# Patient Record
Sex: Female | Born: 1940 | Race: White | Hispanic: No | Smoking: Never smoker
Health system: Southern US, Community
[De-identification: ages and names within clinical notes are randomized; demographics above are authoritative.]

## PROBLEM LIST (undated history)

## (undated) DIAGNOSIS — I639 Cerebral infarction, unspecified: Secondary | ICD-10-CM

## (undated) DIAGNOSIS — IMO0001 Reserved for inherently not codable concepts without codable children: Secondary | ICD-10-CM

## (undated) DIAGNOSIS — I10 Essential (primary) hypertension: Secondary | ICD-10-CM

## (undated) DIAGNOSIS — K219 Gastro-esophageal reflux disease without esophagitis: Secondary | ICD-10-CM

## (undated) DIAGNOSIS — I2510 Atherosclerotic heart disease of native coronary artery without angina pectoris: Secondary | ICD-10-CM

## (undated) DIAGNOSIS — F0280 Dementia in other diseases classified elsewhere without behavioral disturbance: Secondary | ICD-10-CM

## (undated) DIAGNOSIS — G309 Alzheimer's disease, unspecified: Secondary | ICD-10-CM

## (undated) DIAGNOSIS — M1990 Unspecified osteoarthritis, unspecified site: Secondary | ICD-10-CM

## (undated) DIAGNOSIS — R262 Difficulty in walking, not elsewhere classified: Secondary | ICD-10-CM

## (undated) HISTORY — DX: Cerebral infarction, unspecified: I63.9

## (undated) HISTORY — PX: APPENDECTOMY: SHX54

## (undated) HISTORY — DX: Difficulty in walking, not elsewhere classified: R26.2

## (undated) HISTORY — PX: FINGER DEBRIDEMENT: SHX1634

---

## 2011-09-18 DIAGNOSIS — J3089 Other allergic rhinitis: Secondary | ICD-10-CM | POA: Diagnosis not present

## 2011-09-18 DIAGNOSIS — F329 Major depressive disorder, single episode, unspecified: Secondary | ICD-10-CM | POA: Diagnosis not present

## 2011-10-29 DIAGNOSIS — I10 Essential (primary) hypertension: Secondary | ICD-10-CM | POA: Diagnosis not present

## 2011-10-29 DIAGNOSIS — I700 Atherosclerosis of aorta: Secondary | ICD-10-CM | POA: Diagnosis not present

## 2011-11-04 DIAGNOSIS — IMO0001 Reserved for inherently not codable concepts without codable children: Secondary | ICD-10-CM | POA: Diagnosis not present

## 2011-11-04 DIAGNOSIS — E018 Other iodine-deficiency related thyroid disorders and allied conditions: Secondary | ICD-10-CM | POA: Diagnosis not present

## 2011-11-05 DIAGNOSIS — E119 Type 2 diabetes mellitus without complications: Secondary | ICD-10-CM | POA: Diagnosis not present

## 2011-11-05 DIAGNOSIS — N390 Urinary tract infection, site not specified: Secondary | ICD-10-CM | POA: Diagnosis not present

## 2011-11-05 DIAGNOSIS — I10 Essential (primary) hypertension: Secondary | ICD-10-CM | POA: Diagnosis not present

## 2011-11-05 DIAGNOSIS — E1169 Type 2 diabetes mellitus with other specified complication: Secondary | ICD-10-CM | POA: Diagnosis not present

## 2011-11-21 ENCOUNTER — Observation Stay (HOSPITAL_COMMUNITY)
Admission: EM | Admit: 2011-11-21 | Discharge: 2011-11-22 | DRG: 639 | Disposition: A | Discharge status: Home / Self Care | Payer: Medicare Other | Attending: Internal Medicine | Admitting: Internal Medicine

## 2011-11-21 ENCOUNTER — Encounter (HOSPITAL_COMMUNITY): Payer: Self-pay | Admitting: *Deleted

## 2011-11-21 ENCOUNTER — Other Ambulatory Visit: Payer: Self-pay

## 2011-11-21 DIAGNOSIS — Z79899 Other long term (current) drug therapy: Secondary | ICD-10-CM | POA: Diagnosis not present

## 2011-11-21 DIAGNOSIS — E118 Type 2 diabetes mellitus with unspecified complications: Secondary | ICD-10-CM | POA: Diagnosis not present

## 2011-11-21 DIAGNOSIS — E039 Hypothyroidism, unspecified: Secondary | ICD-10-CM | POA: Diagnosis present

## 2011-11-21 DIAGNOSIS — R000 Tachycardia, unspecified: Secondary | ICD-10-CM | POA: Diagnosis not present

## 2011-11-21 DIAGNOSIS — I10 Essential (primary) hypertension: Secondary | ICD-10-CM | POA: Diagnosis present

## 2011-11-21 DIAGNOSIS — G309 Alzheimer's disease, unspecified: Secondary | ICD-10-CM | POA: Diagnosis present

## 2011-11-21 DIAGNOSIS — T383X5A Adverse effect of insulin and oral hypoglycemic [antidiabetic] drugs, initial encounter: Secondary | ICD-10-CM | POA: Diagnosis present

## 2011-11-21 DIAGNOSIS — F0280 Dementia in other diseases classified elsewhere without behavioral disturbance: Secondary | ICD-10-CM | POA: Diagnosis not present

## 2011-11-21 DIAGNOSIS — E1169 Type 2 diabetes mellitus with other specified complication: Principal | ICD-10-CM | POA: Diagnosis present

## 2011-11-21 DIAGNOSIS — I498 Other specified cardiac arrhythmias: Secondary | ICD-10-CM | POA: Diagnosis not present

## 2011-11-21 DIAGNOSIS — E1165 Type 2 diabetes mellitus with hyperglycemia: Secondary | ICD-10-CM | POA: Diagnosis not present

## 2011-11-21 DIAGNOSIS — F0390 Unspecified dementia without behavioral disturbance: Secondary | ICD-10-CM | POA: Diagnosis present

## 2011-11-21 DIAGNOSIS — E860 Dehydration: Secondary | ICD-10-CM | POA: Diagnosis not present

## 2011-11-21 DIAGNOSIS — E162 Hypoglycemia, unspecified: Secondary | ICD-10-CM

## 2011-11-21 DIAGNOSIS — E119 Type 2 diabetes mellitus without complications: Secondary | ICD-10-CM | POA: Diagnosis present

## 2011-11-21 DIAGNOSIS — I2510 Atherosclerotic heart disease of native coronary artery without angina pectoris: Secondary | ICD-10-CM | POA: Diagnosis present

## 2011-11-21 HISTORY — DX: Essential (primary) hypertension: I10

## 2011-11-21 HISTORY — DX: Atherosclerotic heart disease of native coronary artery without angina pectoris: I25.10

## 2011-11-21 HISTORY — DX: Dementia in other diseases classified elsewhere, unspecified severity, without behavioral disturbance, psychotic disturbance, mood disturbance, and anxiety: F02.80

## 2011-11-21 HISTORY — DX: Gastro-esophageal reflux disease without esophagitis: K21.9

## 2011-11-21 HISTORY — DX: Reserved for inherently not codable concepts without codable children: IMO0001

## 2011-11-21 HISTORY — DX: Unspecified osteoarthritis, unspecified site: M19.90

## 2011-11-21 HISTORY — DX: Alzheimer's disease, unspecified: G30.9

## 2011-11-21 LAB — CREATININE, SERUM
GFR calc Af Amer: 73 mL/min — ABNORMAL LOW (ref >90)
GFR calc non Af Amer: 63 mL/min — ABNORMAL LOW (ref >90)

## 2011-11-21 LAB — URINALYSIS, ROUTINE W REFLEX MICROSCOPIC
Glucose, UA: NEGATIVE mg/dL
Hgb urine dipstick: NEGATIVE
Protein, ur: NEGATIVE mg/dL
Specific Gravity, Urine: >1.03 — ABNORMAL HIGH (ref 1.005–1.030)

## 2011-11-21 LAB — BASIC METABOLIC PANEL
CO2: 26 mEq/L (ref 19–32)
Chloride: 96 mEq/L (ref 96–112)
GFR calc non Af Amer: 54 mL/min — ABNORMAL LOW (ref >90)
Glucose, Bld: 114 mg/dL — ABNORMAL HIGH (ref 70–99)
Potassium: 4 mEq/L (ref 3.5–5.1)
Sodium: 135 mEq/L (ref 135–145)

## 2011-11-21 LAB — CBC
HCT: 41.6 % (ref 36.0–46.0)
Hemoglobin: 14 g/dL (ref 12.0–15.0)
Hemoglobin: 12.5 g/dL (ref 12.0–15.0)
MCH: 28.7 pg (ref 26.0–34.0)
MCH: 27.9 pg (ref 26.0–34.0)
MCV: 85.4 fL (ref 78.0–100.0)
MCV: 85 fL (ref 78.0–100.0)
RBC: 4.87 MIL/uL (ref 3.87–5.11)
RBC: 4.48 MIL/uL (ref 3.87–5.11)
WBC: 9.6 10*3/uL (ref 4.0–10.5)

## 2011-11-21 LAB — GLUCOSE, CAPILLARY
Glucose-Capillary: 95 mg/dL (ref 70–99)
Glucose-Capillary: 53 mg/dL — ABNORMAL LOW (ref 70–99)
Glucose-Capillary: 194 mg/dL — ABNORMAL HIGH (ref 70–99)

## 2011-11-21 LAB — URINE MICROSCOPIC-ADD ON

## 2011-11-21 LAB — D-DIMER, QUANTITATIVE: D-Dimer, Quant: 0.5 ug/mL-FEU — ABNORMAL HIGH (ref 0.00–0.48)

## 2011-11-21 LAB — DIFFERENTIAL
Eosinophils Absolute: 0.1 10*3/uL (ref 0.0–0.7)
Lymphs Abs: 2 10*3/uL (ref 0.7–4.0)
Monocytes Relative: 5 % (ref 3–12)
Neutrophils Relative %: 78 % — ABNORMAL HIGH (ref 43–77)

## 2011-11-21 LAB — LACTIC ACID, PLASMA: Lactic Acid, Venous: 1.2 mmol/L (ref 0.5–2.2)

## 2011-11-21 MED ORDER — ONDANSETRON HCL 4 MG PO TABS: 4.0000 mg | ORAL_TABLET | Freq: Four times a day (QID) | ORAL | Status: DC | PRN

## 2011-11-21 MED ORDER — LEVOTHYROXINE SODIUM 50 MCG PO TABS
50.0000 ug | ORAL_TABLET | Freq: Every day | ORAL | Status: DC
  Administered 2011-11-22: 50 ug via ORAL
  Filled 2011-11-21: qty 1

## 2011-11-21 MED ORDER — SODIUM CHLORIDE 0.9 % IJ SOLN: 3.0000 mL | Freq: Two times a day (BID) | INTRAMUSCULAR | Status: DC

## 2011-11-21 MED ORDER — CLOPIDOGREL BISULFATE 75 MG PO TABS
75.0000 mg | ORAL_TABLET | Freq: Every day | ORAL | Status: DC
  Administered 2011-11-22: 75 mg via ORAL
  Filled 2011-11-21: qty 1

## 2011-11-21 MED ORDER — DEXTROSE 50 % IV SOLN
25.0000 g | Freq: Once | INTRAVENOUS | Status: AC
  Administered 2011-11-21: 25 g via INTRAVENOUS

## 2011-11-21 MED ORDER — DEXTROSE 50 % IV SOLN
INTRAVENOUS | Status: AC
  Filled 2011-11-21: qty 50

## 2011-11-21 MED ORDER — PROPRANOLOL HCL 20 MG PO TABS
40.0000 mg | ORAL_TABLET | Freq: Two times a day (BID) | ORAL | Status: DC
  Administered 2011-11-21: 40 mg via ORAL
  Filled 2011-11-21: qty 2

## 2011-11-21 MED ORDER — LISINOPRIL 10 MG PO TABS: 40.0000 mg | ORAL_TABLET | Freq: Every day | ORAL | Status: DC

## 2011-11-21 MED ORDER — SODIUM CHLORIDE 0.9 % IV BOLUS (SEPSIS)
500.0000 mL | Freq: Once | INTRAVENOUS | Status: AC
  Administered 2011-11-21: 500 mL via INTRAVENOUS

## 2011-11-21 MED ORDER — ACETAMINOPHEN 650 MG RE SUPP: 650.0000 mg | Freq: Four times a day (QID) | RECTAL | Status: DC | PRN

## 2011-11-21 MED ORDER — PANTOPRAZOLE SODIUM 40 MG PO TBEC: 80.0000 mg | DELAYED_RELEASE_TABLET | Freq: Every day | ORAL | Status: DC

## 2011-11-21 MED ORDER — METOPROLOL TARTRATE 1 MG/ML IV SOLN
5.0000 mg | Freq: Four times a day (QID) | INTRAVENOUS | Status: DC | PRN
  Administered 2011-11-21: 5 mg via INTRAVENOUS
  Filled 2011-11-21: qty 5

## 2011-11-21 MED ORDER — ONDANSETRON HCL 4 MG/2ML IJ SOLN: 4.0000 mg | Freq: Four times a day (QID) | INTRAMUSCULAR | Status: DC | PRN

## 2011-11-21 MED ORDER — ENOXAPARIN SODIUM 40 MG/0.4ML ~~LOC~~ SOLN
40.0000 mg | SUBCUTANEOUS | Status: DC
  Administered 2011-11-21: 40 mg via SUBCUTANEOUS
  Filled 2011-11-21: qty 0.4

## 2011-11-21 MED ORDER — ACETAMINOPHEN 325 MG PO TABS: 650.0000 mg | ORAL_TABLET | Freq: Four times a day (QID) | ORAL | Status: DC | PRN

## 2011-11-21 MED ORDER — DEXTROSE-NACL 5-0.9 % IV SOLN
INTRAVENOUS | Status: DC
  Administered 2011-11-21: 19:00:00 via INTRAVENOUS

## 2011-11-21 NOTE — ED Notes (Signed)
Crackers and water given to pt.  Ambulated pt to bathroom without difficulty.

## 2011-11-21 NOTE — H&P (Signed)
PCP:   No primary provider on file.   Chief Complaint:  Hypoglycemia  HPI: This is a very pleasant 71 year old female with past medical history diabetes, hypertension, coronary artery disease, dementia, hypothyroidism. Patient has recently moved down here from [place]. She does not have a primary care physician at this time. She was noted to be lethargic at home. Her blood sugar was checked and found to be low in the 50s. He subsequently trended down to the 30s. She is brought to the ER for evaluation and received intravenous dextrose. Her blood sugar initially improved but then subsequently began to drop again. She denies any fever, dysuria, vomiting, diarrhea. Her family reports that she was recently admitted in [place] approximately 2 weeks ago for a urinary tract infection. She was also noted to have high sugars at that time. They are not sure whether any new diabetic medicines were started at that time. The patient otherwise does not have any other complaints. She will be admitted for observation for hypoglycemia.  Allergies:  No Known Allergies    Past Medical History  Diagnosis Date  . Diabetes mellitus   . Hypertension   . Reflux   . Coronary artery disease   . Alzheimer disease   . Arthritis     History reviewed. No pertinent past surgical history.  Prior to Admission medications   Medication Sig Start Date End Date Taking? Authorizing Provider  clopidogrel (PLAVIX) 75 MG tablet Take 75 mg by mouth daily.   Yes Historical Provider, MD  esomeprazole (NEXIUM) 40 MG capsule Take 40 mg by mouth daily.   Yes Historical Provider, MD  glimepiride (AMARYL) 4 MG tablet Take 4 mg by mouth 3 (three) times daily.   Yes Historical Provider, MD  levothyroxine (SYNTHROID, LEVOTHROID) 50 MCG tablet Take 50 mcg by mouth daily.   Yes Historical Provider, MD  lisinopril (PRINIVIL,ZESTRIL) 40 MG tablet Take 40 mg by mouth daily.   Yes Historical Provider, MD  Multiple Vitamins-Minerals  (MULTIVITAMIN WITH MINERALS) tablet Take 1 tablet by mouth daily.   Yes Historical Provider, MD  nateglinide (STARLIX) 120 MG tablet Take 120 mg by mouth 3 (three) times daily before meals.   Yes Historical Provider, MD  potassium chloride SA (K-DUR,KLOR-CON) 20 MEQ tablet Take 20 mEq by mouth daily.   Yes Historical Provider, MD  propranolol (INDERAL) 40 MG tablet Take 40 mg by mouth 2 (two) times daily.   Yes Historical Provider, MD  sitaGLIPtan-metformin (JANUMET) 50-1000 MG per tablet Take 1 tablet by mouth 2 (two) times daily.   Yes Historical Provider, MD    Social History:  reports that she has never smoked. She does not have any smokeless tobacco history on file. She reports that she does not drink alcohol or use illicit drugs.  History reviewed. No pertinent family history.  Review of Systems:  Constitutional: Denies fever, chills, diaphoresis, appetite change and fatigue.  HEENT: Denies photophobia, eye pain, redness, hearing loss, ear pain, congestion, sore throat, rhinorrhea, sneezing, mouth sores, trouble swallowing, neck pain, neck stiffness and tinnitus.   Respiratory: Denies SOB, DOE, cough, chest tightness,  and wheezing.   Cardiovascular: Denies chest pain, palpitations and leg swelling.  Gastrointestinal: Denies nausea, vomiting, abdominal pain, diarrhea, constipation, blood in stool and abdominal distention.  Genitourinary: Denies dysuria, urgency, frequency, hematuria, flank pain and difficulty urinating.  Musculoskeletal: Denies myalgias, back pain, joint swelling, arthralgias and gait problem.  Skin: Denies pallor, rash and wound.  Neurological: Denies dizziness, seizures, syncope, weakness, light-headedness, numbness  and headaches.  Hematological: Denies adenopathy. Easy bruising, personal or family bleeding history  Psychiatric/Behavioral: Denies suicidal ideation, mood changes, confusion, nervousness, sleep disturbance and agitation   Physical Exam: Blood pressure  109/78, pulse 133, temperature 98.3 F (36.8 C), temperature source Oral, resp. rate 16, SpO2 100.00%. General: Patient is sitting up in a chair, does not appear to be in any acute distress, HEENT: Normocephalic, atraumatic, mucous membranes are moist Chest: Clear to auscultation bilaterally Cardiac: S1, S2, tachycardic Abdomen: Soft, nontender, nondistended, bowel sounds are active Extremities: No cyanosis, clubbing, or edema Neurologic: Grossly intact, nonfocal   Labs on Admission:  Results for orders placed during the hospital encounter of 11/21/11 (from the past 48 hour(s))  GLUCOSE, CAPILLARY     Status: Normal   Collection Time   11/21/11  1:06 PM      Component Value Range Comment   Glucose-Capillary 85  70 - 99 (mg/dL)    Comment 1 Notify RN     BASIC METABOLIC PANEL     Status: Abnormal   Collection Time   11/21/11  1:49 PM      Component Value Range Comment   Sodium 135  135 - 145 (mEq/L)    Potassium 4.0  3.5 - 5.1 (mEq/L)    Chloride 96  96 - 112 (mEq/L)    CO2 26  19 - 32 (mEq/L)    Glucose, Bld 114 (*) 70 - 99 (mg/dL)    BUN 28 (*) 6 - 23 (mg/dL)    Creatinine, Ser 4.69  0.50 - 1.10 (mg/dL)    Calcium 9.7  8.4 - 10.5 (mg/dL)    GFR calc non Af Amer 54 (*) >90 (mL/min)    GFR calc Af Amer 62 (*) >90 (mL/min)   CBC     Status: Abnormal   Collection Time   11/21/11  1:49 PM      Component Value Range Comment   WBC 13.1 (*) 4.0 - 10.5 (K/uL)    RBC 4.87  3.87 - 5.11 (MIL/uL)    Hemoglobin 14.0  12.0 - 15.0 (g/dL)    HCT 62.9  52.8 - 41.3 (%)    MCV 85.4  78.0 - 100.0 (fL)    MCH 28.7  26.0 - 34.0 (pg)    MCHC 33.7  30.0 - 36.0 (g/dL)    RDW 24.4  01.0 - 27.2 (%)    Platelets 230  150 - 400 (K/uL)   DIFFERENTIAL     Status: Abnormal   Collection Time   11/21/11  1:49 PM      Component Value Range Comment   Neutrophils Relative 78 (*) 43 - 77 (%)    Neutro Abs 10.3 (*) 1.7 - 7.7 (K/uL)    Lymphocytes Relative 15  12 - 46 (%)    Lymphs Abs 2.0  0.7 - 4.0 (K/uL)     Monocytes Relative 5  3 - 12 (%)    Monocytes Absolute 0.7  0.1 - 1.0 (K/uL)    Eosinophils Relative 1  0 - 5 (%)    Eosinophils Absolute 0.1  0.0 - 0.7 (K/uL)    Basophils Relative 0  0 - 1 (%)    Basophils Absolute 0.0  0.0 - 0.1 (K/uL)   URINALYSIS, ROUTINE W REFLEX MICROSCOPIC     Status: Abnormal   Collection Time   11/21/11  2:25 PM      Component Value Range Comment   Color, Urine YELLOW  YELLOW     APPearance  CLEAR  CLEAR     Specific Gravity, Urine >1.030 (*) 1.005 - 1.030     pH 5.5  5.0 - 8.0     Glucose, UA NEGATIVE  NEGATIVE (mg/dL)    Hgb urine dipstick NEGATIVE  NEGATIVE     Bilirubin Urine NEGATIVE  NEGATIVE     Ketones, ur NEGATIVE  NEGATIVE (mg/dL)    Protein, ur NEGATIVE  NEGATIVE (mg/dL)    Urobilinogen, UA 0.2  0.0 - 1.0 (mg/dL)    Nitrite NEGATIVE  NEGATIVE     Leukocytes, UA SMALL (*) NEGATIVE    URINE MICROSCOPIC-ADD ON     Status: Abnormal   Collection Time   11/21/11  2:25 PM      Component Value Range Comment   Squamous Epithelial / LPF FEW (*) RARE     WBC, UA 7-10  <3 (WBC/hpf)    Bacteria, UA FEW (*) RARE    GLUCOSE, CAPILLARY     Status: Normal   Collection Time   11/21/11  2:51 PM      Component Value Range Comment   Glucose-Capillary 95  70 - 99 (mg/dL)   GLUCOSE, CAPILLARY     Status: Abnormal   Collection Time   11/21/11  4:15 PM      Component Value Range Comment   Glucose-Capillary 53 (*) 70 - 99 (mg/dL)   GLUCOSE, CAPILLARY     Status: Abnormal   Collection Time   11/21/11  5:21 PM      Component Value Range Comment   Glucose-Capillary 173 (*) 70 - 99 (mg/dL)     Radiological Exams on Admission: No results found.  Assessment/Plan Principal Problem:  *Hypoglycemia Active Problems:  Diabetes mellitus  Dementia  Hypertension  Hypothyroidism  Tachycardia  Plan:  #1 hypoglycemia. Likely related to the patient's oral hypoglycemic agents. We will hold these for now and check serial blood sugars. Patient will be placed on a D5  infusion since her blood sugars dropped after intravenous dextrose push. She does not appear toxic. There is no obvious source of infection. We will check TSH, and hemoglobin A1c.  #2 tachycardia. Unclear etiology. Patient does not appear to be clinically dehydrated. This may be since she has not taken her beta blocker today. We will also check a d-dimer, cycle cardiac markers, monitor on telemetry.  #3. Hypertension. Continue current medications.  #4 disposition. If the patient remains stable overnight and her blood sugars improve, then she may possibly be discharged home tomorrow.  Time Spent on Admission:  Andras Grunewald [facility] Pager: [phone] 11/21/2011, 5:44 PM

## 2011-11-21 NOTE — ED Notes (Signed)
Report attempted to [facility] on 300

## 2011-11-21 NOTE — ED Notes (Signed)
Dr. Kerry Hough here to assess pt

## 2011-11-21 NOTE — Progress Notes (Signed)
Notified Dr. Onalee Hua for patient's slightly increased lab value ( D-Dimer).  Patient is in no pain and has no shortness of breath at this time.  Family at bedside.  Received no new orders, will continue to monitor patient.

## 2011-11-21 NOTE — ED Notes (Signed)
CBG 53 

## 2011-11-21 NOTE — ED Notes (Addendum)
BS 50 this am,.  Given peanut butter  on bread and orange juice and bs went up to 64

## 2011-11-21 NOTE — ED Provider Notes (Signed)
History   This chart was scribed for Kristin Gaskins, MD by Brooks Sailors. The patient was seen in room APA01/APA01. Patient's care was started at 1302.   CSN: [id number]  Arrival date & time 11/21/11  1302   First MD Initiated Contact with Patient 11/21/11 1318      Chief Complaint  Patient presents with  . Hypoglycemia     HPI Kristin Grant is a 71 y.o. female who presents to the Emergency Department complaining of BIB family after blood glucose level dropped down to 34 this morning three hours ago with associated decreased responsiveness. Patient was given orange juice this morning to help which raised blood sugar and responsiveness according to family members. Patient notes experiencing cold like symptoms for the past several days and vomiting one day ago. Denies HA, chest pain, abdominal pain, diarrhea, weakness, and dizziness. States she last took diabetic medications yesterday. Patient was taken off insulin six months ago, which she had been on for 20+ years. H/o diabetes, HTN, CAD, and arthritis.  Her course is improving Nothing worsens her symptoms Taking fluids improves her symptoms    Past Medical History  Diagnosis Date  . Diabetes mellitus   . Hypertension   . Reflux   . Coronary artery disease   . Alzheimer disease   . Arthritis     History reviewed. No pertinent past surgical history.  History reviewed. No pertinent family history.  History  Substance Use Topics  . Smoking status: Never Smoker   . Smokeless tobacco: Not on file  . Alcohol Use: No    OB History    Grav Para Term Preterm Abortions TAB SAB Ect Mult Living                  Review of Systems A complete 10 system review of systems was obtained and all systems are negative except as noted in the HPI and PMH.   Allergies  Review of patient's allergies indicates no known allergies.  Home Medications  No current outpatient prescriptions on file.  BP 148/52  Pulse 130  Temp(Src) 97.7  F (36.5 C) (Oral)  Resp 17  SpO2 100% BP 109/78  Pulse 133  Temp(Src) 98.3 F (36.8 C) (Oral)  Resp 16  SpO2 100%   Physical Exam CONSTITUTIONAL: Well developed/well nourished HEAD AND FACE: Normocephalic/atraumatic EYES: EOMI/PERRL ENMT: Mucous membranes moist NECK: supple no meningeal signs SPINE:entire spine nontender CV: S1/S2 noted, no murmurs/rubs/gallops noted  Tachycardic  LUNGS: Lungs are clear to auscultation bilaterally, no apparent distress ABDOMEN: soft, nontender, no rebound or guarding GU:no cva tenderness NEURO: Pt is awake/alert, moves all extremitiesx4. Pt is ambulatory EXTREMITIES: pulses normal, full ROM SKIN: warm, color normal PSYCH: no abnormalities of mood noted    ED Course  Procedures (including critical care time) DIAGNOSTIC STUDIES: Oxygen Saturation is 100% on room air, normal by my interpretation.    COORDINATION OF CARE: 1:40 PM - Ordering blood work and EKG.  Starlix Halflife of 1.5 hours  Amaryl Halflife of 9 hours Janumet Halflife of 12 hours  She is not on insulin All of these meds were taken last night, no meds taken today  Pt well appearing at her baseline, no complaints but is tachycardic She does not have local PCP Labs ordered and IV fluids  4:30 PM IV fluids given but still tachy Still hypoglycemic and has not had meds since last night She is otherwise well appearing, nontoxic  No source of infection EKG shows sinus  tach D/w dr Kerry Hough, [facility] will admit to tele   Labs Reviewed  GLUCOSE, CAPILLARY      MDM  Nursing notes reviewed and considered in documentation All labs/vitals reviewed and considered    Date: 11/21/2011  Rate: 138  Rhythm: sinus tachycardia  QRS Axis: normal  Intervals: normal  ST/T Wave abnormalities: nonspecific ST changes  Conduction Disutrbances:none  Narrative Interpretation:   Old EKG Reviewed: none available      I personally performed the services described in this  documentation, which was scribed in my presence. The recorded information has been reviewed and considered.      Kristin Gaskins, MD 11/21/11 [phone]

## 2011-11-22 DIAGNOSIS — E1169 Type 2 diabetes mellitus with other specified complication: Secondary | ICD-10-CM | POA: Diagnosis not present

## 2011-11-22 DIAGNOSIS — E1165 Type 2 diabetes mellitus with hyperglycemia: Secondary | ICD-10-CM | POA: Diagnosis not present

## 2011-11-22 DIAGNOSIS — F0390 Unspecified dementia without behavioral disturbance: Secondary | ICD-10-CM | POA: Diagnosis not present

## 2011-11-22 DIAGNOSIS — R000 Tachycardia, unspecified: Secondary | ICD-10-CM | POA: Diagnosis not present

## 2011-11-22 LAB — BASIC METABOLIC PANEL
Chloride: 105 mEq/L (ref 96–112)
GFR calc Af Amer: >90 mL/min (ref >90)
GFR calc non Af Amer: 84 mL/min — ABNORMAL LOW (ref >90)
Potassium: 3.7 mEq/L (ref 3.5–5.1)
Sodium: 137 mEq/L (ref 135–145)

## 2011-11-22 LAB — GLUCOSE, CAPILLARY
Glucose-Capillary: 166 mg/dL — ABNORMAL HIGH (ref 70–99)
Glucose-Capillary: 163 mg/dL — ABNORMAL HIGH (ref 70–99)
Glucose-Capillary: 179 mg/dL — ABNORMAL HIGH (ref 70–99)
Glucose-Capillary: 193 mg/dL — ABNORMAL HIGH (ref 70–99)

## 2011-11-22 LAB — CARDIAC PANEL(CRET KIN+CKTOT+MB+TROPI)
CK, MB: 1.4 ng/mL (ref 0.3–4.0)
Troponin I: <0.3 ng/mL (ref <0.30)

## 2011-11-22 LAB — CBC
Hemoglobin: 11 g/dL — ABNORMAL LOW (ref 12.0–15.0)
RBC: 3.82 MIL/uL — ABNORMAL LOW (ref 3.87–5.11)
WBC: 7.7 10*3/uL (ref 4.0–10.5)

## 2011-11-22 LAB — HEMOGLOBIN A1C
Hgb A1c MFr Bld: 8.4 % — ABNORMAL HIGH (ref <5.7)
Mean Plasma Glucose: 194 mg/dL — ABNORMAL HIGH (ref <117)

## 2011-11-22 LAB — TSH: TSH: 2.468 u[IU]/mL (ref 0.350–4.500)

## 2011-11-22 MED ORDER — GLIMEPIRIDE 4 MG PO TABS: 4.0000 mg | ORAL_TABLET | Freq: Every day | ORAL | Status: DC

## 2011-11-22 MED ORDER — SITAGLIPTIN PHOS-METFORMIN HCL 50-1000 MG PO TABS: 1.0000 | ORAL_TABLET | Freq: Every day | ORAL | Status: DC

## 2011-11-22 NOTE — Discharge Summary (Signed)
Physician Discharge Summary  Patient ID: Kristin Grant MRN: [id number] DOB/AGE: 73/05/1941 73 y.o.  Admit date: 11/21/2011 Discharge date: 11/22/2011    Discharge Diagnoses:  1. Hypoglycemia secondary to diabetic medications. 2. Diabetes mellitus. 3. Hypertension. 4. Hypothyroidism. 5. Dementia.   Medication List  As of 11/22/2011  8:18 AM   TAKE these medications         clopidogrel 75 MG tablet   Commonly known as: PLAVIX   Take 75 mg by mouth daily.      esomeprazole 40 MG capsule   Commonly known as: NEXIUM   Take 40 mg by mouth daily.      glimepiride 4 MG tablet   Commonly known as: AMARYL   Take 1 tablet (4 mg total) by mouth daily with breakfast.      levothyroxine 50 MCG tablet   Commonly known as: SYNTHROID, LEVOTHROID   Take 50 mcg by mouth daily.      lisinopril 40 MG tablet   Commonly known as: PRINIVIL,ZESTRIL   Take 40 mg by mouth daily.      multivitamin with minerals tablet   Take 1 tablet by mouth daily.      nateglinide 120 MG tablet   Commonly known as: STARLIX   Take 120 mg by mouth 3 (three) times daily before meals.      potassium chloride SA 20 MEQ tablet   Commonly known as: K-DUR,KLOR-CON   Take 20 mEq by mouth daily.      propranolol 40 MG tablet   Commonly known as: INDERAL   Take 40 mg by mouth 2 (two) times daily.      sitaGLIPtan-metformin 50-1000 MG per tablet   Commonly known as: JANUMET   Take 1 tablet by mouth daily with supper.            Discharged Condition: Stable and improved.    Consults: None.  Significant Diagnostic Studies: No results found.  Lab Results: Basic Metabolic Panel:  Basename 11/22/11 0231 11/21/11 1837 11/21/11 1349  NA 137 -- 135  K 3.7 -- 4.0  CL 105 -- 96  CO2 26 -- 26  GLUCOSE 189* -- 114*  BUN 15 -- 28*  CREATININE 0.72 0.89 --  CALCIUM 8.2* -- 9.7  MG -- -- --  PHOS -- -- --   Liver Function Tests:    CBC:  Basename 11/22/11 0231 11/21/11 1837 11/21/11 1349  WBC  7.7 9.6 --  NEUTROABS -- -- 10.3*  HGB 11.0* 12.5 --  HCT 32.2* 38.1 --  MCV 84.3 85.0 --  PLT 151 185 --       Hospital Course: This 71 year old lady, who is mild dementia, presented with hypoglycemia. She is, according to her home medication list, taking Amaryl 4 mg 3 times a day and Janumet twice a day. On arrival, her blood glucose was less than 50. After being given intravenous dextrose, the blood glucoses improved but then in decreased again. Therefore, she was put on a dextrose saline drip. Her blood glucoses now been very stable and have been over 100 consistently. She feels much improved.  Discharge Exam: Blood pressure 116/67, pulse 91, temperature 97.6 F (36.4 C), temperature source Oral, resp. rate 20, height 5\' 4"  (1.626 m), weight 54.432 kg (120 lb), SpO2 96.00%. She looks systemically well. Heart sounds are present and normal. Lung fields are clear. She is alert and appears to be orientated in time place and person.  Disposition: Home. I've decreased her Amaryl to 4 mg  daily and her Janumet to one time a day. I've asked her to monitor her glucose at least twice a day. More importantly, she needs to establish a primary care physician soon as she has moved from [place] and her daughter assures me that she will soon.  Discharge Orders    Future Orders Please Complete By Expires   Diet - low sodium heart healthy      Increase activity slowly           SignedWilson Singer Pager [phone]  11/22/2011, 8:18 AM

## 2011-11-22 NOTE — Progress Notes (Signed)
Patient received discharge instructions along with follow up appointments. Patient verbalized understanding of all instructions. Patient was escorted by staff via wheelchair to vehicle. Patient discharged to home in stable condition. 

## 2011-11-22 NOTE — Progress Notes (Signed)
UR Chart Review Completed  

## 2011-12-09 ENCOUNTER — Encounter: Payer: Self-pay | Admitting: Family Medicine

## 2011-12-09 ENCOUNTER — Ambulatory Visit (INDEPENDENT_AMBULATORY_CARE_PROVIDER_SITE_OTHER): Payer: Medicare Other | Admitting: Family Medicine

## 2011-12-09 DIAGNOSIS — E039 Hypothyroidism, unspecified: Secondary | ICD-10-CM | POA: Diagnosis not present

## 2011-12-09 DIAGNOSIS — F0390 Unspecified dementia without behavioral disturbance: Secondary | ICD-10-CM

## 2011-12-09 DIAGNOSIS — R269 Unspecified abnormalities of gait and mobility: Secondary | ICD-10-CM | POA: Diagnosis not present

## 2011-12-09 DIAGNOSIS — E119 Type 2 diabetes mellitus without complications: Secondary | ICD-10-CM | POA: Diagnosis not present

## 2011-12-09 DIAGNOSIS — I10 Essential (primary) hypertension: Secondary | ICD-10-CM

## 2011-12-09 NOTE — Patient Instructions (Signed)
I will send an order for a Walker to [facility] Continue your current medications Keep checking blood sugar twice a day  Send in the Handicap tag  F/U 6  weeks, bring her blood sugars

## 2011-12-10 ENCOUNTER — Telehealth: Payer: Self-pay

## 2011-12-10 ENCOUNTER — Encounter: Payer: Self-pay | Admitting: Family Medicine

## 2011-12-10 DIAGNOSIS — R269 Unspecified abnormalities of gait and mobility: Secondary | ICD-10-CM | POA: Insufficient documentation

## 2011-12-10 NOTE — Telephone Encounter (Signed)
Sent to [facility] instead

## 2011-12-10 NOTE — Assessment & Plan Note (Addendum)
A1C not at goal however pt with recent symptomatic hypoglycemia, elderly female, if I can get her around 7.5-8 without hypoglycemia will use that as a goal. I did not discuss injectables with her or the family today In the past she appeared to have been on injectables but will review records

## 2011-12-10 NOTE — Assessment & Plan Note (Signed)
Continue current meds, at goal 

## 2011-12-10 NOTE — Assessment & Plan Note (Signed)
TSH wnl continue current dose

## 2011-12-10 NOTE — Assessment & Plan Note (Signed)
Noted, son is POA [employer] production pl[employer], Lives with daughter Dezire Turk

## 2011-12-10 NOTE — Assessment & Plan Note (Signed)
Will set patient up with a walker she does not want to use any assistive device and this is difficult because of her dementia however I believe she would be much safer with this.

## 2011-12-10 NOTE — Progress Notes (Signed)
  Subjective:    Patient ID: Kristin Grant, female    DOB: 08-05-1941, 71 y.o.   MRN: [id number]  HPI Patient here to establish care. Previous PCP and Jola Babinski. She recently moved here 3 weeks ago with her son.  Medications and history reviewed  Diabetes mellitus she was recently hospitalized at [facility] secondary to symptomatic hypoglycemia. Her January at and Amaryl were decreased. Her A1c was 8.8%. Her family members have her blood sugar.  Stroke- history of stroke in the past. She does not know the specifics of her medication however is maintained on Plavix. Also has history of coronary artery disease but no history of myocardial infarction or stent placement. She has difficulty walking and is now using a cane however does not like to.   Dementia- her husband died in 09/14/11 since then she has declined significantly per her son. They need a Handicap sticker  Review of Systems - difficult to determine   GEN- denies fatigue, fever, weight loss,weakness, recent illness CVS- denies chest pain, palpitations RESP- denies SOB, cough, wheeze ABD- denies N/V, change in stools, abd pain GU- denies dysuria, hematuria, dribbling, incontinence MSK- denies joint pain, muscle aches, injury Neuro- denies headache, dizziness, syncope, seizure activity       Objective:   Physical Exam  GEN- NAD, alert and oriented x2, elderly female  HEENT- PERRL, lazy left eye, non injected sclera, pink conjunctiva, MMM, oropharynx clear, dentures Neck- Supple, no bruit CVS- RRR, no murmur RESP-CTAB ABD-NABS,soft, NT,ND  EXT- No edema Pulses- Radial, DP- 2+ Gait- wide based, tends to drift off toward right, does not keep cane close to body, wide turns, unsteady Psych- not overly depressed or anxious       Assessment & Plan:

## 2012-01-21 ENCOUNTER — Encounter: Payer: Self-pay | Admitting: Family Medicine

## 2012-01-21 ENCOUNTER — Ambulatory Visit (INDEPENDENT_AMBULATORY_CARE_PROVIDER_SITE_OTHER): Payer: Medicare Other | Admitting: Family Medicine

## 2012-01-21 DIAGNOSIS — F0390 Unspecified dementia without behavioral disturbance: Secondary | ICD-10-CM | POA: Diagnosis not present

## 2012-01-21 DIAGNOSIS — E119 Type 2 diabetes mellitus without complications: Secondary | ICD-10-CM | POA: Diagnosis not present

## 2012-01-21 DIAGNOSIS — I10 Essential (primary) hypertension: Secondary | ICD-10-CM | POA: Diagnosis not present

## 2012-01-21 DIAGNOSIS — R269 Unspecified abnormalities of gait and mobility: Secondary | ICD-10-CM | POA: Diagnosis not present

## 2012-01-21 MED ORDER — ESOMEPRAZOLE MAGNESIUM 40 MG PO CPDR: 40.0000 mg | DELAYED_RELEASE_CAPSULE | Freq: Every day | ORAL | Status: DC

## 2012-01-21 MED ORDER — HYDROCHLOROTHIAZIDE 25 MG PO TABS: 25.0000 mg | ORAL_TABLET | Freq: Every day | ORAL | Status: DC

## 2012-01-21 NOTE — Assessment & Plan Note (Signed)
Her A1c goals between 7 and 8%. I believe her dinners or 2 late in the evening and then she is checking her blood sugars. She is a very brittle diabetic and has been hospitalized for hypoglycemia before. At this time I would not change her medication she appears to be doing well her appetite is improving her weight is up some. She will monitor what she is eating

## 2012-01-21 NOTE — Progress Notes (Signed)
  Subjective:    Patient ID: Kristin Grant, female    DOB: July 14, 1941, 71 y.o.   MRN: [id number]  HPI  Patient here for routine followup visit. She has no concerns today she is doing well. Medications and history reviewed Diabetes mellitus fasting blood sugars have ranged from 99-245. Evening blood sugars in which patient needs a very late dinner around 8:00 are from 200 to 400s highest blood sugar 530  Review of Systems GEN- denies fatigue, fever, weight loss,weakness, recent illness HEENT- denies eye drainage, change in vision, nasal discharge, CVS- denies chest pain, palpitations RESP- denies SOB, cough, wheeze ABD- denies N/V, change in stools, abd pain GU- denies dysuria, hematuria, dribbling, incontinence MSK- denies joint pain, muscle aches, injury Neuro- denies headache, dizziness, syncope, seizure activity      Objective:   Physical Exam   GEN- NAD, alert and oriented x2, elderly female , walks with cane and walker  HEENT- PERRL, lazy left eye, non injected sclera, pink conjunctiva, MMM, oropharynx clear, dentures CVS- RRR, no murmur RESP-CTAB ABD-NABS,soft, NT,ND  EXT- No edema Pulses- Radial, DP- 2+      Assessment & Plan:

## 2012-01-21 NOTE — Assessment & Plan Note (Signed)
Using walker and cane

## 2012-01-21 NOTE — Assessment & Plan Note (Signed)
unchanged

## 2012-01-21 NOTE — Assessment & Plan Note (Signed)
Blood pressure at goal continue current medication 

## 2012-01-21 NOTE — Patient Instructions (Signed)
Continue the same medications Blood pressure looks good Watch the sweets Try to have dinner earlier in the evening F/U 3 months

## 2012-02-02 ENCOUNTER — Encounter (HOSPITAL_COMMUNITY): Payer: Self-pay

## 2012-02-02 ENCOUNTER — Emergency Department (HOSPITAL_COMMUNITY)
Admission: EM | Admit: 2012-02-02 | Discharge: 2012-02-02 | Disposition: A | Discharge status: Home / Self Care | Payer: Medicare Other | Attending: Emergency Medicine | Admitting: Emergency Medicine

## 2012-02-02 DIAGNOSIS — Z8673 Personal history of transient ischemic attack (TIA), and cerebral infarction without residual deficits: Secondary | ICD-10-CM | POA: Insufficient documentation

## 2012-02-02 DIAGNOSIS — Z8739 Personal history of other diseases of the musculoskeletal system and connective tissue: Secondary | ICD-10-CM | POA: Insufficient documentation

## 2012-02-02 DIAGNOSIS — R112 Nausea with vomiting, unspecified: Secondary | ICD-10-CM | POA: Diagnosis not present

## 2012-02-02 DIAGNOSIS — E119 Type 2 diabetes mellitus without complications: Secondary | ICD-10-CM | POA: Diagnosis not present

## 2012-02-02 DIAGNOSIS — I2510 Atherosclerotic heart disease of native coronary artery without angina pectoris: Secondary | ICD-10-CM | POA: Diagnosis not present

## 2012-02-02 DIAGNOSIS — F0280 Dementia in other diseases classified elsewhere without behavioral disturbance: Secondary | ICD-10-CM | POA: Diagnosis not present

## 2012-02-02 DIAGNOSIS — Z79899 Other long term (current) drug therapy: Secondary | ICD-10-CM | POA: Diagnosis not present

## 2012-02-02 DIAGNOSIS — G309 Alzheimer's disease, unspecified: Secondary | ICD-10-CM | POA: Insufficient documentation

## 2012-02-02 DIAGNOSIS — I10 Essential (primary) hypertension: Secondary | ICD-10-CM | POA: Diagnosis not present

## 2012-02-02 LAB — BASIC METABOLIC PANEL
CO2: 27 mEq/L (ref 19–32)
Calcium: 9.3 mg/dL (ref 8.4–10.5)
Creatinine, Ser: 0.61 mg/dL (ref 0.50–1.10)

## 2012-02-02 LAB — CBC
MCH: 28.7 pg (ref 26.0–34.0)
MCV: 84.8 fL (ref 78.0–100.0)
Platelets: 155 10*3/uL (ref 150–400)
RDW: 13.6 % (ref 11.5–15.5)
WBC: 14.7 10*3/uL — ABNORMAL HIGH (ref 4.0–10.5)

## 2012-02-02 LAB — DIFFERENTIAL
Basophils Absolute: 0 10*3/uL (ref 0.0–0.1)
Lymphs Abs: 1.2 10*3/uL (ref 0.7–4.0)
Monocytes Absolute: 0.6 10*3/uL (ref 0.1–1.0)
Monocytes Relative: 4 % (ref 3–12)
WBC Morphology: INCREASED

## 2012-02-02 LAB — URINALYSIS, ROUTINE W REFLEX MICROSCOPIC
Bilirubin Urine: NEGATIVE
Ketones, ur: 15 mg/dL — AB
Nitrite: NEGATIVE
Urobilinogen, UA: 0.2 mg/dL (ref 0.0–1.0)

## 2012-02-02 MED ORDER — SODIUM CHLORIDE 0.9 % IV BOLUS (SEPSIS)
500.0000 mL | Freq: Once | INTRAVENOUS | Status: AC
  Administered 2012-02-02: 500 mL via INTRAVENOUS

## 2012-02-02 MED ORDER — ONDANSETRON HCL 4 MG PO TABS: 4.0000 mg | ORAL_TABLET | Freq: Four times a day (QID) | ORAL | Status: AC

## 2012-02-02 MED ORDER — ONDANSETRON HCL 4 MG/2ML IJ SOLN
4.0000 mg | Freq: Once | INTRAMUSCULAR | Status: AC
  Administered 2012-02-02: 4 mg via INTRAVENOUS
  Filled 2012-02-02: qty 2

## 2012-02-02 NOTE — ED Notes (Signed)
Pt states she had some vomiting yesterday. Vomited twice this morning. Denies other symptoms.

## 2012-02-02 NOTE — ED Notes (Signed)
Pt assisted to bathroom to obtain urine. Pt unable to void. Pt assisted back to bed. Pt has staggering gate

## 2012-02-02 NOTE — Discharge Instructions (Signed)
B.R.A.T. Diet Your doctor has recommended the B.R.A.T. diet for you or your child until the condition improves. This is often used to help control diarrhea and vomiting symptoms. If you or your child can tolerate clear liquids, you may have:  Bananas.   Rice.   Applesauce.   Toast (and other simple starches such as crackers, potatoes, noodles).  Be sure to avoid dairy products, meats, and fatty foods until symptoms are better. Fruit juices such as apple, grape, and prune juice can make diarrhea worse. Avoid these. Continue this diet for 2 days or as instructed by your caregiver. Document Released: 08/12/2005 Document Revised: 08/01/2011 Document Reviewed: 01/29/2007 ExitCare Patient Information 2012 [employer].Nausea and Vomiting Nausea means you feel sick to your stomach. Throwing up (vomiting) is a reflex where stomach contents come out of your mouth. HOME CARE   Take medicine as told by your doctor.   Do not force yourself to eat. However, you do need to drink fluids.   If you feel like eating, eat a normal diet as told by your doctor.   Eat rice, wheat, potatoes, bread, lean meats, yogurt, fruits, and vegetables.   Avoid high-fat foods.   Drink enough fluids to keep your pee (urine) clear or pale yellow.   Ask your doctor how to replace body fluid losses (rehydrate). Signs of body fluid loss (dehydration) include:   Feeling very thirsty.   Dry lips and mouth.   Feeling dizzy.   Dark pee.   Peeing less than normal.   Feeling confused.   Fast breathing or heart rate.  GET HELP RIGHT AWAY IF:   You have blood in your throw up.   You have black or bloody poop (stool).   You have a bad headache or stiff neck.   You feel confused.   You have bad belly (abdominal) pain.   You have chest pain or trouble breathing.   You do not pee at least once every 8 hours.   You have cold, clammy skin.   You keep throwing up after 24 to 48 hours.   You have a fever.    MAKE SURE YOU:   Understand these instructions.   Will watch your condition.   Will get help right away if you are not doing well or get worse.  Document Released: 01/29/2008 Document Revised: 08/01/2011 Document Reviewed: 01/11/2011 ExitCare Patient Information 2012 [employer]. 

## 2012-02-02 NOTE — ED Provider Notes (Signed)
History     CSN: [id number]  Arrival date & time 02/02/12  5621   First MD Initiated Contact with Patient 02/02/12 [phone]      Chief Complaint  Patient presents with  . Emesis    (Consider location/radiation/quality/duration/timing/severity/associated sxs/prior treatment) HPI Comments: Patient c/o vomiting yesterday and twice again on the morning prior to ED arrival.  Daughter states she was concerned because she was unable to keep down her medications yesterday.  Patient denies abd pain, fever, chills, sweats,back pain, chest pain or shortness of breath.    Patient is a 71 y.o. female presenting with vomiting. The history is provided by the patient (daughter).  Emesis  This is a new problem. The current episode started yesterday. The problem occurs 2 to 4 times per day. The problem has been gradually improving. The emesis has an appearance of stomach contents. There has been no fever. Pertinent negatives include no abdominal pain, no arthralgias, no chills, no cough, no diarrhea, no fever, no headaches, no myalgias and no URI.    Past Medical History  Diagnosis Date  . Diabetes mellitus   . Hypertension   . Reflux   . Coronary artery disease   . Alzheimer disease   . Arthritis   . Stroke   . Difficulty walking     History reviewed. No pertinent past surgical history.  Family History  Problem Relation Age of Onset  . Hypertension Mother   . Heart disease Mother   . Hypertension Father   . Heart disease Father   . Hypertension Sister   . Heart disease Sister   . Cancer Sister     BREAST   . Hypertension Brother   . Heart disease Brother     History  Substance Use Topics  . Smoking status: Never Smoker   . Smokeless tobacco: Not on file  . Alcohol Use: No    OB History    Grav Para Term Preterm Abortions TAB SAB Ect Mult Living                  Review of Systems  Constitutional: Positive for appetite change. Negative for fever, chills and activity change.    HENT: Negative for neck pain and neck stiffness.   Respiratory: Negative for cough, chest tightness and shortness of breath.   Cardiovascular: Negative for chest pain and palpitations.  Gastrointestinal: Positive for nausea and vomiting. Negative for abdominal pain, diarrhea, constipation, blood in stool and abdominal distention.  Genitourinary: Negative for dysuria, flank pain and difficulty urinating.  Musculoskeletal: Negative for myalgias, back pain and arthralgias.  Skin: Negative.   Neurological: Negative for dizziness, weakness, numbness and headaches.  All other systems reviewed and are negative.    Allergies  Review of patient's allergies indicates no known allergies.  Home Medications   Current Outpatient Rx  Name Route Sig Dispense Refill  . ACETAMINOPHEN 500 MG PO TABS Oral Take 500 mg by mouth every 6 (six) hours as needed. For pain/fever    . BISMUTH SUBSALICYLATE 262 MG/15ML PO SUSP Oral Take 15 mLs by mouth every 6 (six) hours as needed. For upset stomach    . CLOPIDOGREL BISULFATE 75 MG PO TABS Oral Take 75 mg by mouth daily.    Marland Kitchen ESOMEPRAZOLE MAGNESIUM 40 MG PO CPDR Oral Take 1 capsule (40 mg total) by mouth daily. 90 capsule 1  . GLIMEPIRIDE 4 MG PO TABS Oral Take 1 tablet (4 mg total) by mouth daily with breakfast.    .  LEVOTHYROXINE SODIUM 50 MCG PO TABS Oral Take 50 mcg by mouth daily.    Marland Kitchen LISINOPRIL 40 MG PO TABS Oral Take 40 mg by mouth daily.    . ADULT MULTIVITAMIN W/MINERALS CH Oral Take 1 tablet by mouth daily.    Marland Kitchen NATEGLINIDE 120 MG PO TABS Oral Take 120 mg by mouth 3 (three) times daily before meals.    Marland Kitchen POTASSIUM CHLORIDE CRYS ER 20 MEQ PO TBCR Oral Take 20 mEq by mouth daily.    Marland Kitchen PROPRANOLOL HCL 40 MG PO TABS Oral Take 40 mg by mouth 2 (two) times daily.    Marland Kitchen SITAGLIPTIN-METFORMIN HCL 50-1000 MG PO TABS Oral Take 1 tablet by mouth daily with supper.      BP 140/70  Pulse 95  Temp(Src) 98.2 F (36.8 C) (Oral)  Resp 20  Wt 140 lb (63.504 kg)   SpO2 97%  Physical Exam  Nursing note and vitals reviewed. Constitutional: She is oriented to person, place, and time. She appears well-developed and well-nourished. No distress.  HENT:  Head: Normocephalic and atraumatic.  Eyes: EOM are normal. Pupils are equal, round, and reactive to light.  Neck: Normal range of motion. Neck supple. No JVD present. No thyromegaly present.  Cardiovascular: Normal rate, regular rhythm, normal heart sounds and intact distal pulses.   No murmur heard. Pulmonary/Chest: Effort normal and breath sounds normal. No respiratory distress.  Abdominal: Soft. Bowel sounds are normal. She exhibits no distension and no mass. There is no tenderness. There is no rebound and no guarding.  Musculoskeletal: Normal range of motion. She exhibits no edema.  Neurological: She is alert and oriented to person, place, and time. She exhibits normal muscle tone. Coordination normal.  Skin: Skin is warm and dry.    ED Course  Procedures (including critical care time)  Labs Reviewed  BASIC METABOLIC PANEL - Abnormal; Notable for the following:    Glucose, Bld 287 (*)    GFR calc non Af Amer 88 (*)    All other components within normal limits  CBC - Abnormal; Notable for the following:    WBC 14.7 (*)    HCT 35.7 (*)    All other components within normal limits  DIFFERENTIAL - Abnormal; Notable for the following:    Neutrophils Relative 88 (*)    Lymphocytes Relative 8 (*)    Neutro Abs 12.9 (*)    All other components within normal limits  URINALYSIS, ROUTINE W REFLEX MICROSCOPIC - Abnormal; Notable for the following:    Specific Gravity, Urine >1.030 (*)    Glucose, UA >1000 (*)    Ketones, ur 15 (*)    All other components within normal limits  URINE MICROSCOPIC-ADD ON - Abnormal; Notable for the following:    Squamous Epithelial / LPF FEW (*)    All other components within normal limits    Urine culture pending    MDM    Previous medical charts, nursing  notes and vitals signs from this visit were reviewed by me   All laboratory results and/or imaging results performed on this visit, if applicable, were reviewed by me and discussed with the patient and/or parent as well as recommendation for follow-up    MEDICATIONS GIVEN IN ED:  zofran IV  Patient is resting comfortably. She feels better and is requesting to go home. Has received IV medication and fluids. She is tolerated by mouth challenge. Abdomen remains soft and non-tender, no peritoneal signs.  Patient is nontoxic appearing. Ambulates with  steady gait.  Symptoms are likely related to gastroenteritis.  Patient did not take her a.m. medications due to the vomiting so I have recommended that she take her medications when she gets home. She agrees to close followup with her primary care physician if needed. Patient has also been seen by the EDP and her care plan and laboratory findings have been discussed.    PRESCRIPTIONS GIVEN AT DISCHARGE: zofran    Pt stable in ED with no significant deterioration in condition. Pt feels improved after observation and/or treatment in ED. Patient / Family / Caregiver understand and agree with initial ED impression and plan with expectations set for ED visit.  Patient agrees to return to ED for any worsening symptoms          Rashanna Christiana L. Laneka Mcgrory, [place] 02/07/12 1342

## 2012-02-03 LAB — URINE CULTURE
Colony Count: NO GROWTH
Culture  Setup Time: 201306091927

## 2012-02-04 ENCOUNTER — Other Ambulatory Visit: Payer: Self-pay | Admitting: Family Medicine

## 2012-02-04 MED ORDER — SITAGLIPTIN PHOS-METFORMIN HCL 50-1000 MG PO TABS: 1.0000 | ORAL_TABLET | Freq: Every day | ORAL | Status: DC

## 2012-02-04 MED ORDER — CLOPIDOGREL BISULFATE 75 MG PO TABS: 75.0000 mg | ORAL_TABLET | Freq: Every day | ORAL | Status: DC

## 2012-02-04 MED ORDER — LISINOPRIL 40 MG PO TABS: 40.0000 mg | ORAL_TABLET | Freq: Every day | ORAL | Status: DC

## 2012-02-08 NOTE — ED Provider Notes (Signed)
Medical screening examination/treatment/procedure(s) were performed by non-physician practitioner and as supervising physician I was immediately available for consultation/collaboration.  Liany Mumpower, MD 02/08/12 1318 

## 2012-02-24 ENCOUNTER — Other Ambulatory Visit: Payer: Self-pay

## 2012-02-24 ENCOUNTER — Telehealth: Payer: Self-pay | Admitting: Family Medicine

## 2012-02-24 MED ORDER — PROPRANOLOL HCL 40 MG PO TABS: 40.0000 mg | ORAL_TABLET | Freq: Two times a day (BID) | ORAL | Status: DC

## 2012-02-24 NOTE — Telephone Encounter (Signed)
Med refilled.

## 2012-03-21 ENCOUNTER — Other Ambulatory Visit: Payer: Self-pay | Admitting: Family Medicine

## 2012-04-08 ENCOUNTER — Other Ambulatory Visit: Payer: Self-pay | Admitting: Family Medicine

## 2012-04-14 ENCOUNTER — Telehealth: Payer: Self-pay | Admitting: Family Medicine

## 2012-04-14 ENCOUNTER — Other Ambulatory Visit: Payer: Self-pay

## 2012-04-14 NOTE — Telephone Encounter (Signed)
Called med into pharmacy as e-rx on 8/15

## 2012-04-23 ENCOUNTER — Ambulatory Visit (INDEPENDENT_AMBULATORY_CARE_PROVIDER_SITE_OTHER): Payer: Medicare Other | Admitting: Family Medicine

## 2012-04-23 ENCOUNTER — Encounter: Payer: Self-pay | Admitting: Family Medicine

## 2012-04-23 VITALS — BP 124/66 | HR 100 | Resp 18 | Ht 63.5 in | Wt 136.0 lb

## 2012-04-23 DIAGNOSIS — I10 Essential (primary) hypertension: Secondary | ICD-10-CM | POA: Diagnosis not present

## 2012-04-23 DIAGNOSIS — E039 Hypothyroidism, unspecified: Secondary | ICD-10-CM

## 2012-04-23 DIAGNOSIS — E119 Type 2 diabetes mellitus without complications: Secondary | ICD-10-CM | POA: Diagnosis not present

## 2012-04-23 DIAGNOSIS — F0390 Unspecified dementia without behavioral disturbance: Secondary | ICD-10-CM

## 2012-04-23 NOTE — Patient Instructions (Addendum)
No change to medications  Get the labs done fasting in the next 2-3 weeks  F/U 1st week of November

## 2012-04-24 ENCOUNTER — Encounter: Payer: Self-pay | Admitting: Family Medicine

## 2012-04-24 NOTE — Assessment & Plan Note (Signed)
Continue current medications. 

## 2012-04-24 NOTE — Assessment & Plan Note (Signed)
Brittle diabetic. No change the medications today we will obtain her A1c

## 2012-04-24 NOTE — Assessment & Plan Note (Signed)
Well-controlled no change the medication 

## 2012-04-24 NOTE — Progress Notes (Signed)
  Subjective:    Patient ID: Kristin Grant, female    DOB: April 04, 1941, 71 y.o.   MRN: [id number]  HPI Patient here to follow chronic medical problems. She's here with her granddaughter today. She has no concerns. No difficulties with medications. Medications and history reviewed DM- no hypoglycemia, denies CBG > 200, did not bring meter today   Review of Systems  GEN- denies fatigue, fever, weight loss,weakness, recent illness HEENT- denies eye drainage, change in vision, nasal discharge, CVS- denies chest pain, palpitations RESP- denies SOB, cough, wheeze ABD- denies N/V, change in stools, abd pain GU- denies dysuria, hematuria, dribbling, incontinence MSK- denies joint pain, muscle aches, injury Neuro- denies headache, dizziness, syncope, seizure activity      Objective:   Physical Exam  GEN- NAD, alert and oriented x2, elderly female , walks with cane  HEENT- PERRL, lazy left eye, non injected sclera, pink conjunctiva, MMM, oropharynx clear, dentures CVS- RRR, no murmur RESP-CTAB ABD-NABS,soft, NT,ND  EXT- No edema Pulses- Radial, DP- 2+ DM- normal monofilament, mild callus, thick toenails        Assessment & Plan:

## 2012-04-24 NOTE — Assessment & Plan Note (Signed)
Unchanged she's not having problems with agitation or outbursts.

## 2012-04-30 ENCOUNTER — Telehealth: Payer: Self-pay | Admitting: Family Medicine

## 2012-05-01 MED ORDER — NATEGLINIDE 120 MG PO TABS: 120.0000 mg | ORAL_TABLET | Freq: Three times a day (TID) | ORAL | Status: DC

## 2012-05-01 NOTE — Telephone Encounter (Signed)
Med sent.

## 2012-05-20 ENCOUNTER — Other Ambulatory Visit: Payer: Self-pay | Admitting: Family Medicine

## 2012-05-27 ENCOUNTER — Other Ambulatory Visit: Payer: Self-pay

## 2012-05-27 MED ORDER — POTASSIUM CHLORIDE CRYS ER 20 MEQ PO TBCR: 20.0000 meq | EXTENDED_RELEASE_TABLET | Freq: Every day | ORAL | Status: DC

## 2012-06-16 ENCOUNTER — Other Ambulatory Visit: Payer: Self-pay | Admitting: Family Medicine

## 2012-06-30 ENCOUNTER — Ambulatory Visit (INDEPENDENT_AMBULATORY_CARE_PROVIDER_SITE_OTHER): Payer: Medicare Other | Admitting: Family Medicine

## 2012-06-30 ENCOUNTER — Encounter: Payer: Self-pay | Admitting: Family Medicine

## 2012-06-30 VITALS — BP 142/76 | HR 88 | Resp 18 | Ht 63.5 in | Wt 138.0 lb

## 2012-06-30 DIAGNOSIS — I10 Essential (primary) hypertension: Secondary | ICD-10-CM | POA: Diagnosis not present

## 2012-06-30 DIAGNOSIS — R269 Unspecified abnormalities of gait and mobility: Secondary | ICD-10-CM | POA: Diagnosis not present

## 2012-06-30 DIAGNOSIS — Z23 Encounter for immunization: Secondary | ICD-10-CM

## 2012-06-30 DIAGNOSIS — F0390 Unspecified dementia without behavioral disturbance: Secondary | ICD-10-CM

## 2012-06-30 DIAGNOSIS — E119 Type 2 diabetes mellitus without complications: Secondary | ICD-10-CM | POA: Diagnosis not present

## 2012-06-30 DIAGNOSIS — L609 Nail disorder, unspecified: Secondary | ICD-10-CM

## 2012-06-30 NOTE — Assessment & Plan Note (Signed)
She's been doing well with her cane. Walker at times of fatigue

## 2012-06-30 NOTE — Progress Notes (Signed)
  Subjective:    Patient ID: Kristin Grant, female    DOB: [birth year], 71 y.o.   MRN: [id number]  HPI  Patient to follow chronic medical problems. She has no specific concerns. Is due for flu shot today. Her blood sugars have been 180 to 200s fasting however she has not felt ill at this level. She's taking her medications without any problems. She's been eating well and has gained another 2 pounds.  Review of Systems  GEN- denies fatigue, fever, weight loss,weakness, recent illness HEENT- denies eye drainage, change in vision, nasal discharge, CVS- denies chest pain, palpitations RESP- denies SOB, cough, wheeze ABD- denies N/V, change in stools, abd pain GU- denies dysuria, hematuria, dribbling, incontinence MSK- denies joint pain, muscle aches, injury Neuro- denies headache, dizziness, syncope, seizure activity      Objective:   Physical Exam GEN- NAD, alert and oriented x2, elderly female , walks with cane  HEENT- PERRL, lazy left eye, non injected sclera, pink conjunctiva, MMM, oropharynx clear,  CVS- RRR, no murmur RESP-CTAB ABD-NABS,soft, NT,ND  EXT- No edema Pulses- Radial, DP- 2+        Assessment & Plan:

## 2012-06-30 NOTE — Assessment & Plan Note (Signed)
Blood pressure elevated some today. No change in medication

## 2012-06-30 NOTE — Assessment & Plan Note (Signed)
Doing well no behavioral problems. She is eating well

## 2012-06-30 NOTE — Patient Instructions (Signed)
Labs to be drawn on Friday Flu shot given  Continue current medications F/U 3 months

## 2012-06-30 NOTE — Assessment & Plan Note (Addendum)
Patient is overdue for labs. He had some confusion were to get her labs drawn. I will send her for labs for this Friday fasting. Her goal is to have an A1c of around 8 Referral to podiatry.

## 2012-07-17 ENCOUNTER — Other Ambulatory Visit: Payer: Self-pay | Admitting: Family Medicine

## 2012-07-27 ENCOUNTER — Other Ambulatory Visit: Payer: Self-pay | Admitting: Family Medicine

## 2012-08-11 ENCOUNTER — Other Ambulatory Visit: Payer: Self-pay

## 2012-08-11 MED ORDER — CLOPIDOGREL BISULFATE 75 MG PO TABS: 75.0000 mg | ORAL_TABLET | Freq: Every day | ORAL | Status: DC

## 2012-08-26 ENCOUNTER — Other Ambulatory Visit: Payer: Self-pay | Admitting: Family Medicine

## 2012-08-28 ENCOUNTER — Other Ambulatory Visit: Payer: Self-pay

## 2012-08-28 MED ORDER — LISINOPRIL 40 MG PO TABS: 40.0000 mg | ORAL_TABLET | Freq: Every day | ORAL | Status: DC

## 2012-09-18 ENCOUNTER — Other Ambulatory Visit: Payer: Self-pay | Admitting: Family Medicine

## 2012-10-13 ENCOUNTER — Ambulatory Visit: Payer: Medicare Other | Admitting: Family Medicine

## 2012-10-16 ENCOUNTER — Ambulatory Visit: Payer: Medicare Other | Admitting: Family Medicine

## 2012-10-19 ENCOUNTER — Other Ambulatory Visit: Payer: Self-pay | Admitting: Family Medicine

## 2012-10-28 ENCOUNTER — Other Ambulatory Visit: Payer: Self-pay

## 2012-10-28 MED ORDER — SITAGLIPTIN PHOS-METFORMIN HCL 50-1000 MG PO TABS: 1.0000 | ORAL_TABLET | Freq: Every day | ORAL | Status: DC

## 2012-11-09 ENCOUNTER — Ambulatory Visit: Payer: Medicare Other | Admitting: Family Medicine

## 2012-11-10 ENCOUNTER — Ambulatory Visit: Payer: Medicare Other | Admitting: Family Medicine

## 2012-11-12 ENCOUNTER — Telehealth: Payer: Self-pay | Admitting: Family Medicine

## 2012-11-12 NOTE — Telephone Encounter (Signed)
Noted  

## 2012-11-15 ENCOUNTER — Other Ambulatory Visit: Payer: Self-pay | Admitting: Family Medicine

## 2012-11-15 NOTE — Telephone Encounter (Signed)
noted 

## 2012-11-18 ENCOUNTER — Telehealth: Payer: Self-pay | Admitting: Family Medicine
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
## 2012-11-27 ENCOUNTER — Other Ambulatory Visit: Payer: Self-pay | Admitting: Family Medicine

## 2012-12-01 ENCOUNTER — Other Ambulatory Visit: Payer: Self-pay | Admitting: Family Medicine

## 2013-01-11 ENCOUNTER — Other Ambulatory Visit: Payer: Self-pay | Admitting: Family Medicine

## 2013-01-11 NOTE — Telephone Encounter (Signed)
Can this be approved without ov?

## 2013-01-11 NOTE — Telephone Encounter (Signed)
Yes okay to send

## 2013-01-18 ENCOUNTER — Other Ambulatory Visit: Payer: Self-pay | Admitting: Family Medicine

## 2013-01-19 ENCOUNTER — Other Ambulatory Visit: Payer: Self-pay | Admitting: Family Medicine

## 2013-01-20 NOTE — Telephone Encounter (Signed)
Patient missed last couple of f/u appt.  Can this be refilled?

## 2013-01-20 NOTE — Telephone Encounter (Signed)
Patient has missed last couple appt.  Do you want to refill?

## 2013-01-21 NOTE — Telephone Encounter (Signed)
30 day only, needs appt

## 2013-01-21 NOTE — Telephone Encounter (Signed)
Give 30 days only, pt message to pharmacy, must schedule appt

## 2013-01-25 ENCOUNTER — Other Ambulatory Visit: Payer: Self-pay | Admitting: Family Medicine

## 2013-01-25 NOTE — Telephone Encounter (Signed)
Forwarding to new office  

## 2013-01-25 NOTE — Telephone Encounter (Signed)
Med rf °

## 2013-02-09 ENCOUNTER — Other Ambulatory Visit: Payer: Self-pay | Admitting: Family Medicine

## 2013-02-09 NOTE — Telephone Encounter (Signed)
MED REFILLED PT AWARE NEEDS TO BE SEEN BEFORE FURTHER REFILLS

## 2013-02-17 ENCOUNTER — Other Ambulatory Visit: Payer: Self-pay | Admitting: Family Medicine

## 2013-02-19 ENCOUNTER — Other Ambulatory Visit: Payer: Self-pay | Admitting: Family Medicine

## 2013-02-22 ENCOUNTER — Other Ambulatory Visit: Payer: Self-pay | Admitting: Family Medicine

## 2013-03-10 ENCOUNTER — Other Ambulatory Visit: Payer: Self-pay | Admitting: Family Medicine

## 2013-03-10 NOTE — Telephone Encounter (Signed)
Need ov been 8mos.left message with son richard to return my call to schedule appt/ss

## 2013-03-12 ENCOUNTER — Encounter: Payer: Self-pay | Admitting: Family Medicine

## 2013-03-12 NOTE — Telephone Encounter (Signed)
Letter sent to make appt.

## 2013-03-16 NOTE — Telephone Encounter (Signed)
Refilled meds for 30 days, until pt can make appt

## 2013-03-26 ENCOUNTER — Other Ambulatory Visit: Payer: Self-pay | Admitting: Family Medicine

## 2013-03-26 ENCOUNTER — Ambulatory Visit (INDEPENDENT_AMBULATORY_CARE_PROVIDER_SITE_OTHER): Payer: Medicare Other | Admitting: Family Medicine

## 2013-03-26 VITALS — BP 122/68 | HR 84 | Temp 97.9°F | Resp 20 | Wt 130.0 lb

## 2013-03-26 DIAGNOSIS — Q846 Other congenital malformations of nails: Secondary | ICD-10-CM

## 2013-03-26 DIAGNOSIS — E119 Type 2 diabetes mellitus without complications: Secondary | ICD-10-CM

## 2013-03-26 DIAGNOSIS — R300 Dysuria: Secondary | ICD-10-CM

## 2013-03-26 DIAGNOSIS — I10 Essential (primary) hypertension: Secondary | ICD-10-CM

## 2013-03-26 DIAGNOSIS — E039 Hypothyroidism, unspecified: Secondary | ICD-10-CM | POA: Diagnosis not present

## 2013-03-26 DIAGNOSIS — F0390 Unspecified dementia without behavioral disturbance: Secondary | ICD-10-CM

## 2013-03-26 LAB — URINALYSIS, ROUTINE W REFLEX MICROSCOPIC
Glucose, UA: 250 mg/dL — AB
Hgb urine dipstick: NEGATIVE
Ketones, ur: NEGATIVE mg/dL
Nitrite: NEGATIVE
Protein, ur: NEGATIVE mg/dL
Specific Gravity, Urine: 1.02 (ref 1.005–1.030)
Urobilinogen, UA: 0.2 mg/dL (ref 0.0–1.0)
pH: 5.5 (ref 5.0–8.0)

## 2013-03-26 LAB — URINALYSIS, MICROSCOPIC ONLY: Casts: NONE SEEN

## 2013-03-26 MED ORDER — LEVOTHYROXINE SODIUM 50 MCG PO TABS: ORAL_TABLET | ORAL | Status: DC

## 2013-03-26 MED ORDER — GLIMEPIRIDE 4 MG PO TABS: ORAL_TABLET | ORAL | Status: DC

## 2013-03-26 MED ORDER — PROPRANOLOL HCL 40 MG PO TABS: ORAL_TABLET | ORAL | Status: DC

## 2013-03-26 MED ORDER — SITAGLIPTIN PHOS-METFORMIN HCL 50-1000 MG PO TABS: 1.0000 | ORAL_TABLET | Freq: Every day | ORAL | Status: DC

## 2013-03-26 NOTE — Patient Instructions (Addendum)
I have stopped the starlix She should continue the Janumet and the amaryl  We will call with lab results Get the labs done fasting in the morning Check her blood sugar fasting  F/U 4 weeks for memory
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
## 2013-03-27 LAB — URINE CULTURE
Colony Count: NO GROWTH
Organism ID, Bacteria: NO GROWTH

## 2013-03-28 DIAGNOSIS — Q846 Other congenital malformations of nails: Secondary | ICD-10-CM | POA: Insufficient documentation

## 2013-03-28 NOTE — Assessment & Plan Note (Signed)
Check TFT make sure not a cause of change in memory

## 2013-03-28 NOTE — Assessment & Plan Note (Signed)
Well controllled no change to meds

## 2013-03-28 NOTE — Assessment & Plan Note (Signed)
Son noted that they think she is more confused than normal, will have her obtain labs and return for MMSE UA/Culture negative

## 2013-03-28 NOTE — Assessment & Plan Note (Signed)
Well controlled, they have had some issues getting her meds Will d/c the starlix goal is A1C < 8 On ACEI

## 2013-03-28 NOTE — Assessment & Plan Note (Signed)
Very thickened distorted nail now eroding into the palmer surface Will send to dermatology to see if this can be removed

## 2013-04-19 ENCOUNTER — Other Ambulatory Visit: Payer: Self-pay | Admitting: Family Medicine

## 2013-05-12 ENCOUNTER — Other Ambulatory Visit: Payer: Self-pay | Admitting: Family Medicine

## 2013-05-31 ENCOUNTER — Other Ambulatory Visit: Payer: Self-pay | Admitting: Family Medicine

## 2013-07-05 ENCOUNTER — Ambulatory Visit (INDEPENDENT_AMBULATORY_CARE_PROVIDER_SITE_OTHER): Payer: Medicare Other | Admitting: Family Medicine

## 2013-07-05 ENCOUNTER — Encounter: Payer: Self-pay | Admitting: Family Medicine

## 2013-07-05 VITALS — BP 110/60 | HR 78 | Temp 98.1°F | Resp 18 | Wt 123.5 lb

## 2013-07-05 DIAGNOSIS — I10 Essential (primary) hypertension: Secondary | ICD-10-CM

## 2013-07-05 DIAGNOSIS — Z23 Encounter for immunization: Secondary | ICD-10-CM | POA: Diagnosis not present

## 2013-07-05 DIAGNOSIS — L8991 Pressure ulcer of unspecified site, stage 1: Secondary | ICD-10-CM

## 2013-07-05 DIAGNOSIS — Q846 Other congenital malformations of nails: Secondary | ICD-10-CM

## 2013-07-05 DIAGNOSIS — L8990 Pressure ulcer of unspecified site, unspecified stage: Secondary | ICD-10-CM | POA: Diagnosis not present

## 2013-07-05 DIAGNOSIS — E039 Hypothyroidism, unspecified: Secondary | ICD-10-CM

## 2013-07-05 DIAGNOSIS — F0390 Unspecified dementia without behavioral disturbance: Secondary | ICD-10-CM

## 2013-07-05 DIAGNOSIS — E119 Type 2 diabetes mellitus without complications: Secondary | ICD-10-CM

## 2013-07-06 ENCOUNTER — Encounter: Payer: Self-pay | Admitting: Family Medicine

## 2013-07-06 DIAGNOSIS — L8991 Pressure ulcer of unspecified site, stage 1: Secondary | ICD-10-CM | POA: Insufficient documentation

## 2013-07-06 LAB — COMPREHENSIVE METABOLIC PANEL
AST: 11 U/L (ref 0–37)
BUN: 11 mg/dL (ref 6–23)
Calcium: 10.3 mg/dL (ref 8.4–10.5)
Chloride: 106 mEq/L (ref 96–112)
Creat: 0.94 mg/dL (ref 0.50–1.10)

## 2013-07-06 LAB — CBC WITH DIFFERENTIAL/PLATELET
Basophils Relative: 0 % (ref 0–1)
HCT: 41.9 % (ref 36.0–46.0)
Hemoglobin: 13.8 g/dL (ref 12.0–15.0)
Lymphocytes Relative: 24 % (ref 12–46)
MCHC: 32.9 g/dL (ref 30.0–36.0)
Monocytes Absolute: 0.7 10*3/uL (ref 0.1–1.0)
Monocytes Relative: 6 % (ref 3–12)
Neutro Abs: 7.4 10*3/uL (ref 1.7–7.7)

## 2013-07-06 LAB — LIPID PANEL
Cholesterol: 118 mg/dL (ref 0–200)
HDL: 50 mg/dL (ref >39)
Total CHOL/HDL Ratio: 2.4 Ratio
Triglycerides: 228 mg/dL — ABNORMAL HIGH (ref <150)
VLDL: 46 mg/dL — ABNORMAL HIGH (ref 0–40)

## 2013-07-06 NOTE — Patient Instructions (Signed)
No instructions given as Printer was down Given handwritten instructions F/U 3 months

## 2013-07-06 NOTE — Assessment & Plan Note (Signed)
She has some erythema right at the sacrum which is concerning for stage I pressure ulcer however she does wear diapers and often does not change a regular basis that this could be some underlying intertrigo as well. We discussed changing diapers on a regular basis and she can also apply some Desitin

## 2013-07-06 NOTE — Assessment & Plan Note (Signed)
She requires a 24-hour caregiver. She is now under the care of her husband's wife. They get requested a full to form. I will contact her case manager Filomena Jungling freeze regarding this

## 2013-07-06 NOTE — Assessment & Plan Note (Addendum)
Review her medications again. Her last A1c was 7.3%. She will continue on the Janumet and Amaryl New script for Glucose meter

## 2013-07-06 NOTE — Assessment & Plan Note (Signed)
Thyroid studies to be checked continue Synthroid

## 2013-07-06 NOTE — Assessment & Plan Note (Signed)
Blood pressure is well-controlled I will check nonfasting labs and she is in the office today and often has not followed up for blood work

## 2013-07-06 NOTE — Assessment & Plan Note (Signed)
She missed appointment to have this removed will refer back to dermatology

## 2013-07-06 NOTE — Progress Notes (Signed)
  Subjective:    Patient ID: Kristin Grant, female    DOB: 12/03/1940, 72 y.o.   MRN: [id number]  HPI  Patient here follow chronic medical problems. She is now under the care of her son and his wife. Her son's wife is requesting an FL2 form and she will be caring for her regular basis. Is here to have her medications check and to get followup instructions.  Diabetes mellitus-she's not been checking her blood sugars she needs a new meter to do this. She still has bottles of the Starlix which was discontinued however they do not think she's been taking this. She's not had any symptoms of hypoglycemia   She did not follow through with dermatology therefore still has the abnormal nail on her finger which needs to be removed  She still has not had her fasting labs done for her thyroid or cholesterol panel.  Medications reviewed   Review of Systems  GEN- denies fatigue, fever, weight loss,weakness, recent illness HEENT- denies eye drainage, change in vision, nasal discharge, CVS- denies chest pain, palpitations RESP- denies SOB, cough, wheeze ABD- denies N/V, change in stools, abd pain GU- denies dysuria, hematuria, dribbling, incontinence MSK- denies joint pain, muscle aches, injury Neuro- denies headache, dizziness, syncope, seizure activity      Objective:   Physical Exam GEN- NAD, alert and oriented x3, obese,  HEENT- PERRL EOMI, non icteric, MMM, poor dentition,- broken teeth in gumline, denture upper ,wearing glasses CVS- RRR, no murmur RESP-CTAB EXT- trace edema , thick toenails , Right hand 3rd digit- hypertrophic nail at tip with callus, mild TTP,deformity of finger, unable to bend at MIP Pulses- Radial, DP- 2+ Psych-normal affect and Mood        Assessment & Plan:

## 2013-07-12 ENCOUNTER — Other Ambulatory Visit: Payer: Self-pay | Admitting: Family Medicine

## 2013-07-12 DIAGNOSIS — E875 Hyperkalemia: Secondary | ICD-10-CM

## 2013-07-13 ENCOUNTER — Telehealth: Payer: Self-pay | Admitting: Family Medicine

## 2013-07-13 ENCOUNTER — Other Ambulatory Visit: Payer: Self-pay | Admitting: *Deleted

## 2013-07-13 NOTE — Telephone Encounter (Signed)
Kristin Grant is wanting to find out how she can go about getting Kristin Grant   Fax #[phone]

## 2013-07-13 NOTE — Telephone Encounter (Signed)
Called Carrie back and I faxed her FL2 form and mailed it as well to [facility] in [place]

## 2013-07-19 ENCOUNTER — Telehealth: Payer: Self-pay | Admitting: Family Medicine

## 2013-07-19 ENCOUNTER — Ambulatory Visit (INDEPENDENT_AMBULATORY_CARE_PROVIDER_SITE_OTHER): Payer: Medicare Other | Admitting: *Deleted

## 2013-07-19 DIAGNOSIS — Z111 Encounter for screening for respiratory tuberculosis: Secondary | ICD-10-CM

## 2013-07-19 MED ORDER — BLOOD GLUCOSE MONITOR SYSTEM W/DEVICE KIT: 1.0000 [IU] | PACK | Freq: Every day | Status: DC

## 2013-07-19 NOTE — Telephone Encounter (Signed)
Kristin Grant is calling again and states that [name] is needing her glucouse strips and monitor sent to [facility] call back number is  [phone]

## 2013-07-19 NOTE — Telephone Encounter (Signed)
Meds refilled.

## 2013-07-19 NOTE — Telephone Encounter (Signed)
Fax number [phone]  Lupita Leash works with [facility] for her home health care and she is needing some type of documentation faxed stating where Robbi TB was placed so that the home health care will read it. And she will fax [place] a copy of the results   Call back number is [phone]

## 2013-07-28 ENCOUNTER — Other Ambulatory Visit: Payer: Self-pay | Admitting: Family Medicine

## 2013-08-08 ENCOUNTER — Other Ambulatory Visit: Payer: Self-pay | Admitting: Family Medicine

## 2013-08-09 ENCOUNTER — Other Ambulatory Visit: Payer: Self-pay | Admitting: Family Medicine

## 2013-08-10 ENCOUNTER — Other Ambulatory Visit: Payer: Self-pay | Admitting: Family Medicine

## 2013-08-10 NOTE — Telephone Encounter (Signed)
Medication refilled per protocol. 

## 2013-08-13 ENCOUNTER — Emergency Department (HOSPITAL_COMMUNITY): Payer: Medicare Other

## 2013-08-13 ENCOUNTER — Emergency Department (HOSPITAL_COMMUNITY)
Admission: EM | Admit: 2013-08-13 | Discharge: 2013-08-13 | Disposition: A | Discharge status: Home / Self Care | Payer: Medicare Other | Attending: Emergency Medicine | Admitting: Emergency Medicine

## 2013-08-13 ENCOUNTER — Encounter (HOSPITAL_COMMUNITY): Payer: Self-pay | Admitting: Emergency Medicine

## 2013-08-13 DIAGNOSIS — Y939 Activity, unspecified: Secondary | ICD-10-CM | POA: Insufficient documentation

## 2013-08-13 DIAGNOSIS — F0280 Dementia in other diseases classified elsewhere without behavioral disturbance: Secondary | ICD-10-CM | POA: Insufficient documentation

## 2013-08-13 DIAGNOSIS — T148XXA Other injury of unspecified body region, initial encounter: Secondary | ICD-10-CM | POA: Diagnosis not present

## 2013-08-13 DIAGNOSIS — Z8673 Personal history of transient ischemic attack (TIA), and cerebral infarction without residual deficits: Secondary | ICD-10-CM | POA: Insufficient documentation

## 2013-08-13 DIAGNOSIS — Z23 Encounter for immunization: Secondary | ICD-10-CM | POA: Insufficient documentation

## 2013-08-13 DIAGNOSIS — I2510 Atherosclerotic heart disease of native coronary artery without angina pectoris: Secondary | ICD-10-CM | POA: Diagnosis not present

## 2013-08-13 DIAGNOSIS — Z7982 Long term (current) use of aspirin: Secondary | ICD-10-CM | POA: Insufficient documentation

## 2013-08-13 DIAGNOSIS — I10 Essential (primary) hypertension: Secondary | ICD-10-CM | POA: Diagnosis not present

## 2013-08-13 DIAGNOSIS — IMO0002 Reserved for concepts with insufficient information to code with codable children: Secondary | ICD-10-CM | POA: Insufficient documentation

## 2013-08-13 DIAGNOSIS — S0993XA Unspecified injury of face, initial encounter: Secondary | ICD-10-CM | POA: Diagnosis not present

## 2013-08-13 DIAGNOSIS — S0003XA Contusion of scalp, initial encounter: Secondary | ICD-10-CM | POA: Diagnosis not present

## 2013-08-13 DIAGNOSIS — Z7902 Long term (current) use of antithrombotics/antiplatelets: Secondary | ICD-10-CM | POA: Diagnosis not present

## 2013-08-13 DIAGNOSIS — Z79899 Other long term (current) drug therapy: Secondary | ICD-10-CM | POA: Diagnosis not present

## 2013-08-13 DIAGNOSIS — E119 Type 2 diabetes mellitus without complications: Secondary | ICD-10-CM | POA: Diagnosis not present

## 2013-08-13 DIAGNOSIS — M129 Arthropathy, unspecified: Secondary | ICD-10-CM | POA: Diagnosis not present

## 2013-08-13 DIAGNOSIS — W19XXXA Unspecified fall, initial encounter: Secondary | ICD-10-CM

## 2013-08-13 DIAGNOSIS — Y9210 Unspecified residential institution as the place of occurrence of the external cause: Secondary | ICD-10-CM | POA: Insufficient documentation

## 2013-08-13 DIAGNOSIS — G309 Alzheimer's disease, unspecified: Secondary | ICD-10-CM | POA: Insufficient documentation

## 2013-08-13 DIAGNOSIS — S0031XA Abrasion of nose, initial encounter: Secondary | ICD-10-CM

## 2013-08-13 DIAGNOSIS — S0083XA Contusion of other part of head, initial encounter: Secondary | ICD-10-CM

## 2013-08-13 DIAGNOSIS — S0990XA Unspecified injury of head, initial encounter: Secondary | ICD-10-CM | POA: Diagnosis not present

## 2013-08-13 DIAGNOSIS — Z8719 Personal history of other diseases of the digestive system: Secondary | ICD-10-CM | POA: Diagnosis not present

## 2013-08-13 MED ORDER — TETANUS-DIPHTH-ACELL PERTUSSIS 5-2.5-18.5 LF-MCG/0.5 IM SUSP
0.5000 mL | Freq: Once | INTRAMUSCULAR | Status: AC
  Administered 2013-08-13: 0.5 mL via INTRAMUSCULAR
  Filled 2013-08-13: qty 0.5

## 2013-08-13 NOTE — ED Notes (Signed)
Patient transported to CT 

## 2013-08-13 NOTE — ED Notes (Signed)
Pt moved to [address] for better visibility due to fall risk.  Gave pt some water and sat her up 30 degrees.  Attempted to reach her son Kristin Grant for her without success.  Will continue to monitor

## 2013-08-13 NOTE — ED Notes (Signed)
Patient transported to X-ray 

## 2013-08-13 NOTE — ED Provider Notes (Signed)
CSN: [id number]     Arrival date & time 08/13/13  0706 History  This chart was scribed for Joya Gaskins, MD by Bennett Scrape, ED Scribe. This patient was seen in room APA07/APA07 and the patient's care was started at 7:26 AM.   Chief Complaint  Patient presents with  . Fall   Level 5 Caveat-Dementia  The history is provided by the patient. No language interpreter was used.    HPI Comments: Kristin Grant is a 72 y.o. female brought in by ambulance from [facility] on a LSB and in a c-collar, who presents to the Emergency Department complaining of an unwitnessed fall this morning. Pt states that she fell in her hallway but is unsure what caused her fall. She is unsure if she hit her head but denies LOC. She denies having any new pain.    Past Medical History  Diagnosis Date  . Diabetes mellitus   . Hypertension   . Reflux   . Coronary artery disease   . Alzheimer disease   . Arthritis   . Stroke   . Difficulty walking    History reviewed. No pertinent past surgical history. Family History  Problem Relation Age of Onset  . Hypertension Mother   . Heart disease Mother   . Hypertension Father   . Heart disease Father   . Hypertension Sister   . Heart disease Sister   . Cancer Sister     BREAST   . Hypertension Brother   . Heart disease Brother    History  Substance Use Topics  . Smoking status: Never Smoker   . Smokeless tobacco: Not on file  . Alcohol Use: No   No OB history provided.  Review of Systems  Unable to perform ROS: Dementia    Allergies  Review of patient's allergies indicates no known allergies.  Home Medications   Current Outpatient Rx  Name  Route  Sig  Dispense  Refill  . aspirin 81 MG tablet   Oral   Take 81 mg by mouth daily.         . ASPIRIN LOW DOSE 81 MG EC tablet      TAKE 1 TABLET BY MOUTH ONCE DAILY.   30 tablet   11   . bismuth subsalicylate (PEPTO BISMOL) 262 MG/15ML suspension   Oral   Take 15 mLs by mouth every 6  (six) hours as needed. For upset stomach         . Blood Glucose Monitoring Suppl (BLOOD GLUCOSE MONITOR SYSTEM) W/DEVICE KIT   Does not apply   1 Units by Does not apply route daily.   1 each   0     DX: 250.00   . clopidogrel (PLAVIX) 75 MG tablet      TAKE 1 TABLET BY MOUTH ONCE DAILY.   30 tablet   5   . glimepiride (AMARYL) 4 MG tablet      TAKE 1 TABLET BY MOUTH ONCE DAILY BEFORE BREAKFAST.   30 tablet   3   . JANUMET 50-1000 MG per tablet      TAKE 1 TABLET BY MOUTH ONCE DAILY WITH SUPPER.   30 tablet   3   . KLOR-CON M20 20 MEQ tablet      take 1 tablet by mouth once daily   90 tablet   1   . levothyroxine (SYNTHROID, LEVOTHROID) 50 MCG tablet      TAKE 1 TABLET BY MOUTH ONCE DAILY.   30 tablet  3   . lisinopril (PRINIVIL,ZESTRIL) 40 MG tablet      TAKE 1 TABLET BY MOUTH ONCE DAILY.   30 tablet   3   . Multiple Vitamin (DAILY VITE) TABS      TAKE 1 TABLET BY MOUTH ONCE DAILY.   30 tablet   3   . NEXIUM 40 MG capsule      TAKE 1 CAPSULE BY MOUTH ONCE DAILY.   30 capsule   3   . propranolol (INDERAL) 40 MG tablet      TAKE 1 TABLET BY MOUTH TWICE DAILY.   60 tablet   3   . Q-PAP 500 MG tablet      TAKE 1 TABLET BY MOUTH EVERY 6 HOURS AS NEEDED FOR PAIN OR FEVER.   30 tablet   1    Triage Vitals: BP 147/87  Pulse 93  Temp(Src) 98 F (36.7 C) (Oral)  Resp 18  Ht 5\' 3"  (1.6 m)  Wt 120 lb (54.432 kg)  BMI 21.26 kg/m2  SpO2 100%  Physical Exam  Nursing note and vitals reviewed.  CONSTITUTIONAL: Well developed/well nourished HEAD: Normocephalic, abrasion to nose, hematoma to forehead, no septal hematoma, no trismus  EYES: EOMI/PERRL ENMT: Mucous membranes moist SPINE:lumbar tenderness, no cervical or thoracic tenderness, Patient maintained in spinal precautions/logroll utilized No bruising/crepitance/stepoffs noted to spine CV: S1/S2 noted, no murmurs/rubs/gallops noted LUNGS: Lungs are clear to auscultation bilaterally, no  apparent distress ABDOMEN: soft, nontender, no rebound or guarding NEURO: Pt is awake/alert, moves all extremitiesx4, pleasantly demented EXTREMITIES: pulses normal, full ROM, All other extremities/joints palpated/ranged and nontender No pelvic tenderness, full ROM of both hips SKIN: warm, color normal PSYCH: no abnormalities of mood noted  ED Course  Procedures (including critical care time)  DIAGNOSTIC STUDIES: Oxygen Saturation is 100% on room air, normal by my interpretation.    COORDINATION OF CARE: 7:29 AM-Pt removed from LSB. Will uphold c-collar precautions. Pt is requesting that we call Clide Cliff, her son.   9:13 AM CT imaging/lumbar films negative Ct cspine ordered as pt with h/o dementia and unable to fully clear cspine Also CT head ordered due to evidence of head trauma and is on plavix and poor historian Pt at baseline, suspect mechanical fall Pt ambulated and no distress noted   Labs Review Labs Reviewed - No data to display Imaging Review No results found.  EKG Interpretation   None       MDM  No diagnosis found. Nursing notes including past medical history and social history reviewed and considered in documentation xrays reviewed and considered   I personally performed the services described in this documentation, which was scribed in my presence. The recorded information has been reviewed and is accurate.     Joya Gaskins, MD 08/13/13 [phone]

## 2013-08-13 NOTE — ED Notes (Signed)
Pt comes from [facility] after an unwitnessed fall this morning. Pt is not sure what caused her to fall. Pt unsure if she hit her head. Pt reports back pain but states "I always have back pain". LSB and c-collar applied by EMS.

## 2013-08-16 ENCOUNTER — Ambulatory Visit (INDEPENDENT_AMBULATORY_CARE_PROVIDER_SITE_OTHER): Payer: Medicare Other | Admitting: Family Medicine

## 2013-08-16 ENCOUNTER — Encounter: Payer: Self-pay | Admitting: Family Medicine

## 2013-08-16 VITALS — BP 110/68 | HR 88 | Temp 98.4°F | Resp 18 | Ht 62.0 in | Wt 123.5 lb

## 2013-08-16 DIAGNOSIS — F0390 Unspecified dementia without behavioral disturbance: Secondary | ICD-10-CM

## 2013-08-16 DIAGNOSIS — L8990 Pressure ulcer of unspecified site, unspecified stage: Secondary | ICD-10-CM | POA: Diagnosis not present

## 2013-08-16 DIAGNOSIS — Z23 Encounter for immunization: Secondary | ICD-10-CM

## 2013-08-16 DIAGNOSIS — E875 Hyperkalemia: Secondary | ICD-10-CM | POA: Diagnosis not present

## 2013-08-16 DIAGNOSIS — L8991 Pressure ulcer of unspecified site, stage 1: Secondary | ICD-10-CM

## 2013-08-16 DIAGNOSIS — W19XXXD Unspecified fall, subsequent encounter: Secondary | ICD-10-CM

## 2013-08-16 DIAGNOSIS — W19XXXA Unspecified fall, initial encounter: Secondary | ICD-10-CM

## 2013-08-16 DIAGNOSIS — M549 Dorsalgia, unspecified: Secondary | ICD-10-CM

## 2013-08-16 LAB — BASIC METABOLIC PANEL
BUN: 10 mg/dL (ref 6–23)
CO2: 30 mEq/L (ref 19–32)
Calcium: 8.9 mg/dL (ref 8.4–10.5)
Chloride: 102 mEq/L (ref 96–112)
Glucose, Bld: 166 mg/dL — ABNORMAL HIGH (ref 70–99)
Potassium: 4.5 mEq/L (ref 3.5–5.3)
Sodium: 139 mEq/L (ref 135–145)

## 2013-08-16 NOTE — Patient Instructions (Signed)
We will call with lab results Prevnar 13 given Continue tylenol as neede for pain F/U 4 months-

## 2013-08-17 DIAGNOSIS — W19XXXA Unspecified fall, initial encounter: Secondary | ICD-10-CM | POA: Insufficient documentation

## 2013-08-17 DIAGNOSIS — M549 Dorsalgia, unspecified: Secondary | ICD-10-CM | POA: Insufficient documentation

## 2013-08-17 DIAGNOSIS — E875 Hyperkalemia: Secondary | ICD-10-CM | POA: Insufficient documentation

## 2013-08-17 NOTE — Assessment & Plan Note (Signed)
Normal examination today. This is likely musculoskeletal. She likely has some degeneration in her lumbar spine but she's not complaining today. I'll have been using Tylenol as needed. If her back pain worsens then we will get imaging

## 2013-08-17 NOTE — Assessment & Plan Note (Signed)
This is much improved. Continue wound precautions

## 2013-08-17 NOTE — Assessment & Plan Note (Addendum)
Followup in December 19 assisted-living facility. She's advised to use her walker at all times. Fall precautions have been set up for the assisted-living facility,   Bruising will resolve over next few weeks, okay to continue plavix, no acute bleed

## 2013-08-17 NOTE — Assessment & Plan Note (Signed)
She has been taking the potassium even I discontinued this a few weeks ago. I will check her potassium level today

## 2013-08-17 NOTE — Progress Notes (Signed)
   Subjective:    Patient ID: Kristin Grant, female    DOB: 1940-11-26, 72 y.o.   MRN: [id number]  HPI Patient here for interim visit. She was in the ER on December 19 after she fell at [facility]. She's been facility for almost 2 weeks. She was coming out of the bathroom when she tripped on her walker and fell to her face. She is on Plavix therefore CT of head was done which did not reveal any intracranial bleed. She did sustain hematoma to the 4 head as well as bruising and ecchymosis around the left eye and the bridge of her nose. She denies any pain today. The staff states that she had been complaining of a catch in her lower back but this was prior to the fall. They have been giving her Tylenol as needed which helps the pain.  Diabetes mellitus her blood sugars have ranged 80-196 fasting. She's not had any hypoglycemia  The lesion on her finger on her right hand fell off and was then shaved down by dermatology per report.   Review of Systems  GEN- denies fatigue, fever, weight loss,weakness, recent illness HEENT- denies eye drainage, change in vision, nasal discharge, CVS- denies chest pain, palpitations RESP- denies SOB, cough, wheeze ABD- denies N/V, change in stools, abd pain GU- denies dysuria, hematuria, dribbling, incontinence MSK- denies joint pain, muscle aches, injury Neuro- denies headache, dizziness, syncope, seizure activity      Objective:   Physical Exam GEN- NAD, alert and oriented x3, obese,  HEENT- PERRL EOMI, non icteric, MMM, poor dentition, oropharynx clear, TM Clear bilat no effusion CVS- RRR, no murmur RESP-CTAB EXT- no Edema Pulses- Radial, DP- 2+ MSK- Spine NT, neg SLR, fair ROM Back and HIPS Skin- ecchymosis around left eye, abrasion bridge of nose  Minimal erythema of sacrum no ulceration seen Neuro- CNII-XII in tact, no focal deficits         Assessment & Plan:

## 2013-08-17 NOTE — Assessment & Plan Note (Signed)
I believe she is better off in the assisted-living facility where she can have her medications and meals given to her on a regular basis.

## 2013-08-25 ENCOUNTER — Other Ambulatory Visit: Payer: Self-pay | Admitting: Family Medicine

## 2013-09-13 ENCOUNTER — Other Ambulatory Visit: Payer: Self-pay | Admitting: Family Medicine

## 2013-10-04 ENCOUNTER — Other Ambulatory Visit: Payer: Self-pay | Admitting: Family Medicine

## 2013-10-11 ENCOUNTER — Encounter (HOSPITAL_COMMUNITY): Payer: Self-pay | Admitting: Emergency Medicine

## 2013-10-11 ENCOUNTER — Emergency Department (HOSPITAL_COMMUNITY)
Admission: EM | Admit: 2013-10-11 | Discharge: 2013-10-11 | Disposition: A | Discharge status: Home / Self Care | Payer: Medicare Other | Attending: Emergency Medicine | Admitting: Emergency Medicine

## 2013-10-11 DIAGNOSIS — Z791 Long term (current) use of non-steroidal anti-inflammatories (NSAID): Secondary | ICD-10-CM | POA: Diagnosis not present

## 2013-10-11 DIAGNOSIS — E119 Type 2 diabetes mellitus without complications: Secondary | ICD-10-CM | POA: Diagnosis not present

## 2013-10-11 DIAGNOSIS — M545 Low back pain, unspecified: Secondary | ICD-10-CM | POA: Insufficient documentation

## 2013-10-11 DIAGNOSIS — F0280 Dementia in other diseases classified elsewhere without behavioral disturbance: Secondary | ICD-10-CM | POA: Insufficient documentation

## 2013-10-11 DIAGNOSIS — Z7902 Long term (current) use of antithrombotics/antiplatelets: Secondary | ICD-10-CM | POA: Insufficient documentation

## 2013-10-11 DIAGNOSIS — Z7982 Long term (current) use of aspirin: Secondary | ICD-10-CM | POA: Diagnosis not present

## 2013-10-11 DIAGNOSIS — Z8673 Personal history of transient ischemic attack (TIA), and cerebral infarction without residual deficits: Secondary | ICD-10-CM | POA: Diagnosis not present

## 2013-10-11 DIAGNOSIS — I2510 Atherosclerotic heart disease of native coronary artery without angina pectoris: Secondary | ICD-10-CM | POA: Insufficient documentation

## 2013-10-11 DIAGNOSIS — G309 Alzheimer's disease, unspecified: Secondary | ICD-10-CM | POA: Insufficient documentation

## 2013-10-11 DIAGNOSIS — Z8719 Personal history of other diseases of the digestive system: Secondary | ICD-10-CM | POA: Diagnosis not present

## 2013-10-11 DIAGNOSIS — I10 Essential (primary) hypertension: Secondary | ICD-10-CM | POA: Diagnosis not present

## 2013-10-11 DIAGNOSIS — M129 Arthropathy, unspecified: Secondary | ICD-10-CM | POA: Insufficient documentation

## 2013-10-11 DIAGNOSIS — Z79899 Other long term (current) drug therapy: Secondary | ICD-10-CM | POA: Insufficient documentation

## 2013-10-11 DIAGNOSIS — I6789 Other cerebrovascular disease: Secondary | ICD-10-CM | POA: Diagnosis not present

## 2013-10-11 DIAGNOSIS — N390 Urinary tract infection, site not specified: Secondary | ICD-10-CM | POA: Diagnosis not present

## 2013-10-11 DIAGNOSIS — F4489 Other dissociative and conversion disorders: Secondary | ICD-10-CM | POA: Diagnosis not present

## 2013-10-11 DIAGNOSIS — F0390 Unspecified dementia without behavioral disturbance: Secondary | ICD-10-CM | POA: Diagnosis not present

## 2013-10-11 DIAGNOSIS — M549 Dorsalgia, unspecified: Secondary | ICD-10-CM

## 2013-10-11 LAB — URINALYSIS, ROUTINE W REFLEX MICROSCOPIC
Bilirubin Urine: NEGATIVE
Glucose, UA: NEGATIVE mg/dL
Hgb urine dipstick: NEGATIVE
Ketones, ur: NEGATIVE mg/dL
Nitrite: NEGATIVE
Protein, ur: NEGATIVE mg/dL
Specific Gravity, Urine: 1.025 (ref 1.005–1.030)
UROBILINOGEN UA: 0.2 mg/dL (ref 0.0–1.0)
pH: 5.5 (ref 5.0–8.0)

## 2013-10-11 LAB — URINE MICROSCOPIC-ADD ON

## 2013-10-11 MED ORDER — CEPHALEXIN 500 MG PO CAPS: 500.0000 mg | ORAL_CAPSULE | Freq: Four times a day (QID) | ORAL | Status: DC

## 2013-10-11 MED ORDER — NAPROXEN 500 MG PO TABS: 500.0000 mg | ORAL_TABLET | Freq: Two times a day (BID) | ORAL | Status: DC

## 2013-10-11 MED ORDER — TRAMADOL HCL 50 MG PO TABS: 50.0000 mg | ORAL_TABLET | Freq: Four times a day (QID) | ORAL | Status: DC | PRN

## 2013-10-11 MED ORDER — CEPHALEXIN 500 MG PO CAPS
500.0000 mg | ORAL_CAPSULE | Freq: Once | ORAL | Status: AC
  Administered 2013-10-11: 500 mg via ORAL
  Filled 2013-10-11: qty 1

## 2013-10-11 MED ORDER — TRAMADOL HCL 50 MG PO TABS
50.0000 mg | ORAL_TABLET | Freq: Once | ORAL | Status: AC
  Administered 2013-10-11: 50 mg via ORAL
  Filled 2013-10-11: qty 1

## 2013-10-11 NOTE — Discharge Instructions (Signed)
Your testing shows that you have a urinary infection, Keflex for 7 days  Back Pain:   Your back pain should be treated with medicines such as ibuprofen or aleve and this back pain should get better over the next 2 weeks.  However if you develop severe or worsening pain, low back pain with fever, numbness, weakness or inability to walk or urinate, you should return to the ER immediately.  Please follow up with your doctor this week for a recheck if still having symptoms. Low back pain is discomfort in the lower back that may be due to injuries to muscles and ligaments around the spine.  Occasionally, it may be caused by a a problem to a part of the spine called a disc.  The pain may last several days or a week;  However, most patients get completely well in 4 weeks.  Self - care:  The application of heat can help soothe the pain.  Maintaining your daily activities, including walking, is encourged, as it will help you get better faster than just staying in bed.  Medications are also useful to help with pain control.  A commonly prescribed medications includes acetaminophen.  This medication is generally safe, though you should not take more than 8 of the extra strength (500mg ) pills a day.  Non steroidal anti inflammatory medications including Ibuprofen and naproxen;  These medications help both pain and swelling and are very useful in treating back pain.  They should be taken with food, as they can cause stomach upset, and more seriously, stomach bleeding.    Muscle relaxants:  These medications can help with muscle tightness that is a cause of lower back pain.  Most of these medications can cause drowsiness, and it is not safe to drive or use dangerous machinery while taking them.  You will need to follow up with  Your primary healthcare provider in 1-2 weeks for reassessment.  Be aware that if you develop new symptoms, such as a fever, leg weakness, difficulty with or loss of control of your urine  or bowels, abdominal pain, or more severe pain, you will need to seek medical attention and  / or return to the Emergency department.  If you do not have a doctor see the list below.

## 2013-10-11 NOTE — ED Provider Notes (Signed)
CSN: [id number]     Arrival date & time 10/11/13  0600 History   First MD Initiated Contact with Patient 10/11/13 [phone]     Chief Complaint  Patient presents with  . Back Pain     (Consider location/radiation/quality/duration/timing/severity/associated sxs/prior Treatment) HPI Comments: The patient is a 73 year old female with dementia who presents with a complaint of lower back pain. She states this has been going on for quite some time, possibly longer than one month, seems to be worse at night when she gets stiff laying in bed. She denies any fevers chills nausea vomiting or difficulty with urination and denies any diarrhea swelling or rashes. She has no numbness or weakness in her legs and has been able to continue to ambulate at night at her nursing facility. The back pain is located in the lower back, right and left lower back. Denies radiation into the leg  Patient is a 73 y.o. female presenting with back pain. The history is provided by the patient, the EMS personnel and the nursing home.  Back Pain Associated symptoms: no fever, no numbness and no weakness     Past Medical History  Diagnosis Date  . Diabetes mellitus   . Hypertension   . Reflux   . Coronary artery disease   . Alzheimer disease   . Arthritis   . Stroke   . Difficulty walking    History reviewed. No pertinent past surgical history. Family History  Problem Relation Age of Onset  . Hypertension Mother   . Heart disease Mother   . Hypertension Father   . Heart disease Father   . Hypertension Sister   . Heart disease Sister   . Cancer Sister     BREAST   . Hypertension Brother   . Heart disease Brother    History  Substance Use Topics  . Smoking status: Never Smoker   . Smokeless tobacco: Not on file  . Alcohol Use: No   OB History   Grav Para Term Preterm Abortions TAB SAB Ect Mult Living                 Review of Systems  Constitutional: Negative for fever and chills.  Cardiovascular:  Negative for leg swelling.  Gastrointestinal: Negative for nausea and vomiting.       No incontinence of bowel  Genitourinary: Negative for difficulty urinating.       No incontinence or retention  Musculoskeletal: Positive for back pain. Negative for neck pain.  Skin: Negative for rash.  Neurological: Negative for weakness and numbness.      Allergies  Review of patient's allergies indicates no known allergies.  Home Medications   Current Outpatient Rx  Name  Route  Sig  Dispense  Refill  . aspirin 81 MG tablet   Oral   Take 81 mg by mouth daily.         Marland Kitchen bismuth subsalicylate (PEPTO BISMOL) 262 MG/15ML suspension   Oral   Take 15 mLs by mouth every 6 (six) hours as needed. For upset stomach         . Blood Glucose Monitoring Suppl (BLOOD GLUCOSE MONITOR SYSTEM) W/DEVICE KIT   Does not apply   1 Units by Does not apply route daily.   1 each   0     DX: 250.00   . cephALEXin (KEFLEX) 500 MG capsule   Oral   Take 1 capsule (500 mg total) by mouth 4 (four) times daily.   28 capsule  0   . clopidogrel (PLAVIX) 75 MG tablet      TAKE 1 TABLET BY MOUTH ONCE DAILY.   30 tablet   5   . glimepiride (AMARYL) 4 MG tablet      TAKE 1 TABLET BY MOUTH ONCE DAILY BEFORE BREAKFAST.   30 tablet   3   . JANUMET 50-1000 MG per tablet      TAKE 1 TABLET BY MOUTH ONCE DAILY WITH SUPPER.   30 tablet   3   . KLOR-CON M20 20 MEQ tablet      take 1 tablet by mouth once daily   90 tablet   1   . levothyroxine (SYNTHROID, LEVOTHROID) 50 MCG tablet      TAKE 1 TABLET BY MOUTH ONCE DAILY.   30 tablet   3   . lisinopril (PRINIVIL,ZESTRIL) 40 MG tablet      TAKE 1 TABLET BY MOUTH ONCE DAILY.   30 tablet   3   . Multiple Vitamin (DAILY VITE) TABS      TAKE 1 TABLET BY MOUTH ONCE DAILY.   30 tablet   3   . naproxen (NAPROSYN) 500 MG tablet   Oral   Take 1 tablet (500 mg total) by mouth 2 (two) times daily with a meal.   30 tablet   0   . NEXIUM 40 MG  capsule      TAKE 1 CAPSULE BY MOUTH ONCE DAILY.   30 capsule   3   . propranolol (INDERAL) 40 MG tablet      TAKE 1 TABLET BY MOUTH TWICE DAILY.   60 tablet   3   . Q-PAP 500 MG tablet      TAKE 1 TABLET BY MOUTH EVERY 6 HOURS AS NEEDED FOR PAIN OR FEVER.   30 tablet   0   . traMADol (ULTRAM) 50 MG tablet   Oral   Take 1 tablet (50 mg total) by mouth every 6 (six) hours as needed.   15 tablet   0    BP 138/67  Pulse 102  Temp(Src) 98.1 F (36.7 C) (Oral)  Resp 20  Wt 122 lb (55.339 kg)  SpO2 98% Physical Exam  Nursing note and vitals reviewed. Constitutional: She appears well-developed and well-nourished. No distress.  HENT:  Head: Normocephalic and atraumatic.  Eyes: Conjunctivae and EOM are normal. Pupils are equal, round, and reactive to light. Right eye exhibits no discharge. Left eye exhibits no discharge. No scleral icterus.  Neck: Normal range of motion. Neck supple. No JVD present. No thyromegaly present.  Cardiovascular: Normal rate, regular rhythm, normal heart sounds and intact distal pulses.  Exam reveals no gallop and no friction rub.   No murmur heard. Pulmonary/Chest: Effort normal and breath sounds normal. No respiratory distress. She has no wheezes. She has no rales.  Abdominal: Soft. Bowel sounds are normal. She exhibits no distension and no mass. There is no tenderness.  Musculoskeletal: Normal range of motion. She exhibits tenderness ( Tender to palpation over the bilateral lower back, no spinal tenderness). She exhibits no edema.  Lymphadenopathy:    She has no cervical adenopathy.  Neurological: She is alert. Coordination normal.  Normal strength sensation and reflexes of the bilateral lower extremity is  Skin: Skin is warm and dry. No rash noted. No erythema.  Psychiatric: She has a normal mood and affect. Her behavior is normal.    ED Course  Procedures (including critical care time) Labs Review Labs Reviewed  URINALYSIS, ROUTINE W  REFLEX MICROSCOPIC - Abnormal; Notable for the following:    Leukocytes, UA TRACE (*)    All other components within normal limits  URINE MICROSCOPIC-ADD ON - Abnormal; Notable for the following:    Bacteria, UA MANY (*)    All other components within normal limits  URINE CULTURE   Imaging Review No results found.   MDM   Final diagnoses:  Back pain  UTI (lower urinary tract infection)    The patient has normal neurologic exam, reproducible bilateral lower back pain and with her history of pain that seems to be at night when she lays down this seems to be more musculoskeletal. She has no urinary symptoms, no neurologic symptoms, has maintained her ability to ambulate without difficulty. Will obtain a urine sample, unlikely to be a pathologic source of back pain, can followup for further imaging as needed if symptoms persist  UA confirms UTI  Pain meds given,   Johnna Acosta, MD 10/11/13 [phone]

## 2013-10-11 NOTE — ED Notes (Addendum)
Pt to department via EMS from [facility].  Reporting lower back pain for about 1 week.  Per EMS, VS stable, blood sugar 122.  Pt reports pain is worse at night when she lays down.

## 2013-10-11 NOTE — ED Notes (Signed)
Resting with eyes closed .  No distress

## 2013-10-11 NOTE — ED Notes (Signed)
Patient left department at this time with [facility] staff. No distress upon departure.

## 2013-10-11 NOTE — ED Notes (Signed)
[facility] called. Synetta FailAnita, LPN given report on patient. Discharge instructions reviewed. Synetta Failnita to sent transportation for patient.

## 2013-10-13 DIAGNOSIS — B351 Tinea unguium: Secondary | ICD-10-CM | POA: Diagnosis not present

## 2013-10-13 DIAGNOSIS — M79609 Pain in unspecified limb: Secondary | ICD-10-CM | POA: Diagnosis not present

## 2013-10-13 LAB — URINE CULTURE: Colony Count: >=100000

## 2013-11-09 ENCOUNTER — Telehealth: Payer: Self-pay | Admitting: *Deleted

## 2013-11-09 NOTE — Telephone Encounter (Signed)
Received fax requesting order for incontinence supplies.   Order faxed to facility.

## 2013-11-16 ENCOUNTER — Other Ambulatory Visit: Payer: Self-pay | Admitting: *Deleted

## 2013-11-16 MED ORDER — TRAMADOL HCL 50 MG PO TABS: 50.0000 mg | ORAL_TABLET | Freq: Four times a day (QID) | ORAL | Status: DC | PRN

## 2013-11-16 NOTE — Telephone Encounter (Signed)
Per MD order, medication faxed to facility.

## 2013-12-01 DIAGNOSIS — H524 Presbyopia: Secondary | ICD-10-CM | POA: Diagnosis not present

## 2013-12-01 DIAGNOSIS — E119 Type 2 diabetes mellitus without complications: Secondary | ICD-10-CM | POA: Diagnosis not present

## 2013-12-01 DIAGNOSIS — H25019 Cortical age-related cataract, unspecified eye: Secondary | ICD-10-CM | POA: Diagnosis not present

## 2013-12-01 DIAGNOSIS — H40009 Preglaucoma, unspecified, unspecified eye: Secondary | ICD-10-CM | POA: Diagnosis not present

## 2013-12-15 ENCOUNTER — Encounter: Payer: Self-pay | Admitting: Family Medicine

## 2013-12-15 ENCOUNTER — Ambulatory Visit (INDEPENDENT_AMBULATORY_CARE_PROVIDER_SITE_OTHER): Payer: Medicare Other | Admitting: Family Medicine

## 2013-12-15 VITALS — BP 124/72 | HR 72 | Temp 98.4°F | Resp 14 | Ht 62.0 in | Wt 118.0 lb

## 2013-12-15 DIAGNOSIS — E119 Type 2 diabetes mellitus without complications: Secondary | ICD-10-CM | POA: Diagnosis not present

## 2013-12-15 DIAGNOSIS — F0390 Unspecified dementia without behavioral disturbance: Secondary | ICD-10-CM

## 2013-12-15 DIAGNOSIS — L8990 Pressure ulcer of unspecified site, unspecified stage: Secondary | ICD-10-CM

## 2013-12-15 DIAGNOSIS — I10 Essential (primary) hypertension: Secondary | ICD-10-CM

## 2013-12-15 DIAGNOSIS — E46 Unspecified protein-calorie malnutrition: Secondary | ICD-10-CM | POA: Insufficient documentation

## 2013-12-15 DIAGNOSIS — L8991 Pressure ulcer of unspecified site, stage 1: Secondary | ICD-10-CM

## 2013-12-15 DIAGNOSIS — R634 Abnormal weight loss: Secondary | ICD-10-CM

## 2013-12-15 LAB — CBC WITH DIFFERENTIAL/PLATELET
BASOS PCT: 0 % (ref 0–1)
Basophils Absolute: 0 10*3/uL (ref 0.0–0.1)
EOS ABS: 0.2 10*3/uL (ref 0.0–0.7)
EOS PCT: 3 % (ref 0–5)
HCT: 40 % (ref 36.0–46.0)
Hemoglobin: 13.2 g/dL (ref 12.0–15.0)
LYMPHS ABS: 1.7 10*3/uL (ref 0.7–4.0)
Lymphocytes Relative: 21 % (ref 12–46)
MCH: 26.9 pg (ref 26.0–34.0)
MCHC: 33 g/dL (ref 30.0–36.0)
MCV: 81.6 fL (ref 78.0–100.0)
Monocytes Absolute: 0.7 10*3/uL (ref 0.1–1.0)
Monocytes Relative: 8 % (ref 3–12)
NEUTROS PCT: 68 % (ref 43–77)
Neutro Abs: 5.6 10*3/uL (ref 1.7–7.7)
PLATELETS: 210 10*3/uL (ref 150–400)
RBC: 4.9 MIL/uL (ref 3.87–5.11)
RDW: 14.8 % (ref 11.5–15.5)
WBC: 8.2 10*3/uL (ref 4.0–10.5)

## 2013-12-15 LAB — COMPREHENSIVE METABOLIC PANEL
ALK PHOS: 63 U/L (ref 39–117)
ALT: 9 U/L (ref 0–35)
AST: 11 U/L (ref 0–37)
Albumin: 3.9 g/dL (ref 3.5–5.2)
BILIRUBIN TOTAL: 0.4 mg/dL (ref 0.2–1.2)
BUN: 11 mg/dL (ref 6–23)
CO2: 29 meq/L (ref 19–32)
CREATININE: 0.56 mg/dL (ref 0.50–1.10)
Calcium: 9.3 mg/dL (ref 8.4–10.5)
Chloride: 101 mEq/L (ref 96–112)
Glucose, Bld: 91 mg/dL (ref 70–99)
Potassium: 4.5 mEq/L (ref 3.5–5.3)
Sodium: 141 mEq/L (ref 135–145)
Total Protein: 6 g/dL (ref 6.0–8.3)

## 2013-12-15 LAB — HEMOGLOBIN A1C
Hgb A1c MFr Bld: 5.8 % — ABNORMAL HIGH (ref <5.7)
Mean Plasma Glucose: 120 mg/dL — ABNORMAL HIGH (ref <117)

## 2013-12-15 NOTE — Assessment & Plan Note (Signed)
Blood pressure looks good.  

## 2013-12-15 NOTE — Assessment & Plan Note (Signed)
A1c. I will DC the him a rolling she's having too many hypoglycemic episodes her A1c goal be around 8%

## 2013-12-15 NOTE — Assessment & Plan Note (Signed)
Weight loss noted. I'll have him give her [employer] Instant Breakfast

## 2013-12-15 NOTE — Assessment & Plan Note (Signed)
Stage I ulcer unchanged unfortunately she states a lot of toilet paper in paper towels into her depends which is also rubbing on the skin. The staff will followup with this at the facility

## 2013-12-15 NOTE — Progress Notes (Signed)
Patient ID: Kristin Grant, female   DOB: [birth year], 73 y.o.   MRN: [id number]   Subjective:    Patient ID: Kristin DineElsene Nancarrow, female    DOB: [birth year], 73 y.o.   MRN: [id number]  Patient presents for 4 month F/U  patient here with an aide from the facility. She's here follow chronic medical problems. He has no specific concerns noted. Her blood sugars show hypoglycemia sugars have ranged from 50-113 fasting she is on change in [place] her last A1c was 7.3% back in August of 2014. She's not had any recent falls. She is eating fairly well however her weight has dropped 4 pounds since February 2015. Medications reviewed Immunizations up-to-date     Review Of Systems: Unable to obtain complete ROS  GEN- denies fatigue, fever, weight loss,weakness, recent illness HEENT- denies eye drainage, change in vision, nasal discharge, CVS- denies chest pain, palpitations RESP- denies SOB, cough, wheeze ABD- denies N/V, change in stools, abd pain Neuro- denies headache, dizziness, syncope, seizure activity       Objective:    BP 124/72  Pulse 72  Temp(Src) 98.4 F (36.9 C) (Oral)  Resp 14  Ht 5\' 2"  (1.575 m)  Wt 118 lb (53.524 kg)  BMI 21.58 kg/m2 GEN- NAD, alert and oriented x2, obese,, weight loss noted  HEENT- PERRL EOMI, non icteric, MMM, poor dentition, oropharynx clear, TM Clear bilat no effusion CVS- RRR, no murmur RESP-CTAB ABD-NABS,soft,NT,ND EXT- no Edema Pulses- Radial, DP- 2+ Neuro- walks with walker Skin- stage I ulcer  Psych- pleasant, normal affect and mood        Assessment & Plan:      Problem List Items Addressed This Visit   Hypertension   Diabetes mellitus - Primary   Relevant Orders      CBC with Differential      Comprehensive metabolic panel      Hemoglobin A1c   Dementia      Note: This dictation was prepared with Dragon dictation along with smaller phrase technology. Any transcriptional errors that result from this process are unintentional.

## 2013-12-15 NOTE — Patient Instructions (Signed)
Stop the amaryl Add carnation shake once a day  We will call with labs  F/U 4 months

## 2013-12-15 NOTE — Assessment & Plan Note (Signed)
Unchanged she's been very pleasant no behavioral issues appear

## 2013-12-22 ENCOUNTER — Other Ambulatory Visit: Payer: Self-pay | Admitting: Family Medicine

## 2013-12-22 NOTE — Telephone Encounter (Signed)
Okay to refill, give 3 for SNF

## 2013-12-22 NOTE — Telephone Encounter (Signed)
Ok to refill??  Last office visit 12/15/2013.  Last refill 11/16/2013.

## 2013-12-22 NOTE — Telephone Encounter (Signed)
Medication called to pharmacy. 

## 2014-01-10 DIAGNOSIS — B351 Tinea unguium: Secondary | ICD-10-CM | POA: Diagnosis not present

## 2014-01-10 DIAGNOSIS — M79609 Pain in unspecified limb: Secondary | ICD-10-CM | POA: Diagnosis not present

## 2014-03-16 DIAGNOSIS — B351 Tinea unguium: Secondary | ICD-10-CM | POA: Diagnosis not present

## 2014-03-16 DIAGNOSIS — M79609 Pain in unspecified limb: Secondary | ICD-10-CM | POA: Diagnosis not present

## 2014-06-07 DIAGNOSIS — Z23 Encounter for immunization: Secondary | ICD-10-CM | POA: Diagnosis not present

## 2014-06-13 DIAGNOSIS — B351 Tinea unguium: Secondary | ICD-10-CM | POA: Diagnosis not present

## 2014-06-13 DIAGNOSIS — M79676 Pain in unspecified toe(s): Secondary | ICD-10-CM | POA: Diagnosis not present

## 2014-06-13 DIAGNOSIS — M79673 Pain in unspecified foot: Secondary | ICD-10-CM | POA: Diagnosis not present

## 2014-06-22 ENCOUNTER — Encounter: Payer: Self-pay | Admitting: Family Medicine

## 2014-06-22 ENCOUNTER — Ambulatory Visit (INDEPENDENT_AMBULATORY_CARE_PROVIDER_SITE_OTHER): Payer: Medicare Other | Admitting: Family Medicine

## 2014-06-22 VITALS — BP 128/70 | HR 72 | Temp 98.8°F | Resp 14 | Ht 61.0 in | Wt 126.0 lb

## 2014-06-22 DIAGNOSIS — I10 Essential (primary) hypertension: Secondary | ICD-10-CM | POA: Diagnosis not present

## 2014-06-22 DIAGNOSIS — E119 Type 2 diabetes mellitus without complications: Secondary | ICD-10-CM | POA: Insufficient documentation

## 2014-06-22 DIAGNOSIS — F0390 Unspecified dementia without behavioral disturbance: Secondary | ICD-10-CM | POA: Diagnosis not present

## 2014-06-22 DIAGNOSIS — E038 Other specified hypothyroidism: Secondary | ICD-10-CM | POA: Diagnosis not present

## 2014-06-22 LAB — TSH: TSH: 1.929 u[IU]/mL (ref 0.350–4.500)

## 2014-06-22 LAB — COMPREHENSIVE METABOLIC PANEL
ALT: 8 U/L (ref 0–35)
AST: 11 U/L (ref 0–37)
Albumin: 3.9 g/dL (ref 3.5–5.2)
Alkaline Phosphatase: 72 U/L (ref 39–117)
BUN: 13 mg/dL (ref 6–23)
CALCIUM: 9.3 mg/dL (ref 8.4–10.5)
CHLORIDE: 98 meq/L (ref 96–112)
CO2: 31 meq/L (ref 19–32)
Creat: 0.73 mg/dL (ref 0.50–1.10)
GLUCOSE: 226 mg/dL — AB (ref 70–99)
POTASSIUM: 4.5 meq/L (ref 3.5–5.3)
SODIUM: 138 meq/L (ref 135–145)
TOTAL PROTEIN: 6.1 g/dL (ref 6.0–8.3)
Total Bilirubin: 0.5 mg/dL (ref 0.2–1.2)

## 2014-06-22 LAB — HEMOGLOBIN A1C
HEMOGLOBIN A1C: 8 % — AB (ref <5.7)
Mean Plasma Glucose: 183 mg/dL — ABNORMAL HIGH (ref <117)

## 2014-06-22 LAB — LIPID PANEL
CHOL/HDL RATIO: 2.7 ratio
Cholesterol: 107 mg/dL (ref 0–200)
HDL: 40 mg/dL (ref >39)
LDL Cholesterol: 37 mg/dL (ref 0–99)
Triglycerides: 152 mg/dL — ABNORMAL HIGH (ref <150)
VLDL: 30 mg/dL (ref 0–40)

## 2014-06-22 LAB — T4, FREE: Free T4: 1.55 ng/dL (ref 0.80–1.80)

## 2014-06-22 MED ORDER — GUAIFENESIN-DM 100-10 MG/5ML PO SYRP: 5.0000 mL | ORAL_SOLUTION | ORAL | Status: DC | PRN

## 2014-06-22 NOTE — Assessment & Plan Note (Signed)
Blood pressure is well controlled and changed her medication 

## 2014-06-22 NOTE — Assessment & Plan Note (Signed)
Doing well no behavioral problems. No change in medication

## 2014-06-22 NOTE — Patient Instructions (Signed)
Continue current medications Cough medication  F/U 3 months

## 2014-06-22 NOTE — Assessment & Plan Note (Signed)
Check thyroid function tests 

## 2014-06-22 NOTE — Assessment & Plan Note (Addendum)
We'll check her A1c she is currently on Janumet goal is around 7% Percent with eye doctor exam. She started being seen by podiatry.  I do not see any evidence of any respiratory illness today however I did go ahead and put cough medicine when necessary on her medication sheet for the assisted living

## 2014-06-22 NOTE — Progress Notes (Signed)
Patient ID: Kristin Grant Service, female   DOB: 07-Jan-1941, 73 y.o.   MRN: [id number]   Subjective:    Patient ID: Kristin DineElsene Gebhart, female    DOB: 07-Jan-1941, 73 y.o.   MRN: [id number]  Patient presents for F/U  patient here to follow-up chronic medical problems. She is doing well in her assisted living facility. 2 history of diabetes mellitus due for fasting labs if she did have a small amount of breakfast this morning. They states that her blood sugars have been good day did not send her fasting readings today. There've been no recent medication changes. She has not had any recent falls. They do not have any concerns regarding her behavior with regards to her dementia. Flu shot has been given already She does note that she had some runny nose and a little cough that started yesterday.    Review Of Systems:  GEN- denies fatigue, fever, weight loss,weakness, recent illness HEENT- denies eye drainage, change in vision, +nasal discharge, CVS- denies chest pain, palpitations RESP- denies SOB,+ cough, wheeze ABD- denies N/V, change in stools, abd pain GU- denies dysuria, hematuria, dribbling, incontinence MSK- denies joint pain, muscle aches, injury Neuro- denies headache, dizziness, syncope, seizure activity       Objective:    BP 128/70  Pulse 72  Temp(Src) 98.8 F (37.1 C) (Oral)  Resp 14  Ht 5\' 1"  (1.549 m)  Wt 126 lb (57.153 kg)  BMI 23.82 kg/m2 GEN- NAD, alert and oriented x2, obese,, weight loss noted  HEENT- PERRL EOMI, non icteric, MMM, poor dentition, oropharynx clear, TM Clear bilat no effusion CVS- RRR, no murmur RESP-CTAB ABD-NABS,soft,NT,ND EXT- no Edema Pulses- Radial, DP- 2+ Neuro- walks with walker        Assessment & Plan:      Problem List Items Addressed This Visit   Hypothyroidism   Relevant Orders      TSH      T4, free   Hypertension   Relevant Orders      Lipid panel   Diabetes mellitus, type II - Primary   Relevant Orders      CBC with Differential       Comprehensive metabolic panel      Hemoglobin A1c      Microalbumin / creatinine urine ratio   Dementia      Note: This dictation was prepared with Dragon dictation along with smaller phrase technology. Any transcriptional errors that result from this process are unintentional.

## 2014-06-23 LAB — CBC WITH DIFFERENTIAL/PLATELET
Basophils Absolute: 0 10*3/uL (ref 0.0–0.1)
Basophils Relative: 0 % (ref 0–1)
Eosinophils Absolute: 0.3 10*3/uL (ref 0.0–0.7)
Eosinophils Relative: 3 % (ref 0–5)
HEMATOCRIT: 40.8 % (ref 36.0–46.0)
HEMOGLOBIN: 13.8 g/dL (ref 12.0–15.0)
LYMPHS ABS: 1.5 10*3/uL (ref 0.7–4.0)
Lymphocytes Relative: 18 % (ref 12–46)
MCH: 28.1 pg (ref 26.0–34.0)
MCHC: 33.8 g/dL (ref 30.0–36.0)
MCV: 83.1 fL (ref 78.0–100.0)
MONO ABS: 0.7 10*3/uL (ref 0.1–1.0)
MONOS PCT: 8 % (ref 3–12)
NEUTROS ABS: 6 10*3/uL (ref 1.7–7.7)
Neutrophils Relative %: 71 % (ref 43–77)
Platelets: 138 10*3/uL — ABNORMAL LOW (ref 150–400)
RBC: 4.91 MIL/uL (ref 3.87–5.11)
RDW: 14.1 % (ref 11.5–15.5)
WBC: 8.4 10*3/uL (ref 4.0–10.5)

## 2014-06-24 ENCOUNTER — Encounter: Payer: Self-pay | Admitting: Family Medicine

## 2014-06-24 ENCOUNTER — Telehealth: Payer: Self-pay | Admitting: Family Medicine

## 2014-06-24 NOTE — Telephone Encounter (Signed)
ALF called with patient lab results and order was faxed to them so they can change residents medications per provider

## 2014-06-24 NOTE — Telephone Encounter (Signed)
Message copied by Donne AnonPLUMMER, KIM M on Fri Jun 24, 2014  3:20 PM ------      Message from: Milinda AntisURHAM, KAWANTA F      Created: Thu Jun 23, 2014 10:14 PM       Send a note to pt ALF, her diabetes is worst, I want to add another 50mg  [place]uvia tablet to her current Janumet (50-1000mg ),  She will be on a total of januvia 100mg   And Metformin 1000mg  once a day ------

## 2014-06-29 DIAGNOSIS — H40019 Open angle with borderline findings, low risk, unspecified eye: Secondary | ICD-10-CM | POA: Diagnosis not present

## 2014-06-29 DIAGNOSIS — H53032 Strabismic amblyopia, left eye: Secondary | ICD-10-CM | POA: Diagnosis not present

## 2014-06-29 DIAGNOSIS — H5010 Unspecified exotropia: Secondary | ICD-10-CM | POA: Diagnosis not present

## 2014-07-08 ENCOUNTER — Other Ambulatory Visit: Payer: Self-pay | Admitting: Family Medicine

## 2014-07-08 NOTE — Telephone Encounter (Signed)
Medication refilled per protocol. 

## 2014-08-29 ENCOUNTER — Other Ambulatory Visit: Payer: Self-pay | Admitting: Family Medicine

## 2014-08-30 NOTE — Telephone Encounter (Signed)
Refill appropriate and filled per protocol. 

## 2014-09-05 ENCOUNTER — Telehealth: Payer: Self-pay | Admitting: Family Medicine

## 2014-09-05 NOTE — Telephone Encounter (Signed)
[phone]  [name] calling from [facility] calling to ask if we filled out the order for her nutrition values? Please call her back

## 2014-09-05 NOTE — Telephone Encounter (Signed)
Requested form to be re-sent.

## 2014-09-05 NOTE — Telephone Encounter (Signed)
MD please advise

## 2014-09-05 NOTE — Telephone Encounter (Signed)
They can resend form, i dont know what form is

## 2014-09-07 ENCOUNTER — Telehealth: Payer: Self-pay | Admitting: Family Medicine

## 2014-09-07 NOTE — Telephone Encounter (Signed)
[facility].   Reports that she sent over prescription medication list that requires MD signature.   Advised that Clinical research associatewriter will look for form and advise her of progress.

## 2014-09-07 NOTE — Telephone Encounter (Signed)
Forms noted in MD in box awaiting signature.

## 2014-09-07 NOTE — Telephone Encounter (Signed)
Kristin LeashDonna from [facility] calling to see if we received forms faxed over yesterday and what the progress is on this  [phone]

## 2014-09-27 DIAGNOSIS — M79674 Pain in right toe(s): Secondary | ICD-10-CM | POA: Diagnosis not present

## 2014-09-27 DIAGNOSIS — B351 Tinea unguium: Secondary | ICD-10-CM | POA: Diagnosis not present

## 2014-09-28 ENCOUNTER — Ambulatory Visit: Payer: Medicare Other | Admitting: Family Medicine

## 2014-09-29 ENCOUNTER — Telehealth: Payer: Self-pay | Admitting: *Deleted

## 2014-09-29 NOTE — Telephone Encounter (Signed)
Received request from pharmacy for PA on Janumet  PA submitted.   Dx: DM (E11.9)

## 2014-09-29 NOTE — Telephone Encounter (Signed)
Received PA determination.   PA approved through 08/26/2015.  Ref # [id number].

## 2014-09-29 NOTE — Telephone Encounter (Signed)
Received request from pharmacy for PA on Januvia.   PA submitted.   Dx: DM( E11.9)

## 2014-09-29 NOTE — Telephone Encounter (Signed)
Received PA determination.   PA approved through 08/26/2015.  Ref # [id number].

## 2014-10-05 ENCOUNTER — Ambulatory Visit (INDEPENDENT_AMBULATORY_CARE_PROVIDER_SITE_OTHER): Payer: Medicare Other | Admitting: Family Medicine

## 2014-10-05 ENCOUNTER — Encounter: Payer: Self-pay | Admitting: Family Medicine

## 2014-10-05 VITALS — BP 128/64 | HR 76 | Temp 98.3°F | Resp 18 | Ht 61.0 in | Wt 125.0 lb

## 2014-10-05 DIAGNOSIS — E119 Type 2 diabetes mellitus without complications: Secondary | ICD-10-CM | POA: Diagnosis not present

## 2014-10-05 DIAGNOSIS — I10 Essential (primary) hypertension: Secondary | ICD-10-CM | POA: Diagnosis not present

## 2014-10-05 DIAGNOSIS — F0390 Unspecified dementia without behavioral disturbance: Secondary | ICD-10-CM

## 2014-10-05 LAB — COMPREHENSIVE METABOLIC PANEL
ALT: 9 U/L (ref 0–35)
AST: 10 U/L (ref 0–37)
Albumin: 4.1 g/dL (ref 3.5–5.2)
Alkaline Phosphatase: 66 U/L (ref 39–117)
BUN: 13 mg/dL (ref 6–23)
CHLORIDE: 99 meq/L (ref 96–112)
CO2: 32 mEq/L (ref 19–32)
CREATININE: 0.7 mg/dL (ref 0.50–1.10)
Calcium: 9.7 mg/dL (ref 8.4–10.5)
Glucose, Bld: 160 mg/dL — ABNORMAL HIGH (ref 70–99)
Potassium: 4.7 mEq/L (ref 3.5–5.3)
Sodium: 139 mEq/L (ref 135–145)
Total Bilirubin: 0.4 mg/dL (ref 0.2–1.2)
Total Protein: 6.3 g/dL (ref 6.0–8.3)

## 2014-10-05 LAB — CBC WITH DIFFERENTIAL/PLATELET
BASOS ABS: 0 10*3/uL (ref 0.0–0.1)
Basophils Relative: 0 % (ref 0–1)
Eosinophils Absolute: 0.3 10*3/uL (ref 0.0–0.7)
Eosinophils Relative: 4 % (ref 0–5)
HCT: 42.8 % (ref 36.0–46.0)
Hemoglobin: 14.2 g/dL (ref 12.0–15.0)
LYMPHS PCT: 26 % (ref 12–46)
Lymphs Abs: 2.1 10*3/uL (ref 0.7–4.0)
MCH: 28.1 pg (ref 26.0–34.0)
MCHC: 33.2 g/dL (ref 30.0–36.0)
MCV: 84.6 fL (ref 78.0–100.0)
MPV: 12.4 fL (ref 8.6–12.4)
Monocytes Absolute: 0.6 10*3/uL (ref 0.1–1.0)
Monocytes Relative: 8 % (ref 3–12)
NEUTROS ABS: 5 10*3/uL (ref 1.7–7.7)
Neutrophils Relative %: 62 % (ref 43–77)
PLATELETS: 159 10*3/uL (ref 150–400)
RBC: 5.06 MIL/uL (ref 3.87–5.11)
RDW: 14.7 % (ref 11.5–15.5)
WBC: 8 10*3/uL (ref 4.0–10.5)

## 2014-10-05 LAB — HEMOGLOBIN A1C
HEMOGLOBIN A1C: 9.3 % — AB (ref <5.7)
MEAN PLASMA GLUCOSE: 220 mg/dL — AB (ref <117)

## 2014-10-05 MED ORDER — METFORMIN HCL 1000 MG PO TABS: 1000.0000 mg | ORAL_TABLET | Freq: Two times a day (BID) | ORAL | Status: DC

## 2014-10-05 MED ORDER — SITAGLIPTIN PHOSPHATE 100 MG PO TABS: 100.0000 mg | ORAL_TABLET | Freq: Every day | ORAL | Status: DC

## 2014-10-05 NOTE — Patient Instructions (Signed)
Changes made to MTF sent to pharmacy and Januvia We will call with labs or any further changes F/U 3 months

## 2014-10-05 NOTE — Assessment & Plan Note (Signed)
No behavioral problems, no significant decline recently

## 2014-10-05 NOTE — Assessment & Plan Note (Signed)
Well controlled continue meds 

## 2014-10-05 NOTE — Assessment & Plan Note (Signed)
Continue current meds goal A1C < 8% based on age and risk factors Labs today

## 2014-10-05 NOTE — Progress Notes (Signed)
Patient ID: Kristin Grant, female   DOB: [birth year], 74 y.o.   MRN: [id number]   Subjective:    Patient ID: Kristin Grant, female    DOB: [birth year], 74 y.o.   MRN: [id number]  Patient presents for 3 month F/U Pt here to f/u chronic problems no concerns DM- last A1C 8% on januvia 100mg  and Metformin 1000mg  bid, no hypoglycema, no CBG readings from ALF today. Eating well, maintianing weight, no falls Walks with walker . Seen by podiatrist at the facility    Review Of Systems:  GEN- denies fatigue, fever, weight loss,weakness, recent illness HEENT- denies eye drainage, change in vision, nasal discharge, CVS- denies chest pain, palpitations RESP- denies SOB, cough, wheeze ABD- denies N/V, change in stools, abd pain GU- denies dysuria, hematuria, dribbling, incontinence MSK- denies joint pain, muscle aches, injury Neuro- denies headache, dizziness, syncope, seizure activity       Objective:    BP 128/64 mmHg  Pulse 76  Temp(Src) 98.3 F (36.8 C) (Oral)  Resp 18  Ht 5\' 1"  (1.549 m)  Wt 125 lb (56.7 kg)  BMI 23.63 kg/m2 GEN- NAD, alert and oriented x3 HEENT- PERRL, EOMI, non injected sclera, pink conjunctiva, MMM, oropharynx clear, poor dentition CVS- RRR, no murmur RESP-CTAB ABD-NABS,soft,NT,ND EXT- No edema Pulses- Radial, DP- 2+        Assessment & Plan:      Problem List Items Addressed This Visit      Unprioritized   Hypertension - Primary   Relevant Orders   CBC with Differential/Platelet   Comprehensive metabolic panel   Diabetes mellitus, type II   Relevant Medications   sitaGLIPtin (JANUVIA) tablet   metFORMIN (GLUCOPHAGE) tablet   Other Relevant Orders   Hemoglobin A1c   Microalbumin / creatinine urine ratio   Dementia      Note: This dictation was prepared with Dragon dictation along with smaller phrase technology. Any transcriptional errors that result from this process are unintentional.

## 2014-10-20 ENCOUNTER — Other Ambulatory Visit: Payer: Self-pay | Admitting: *Deleted

## 2014-10-20 MED ORDER — INSULIN GLARGINE 100 UNIT/ML ~~LOC~~ SOLN
5.0000 [IU] | Freq: Every day | SUBCUTANEOUS | Status: DC
Start: 2014-10-20 — End: 2015-06-16

## 2014-11-05 ENCOUNTER — Other Ambulatory Visit: Payer: Self-pay | Admitting: Family Medicine

## 2014-11-07 NOTE — Telephone Encounter (Signed)
Medication refilled per protocol. 

## 2014-12-19 DIAGNOSIS — M79674 Pain in right toe(s): Secondary | ICD-10-CM | POA: Diagnosis not present

## 2014-12-19 DIAGNOSIS — M79675 Pain in left toe(s): Secondary | ICD-10-CM | POA: Diagnosis not present

## 2014-12-19 DIAGNOSIS — B351 Tinea unguium: Secondary | ICD-10-CM | POA: Diagnosis not present

## 2015-01-04 ENCOUNTER — Ambulatory Visit (INDEPENDENT_AMBULATORY_CARE_PROVIDER_SITE_OTHER): Payer: Medicare Other | Admitting: Family Medicine

## 2015-01-04 ENCOUNTER — Encounter: Payer: Self-pay | Admitting: Family Medicine

## 2015-01-04 VITALS — BP 128/78 | HR 68 | Temp 98.1°F | Resp 14 | Ht 61.0 in | Wt 115.0 lb

## 2015-01-04 DIAGNOSIS — E119 Type 2 diabetes mellitus without complications: Secondary | ICD-10-CM

## 2015-01-04 DIAGNOSIS — F0390 Unspecified dementia without behavioral disturbance: Secondary | ICD-10-CM

## 2015-01-04 DIAGNOSIS — E038 Other specified hypothyroidism: Secondary | ICD-10-CM | POA: Diagnosis not present

## 2015-01-04 DIAGNOSIS — I10 Essential (primary) hypertension: Secondary | ICD-10-CM

## 2015-01-04 DIAGNOSIS — R634 Abnormal weight loss: Secondary | ICD-10-CM

## 2015-01-04 LAB — COMPREHENSIVE METABOLIC PANEL
ALT: 9 U/L (ref 0–35)
AST: 9 U/L (ref 0–37)
Albumin: 3.8 g/dL (ref 3.5–5.2)
Alkaline Phosphatase: 58 U/L (ref 39–117)
BUN: 12 mg/dL (ref 6–23)
CALCIUM: 9.6 mg/dL (ref 8.4–10.5)
CHLORIDE: 102 meq/L (ref 96–112)
CO2: 31 mEq/L (ref 19–32)
Creat: 0.68 mg/dL (ref 0.50–1.10)
GLUCOSE: 116 mg/dL — AB (ref 70–99)
Potassium: 4.6 mEq/L (ref 3.5–5.3)
SODIUM: 142 meq/L (ref 135–145)
TOTAL PROTEIN: 6.1 g/dL (ref 6.0–8.3)
Total Bilirubin: 0.3 mg/dL (ref 0.2–1.2)

## 2015-01-04 LAB — HEMOGLOBIN A1C
HEMOGLOBIN A1C: 6.4 % — AB (ref <5.7)
MEAN PLASMA GLUCOSE: 137 mg/dL — AB (ref <117)

## 2015-01-04 LAB — CBC WITH DIFFERENTIAL/PLATELET
BASOS ABS: 0 10*3/uL (ref 0.0–0.1)
Basophils Relative: 0 % (ref 0–1)
EOS ABS: 0.4 10*3/uL (ref 0.0–0.7)
EOS PCT: 5 % (ref 0–5)
HEMATOCRIT: 41.3 % (ref 36.0–46.0)
Hemoglobin: 13.6 g/dL (ref 12.0–15.0)
LYMPHS PCT: 23 % (ref 12–46)
Lymphs Abs: 1.8 10*3/uL (ref 0.7–4.0)
MCH: 28 pg (ref 26.0–34.0)
MCHC: 32.9 g/dL (ref 30.0–36.0)
MCV: 85.2 fL (ref 78.0–100.0)
MONO ABS: 0.7 10*3/uL (ref 0.1–1.0)
MPV: 11.8 fL (ref 8.6–12.4)
Monocytes Relative: 9 % (ref 3–12)
Neutro Abs: 5 10*3/uL (ref 1.7–7.7)
Neutrophils Relative %: 63 % (ref 43–77)
Platelets: 158 10*3/uL (ref 150–400)
RBC: 4.85 MIL/uL (ref 3.87–5.11)
RDW: 14 % (ref 11.5–15.5)
WBC: 7.9 10*3/uL (ref 4.0–10.5)

## 2015-01-04 LAB — LIPID PANEL
CHOLESTEROL: 102 mg/dL (ref 0–200)
HDL: 37 mg/dL — ABNORMAL LOW (ref >=46)
LDL Cholesterol: 28 mg/dL (ref 0–99)
Total CHOL/HDL Ratio: 2.8 Ratio
Triglycerides: 183 mg/dL — ABNORMAL HIGH (ref <150)
VLDL: 37 mg/dL (ref 0–40)

## 2015-01-04 LAB — T3, FREE: T3, Free: 2.5 pg/mL (ref 2.3–4.2)

## 2015-01-04 LAB — TSH: TSH: 3.54 u[IU]/mL (ref 0.350–4.500)

## 2015-01-04 LAB — T4, FREE: FREE T4: 1.42 ng/dL (ref 0.80–1.80)

## 2015-01-04 NOTE — Assessment & Plan Note (Signed)
Blood pressure is well controlled 

## 2015-01-04 NOTE — Progress Notes (Signed)
Patient ID: Kristin Grant, female   DOB: [birth year], 74 y.o.   MRN: [id number]   Subjective:    Patient ID: Kristin Grant, female    DOB: [birth year], 74 y.o.   MRN: [id number]  Patient presents for 3 month F/U  patient to follow-up chronic medical problems. She is currently at assisted living facility she is doing well. On review of her weight still and it is a 10 pound weight loss I asked the nurses with her today she states that she does not eat very much she does drink Ensure once a day area. After the last visit her A1c returned quite elevated and I started her on Lantus 5 units at bedtime her blood sugars fasting have been 90-139, pre dinner 80-130, before dinner 140-238  No hypoglycemia No behavioral problems    Review Of Systems:  GEN- denies fatigue, fever, weight loss,weakness, recent illness HEENT- denies eye drainage, change in vision, nasal discharge, CVS- denies chest pain, palpitations RESP- denies SOB, cough, wheeze ABD- denies N/V, change in stools, abd pain GU- denies dysuria, hematuria, dribbling, incontinence MSK- denies joint pain, muscle aches, injury Neuro- denies headache, dizziness, syncope, seizure activity       Objective:    BP 128/78 mmHg  Pulse 68  Temp(Src) 98.1 F (36.7 C) (Oral)  Resp 14  Ht 5\' 1"  (1.549 m)  Wt 115 lb (52.164 kg)  BMI 21.74 kg/m2 GEN- NAD, alert and oriented x3 HEENT- PERRL, EOMI, non injected sclera, pink conjunctiva, MMM, oropharynx clear Neck- Supple, no thyromegaly CVS- RRR, no murmur RESP-CTAB ABD-NABS,soft,NT,ND EXT- No edema Pulses- Radial, DP- 2+        Assessment & Plan:      Problem List Items Addressed This Visit    Hypothyroidism   Relevant Orders   TSH   T3, free   T4, free   Hypertension - Primary   Relevant Orders   CBC with Differential/Platelet   Comprehensive metabolic panel   Diabetes mellitus, type II   Relevant Orders   Hemoglobin A1c   Lipid panel   Dementia      Note: This dictation  was prepared with Dragon dictation along with smaller phrase technology. Any transcriptional errors that result from this process are unintentional.

## 2015-01-04 NOTE — Patient Instructions (Signed)
Increase ensure to to three times a day  Continue current medications We will call with lab results Eye doctor appointment  F/U 2 months

## 2015-01-04 NOTE — Assessment & Plan Note (Signed)
Recheck TFT make sure this is not contributing to her weight loss

## 2015-01-04 NOTE — Assessment & Plan Note (Signed)
Weight down 10lbs, will increase ensure to TID dosing, recheck weight in 8 weeks, encourage eating

## 2015-01-04 NOTE — Assessment & Plan Note (Addendum)
Her predinner sugars are little elevated however the rest are very well controlled. I will repeat an A1c and adjust her insulin as needed the last A1c was 9.3% Patient needs an eye doctor appointment this will be scheduled by the facility Unable to get urine micro-samples and she has incontinence

## 2015-01-05 ENCOUNTER — Encounter: Payer: Self-pay | Admitting: *Deleted

## 2015-03-13 ENCOUNTER — Encounter: Payer: Self-pay | Admitting: Family Medicine

## 2015-03-13 ENCOUNTER — Ambulatory Visit (INDEPENDENT_AMBULATORY_CARE_PROVIDER_SITE_OTHER): Payer: Medicare Other | Admitting: Family Medicine

## 2015-03-13 VITALS — BP 124/68 | HR 78 | Temp 98.2°F | Resp 16 | Ht 61.0 in | Wt 125.0 lb

## 2015-03-13 DIAGNOSIS — M79675 Pain in left toe(s): Secondary | ICD-10-CM | POA: Diagnosis not present

## 2015-03-13 DIAGNOSIS — F0390 Unspecified dementia without behavioral disturbance: Secondary | ICD-10-CM | POA: Diagnosis not present

## 2015-03-13 DIAGNOSIS — E119 Type 2 diabetes mellitus without complications: Secondary | ICD-10-CM

## 2015-03-13 DIAGNOSIS — B351 Tinea unguium: Secondary | ICD-10-CM | POA: Diagnosis not present

## 2015-03-13 DIAGNOSIS — M79672 Pain in left foot: Secondary | ICD-10-CM | POA: Diagnosis not present

## 2015-03-13 DIAGNOSIS — E46 Unspecified protein-calorie malnutrition: Secondary | ICD-10-CM

## 2015-03-13 NOTE — Assessment & Plan Note (Signed)
Weight improved 10lbs, I will change her shake supplement to twice a day, she does not like to drink three times a day. Continue to monitor weight, if needed add remeron 7.5mg 

## 2015-03-13 NOTE — Progress Notes (Signed)
Patient ID: Kristin Grant, female   DOB: Mar 08, 1941, 74 y.o.   MRN: [id number]   Subjective:    Patient ID: Kristin BirchElsine Gang, female    DOB: Mar 08, 1941, 74 y.o.   MRN: [id number]  Patient presents for 2 month F/U patient here for interim follow-up weight. Her last visit she has lost 10 pounds. I started her on the ensure equivalent at her assisted living facility 3 times a day. Her weight is now up to 10 pounds. Her diabetes and blood pressure well controlled. No particular concerns today. She has not had any falls denies any pain today.    Review Of Systems:  GEN- denies fatigue, fever, weight loss,weakness, recent illness HEENT- denies eye drainage, change in vision, nasal discharge, CVS- denies chest pain, palpitations RESP- denies SOB, cough, wheeze ABD- denies N/V, change in stools, abd pain Neuro- denies headache, dizziness, syncope, seizure activity       Objective:    BP 124/68 mmHg  Pulse 78  Temp(Src) 98.2 F (36.8 C) (Oral)  Resp 16  Ht 5\' 1"  (1.549 m)  Wt 125 lb (56.7 kg)  BMI 23.63 kg/m2 GEN- NAD, alert and oriented x3 HEENT- PERRL, EOMI, non injected sclera, pink conjunctiva, MMM, oropharynx clear CVS- RRR, no murmur RESP-CTAB ABD-NABS,soft,NT,ND  EXT- No edema Pulses- Radial - 2+        Assessment & Plan:      Problem List Items Addressed This Visit    None      Note: This dictation was prepared with Dragon dictation along with smaller phrase technology. Any transcriptional errors that result from this process are unintentional.

## 2015-03-13 NOTE — Patient Instructions (Signed)
Decrease carnation shakes to twice a day Eye doctor appointment  F/U 3 month s

## 2015-03-13 NOTE — Assessment & Plan Note (Signed)
Well controlled, no change to meds, A1C at goal, recheck at next visit

## 2015-03-24 ENCOUNTER — Other Ambulatory Visit: Payer: Self-pay | Admitting: Family Medicine

## 2015-03-24 NOTE — Telephone Encounter (Signed)
Medication refilled per protocol. 

## 2015-05-10 DIAGNOSIS — H52223 Regular astigmatism, bilateral: Secondary | ICD-10-CM | POA: Diagnosis not present

## 2015-05-10 DIAGNOSIS — H40003 Preglaucoma, unspecified, bilateral: Secondary | ICD-10-CM | POA: Diagnosis not present

## 2015-05-10 DIAGNOSIS — H5203 Hypermetropia, bilateral: Secondary | ICD-10-CM | POA: Diagnosis not present

## 2015-05-10 DIAGNOSIS — H524 Presbyopia: Secondary | ICD-10-CM | POA: Diagnosis not present

## 2015-05-10 LAB — HM DIABETES EYE EXAM

## 2015-05-12 ENCOUNTER — Encounter: Payer: Self-pay | Admitting: *Deleted

## 2015-05-23 ENCOUNTER — Encounter: Payer: Self-pay | Admitting: Family Medicine

## 2015-06-05 DIAGNOSIS — B351 Tinea unguium: Secondary | ICD-10-CM | POA: Diagnosis not present

## 2015-06-05 DIAGNOSIS — M79675 Pain in left toe(s): Secondary | ICD-10-CM | POA: Diagnosis not present

## 2015-06-05 DIAGNOSIS — M79672 Pain in left foot: Secondary | ICD-10-CM | POA: Diagnosis not present

## 2015-06-14 ENCOUNTER — Encounter: Payer: Self-pay | Admitting: Family Medicine

## 2015-06-14 ENCOUNTER — Ambulatory Visit (INDEPENDENT_AMBULATORY_CARE_PROVIDER_SITE_OTHER): Payer: Medicare Other | Admitting: Family Medicine

## 2015-06-14 VITALS — BP 120/70 | HR 72 | Temp 98.2°F | Resp 14 | Ht 61.0 in | Wt 116.0 lb

## 2015-06-14 DIAGNOSIS — F0390 Unspecified dementia without behavioral disturbance: Secondary | ICD-10-CM | POA: Diagnosis not present

## 2015-06-14 DIAGNOSIS — E038 Other specified hypothyroidism: Secondary | ICD-10-CM

## 2015-06-14 DIAGNOSIS — E119 Type 2 diabetes mellitus without complications: Secondary | ICD-10-CM

## 2015-06-14 DIAGNOSIS — E46 Unspecified protein-calorie malnutrition: Secondary | ICD-10-CM

## 2015-06-14 DIAGNOSIS — Z794 Long term (current) use of insulin: Secondary | ICD-10-CM | POA: Diagnosis not present

## 2015-06-14 DIAGNOSIS — Z23 Encounter for immunization: Secondary | ICD-10-CM | POA: Diagnosis not present

## 2015-06-14 LAB — CBC WITH DIFFERENTIAL/PLATELET
Basophils Absolute: 0 10*3/uL (ref 0.0–0.1)
Basophils Relative: 0 % (ref 0–1)
EOS PCT: 5 % (ref 0–5)
Eosinophils Absolute: 0.4 10*3/uL (ref 0.0–0.7)
HCT: 41.7 % (ref 36.0–46.0)
Hemoglobin: 13.5 g/dL (ref 12.0–15.0)
LYMPHS PCT: 27 % (ref 12–46)
Lymphs Abs: 2.1 10*3/uL (ref 0.7–4.0)
MCH: 28 pg (ref 26.0–34.0)
MCHC: 32.4 g/dL (ref 30.0–36.0)
MCV: 86.5 fL (ref 78.0–100.0)
MONO ABS: 0.7 10*3/uL (ref 0.1–1.0)
MPV: 12.5 fL — ABNORMAL HIGH (ref 8.6–12.4)
Monocytes Relative: 9 % (ref 3–12)
Neutro Abs: 4.7 10*3/uL (ref 1.7–7.7)
Neutrophils Relative %: 59 % (ref 43–77)
PLATELETS: 179 10*3/uL (ref 150–400)
RBC: 4.82 MIL/uL (ref 3.87–5.11)
RDW: 14.1 % (ref 11.5–15.5)
WBC: 7.9 10*3/uL (ref 4.0–10.5)

## 2015-06-14 LAB — HEMOGLOBIN A1C
Hgb A1c MFr Bld: 5.7 % — ABNORMAL HIGH (ref <5.7)
Mean Plasma Glucose: 117 mg/dL — ABNORMAL HIGH (ref <117)

## 2015-06-14 LAB — COMPREHENSIVE METABOLIC PANEL
ALBUMIN: 3.9 g/dL (ref 3.6–5.1)
ALT: 8 U/L (ref 6–29)
AST: 11 U/L (ref 10–35)
Alkaline Phosphatase: 51 U/L (ref 33–130)
BUN: 16 mg/dL (ref 7–25)
CO2: 32 mmol/L — AB (ref 20–31)
CREATININE: 0.76 mg/dL (ref 0.60–0.93)
Calcium: 9.4 mg/dL (ref 8.6–10.4)
Chloride: 101 mmol/L (ref 98–110)
Glucose, Bld: 91 mg/dL (ref 70–99)
POTASSIUM: 4.2 mmol/L (ref 3.5–5.3)
SODIUM: 140 mmol/L (ref 135–146)
Total Bilirubin: 0.3 mg/dL (ref 0.2–1.2)
Total Protein: 6.2 g/dL (ref 6.1–8.1)

## 2015-06-14 LAB — T3, FREE: T3, Free: 2.3 pg/mL (ref 2.3–4.2)

## 2015-06-14 LAB — TSH: TSH: 3.288 u[IU]/mL (ref 0.350–4.500)

## 2015-06-14 MED ORDER — MIRTAZAPINE 7.5 MG PO TABS: 7.5000 mg | ORAL_TABLET | Freq: Every day | ORAL | Status: DC

## 2015-06-14 NOTE — Assessment & Plan Note (Addendum)
Diabetes has been well-controlled along with her weight loss may need to adjust her medications she's on Lantus 5 units at bedtime along with metformin and Januvia. Recheck A1c today Also had a facility fax me the CBGs that I can review these have not received any messages about any hypoglycemia.  No urine microalbumin as she is incontinent

## 2015-06-14 NOTE — Patient Instructions (Signed)
Start Remeron 7.5mg  once a day  Continue current medications Fax me the Blood sugar log  Flu shot given We will call with labs F/U 3 months

## 2015-06-14 NOTE — Assessment & Plan Note (Addendum)
We will contact the facility to see if she is having problems with swallowing or what the issue is. She is on the insurer twice a day. Let us start Remeron 7.5 mg at bedtime to assist with her appetite.

## 2015-06-14 NOTE — Assessment & Plan Note (Signed)
Recheck thyroid function with the change in her weight. She is not having any other hyperthyroid symptoms.

## 2015-06-14 NOTE — Progress Notes (Signed)
Patient ID: Kristin Grant, female   DOB: 09-30-1940, 74 y.o.   MRN: [id number]   Subjective:    Patient ID: Kristin Grant, female    DOB: 09-30-1940, 74 y.o.   MRN: [id number]  Patient presents for 3 month F/U  patient here to follow-up chronic medical problems. I did receive a fax from the facility this morning asking to crush her meds in applesauce however she denies any problems swallowing with her eating. Her weight is down another 9 pounds after she gained 10 pounds with the use of an sure. She states she does not like the food that they serve . Her aide that is with her today states that she does not eat very well. She denies any chest pain no abdominal pain. I reviewed her medications and also her FL2 and other assisted living forms were completed during the visit    Review Of Systems:  GEN- denies fatigue, fever, +weight loss,weakness, recent illness HEENT- denies eye drainage, change in vision, nasal discharge, CVS- denies chest pain, palpitations RESP- denies SOB, cough, wheeze ABD- denies N/V, change in stools, abd pain GU- denies dysuria, hematuria, dribbling, incontinence MSK- denies joint pain, muscle aches, injury Neuro- denies headache, dizziness, syncope, seizure activity       Objective:    BP 120/70 mmHg  Pulse 72  Temp(Src) 98.2 F (36.8 C) (Oral)  Resp 14  Ht 5\' 1"  (1.549 m)  Wt 116 lb (52.617 kg)  BMI 21.93 kg/m2 GEN- NAD, alert and oriented x3 HEENT- PERRL, EOMI, non injected sclera, pink conjunctiva, MMM, oropharynx clear Neck- Supple, no thyromegaly CVS- RRR, no murmur RESP-CTAB ABD-NABS,soft,NT,ND EXT- No edema Pulses- Radial, DP- 2+        Assessment & Plan:      Problem List Items Addressed This Visit    Protein-calorie malnutrition (HCC)    We will contact the facility to see if she is having problems with swallowing or what the issue is. She is on the insurer twice a day. Let us start Remeron 7.5 mg at bedtime to assist with her appetite.      Relevant Orders   CBC with Differential/Platelet   Comprehensive metabolic panel   Hypothyroidism    Recheck thyroid function with the change in her weight. She is not having any other hyperthyroid symptoms.      Relevant Orders   TSH   T3, free   Diabetes mellitus, type II (HCC)    Diabetes has been well-controlled along with her weight loss may need to adjust her medications she's on Lantus 5 units at bedtime along with metformin and Januvia. Recheck A1c today Also had a facility fax me the CBGs that I can review these have not received any messages about any hypoglycemia.      Relevant Orders   Hemoglobin A1c   Dementia   Relevant Medications   mirtazapine (REMERON) 7.5 MG tablet    Other Visit Diagnoses    Need for prophylactic vaccination and inoculation against influenza    -  Primary    Relevant Orders    Flu Vaccine QUAD 36+ mos PF IM (Fluarix & Fluzone Quad PF) (Completed)       Note: This dictation was prepared with Dragon dictation along with smaller phrase technology. Any transcriptional errors that result from this process are unintentional.

## 2015-06-16 ENCOUNTER — Telehealth: Payer: Self-pay | Admitting: Family Medicine

## 2015-06-16 ENCOUNTER — Other Ambulatory Visit: Payer: Self-pay | Admitting: *Deleted

## 2015-06-16 NOTE — Telephone Encounter (Signed)
-----   Message from Durwin Norahristina H Six, LPN sent at 78/29/562110/20/2016  1:01 PM EDT ----- [facility].   [name] reports that patient does not eat very well. She will only eat approximately 50% of each meal. States that she does drink 100% of [employer] Instant Breakfast with each meal since she can't afford Ensure. Also reports that patient does have snacks provided by family in room.   Spoke with [name] in regards to insulin administration. Advised to have MT's inject insulin in either abdomen or outer thigh.

## 2015-08-15 DIAGNOSIS — H50112 Monocular exotropia, left eye: Secondary | ICD-10-CM | POA: Diagnosis not present

## 2015-08-15 DIAGNOSIS — H40013 Open angle with borderline findings, low risk, bilateral: Secondary | ICD-10-CM | POA: Diagnosis not present

## 2015-08-15 DIAGNOSIS — H53032 Strabismic amblyopia, left eye: Secondary | ICD-10-CM | POA: Diagnosis not present

## 2015-08-15 DIAGNOSIS — H40003 Preglaucoma, unspecified, bilateral: Secondary | ICD-10-CM | POA: Diagnosis not present

## 2015-09-12 DIAGNOSIS — M79674 Pain in right toe(s): Secondary | ICD-10-CM | POA: Diagnosis not present

## 2015-09-12 DIAGNOSIS — B351 Tinea unguium: Secondary | ICD-10-CM | POA: Diagnosis not present

## 2015-09-13 ENCOUNTER — Ambulatory Visit: Payer: Medicare Other | Admitting: Family Medicine

## 2015-09-13 ENCOUNTER — Telehealth: Payer: Self-pay | Admitting: *Deleted

## 2015-09-13 NOTE — Telephone Encounter (Signed)
Received request from pharmacy for PA on Nexium.  PA submitted.   Dx: K21.9- GERD. 

## 2015-09-13 NOTE — Telephone Encounter (Signed)
Received request from pharmacy for PA on Januvia.   PA submitted.   Dx: E11.9- DM, type 2

## 2015-09-14 NOTE — Telephone Encounter (Signed)
Received PA determination.   PA approved 09/13/2015- 08/25/2016.  Ref # [id number].  Pharmacy made aware.

## 2015-09-15 MED ORDER — DEXLANSOPRAZOLE 60 MG PO CPDR: 60.0000 mg | DELAYED_RELEASE_CAPSULE | Freq: Every day | ORAL | Status: DC

## 2015-09-15 NOTE — Telephone Encounter (Signed)
Order faxed to facility.   Prescription sent to pharmacy.

## 2015-09-15 NOTE — Telephone Encounter (Signed)
Received PA determination.   PA denied.   Patient must try and fail Dexilant.   MD please advise.  

## 2015-09-15 NOTE — Telephone Encounter (Signed)
Ok dexilant 60 poqday

## 2015-09-20 ENCOUNTER — Ambulatory Visit (INDEPENDENT_AMBULATORY_CARE_PROVIDER_SITE_OTHER): Payer: Medicare Other | Admitting: Family Medicine

## 2015-09-20 ENCOUNTER — Encounter: Payer: Self-pay | Admitting: Family Medicine

## 2015-09-20 VITALS — BP 136/70 | HR 74 | Temp 98.2°F | Resp 16 | Ht 62.0 in | Wt 120.0 lb

## 2015-09-20 DIAGNOSIS — Z794 Long term (current) use of insulin: Secondary | ICD-10-CM | POA: Diagnosis not present

## 2015-09-20 DIAGNOSIS — F0390 Unspecified dementia without behavioral disturbance: Secondary | ICD-10-CM

## 2015-09-20 DIAGNOSIS — I10 Essential (primary) hypertension: Secondary | ICD-10-CM | POA: Diagnosis not present

## 2015-09-20 DIAGNOSIS — E038 Other specified hypothyroidism: Secondary | ICD-10-CM | POA: Diagnosis not present

## 2015-09-20 DIAGNOSIS — E119 Type 2 diabetes mellitus without complications: Secondary | ICD-10-CM

## 2015-09-20 LAB — COMPREHENSIVE METABOLIC PANEL
ALBUMIN: 3.9 g/dL (ref 3.6–5.1)
ALT: 9 U/L (ref 6–29)
AST: 10 U/L (ref 10–35)
Alkaline Phosphatase: 55 U/L (ref 33–130)
BUN: 16 mg/dL (ref 7–25)
CALCIUM: 9.2 mg/dL (ref 8.6–10.4)
CHLORIDE: 98 mmol/L (ref 98–110)
CO2: 31 mmol/L (ref 20–31)
CREATININE: 0.62 mg/dL (ref 0.60–0.93)
GLUCOSE: 129 mg/dL — AB (ref 70–99)
Potassium: 4 mmol/L (ref 3.5–5.3)
SODIUM: 138 mmol/L (ref 135–146)
Total Bilirubin: 0.2 mg/dL (ref 0.2–1.2)
Total Protein: 6.3 g/dL (ref 6.1–8.1)

## 2015-09-20 LAB — HEMOGLOBIN A1C
Hgb A1c MFr Bld: 6.1 % — ABNORMAL HIGH (ref <5.7)
MEAN PLASMA GLUCOSE: 128 mg/dL — AB (ref <117)

## 2015-09-20 NOTE — Assessment & Plan Note (Signed)
Blood pressure well controlled at change of medication 

## 2015-09-20 NOTE — Assessment & Plan Note (Signed)
No longer require insulin therapy. I mean to recheck her A1c today. She will continue the 2 oral medications for now.

## 2015-09-20 NOTE — Assessment & Plan Note (Signed)
She is doing well. There's been no significant decline in her mental status. She is on the Remeron to help with her appetite.

## 2015-09-20 NOTE — Progress Notes (Signed)
Patient ID: Kristin Grant, female   DOB: Jan 02, 1941, 75 y.o.   MRN: [id number]   Subjective:    Patient ID: Kristin Grant, female    DOB: 12-Jul-1941, 75 y.o.   MRN: [id number]  Patient presents for Medication Management and Medication Refill  Patient here to follow-up on medical problems. She is no particular concerns. I discontinued her Lantus after her A1c returned at less than 6%, she is still on Januvia and metformin. Her appetite is still about the same yet he she has gained 4 pounds. She is also drinking ensure.  On review of her medications Nexium was no longer cover. She was changed to Dexilant  He is not had the recent illness no falls at the facility.  Review Of Systems:  GEN- denies fatigue, fever, weight loss,weakness, recent illness HEENT- denies eye drainage, change in vision, nasal discharge, CVS- denies chest pain, palpitations RESP- denies SOB, cough, wheeze ABD- denies N/V, change in stools, abd pain GU- denies dysuria, hematuria, dribbling, incontinence MSK- denies joint pain, muscle aches, injury Neuro- denies headache, dizziness, syncope, seizure activity       Objective:    BP 136/70 mmHg  Pulse 74  Temp(Src) 98.2 F (36.8 C) (Oral)  Resp 16  Ht  (1.575 m)  Wt 120 lb (54.432 kg)  BMI 21.94 kg/m2 GEN- NAD, alert and oriented x3 HEENT- PERRL, EOMI, non injected sclera, pink conjunctiva, MMM, oropharynx clear Neck- Supple, no thyromegaly CVS- RRR, no murmur RESP-CTAB ABD-NABS,soft,NT,ND EXT- No edema Pulses- Radial, DP- 2+        Assessment & Plan:      Problem List Items Addressed This Visit    Hypothyroidism   Hypertension   Diabetes mellitus, type II (HCC) - Primary   Relevant Orders   Comprehensive metabolic panel   Hemoglobin A1c      Note: This dictation was prepared with Dragon dictation along with smaller phrase technology. Any transcriptional errors that result from this process are unintentional.

## 2015-09-20 NOTE — Patient Instructions (Signed)
New medication- dexilant to replace Nexium Continue all other meds We will call with labs F/U 4 months

## 2015-09-20 NOTE — Assessment & Plan Note (Signed)
Thyroid function tests at baseline no change in thyroid dose

## 2015-09-21 ENCOUNTER — Telehealth: Payer: Self-pay | Admitting: *Deleted

## 2015-09-21 ENCOUNTER — Encounter: Payer: Self-pay | Admitting: *Deleted

## 2015-09-21 NOTE — Telephone Encounter (Signed)
Received fax from insurance regarding the prescription for Nexium.Insurance will not cover this medication.   Noted that patient is no longer on Nexium. Patient is currently prescribed Dexilant.   Call placed to pharmacy to inquire.

## 2015-09-21 NOTE — Telephone Encounter (Signed)
Patient was started on Dexilant after PA denied.   Insurance will cover.

## 2015-09-25 ENCOUNTER — Telehealth: Payer: Self-pay | Admitting: Family Medicine

## 2015-09-25 NOTE — Telephone Encounter (Signed)
Lupita Leash with [facility] is calling to let Dr. Jeanice Lim know that Kristin Grant's blood sugar before lunch on Friday was 431. *no phone number was left on voicemail.

## 2015-09-25 NOTE — Telephone Encounter (Signed)
Agree no new orders needed

## 2015-09-25 NOTE — Telephone Encounter (Signed)
Call placed to [facility]. Spoke with [place], [place].   Reports that patient FSBS are as follows:               8am 12pm 4pm 09/22/2015:  [phone] 09/23/2015: 113 179 116 09/24/2015: 99 112 130 09/25/2015: 107  Advised to continue to monitor.   MD to be made aware.

## 2015-11-13 ENCOUNTER — Telehealth: Payer: Self-pay | Admitting: *Deleted

## 2015-11-13 ENCOUNTER — Emergency Department (HOSPITAL_COMMUNITY): Payer: Medicare Other

## 2015-11-13 ENCOUNTER — Emergency Department (HOSPITAL_COMMUNITY)
Admission: EM | Admit: 2015-11-13 | Discharge: 2015-11-13 | Disposition: A | Discharge status: Home / Self Care | Payer: Medicare Other | Attending: Emergency Medicine | Admitting: Emergency Medicine

## 2015-11-13 ENCOUNTER — Encounter (HOSPITAL_COMMUNITY): Payer: Self-pay | Admitting: Emergency Medicine

## 2015-11-13 DIAGNOSIS — Z7984 Long term (current) use of oral hypoglycemic drugs: Secondary | ICD-10-CM | POA: Insufficient documentation

## 2015-11-13 DIAGNOSIS — F0390 Unspecified dementia without behavioral disturbance: Secondary | ICD-10-CM | POA: Diagnosis not present

## 2015-11-13 DIAGNOSIS — R000 Tachycardia, unspecified: Secondary | ICD-10-CM | POA: Diagnosis not present

## 2015-11-13 DIAGNOSIS — Z8673 Personal history of transient ischemic attack (TIA), and cerebral infarction without residual deficits: Secondary | ICD-10-CM | POA: Insufficient documentation

## 2015-11-13 DIAGNOSIS — Z79899 Other long term (current) drug therapy: Secondary | ICD-10-CM | POA: Insufficient documentation

## 2015-11-13 DIAGNOSIS — E119 Type 2 diabetes mellitus without complications: Secondary | ICD-10-CM | POA: Diagnosis not present

## 2015-11-13 DIAGNOSIS — R4182 Altered mental status, unspecified: Secondary | ICD-10-CM | POA: Diagnosis present

## 2015-11-13 DIAGNOSIS — R410 Disorientation, unspecified: Secondary | ICD-10-CM | POA: Diagnosis not present

## 2015-11-13 DIAGNOSIS — I10 Essential (primary) hypertension: Secondary | ICD-10-CM | POA: Insufficient documentation

## 2015-11-13 DIAGNOSIS — I2510 Atherosclerotic heart disease of native coronary artery without angina pectoris: Secondary | ICD-10-CM | POA: Diagnosis not present

## 2015-11-13 LAB — CBC
HEMATOCRIT: 41.5 % (ref 36.0–46.0)
Hemoglobin: 13.6 g/dL (ref 12.0–15.0)
MCH: 28.2 pg (ref 26.0–34.0)
MCHC: 32.8 g/dL (ref 30.0–36.0)
MCV: 86.1 fL (ref 78.0–100.0)
PLATELETS: 190 10*3/uL (ref 150–400)
RBC: 4.82 MIL/uL (ref 3.87–5.11)
RDW: 13.5 % (ref 11.5–15.5)
WBC: 7.2 10*3/uL (ref 4.0–10.5)

## 2015-11-13 LAB — COMPREHENSIVE METABOLIC PANEL
ALK PHOS: 50 U/L (ref 38–126)
ALT: 11 U/L — ABNORMAL LOW (ref 14–54)
AST: 16 U/L (ref 15–41)
Albumin: 4.2 g/dL (ref 3.5–5.0)
Anion gap: 9 (ref 5–15)
BILIRUBIN TOTAL: 0.4 mg/dL (ref 0.3–1.2)
BUN: 17 mg/dL (ref 6–20)
CALCIUM: 9.2 mg/dL (ref 8.9–10.3)
CO2: 27 mmol/L (ref 22–32)
Chloride: 105 mmol/L (ref 101–111)
Creatinine, Ser: 0.82 mg/dL (ref 0.44–1.00)
GFR calc Af Amer: >60 mL/min (ref >60)
Glucose, Bld: 155 mg/dL — ABNORMAL HIGH (ref 65–99)
POTASSIUM: 4.3 mmol/L (ref 3.5–5.1)
Sodium: 141 mmol/L (ref 135–145)
TOTAL PROTEIN: 6.9 g/dL (ref 6.5–8.1)

## 2015-11-13 LAB — URINALYSIS, ROUTINE W REFLEX MICROSCOPIC
Bilirubin Urine: NEGATIVE
Glucose, UA: NEGATIVE mg/dL
Hgb urine dipstick: NEGATIVE
KETONES UR: NEGATIVE mg/dL
LEUKOCYTES UA: NEGATIVE
NITRITE: NEGATIVE
PH: 5 (ref 5.0–8.0)
PROTEIN: NEGATIVE mg/dL
Specific Gravity, Urine: 1.015 (ref 1.005–1.030)

## 2015-11-13 LAB — PROTIME-INR
INR: 1.05 (ref 0.00–1.49)
PROTHROMBIN TIME: 13.9 s (ref 11.6–15.2)

## 2015-11-13 LAB — TROPONIN I: Troponin I: <0.03 ng/mL (ref <0.031)

## 2015-11-13 LAB — CBG MONITORING, ED: GLUCOSE-CAPILLARY: 135 mg/dL — AB (ref 65–99)

## 2015-11-13 NOTE — ED Notes (Signed)
Pt from [facility]. Facility reports increased confusion and agitation over the past few days. Pt sent over by PCP for urinalysis and lab work. Pt is confused at baseline.

## 2015-11-13 NOTE — ED Notes (Signed)
Pt is alert, reports much better, and desires something to drink- She is conversant pleasantly confused and reports hungry- she is given ice chips with a bit of water and crackers- curtains are open for visualization and pt encouraged not to try to get up without assistance

## 2015-11-13 NOTE — Telephone Encounter (Signed)
Received call from [place] at [facility] needing VO for pt to do UA for possible UTI. Please advise  [phone]

## 2015-11-13 NOTE — ED Notes (Signed)
report to Lafonda MossesDiana, [facility] who understands discharge instructions and is sending someone to pick her up.

## 2015-11-13 NOTE — Telephone Encounter (Signed)
Please send her to ER to be evaluated, she has too many other medical problems than to rely on UA alone, she needs labs checked too.

## 2015-11-13 NOTE — Telephone Encounter (Signed)
Call placed to facility and [place], [place] made aware.

## 2015-11-13 NOTE — Telephone Encounter (Signed)
[facility]   Reports that patient has had x2 days of increased confusion and agitation.   Requested order for UA C&S to be sent to facility. At that time, patient can be taken to lab in [place] to have urine sample collected.   MD please advise.

## 2015-11-13 NOTE — ED Notes (Signed)
Pt has had water and crackers - she has had her troponin drawn and is awaiting ambulation

## 2015-11-13 NOTE — ED Provider Notes (Addendum)
CSN: [id number]     Arrival date & time 11/13/15  1506 History   First MD Initiated Contact with Patient 11/13/15 1646     Chief Complaint  Patient presents with  . Altered Mental Status     (Consider location/radiation/quality/duration/timing/severity/associated sxs/prior Treatment) HPI Comments: Patient from nursing home. She is not accompanied by anyone. The report is that she's had increasing confusion and agitation over the past several days. Her PCP referred her in for lab work and urinalysis. Patient denies any complaint. She states she feels fine. She's been eating and drinking well. No headache, chest pain, neck pain, abdominal pain, fever or chills. No focal weakness, numbness or tingling. She is ambulatory with her walker which is baseline.  D/w [employer] staff.  Patient has been more "agitated" over the past 2 days.  Called PCP to request UA and referred to the ED.  The history is provided by the patient. The history is limited by the condition of the patient.    Past Medical History  Diagnosis Date  . Diabetes mellitus   . Hypertension   . Reflux   . Coronary artery disease   . Alzheimer disease   . Arthritis   . Stroke ([place])   . Difficulty walking    History reviewed. No pertinent past surgical history. Family History  Problem Relation Age of Onset  . Hypertension Mother   . Heart disease Mother   . Hypertension Father   . Heart disease Father   . Hypertension Sister   . Heart disease Sister   . Cancer Sister     BREAST   . Hypertension Brother   . Heart disease Brother    Social History  Substance Use Topics  . Smoking status: Never Smoker   . Smokeless tobacco: Never Used  . Alcohol Use: No   OB History    No data available     Review of Systems  Unable to perform ROS: Dementia  Constitutional: Negative for fever, activity change and appetite change.  HENT: Negative for congestion.   Respiratory: Negative for cough, chest tightness and  shortness of breath.   Cardiovascular: Negative for chest pain.  Gastrointestinal: Negative for nausea, vomiting and abdominal pain.  Genitourinary: Negative for dysuria, hematuria, vaginal bleeding and vaginal discharge.  Musculoskeletal: Negative for myalgias and arthralgias.  Skin: Negative for rash.  Neurological: Negative for dizziness, weakness and headaches.    A complete 10 system review of systems was obtained and all systems are negative except as noted in the HPI and PMH.    Allergies  Review of patient's allergies indicates no known allergies.  Home Medications   Prior to Admission medications   Medication Sig Start Date End Date Taking? Authorizing Provider  aspirin 81 MG tablet Take 81 mg by mouth daily.    Historical Provider, MD  Blood Glucose Monitoring Suppl (BLOOD GLUCOSE MONITOR SYSTEM) W/DEVICE KIT 1 Units by Does not apply route daily. 07/19/13   [name], MD  clopidogrel (PLAVIX) 75 MG tablet TAKE 1 TABLET BY MOUTH ONCE DAILY. 08/09/13   [name], MD  dexlansoprazole (DEXILANT) 60 MG capsule Take 1 capsule (60 mg total) by mouth daily. 09/15/15   Susy Frizzle, MD  JANUVIA 100 MG tablet TAKE 1 TABLET BY MOUTH ONCE DAILY. 11/07/14   [name], MD  levothyroxine (SYNTHROID, LEVOTHROID) 50 MCG tablet TAKE 1 TABLET BY MOUTH ONCE DAILY. 08/09/13   [name], MD  lisinopril (PRINIVIL,ZESTRIL) 40 MG  tablet TAKE 1 TABLET BY MOUTH ONCE DAILY. 08/09/13   [name], MD  metFORMIN (GLUCOPHAGE) 1000 MG tablet Take 1 tablet (1,000 mg total) by mouth 2 (two) times daily with a meal. 10/05/14   [name], MD  mirtazapine (REMERON) 7.5 MG tablet Take 1 tablet (7.5 mg total) by mouth at bedtime. For appetite 06/14/15   [name], MD  Multiple Vitamin (DAILY VITE) TABS TAKE 1 TABLET BY MOUTH ONCE DAILY. 08/09/13   [name], MD  potassium chloride (MICRO-K) 10 MEQ CR capsule Take 10 mEq by mouth 2 (two) times daily.     Historical Provider, MD  propranolol (INDERAL) 40 MG tablet TAKE 1 TABLET BY MOUTH TWICE DAILY. 08/09/13   [name], MD  Q-PAP 500 MG tablet TAKE 1 TABLET BY MOUTH EVERY 6 HOURS AS NEEDED FOR PAIN OR FEVER. 10/04/13   [name], MD  QC PINK BISMUTH 262 MG/15ML suspension TAKE 1 TABLESPOONFUL (51m) BY MOUTH EVERY 6 HOURS AS NEEDED FOR STOMACH UPSET. 03/24/15   KAlycia Rossetti MD  traMADol (ULTRAM) 50 MG tablet TAKE 1 TABLET BY MOUTH EVERY 6 HOURS AS NEEDED FOR PAIN. 12/22/13   KAlycia Rossetti MD  TUSSIN DM 100-10 MG/5ML liquid TAKE 1 TEASPOONFUL (574m BY MOUTH EVER 4 HOURS AS NEEDED FOR COUGH. 08/30/14   KaAlycia RossettiMD   BP 146/98 mmHg  Pulse 98  Temp(Src) 98 F (36.7 C) (Oral)  Resp 18  Ht '5\' 5"'  (1.651 m)  Wt 120 lb (54.432 kg)  BMI 19.97 kg/m2  SpO2 97% Physical Exam  Constitutional: She is oriented to person, place, and time. She appears well-developed and well-nourished. No distress.  Oriented to person and place, not time  HENT:  Head: Normocephalic and atraumatic.  Mouth/Throat: Oropharynx is clear and moist. No oropharyngeal exudate.  Eyes: Conjunctivae and EOM are normal. Pupils are equal, round, and reactive to light.  L eye lateral deviation, chronic per report  Neck: Normal range of motion. Neck supple.  No meningismus.  Cardiovascular: Normal rate, normal heart sounds and intact distal pulses.   No murmur heard. Mild tachycardia  Pulmonary/Chest: Effort normal and breath sounds normal. No respiratory distress.  Abdominal: Soft. There is no tenderness. There is no rebound and no guarding.  Musculoskeletal: Normal range of motion. She exhibits no edema or tenderness.  Neurological: She is alert and oriented to person, place, and time. No cranial nerve deficit. She exhibits normal muscle tone. Coordination normal.  No ataxia on finger to nose bilaterally. No pronator drift. 5/5 strength throughout. CN 2-12 intact.Equal grip strength. Sensation intact.    Skin: Skin is warm.  Psychiatric: She has a normal mood and affect. Her behavior is normal.  Nursing note and vitals reviewed.   ED Course  Procedures (including critical care time) Labs Review Labs Reviewed  COMPREHENSIVE METABOLIC PANEL - Abnormal; Notable for the following:    Glucose, Bld 155 (*)    ALT 11 (*)    All other components within normal limits  CBG MONITORING, ED - Abnormal; Notable for the following:    Glucose-Capillary 135 (*)    All other components within normal limits  CBC  PROTIME-INR  URINALYSIS, ROUTINE W REFLEX MICROSCOPIC [facility] TR[facility] I  TROPONIN I    Imaging Review Dg Chest 2 View  11/13/2015  CLINICAL DATA:  Confusion with agitation for 2-3 days worse today, vomiting EXAM: CHEST  2 VIEW COMPARISON:  None. FINDINGS:  The heart size and vascular pattern are normal. No consolidation or effusion. Mild diffuse interstitial prominence. At the thoracolumbar junction likely involving L1 there is severe compression deformity. Mild compression deformity at the level above this. Age of these is uncertain. IMPRESSION: No acute cardiopulmonary process. Spinal compression deformities of unknown chronicity. Electronically Signed   By: Skipper Cliche M.D.   On: 11/13/2015 18:22   Ct Head Wo Contrast  11/13/2015  CLINICAL DATA:  74 year old female history of confusion and agitation worsening over the past several days. Alzheimer's disease. EXAM: CT HEAD WITHOUT CONTRAST TECHNIQUE: Contiguous axial images were obtained from the base of the skull through the vertex without intravenous contrast. COMPARISON:  Head CT 08/13/2013. FINDINGS: Patchy and confluent areas of decreased attenuation are noted throughout the deep and periventricular white matter of the cerebral hemispheres bilaterally, compatible with chronic microvascular ischemic disease. Well-defined focus of low attenuation in the central pons (image 12 of series 2), new compared to the prior study, but most  likely to represent an old lacunar infarct. Moderate cerebral atrophy No acute intracranial abnormalities. Specifically, no evidence of acute intracranial hemorrhage, no definite findings of acute/subacute cerebral ischemia, no mass, mass effect, hydrocephalus or abnormal intra or extra-axial fluid collections. Visualized paranasal sinuses and mastoids are well pneumatized. No acute displaced skull fractures are identified. Well-defined 2.7 x 1.3 cm fatty attenuation in addition in the right parietal scalp, similar to the prior study, presumably a lipoma. IMPRESSION: 1. No acute intracranial abnormalities. 2. Moderate cerebral atrophy with extensive chronic microvascular ischemic changes in cerebral white matter, and a new (but chronic appearing) lacunar infarct in the central aspect of the pons. Electronically Signed   By: Vinnie Langton M.D.   On: 11/13/2015 18:14   I have personally reviewed and evaluated these images and lab results as part of my medical decision-making.   EKG Interpretation   Date/Time:  Monday November 13 2015 17:17:44 EDT Ventricular Rate:  97 PR Interval:  145 QRS Duration: 139 QT Interval:  391 QTC Calculation: 497 R Axis:   6 Text Interpretation:  Sinus rhythm Left bundle branch block Baseline  wander in lead(s) V3 LBBB appears new Confirmed by Wyvonnia Dusky  MD, [place]  [id number]) on 11/13/2015 5:28:21 PM      MDM   Final diagnoses:  Dementia, without behavioral disturbance   patient from nursing home with increased confusion concern for UTI. She denies complaints. nonfocal neuro exam. Walking with walker. Afebrile.  Labs appear to be at baseline. Urinalysis negative. She is tolerating by mouth and ambulatory. Chest x-rays negative. LBBB on EKG is new.  Patient denies chest pain. Troponin negative x2. CT head is negative there is a small infarct is new since the last study in 2014 but appears to be chronic.  Workup reassuring. Patient calm and cooperative.  Tolerating PO and ambulatory. Appears stable to return to facility.   Ezequiel Essex, MD 11/14/15 0973  Ezequiel Essex, MD 11/14/15 [phone]

## 2015-11-13 NOTE — ED Notes (Signed)
Pt ambulated without shortness of breath or report of pain- She moves quickly with walker and ambulates heel to toe while carrying on conversation

## 2015-11-13 NOTE — Discharge Instructions (Signed)
Dementia Chest x-ray and urinalysis are negative. CT scan shows an old appearing small stroke. Follow up with Dr. Jeanice Lim. Return precautions discussed. Dementia is a general term for problems with brain function. A person with dementia has memory loss and a hard time with at least one other brain function such as thinking, speaking, or problem solving. Dementia can affect social functioning, how you do your job, your mood, or your personality. The changes may be hidden for a long time. The earliest forms of this disease are usually not detected by family or friends. Dementia can be:  Irreversible.  Potentially reversible.  Partially reversible.  Progressive. This means it can get worse over time. CAUSES  Irreversible dementia causes may include:  Degeneration of brain cells (Alzheimer disease or Lewy body dementia).  Multiple small strokes (vascular dementia).  Infection (chronic meningitis or Creutzfeldt-Jakob disease).  Frontotemporal dementia. This affects younger people, age 35 to 68, compared to those who have Alzheimer disease.  Dementia associated with other disorders like Parkinson disease, Huntington disease, or HIV-associated dementia. Potentially or partially reversible dementia causes may include:  Medicines.  Metabolic causes such as excessive alcohol intake, vitamin B12 deficiency, or thyroid disease.  Masses or pressure in the brain such as a tumor, blood clot, or hydrocephalus. SIGNS AND SYMPTOMS  Symptoms are often hard to detect. Family members or coworkers may not notice them early in the disease process. Different people with dementia may have different symptoms. Symptoms can include:  A hard time with memory, especially recent memory. Long-term memory may not be impaired.  Asking the same question multiple times or forgetting something someone just said.  A hard time speaking your thoughts or finding certain words.  A hard time solving problems or  performing familiar tasks (such as how to use a telephone).  Sudden changes in mood.  Changes in personality, especially increasing moodiness or mistrust.  Depression.  A hard time understanding complex ideas that were never a problem in the past. DIAGNOSIS  There are no specific tests for dementia.   Your health care provider may recommend a thorough evaluation. This is because some forms of dementia can be reversible. The evaluation will likely include a physical exam and getting a detailed history from you and a family member. The history often gives the best clues and suggestions for a diagnosis.  Memory testing may be done. A detailed brain function evaluation called neuropsychologic testing may be helpful.  Lab tests and brain imaging (such as a CT scan or MRI scan) are sometimes important.  Sometimes observation and re-evaluation over time is very helpful. TREATMENT  Treatment depends on the cause.   If the problem is a vitamin deficiency, it may be helped or cured with supplements.  For dementias such as Alzheimer disease, medicines are available to stabilize or slow the course of the disease. There are no cures for this type of dementia.  Your health care provider can help direct you to groups, organizations, and other health care providers to help with decisions in the care of you or your loved one. HOME CARE INSTRUCTIONS The care of individuals with dementia is varied and dependent upon the progression of the dementia. The following suggestions are intended for the person living with, or caring for, the person with dementia.  Create a safe environment.  Remove the locks on bathroom doors to prevent the person from accidentally locking himself or herself in.  Use childproof latches on kitchen cabinets and any place where cleaning supplies,  chemicals, or alcohol are kept.  Use childproof covers in unused electrical outlets.  Install childproof devices to keep doors and  windows secured.  Remove stove knobs or install safety knobs and an automatic shut-off on the stove.  Lower the temperature on water heaters.  Label medicines and keep them locked up.  Secure knives, lighters, matches, power tools, and guns, and keep these items out of reach.  Keep the house free from clutter. Remove rugs or anything that might contribute to a fall.  Remove objects that might break and hurt the person.  Make sure lighting is good, both inside and outside.  Install grab rails as needed.  Use a monitoring device to alert you to falls or other needs for help.  Reduce confusion.  Keep familiar objects and people around.  Use night lights or dim lights at night.  Label items or areas.  Use reminders, notes, or directions for daily activities or tasks.  Keep a simple, consistent routine for waking, meals, bathing, dressing, and bedtime.  Create a calm, quiet environment.  Place large clocks and calendars prominently.  Display emergency numbers and home address near all telephones.  Use cues to establish different times of the day. An example is to open curtains to let the natural light in during the day.   Use effective communication.  Choose simple words and short sentences.  Use a gentle, calm tone of voice.  Be careful not to interrupt.  If the person is struggling to find a word or communicate a thought, try to provide the word or thought.  Ask one question at a time. Allow the person ample time to answer questions. Repeat the question again if the person does not respond.  Reduce nighttime restlessness.  Provide a comfortable bed.  Have a consistent nighttime routine.  Ensure a regular walking or physical activity schedule. Involve the person in daily activities as much as possible.  Limit napping during the day.  Limit caffeine.  Attend social events that stimulate rather than overwhelm the senses.  Encourage good nutrition and  hydration.  Reduce distractions during meal times and snacks.  Avoid foods that are too hot or too cold.  Monitor chewing and swallowing ability.  Continue with routine vision, hearing, dental, and medical screenings.  Give medicines only as directed by the health care provider.  Monitor driving abilities. Do not allow the person to drive when safe driving is no longer possible.  Register with an identification program which could provide location assistance in the event of a missing person situation. SEEK MEDICAL CARE IF:   New behavioral problems start such as moodiness, aggressiveness, or seeing things that are not there (hallucinations).  Any new problem with brain function happens. This includes problems with balance, speech, or falling a lot.  Problems with swallowing develop.  Any symptoms of other illness happen. Small changes or worsening in any aspect of brain function can be a sign that the illness is getting worse. It can also be a sign of another medical illness such as infection. Seeing a health care provider right away is important. SEEK IMMEDIATE MEDICAL CARE IF:   A fever develops.  New or worsened confusion develops.  New or worsened sleepiness develops.  Staying awake becomes hard to do.   This information is not intended to replace advice given to you by your health care provider. Make sure you discuss any questions you have with your health care provider.   Document Released: 02/05/2001 Document Revised:  09/02/2014 Document Reviewed: 01/07/2011 Elsevier Interactive Patient Education [employer]

## 2015-11-22 ENCOUNTER — Encounter: Payer: Self-pay | Admitting: Family Medicine

## 2015-11-22 ENCOUNTER — Ambulatory Visit (INDEPENDENT_AMBULATORY_CARE_PROVIDER_SITE_OTHER): Payer: Medicare Other | Admitting: Family Medicine

## 2015-11-22 VITALS — BP 140/76 | HR 72 | Temp 97.9°F | Resp 14 | Ht 63.0 in | Wt 115.0 lb

## 2015-11-22 DIAGNOSIS — Z794 Long term (current) use of insulin: Secondary | ICD-10-CM | POA: Diagnosis not present

## 2015-11-22 DIAGNOSIS — E46 Unspecified protein-calorie malnutrition: Secondary | ICD-10-CM

## 2015-11-22 DIAGNOSIS — R2990 Unspecified symptoms and signs involving the nervous system: Secondary | ICD-10-CM | POA: Insufficient documentation

## 2015-11-22 DIAGNOSIS — E119 Type 2 diabetes mellitus without complications: Secondary | ICD-10-CM | POA: Diagnosis not present

## 2015-11-22 DIAGNOSIS — I639 Cerebral infarction, unspecified: Secondary | ICD-10-CM

## 2015-11-22 NOTE — Assessment & Plan Note (Signed)
Weight continues to fluctuate. She is still on her protein shake she sold the Remeron and we'll recheck her weight in 8 weeks will see we can incorporate more protein with adding meat and will have them finely chop

## 2015-11-22 NOTE — Assessment & Plan Note (Signed)
Lacunar infarct noted however this is not recent. She is on Plavix and aspirin. There are no neurological changes I would not change her medication.

## 2015-11-22 NOTE — Assessment & Plan Note (Signed)
Her diabetes is controlled this is not determined to any changes. Is truly unclear if she had altered mental status. Her workup at the emergency room was negative. I will have him decrease checking her CBGs to just once a day since she is only on oral medications. I did note some weight loss she is down 5 pounds since her last visit 2 months ago. The nurse aide with her today states that she has not like to eat her meat. We will have them chopped meat finally she is already on protein shakes and she is on Remeron

## 2015-11-22 NOTE — Patient Instructions (Signed)
Continue current medications Decrease glucose checks to once a day in the morning Please chop meats  F/U as previous

## 2015-11-22 NOTE — Progress Notes (Signed)
Patient ID: Kristin Grant, female   DOB: 06-Feb-1941, 75 y.o.   MRN: [id number]      Subjective:    Patient ID: Kristin DineElsene Wiatrek, female    DOB: 06-Feb-1941, 75 y.o.   MRN: [id number]  Patient presents for ER F/U Here for ER follow-up. I received a phone call stating that she has not altered mental status and was agitated she was sent to the emergency room for evaluation she had a CT of head done which showed an old but nor infarct she also had a urinalysis that was normal chest x-ray cardiac workup labs withdrawal negative. When she was evaluated in the ER she was at her baseline no medication changes were performed and she was sent back home. Today she states that she is not sure why she was sent to the emergency room should not feeling well I do not have a nursing notes discussing about the agitation either. Her blood sugars almost insulin have been less than 130 fasting.  There are no new concerns noted today.  Review Of Systems:  GEN- denies fatigue, fever, weight loss,weakness, recent illness HEENT- denies eye drainage, change in vision, nasal discharge, CVS- denies chest pain, palpitations RESP- denies SOB, cough, wheeze ABD- denies N/V, change in stools, abd pain GU- denies dysuria, hematuria, dribbling, incontinence MSK- denies joint pain, muscle aches, injury Neuro- denies headache, dizziness, syncope, seizure activity       Objective:    BP 140/76 mmHg  Pulse 72  Temp(Src) 97.9 F (36.6 C) (Oral)  Resp 14  Ht 5\' 3"  (1.6 m)  Wt 115 lb (52.164 kg)  BMI 20.38 kg/m2 GEN- NAD, alert and oriented x3, weight down 5lbs  HEENT- PERRL, EOMI, non injected sclera, pink conjunctiva, MMM, oropharynx clear CVS- RRR, no murmur RESP-CTAB ABD-NABS,soft,NT,ND Psych- normal affect and mood, Neuro- ambulates with walker, no new deficits, CNII-XII intact EXT- No edema Pulses- Radial  2+        Assessment & Plan:      Problem List Items Addressed This Visit    None      Note: This  dictation was prepared with Dragon dictation along with smaller phrase technology. Any transcriptional errors that result from this process are unintentional.

## 2015-12-11 ENCOUNTER — Emergency Department (HOSPITAL_COMMUNITY)
Admission: EM | Admit: 2015-12-11 | Discharge: 2015-12-11 | Disposition: A | Discharge status: Home / Self Care | Payer: Medicare Other | Attending: Emergency Medicine | Admitting: Emergency Medicine

## 2015-12-11 ENCOUNTER — Emergency Department (HOSPITAL_COMMUNITY): Payer: Medicare Other

## 2015-12-11 ENCOUNTER — Encounter (HOSPITAL_COMMUNITY): Payer: Self-pay | Admitting: *Deleted

## 2015-12-11 DIAGNOSIS — I639 Cerebral infarction, unspecified: Secondary | ICD-10-CM | POA: Insufficient documentation

## 2015-12-11 DIAGNOSIS — Y9389 Activity, other specified: Secondary | ICD-10-CM | POA: Insufficient documentation

## 2015-12-11 DIAGNOSIS — W0110XA Fall on same level from slipping, tripping and stumbling with subsequent striking against unspecified object, initial encounter: Secondary | ICD-10-CM | POA: Diagnosis not present

## 2015-12-11 DIAGNOSIS — Z7984 Long term (current) use of oral hypoglycemic drugs: Secondary | ICD-10-CM | POA: Insufficient documentation

## 2015-12-11 DIAGNOSIS — I2510 Atherosclerotic heart disease of native coronary artery without angina pectoris: Secondary | ICD-10-CM | POA: Diagnosis not present

## 2015-12-11 DIAGNOSIS — S0093XA Contusion of unspecified part of head, initial encounter: Secondary | ICD-10-CM | POA: Insufficient documentation

## 2015-12-11 DIAGNOSIS — E119 Type 2 diabetes mellitus without complications: Secondary | ICD-10-CM | POA: Insufficient documentation

## 2015-12-11 DIAGNOSIS — Y92121 Bathroom in nursing home as the place of occurrence of the external cause: Secondary | ICD-10-CM | POA: Diagnosis not present

## 2015-12-11 DIAGNOSIS — Z79899 Other long term (current) drug therapy: Secondary | ICD-10-CM | POA: Insufficient documentation

## 2015-12-11 DIAGNOSIS — S098XXA Other specified injuries of head, initial encounter: Secondary | ICD-10-CM | POA: Diagnosis not present

## 2015-12-11 DIAGNOSIS — W19XXXA Unspecified fall, initial encounter: Secondary | ICD-10-CM

## 2015-12-11 DIAGNOSIS — Y999 Unspecified external cause status: Secondary | ICD-10-CM | POA: Diagnosis not present

## 2015-12-11 DIAGNOSIS — S0990XA Unspecified injury of head, initial encounter: Secondary | ICD-10-CM | POA: Diagnosis not present

## 2015-12-11 DIAGNOSIS — G309 Alzheimer's disease, unspecified: Secondary | ICD-10-CM | POA: Insufficient documentation

## 2015-12-11 DIAGNOSIS — Z7982 Long term (current) use of aspirin: Secondary | ICD-10-CM | POA: Diagnosis not present

## 2015-12-11 DIAGNOSIS — S0083XA Contusion of other part of head, initial encounter: Secondary | ICD-10-CM | POA: Diagnosis not present

## 2015-12-11 DIAGNOSIS — R000 Tachycardia, unspecified: Secondary | ICD-10-CM | POA: Diagnosis not present

## 2015-12-11 DIAGNOSIS — Y92129 Unspecified place in nursing home as the place of occurrence of the external cause: Secondary | ICD-10-CM

## 2015-12-11 NOTE — ED Notes (Signed)
Pt brought in by [facility] for c/o fall; pt was found in bathroom by staff; pt has large bump to back of right side of head; pt states she did not get dizzy when she fell, she just got tangled up in her feet

## 2015-12-11 NOTE — ED Provider Notes (Addendum)
CSN: [id number]     Arrival date & time 12/11/15  0106 History   First MD Initiated Contact with Patient 12/11/15 0140   Chief Complaint  Patient presents with  . Fall     (Consider location/radiation/quality/duration/timing/severity/associated sxs/prior Treatment) HPI patient states she feels fine. She states tonight she went into the bathroom to change clothes and she states the floor slippery and she slipped and fell hitting the back of her head. She denies any loss of consciousness. She denies any pain at all. However patient is on Plavix.  PCP Dr Buelah Manis  Past Medical History  Diagnosis Date  . Diabetes mellitus   . Hypertension   . Reflux   . Coronary artery disease   . Alzheimer disease   . Arthritis   . Stroke ([place])   . Difficulty walking    History reviewed. No pertinent past surgical history. Family History  Problem Relation Age of Onset  . Hypertension Mother   . Heart disease Mother   . Hypertension Father   . Heart disease Father   . Hypertension Sister   . Heart disease Sister   . Cancer Sister     BREAST   . Hypertension Brother   . Heart disease Brother    Social History  Substance Use Topics  . Smoking status: Never Smoker   . Smokeless tobacco: Never Used  . Alcohol Use: No   Lives in NH  OB History    No data available     Review of Systems  All other systems reviewed and are negative.     Allergies  Review of patient's allergies indicates no known allergies.  Home Medications   Prior to Admission medications   Medication Sig Start Date End Date Taking? Authorizing Provider  aspirin 81 MG tablet Take 81 mg by mouth daily.    Historical Provider, MD  Blood Glucose Monitoring Suppl (BLOOD GLUCOSE MONITOR SYSTEM) W/DEVICE KIT 1 Units by Does not apply route daily. 07/19/13   [name], MD  clopidogrel (PLAVIX) 75 MG tablet TAKE 1 TABLET BY MOUTH ONCE DAILY. 08/09/13   [name], MD  dexlansoprazole (DEXILANT) 60 MG  capsule Take 1 capsule (60 mg total) by mouth daily. 09/15/15   Susy Frizzle, MD  JANUVIA 100 MG tablet TAKE 1 TABLET BY MOUTH ONCE DAILY. 11/07/14   [name], MD  levothyroxine (SYNTHROID, LEVOTHROID) 50 MCG tablet TAKE 1 TABLET BY MOUTH ONCE DAILY. 08/09/13   [name], MD  lisinopril (PRINIVIL,ZESTRIL) 40 MG tablet TAKE 1 TABLET BY MOUTH ONCE DAILY. 08/09/13   [name], MD  metFORMIN (GLUCOPHAGE) 1000 MG tablet Take 1 tablet (1,000 mg total) by mouth 2 (two) times daily with a meal. 10/05/14   [name], MD  mirtazapine (REMERON) 7.5 MG tablet Take 1 tablet (7.5 mg total) by mouth at bedtime. For appetite 06/14/15   [name], MD  Multiple Vitamin (DAILY VITE) TABS TAKE 1 TABLET BY MOUTH ONCE DAILY. 08/09/13   [name], MD  potassium chloride (MICRO-K) 10 MEQ CR capsule Take 10 mEq by mouth 2 (two) times daily.    Historical Provider, MD  propranolol (INDERAL) 40 MG tablet TAKE 1 TABLET BY MOUTH TWICE DAILY. 08/09/13   [name], MD  Q-PAP 500 MG tablet TAKE 1 TABLET BY MOUTH EVERY 6 HOURS AS NEEDED FOR PAIN OR FEVER. 10/04/13   [name], MD  QC PINK BISMUTH 262 MG/15ML suspension TAKE 1  TABLESPOONFUL (34m) BY MOUTH EVERY 6 HOURS AS NEEDED FOR STOMACH UPSET. 03/24/15   KAlycia Rossetti MD  traMADol (ULTRAM) 50 MG tablet TAKE 1 TABLET BY MOUTH EVERY 6 HOURS AS NEEDED FOR PAIN. 12/22/13   KAlycia Rossetti MD  TUSSIN DM 100-10 MG/5ML liquid TAKE 1 TEASPOONFUL (539m BY MOUTH EVER 4 HOURS AS NEEDED FOR COUGH. 08/30/14   KaAlycia RossettiMD   BP 116/73 mmHg  Pulse 109  Temp(Src) 99 F (37.2 C) (Oral)  Resp 20  Ht _0  (1.6 m)  Wt 120 lb (54.432 kg)  BMI 21.26 kg/m2  SpO2 98%  Vital signs normal except for tachycardia  Physical Exam  Constitutional: She is oriented to person, place, and time.  Non-toxic appearance. She does not appear ill. No distress.  Frail elderly female  HENT:  Head: Normocephalic and atraumatic.  Right Ear:  External ear normal.  Left Ear: External ear normal.  Nose: Nose normal. No mucosal edema or rhinorrhea.  Mouth/Throat: Oropharynx is clear and moist and mucous membranes are normal. No dental abscesses or uvula swelling.  Large soft cyst on right posterior scalp that she states has been there "since I was born"  Eyes: Conjunctivae and EOM are normal. Pupils are equal, round, and reactive to light.  Neck: Normal range of motion and full passive range of motion without pain. Neck supple.  Cardiovascular: Normal rate, regular rhythm and normal heart sounds.  Exam reveals no gallop and no friction rub.   No murmur heard. Pulmonary/Chest: Effort normal and breath sounds normal. No respiratory distress. She has no wheezes. She has no rhonchi. She has no rales. She exhibits no tenderness and no crepitus.  Abdominal: Soft. Normal appearance and bowel sounds are normal. She exhibits no distension. There is no tenderness. There is no rebound and no guarding.  Musculoskeletal: Normal range of motion. She exhibits no edema or tenderness.  Moves all extremities well.   Neurological: She is alert and oriented to person, place, and time. She has normal strength. No cranial nerve deficit.  Skin: Skin is warm, dry and intact. No rash noted. No erythema. No pallor.  Psychiatric: She has a normal mood and affect. Her speech is normal and behavior is normal. Her mood appears not anxious.  Nursing note and vitals reviewed.   ED Course  Procedures (including critical care time)  Since patient fell and hit the back of her head and she is on Plavix CT scan of the head was done. She however is neurologically intact.  Patient is alert and cooperative, further laboratory testing was not done.   Imaging Review Ct Head Wo Contrast  12/11/2015  CLINICAL DATA:  Initial evaluation for acute trauma, fall. On Plavix. EXAM: CT HEAD WITHOUT CONTRAST TECHNIQUE: Contiguous axial images were obtained from the base of the  skull through the vertex without intravenous contrast. COMPARISON:  Prior study from 11/13/2015. FINDINGS: Approximately 2.6 cm cyst present within the right parietal scalp, stable. Scalp soft tissues otherwise unremarkable without acute abnormality. No acute abnormality about the globes and orbits. No extra-axial fluid collection. No acute intracranial hemorrhage. No acute large vessel territory infarct. No mass lesion, midline shift, or mass effect. Ventricular prominence related to global parenchymal volume loss/hydrocephalus. Age-related cerebral atrophy with chronic small vessel ischemic disease noted, stable. Prominent intracranial atherosclerosis noted. Small remote lacunar infarct within central pons is noted. Mucosal thickening within the inferior right maxillary sinus. Paranasal sinuses are otherwise clear. No mastoid effusion. Calvarium intact. IMPRESSION: 1.  No acute intracranial process. 2. Stable atrophy with chronic small vessel ischemic disease with prominent intracranial atherosclerosis. Electronically Signed   By: Jeannine Boga M.D.   On: 12/11/2015 02:55   I have personally reviewed and evaluated these images and lab results as part of my medical decision-making.    MDM   Final diagnoses:  Fall at nursing home, initial encounter  Contusion of head, initial encounter    Plan discharge  Rolland Porter, MD, Barbette Or, MD 12/11/15 [place], MD 12/11/15 [phone]

## 2015-12-11 NOTE — ED Notes (Signed)
Pt assisted to the restroom by this nurse.

## 2015-12-11 NOTE — Discharge Instructions (Signed)
Recheck for any problems on the head injury sheet or if you get areas that are painful.  Cryotherapy Cryotherapy is when you put ice on your injury. Ice helps lessen pain and puffiness (swelling) after an injury. Ice works the best when you start using it in the first 24 to 48 hours after an injury. HOME CARE  Put a dry or damp towel between the ice pack and your skin.  You may press gently on the ice pack.  Leave the ice on for no more than 10 to 20 minutes at a time.  Check your skin after 5 minutes to make sure your skin is okay.  Rest at least 20 minutes between ice pack uses.  Stop using ice when your skin loses feeling (numbness).  Do not use ice on someone who cannot tell you when it hurts. This includes small children and people with memory problems (dementia). GET HELP RIGHT AWAY IF:  You have white spots on your skin.  Your skin turns blue or pale.  Your skin feels waxy or hard.  Your puffiness gets worse. MAKE SURE YOU:   Understand these instructions.  Will watch your condition.  Will get help right away if you are not doing well or get worse.   This information is not intended to replace advice given to you by your health care provider. Make sure you discuss any questions you have with your health care provider.   Document Released: 01/29/2008 Document Revised: 11/04/2011 Document Reviewed: 04/04/2011 Elsevier Interactive Patient Education 2016 Elsevier Inc.  Head Injury, Adult You have a head injury. Headaches and throwing up (vomiting) are common after a head injury. It should be easy to wake up from sleeping. Sometimes you must stay in the hospital. Most problems happen within the first 24 hours. Side effects may occur up to 7-10 days after the injury.  WHAT ARE THE TYPES OF HEAD INJURIES? Head injuries can be as minor as a bump. Some head injuries can be more severe. More severe head injuries include:  A jarring injury to the brain (concussion).  A  bruise of the brain (contusion). This mean there is bleeding in the brain that can cause swelling.  A cracked skull (skull fracture).  Bleeding in the brain that collects, clots, and forms a bump (hematoma). WHEN SHOULD I GET HELP RIGHT AWAY?   You are confused or sleepy.  You cannot be woken up.  You feel sick to your stomach (nauseous) or keep throwing up (vomiting).  Your dizziness or unsteadiness is getting worse.  You have very bad, lasting headaches that are not helped by medicine. Take medicines only as told by your doctor.  You cannot use your arms or legs like normal.  You cannot walk.  You notice changes in the black spots in the center of the colored part of your eye (pupil).  You have clear or bloody fluid coming from your nose or ears.  You have trouble seeing. During the next 24 hours after the injury, you must stay with someone who can watch you. This person should get help right away (call 911 in the [place] if you start to shake and are not able to control it (have seizures), you pass out, or you are unable to wake up. HOW CAN I PREVENT A HEAD INJURY IN THE FUTURE?  Wear seat belts.  Wear a helmet while bike riding and playing sports like football.  Stay away from dangerous activities around the house. WHEN CAN I  RETURN TO NORMAL ACTIVITIES AND ATHLETICS? See your doctor before doing these activities. You should not do normal activities or play contact sports until 1 week after the following symptoms have stopped:  Headache that does not go away.  Dizziness.  Poor attention.  Confusion.  Memory problems.  Sickness to your stomach or throwing up.  Tiredness.  Fussiness.  Bothered by bright lights or loud noises.  Anxiousness or depression.  Restless sleep. MAKE SURE YOU:   Understand these instructions.  Will watch your condition.  Will get help right away if you are not doing well or get worse.   This information is not intended to  replace advice given to you by your health care provider. Make sure you discuss any questions you have with your health care provider.   Document Released: 07/25/2008 Document Revised: 09/02/2014 Document Reviewed: 04/19/2013 Elsevier Interactive Patient Education [employer]2016 Elsevier Inc.

## 2015-12-21 DIAGNOSIS — Z7984 Long term (current) use of oral hypoglycemic drugs: Secondary | ICD-10-CM | POA: Diagnosis not present

## 2015-12-21 DIAGNOSIS — E119 Type 2 diabetes mellitus without complications: Secondary | ICD-10-CM | POA: Diagnosis not present

## 2015-12-27 DIAGNOSIS — B351 Tinea unguium: Secondary | ICD-10-CM | POA: Diagnosis not present

## 2015-12-27 DIAGNOSIS — M79675 Pain in left toe(s): Secondary | ICD-10-CM | POA: Diagnosis not present

## 2015-12-27 DIAGNOSIS — M79674 Pain in right toe(s): Secondary | ICD-10-CM | POA: Diagnosis not present

## 2015-12-29 ENCOUNTER — Emergency Department (HOSPITAL_COMMUNITY)
Admission: EM | Admit: 2015-12-29 | Discharge: 2015-12-29 | Disposition: A | Discharge status: Home / Self Care | Payer: Medicare Other | Attending: Emergency Medicine | Admitting: Emergency Medicine

## 2015-12-29 ENCOUNTER — Encounter (HOSPITAL_COMMUNITY): Payer: Self-pay | Admitting: Emergency Medicine

## 2015-12-29 DIAGNOSIS — I10 Essential (primary) hypertension: Secondary | ICD-10-CM | POA: Insufficient documentation

## 2015-12-29 DIAGNOSIS — Z7984 Long term (current) use of oral hypoglycemic drugs: Secondary | ICD-10-CM | POA: Insufficient documentation

## 2015-12-29 DIAGNOSIS — E119 Type 2 diabetes mellitus without complications: Secondary | ICD-10-CM | POA: Insufficient documentation

## 2015-12-29 DIAGNOSIS — G309 Alzheimer's disease, unspecified: Secondary | ICD-10-CM | POA: Diagnosis not present

## 2015-12-29 DIAGNOSIS — I2510 Atherosclerotic heart disease of native coronary artery without angina pectoris: Secondary | ICD-10-CM | POA: Insufficient documentation

## 2015-12-29 DIAGNOSIS — R197 Diarrhea, unspecified: Secondary | ICD-10-CM

## 2015-12-29 LAB — COMPREHENSIVE METABOLIC PANEL
ALK PHOS: 75 U/L (ref 38–126)
ALT: 16 U/L (ref 14–54)
AST: 13 U/L — ABNORMAL LOW (ref 15–41)
Albumin: 3.8 g/dL (ref 3.5–5.0)
Anion gap: 8 (ref 5–15)
BILIRUBIN TOTAL: 0.2 mg/dL — AB (ref 0.3–1.2)
BUN: 25 mg/dL — ABNORMAL HIGH (ref 6–20)
CALCIUM: 8.8 mg/dL — AB (ref 8.9–10.3)
CO2: 24 mmol/L (ref 22–32)
CREATININE: 1.03 mg/dL — AB (ref 0.44–1.00)
Chloride: 108 mmol/L (ref 101–111)
GFR, EST AFRICAN AMERICAN: 60 mL/min — AB (ref >60)
GFR, EST NON AFRICAN AMERICAN: 51 mL/min — AB (ref >60)
Glucose, Bld: 133 mg/dL — ABNORMAL HIGH (ref 65–99)
Potassium: 4.1 mmol/L (ref 3.5–5.1)
Sodium: 140 mmol/L (ref 135–145)
Total Protein: 6.7 g/dL (ref 6.5–8.1)

## 2015-12-29 LAB — CBC WITH DIFFERENTIAL/PLATELET
BASOS ABS: 0 10*3/uL (ref 0.0–0.1)
BASOS PCT: 0 %
EOS ABS: 0.3 10*3/uL (ref 0.0–0.7)
Eosinophils Relative: 4 %
HEMATOCRIT: 39.3 % (ref 36.0–46.0)
HEMOGLOBIN: 13 g/dL (ref 12.0–15.0)
Lymphocytes Relative: 24 %
Lymphs Abs: 1.7 10*3/uL (ref 0.7–4.0)
MCH: 28.4 pg (ref 26.0–34.0)
MCHC: 33.1 g/dL (ref 30.0–36.0)
MCV: 85.8 fL (ref 78.0–100.0)
MONOS PCT: 8 %
Monocytes Absolute: 0.6 10*3/uL (ref 0.1–1.0)
NEUTROS PCT: 64 %
Neutro Abs: 4.4 10*3/uL (ref 1.7–7.7)
Platelets: 190 10*3/uL (ref 150–400)
RBC: 4.58 MIL/uL (ref 3.87–5.11)
RDW: 14.3 % (ref 11.5–15.5)
WBC: 7 10*3/uL (ref 4.0–10.5)

## 2015-12-29 LAB — LIPASE, BLOOD: Lipase: 14 U/L (ref 11–51)

## 2015-12-29 NOTE — ED Notes (Signed)
[facility] and they will send transport to pick up pt at this time.

## 2015-12-29 NOTE — ED Notes (Signed)
Pt denies abd pain but reports diarrhea for several days. Pt reports loss of appetite and is able to eat but reports "it runs right through me." nad noted. Pt denies n/v/d.

## 2015-12-29 NOTE — ED Provider Notes (Signed)
CSN: [id number]     Arrival date & time 12/29/15  1306 History   First MD Initiated Contact with Patient 12/29/15 1333     Chief Complaint  Patient presents with  . Diarrhea    Patient is a 75 y.o. female presenting with diarrhea. The history is provided by the patient.  Diarrhea Quality:  Watery Severity:  Moderate Onset quality:  Gradual Duration:  3 weeks Timing:  Intermittent Progression:  Improving Relieved by:  Nothing Worsened by:  Nothing tried Associated symptoms: abdominal pain   Associated symptoms: no fever and no vomiting   Risk factors: no recent antibiotic use   Risk factors comment:  Lives in nursing facility pt reports diarrhea for past 3 weeks but reports it is improving No bloody or dark stools No fever She has had some abdominal pain but none at this time No vomiting is reported No other complaints   Past Medical History  Diagnosis Date  . Diabetes mellitus   . Hypertension   . Reflux   . Coronary artery disease   . Alzheimer disease   . Arthritis   . Stroke ([place])   . Difficulty walking    History reviewed. No pertinent past surgical history. Family History  Problem Relation Age of Onset  . Hypertension Mother   . Heart disease Mother   . Hypertension Father   . Heart disease Father   . Hypertension Sister   . Heart disease Sister   . Cancer Sister     BREAST   . Hypertension Brother   . Heart disease Brother    Social History  Substance Use Topics  . Smoking status: Never Smoker   . Smokeless tobacco: Never Used  . Alcohol Use: No   OB History    No data available     Review of Systems  Constitutional: Positive for fatigue. Negative for fever.  Cardiovascular: Negative for chest pain.  Gastrointestinal: Positive for abdominal pain and diarrhea. Negative for vomiting and blood in stool.  All other systems reviewed and are negative.     Allergies  Review of patient's allergies indicates no known allergies.  Home Medications    Prior to Admission medications   Medication Sig Start Date End Date Taking? Authorizing Provider  aspirin 81 MG tablet Take 81 mg by mouth daily.    Historical Provider, MD  Blood Glucose Monitoring Suppl (BLOOD GLUCOSE MONITOR SYSTEM) W/DEVICE KIT 1 Units by Does not apply route daily. 07/19/13   [name], MD  clopidogrel (PLAVIX) 75 MG tablet TAKE 1 TABLET BY MOUTH ONCE DAILY. 08/09/13   [name], MD  dexlansoprazole (DEXILANT) 60 MG capsule Take 1 capsule (60 mg total) by mouth daily. 09/15/15   Susy Frizzle, MD  JANUVIA 100 MG tablet TAKE 1 TABLET BY MOUTH ONCE DAILY. 11/07/14   [name], MD  levothyroxine (SYNTHROID, LEVOTHROID) 50 MCG tablet TAKE 1 TABLET BY MOUTH ONCE DAILY. 08/09/13   [name], MD  lisinopril (PRINIVIL,ZESTRIL) 40 MG tablet TAKE 1 TABLET BY MOUTH ONCE DAILY. 08/09/13   [name], MD  metFORMIN (GLUCOPHAGE) 1000 MG tablet Take 1 tablet (1,000 mg total) by mouth 2 (two) times daily with a meal. 10/05/14   [name], MD  mirtazapine (REMERON) 7.5 MG tablet Take 1 tablet (7.5 mg total) by mouth at bedtime. For appetite 06/14/15   [name], MD  Multiple Vitamin (DAILY VITE) TABS TAKE 1 TABLET BY MOUTH ONCE DAILY. 08/09/13  [name], MD  potassium chloride (MICRO-K) 10 MEQ CR capsule Take 10 mEq by mouth 2 (two) times daily.    Historical Provider, MD  propranolol (INDERAL) 40 MG tablet TAKE 1 TABLET BY MOUTH TWICE DAILY. 08/09/13   [name], MD  Q-PAP 500 MG tablet TAKE 1 TABLET BY MOUTH EVERY 6 HOURS AS NEEDED FOR PAIN OR FEVER. 10/04/13   [name], MD  QC PINK BISMUTH 262 MG/15ML suspension TAKE 1 TABLESPOONFUL (56m) BY MOUTH EVERY 6 HOURS AS NEEDED FOR STOMACH UPSET. 03/24/15   KAlycia Rossetti MD  traMADol (ULTRAM) 50 MG tablet TAKE 1 TABLET BY MOUTH EVERY 6 HOURS AS NEEDED FOR PAIN. 12/22/13   KAlycia Rossetti MD  TUSSIN DM 100-10 MG/5ML liquid TAKE 1 TEASPOONFUL (546m BY MOUTH EVER 4 HOURS AS  NEEDED FOR COUGH. 08/30/14   KaAlycia RossettiMD   BP 110/52 mmHg  Pulse 82  Temp(Src) 98 F (36.7 C) (Oral)  Resp 18  Ht '5\' 5"'  (1.651 m)  Wt 54.432 kg  BMI 19.97 kg/m2  SpO2 97% Physical Exam CONSTITUTIONAL: elderly, frail HEAD: Normocephalic/atraumatic EYES: EOMI/PERRL, no icterus ENMT: Mucous membranes moist NECK: supple no meningeal signs CV: S1/S2 noted, no murmurs/rubs/gallops noted LUNGS: Lungs are clear to auscultation bilaterally, no apparent distress ABDOMEN: soft, nontender NEURO: Pt is awake/alert/appropriate, moves all extremitiesx4.  No facial droop.   EXTREMITIES: pulses normal/equal, full ROM SKIN: warm, color normal PSYCH: no abnormalities of mood noted, alert and oriented to situation  ED Course  Procedures  2:29 PM Pt stable/well appearing No distress  will attempt to capture stool sample 2:47 PM [facility] "few days and we can't get a hold of it" No fever/vomiting She has had decreased appetite She has h/o dementia/confusion (pt told me diarrhea for weeks) Will check labs then d/c back to facility 3:29 PM Labs reassuring Pt without complaints Stable for d/c back to facility  Labs Review Labs Reviewed  COMPREHENSIVE METABOLIC PANEL - Abnormal; Notable for the following:    Glucose, Bld 133 (*)    BUN 25 (*)    Creatinine, Ser 1.03 (*)    Calcium 8.8 (*)    AST 13 (*)    Total Bilirubin 0.2 (*)    GFR calc non Af Amer 51 (*)    GFR calc Af Amer 60 (*)    All other components within normal limits  GASTROINTESTINAL PANEL BY PCR, STOOL (REPLACES STOOL CULTURE)  C DIFFICILE QUICK SCREEN W PCR REFLEX  CBC WITH DIFFERENTIAL/PLATELET  LIPASE, BLOOD    I have personally reviewed and evaluated these lab results as part of my medical decision-making.    MDM   Final diagnoses:  Diarrhea, unspecified type    Nursing notes including past medical history and social history reviewed and  considered in documentation Labs/vital reviewed myself and considered during evaluation     DoRipley FraiseMD 12/29/15 1529

## 2016-01-09 ENCOUNTER — Telehealth: Payer: Self-pay | Admitting: *Deleted

## 2016-01-09 DIAGNOSIS — R197 Diarrhea, unspecified: Secondary | ICD-10-CM

## 2016-01-09 MED ORDER — METRONIDAZOLE 500 MG PO TABS: 500.0000 mg | ORAL_TABLET | Freq: Two times a day (BID) | ORAL | Status: DC

## 2016-01-09 NOTE — Telephone Encounter (Signed)
They need to bring in stool sample  Stop the metformin  Start Flagyl 500mg  BID for 7 days - to cover enteritis, possible C diff  Schedule OV for this week

## 2016-01-09 NOTE — Telephone Encounter (Signed)
[facility] [phone]~telephone/ [phone]~fax.   Reports that patient has diarrhea x1 week. Reports that patient has been seen in ER d/t diarrhea. Reports that all labs came back with no explanation for diarrhea.   Reports that they have stopped Carnation thinking it may be a contributing factor, but stools have not changed.   Also reports that patient is taking MTF before bed and is having worse diarrhea at night. Patient does not have orders for imodium, but facility has been giving it to her with no relief.   Patient has F/U on 01/16/2016. MD please advise.

## 2016-01-09 NOTE — Telephone Encounter (Signed)
Call placed to facility and [name] made aware.   Prescription sent to pharmacy.   Orders faxed to facility.   [facility] made aware of urgent appointment.

## 2016-01-10 ENCOUNTER — Encounter: Payer: Self-pay | Admitting: *Deleted

## 2016-01-12 ENCOUNTER — Ambulatory Visit (INDEPENDENT_AMBULATORY_CARE_PROVIDER_SITE_OTHER): Payer: Medicare Other | Admitting: Family Medicine

## 2016-01-12 ENCOUNTER — Encounter: Payer: Self-pay | Admitting: Family Medicine

## 2016-01-12 VITALS — BP 128/70 | HR 70 | Temp 97.8°F | Resp 12 | Ht 65.0 in | Wt 104.0 lb

## 2016-01-12 DIAGNOSIS — R197 Diarrhea, unspecified: Secondary | ICD-10-CM | POA: Diagnosis not present

## 2016-01-12 DIAGNOSIS — E038 Other specified hypothyroidism: Secondary | ICD-10-CM

## 2016-01-12 DIAGNOSIS — I639 Cerebral infarction, unspecified: Secondary | ICD-10-CM

## 2016-01-12 DIAGNOSIS — R634 Abnormal weight loss: Secondary | ICD-10-CM

## 2016-01-12 DIAGNOSIS — E46 Unspecified protein-calorie malnutrition: Secondary | ICD-10-CM

## 2016-01-12 LAB — CBC WITH DIFFERENTIAL/PLATELET
BASOS ABS: 0 {cells}/uL (ref 0–200)
Basophils Relative: 0 %
EOS PCT: 3 %
Eosinophils Absolute: 282 cells/uL (ref 15–500)
HCT: 38.9 % (ref 35.0–45.0)
HEMOGLOBIN: 12.7 g/dL (ref 12.0–15.0)
Lymphocytes Relative: 19 %
Lymphs Abs: 1786 cells/uL (ref 850–3900)
MCH: 27.5 pg (ref 27.0–33.0)
MCHC: 32.6 g/dL (ref 32.0–36.0)
MCV: 84.4 fL (ref 80.0–100.0)
MONOS PCT: 9 %
MPV: 11.4 fL (ref 7.5–12.5)
Monocytes Absolute: 846 cells/uL (ref 200–950)
NEUTROS PCT: 69 %
Neutro Abs: 6486 cells/uL (ref 1500–7800)
PLATELETS: 209 10*3/uL (ref 140–400)
RBC: 4.61 MIL/uL (ref 3.80–5.10)
RDW: 14.9 % (ref 11.0–15.0)
WBC: 9.4 10*3/uL (ref 3.8–10.8)

## 2016-01-12 LAB — COMPREHENSIVE METABOLIC PANEL
ALBUMIN: 3.6 g/dL (ref 3.6–5.1)
ALT: 21 U/L (ref 6–29)
AST: 18 U/L (ref 10–35)
Alkaline Phosphatase: 61 U/L (ref 33–130)
BUN: 12 mg/dL (ref 7–25)
CHLORIDE: 107 mmol/L (ref 98–110)
CO2: 27 mmol/L (ref 20–31)
CREATININE: 0.68 mg/dL (ref 0.60–0.93)
Calcium: 8.7 mg/dL (ref 8.6–10.4)
Glucose, Bld: 135 mg/dL — ABNORMAL HIGH (ref 70–99)
POTASSIUM: 5.5 mmol/L — AB (ref 3.5–5.3)
SODIUM: 141 mmol/L (ref 135–146)
Total Bilirubin: 0.3 mg/dL (ref 0.2–1.2)
Total Protein: 5.6 g/dL — ABNORMAL LOW (ref 6.1–8.1)

## 2016-01-12 LAB — T3, FREE: T3, Free: 2.4 pg/mL (ref 2.3–4.2)

## 2016-01-12 LAB — TSH: TSH: 1.77 mIU/L

## 2016-01-12 LAB — T4, FREE: FREE T4: 1.4 ng/dL (ref 0.8–1.8)

## 2016-01-12 NOTE — Progress Notes (Signed)
Patient ID: Kristin Grant, female   DOB: 12-01-1940, 75 y.o.   MRN: [id number]    Subjective:    Patient ID: Kristin Grant, female    DOB: 12-01-1940, 75 y.o.   MRN: [id number]  Patient presents for ER F/U State she had here for follow-up on diarrhea. The story is very confusing but she went to emergency room on May 5 with diarrhea she was sent back home and told to use Imodium. Her labs are fairly unremarkable. She states that she is having diarrhea but states she only once or twice a day the staff member with her today does not know specifics. We did receive a call from one of the nurses at the facility earlier this week he states that she had been having multiple loose stools they tried stopping her Carnation instant breakfast this did not help the also gave her Imodium this did not help. I recommended that they get stool studies done earlier this week C. difficile and stool culture after taking the sample there were supposed to start Flagyl empirically to cover for C. difficile as she is in a facility. I also recommended that they hold her metformin. Based on her medication filed today they started Flagyl on the 17th however they still not taking a sample to the lab there is supposed to do this today. There is also no specific how much diarrhea she is having if there is blood in the stool. She still has very spotty appetite her weight is down 11 pounds since her last visit in March she denies any pain anywhere she's not had any fever no vomiting otherwise she has been at her baseline. Her blood sugar this morning was 101 off of the metformin    Review Of Systems:  GEN- denies fatigue, fever, +weight loss,weakness, recent illness HEENT- denies eye drainage, change in vision, nasal discharge, CVS- denies chest pain, palpitations RESP- denies SOB, cough, wheeze ABD- denies N/V, +change in stools, abd pain GU- denies dysuria, hematuria, dribbling, incontinence MSK- denies joint pain, muscle aches,  injury Neuro- denies headache, dizziness, syncope, seizure activity       Objective:    BP 128/70 mmHg  Pulse 70  Temp(Src) 97.8 F (36.6 C) (Oral)  Resp 12  Ht 5\' 5"  (1.651 m)  Wt 104 lb (47.174 kg)  BMI 17.31 kg/m2  SpO2 96% GEN- NAD, alert and oriented x3,ambulates with walker  HEENT- PERRL, EOMI, non injected sclera, pink conjunctiva, MMM, oropharynx clear Neck- Supple, no thyromegaly CVS- RRR, no murmur RESP-CTAB ABD-NABS,soft,NT,ND EXT- No edema Pulses- Radial, DP- 2+        Assessment & Plan:      Problem List Items Addressed This Visit    Protein-calorie malnutrition (HCC) - Primary   Relevant Orders   CBC with Differential/Platelet   Comprehensive metabolic panel   Loss of weight    Known malnutrition with difficulty keeping her weight up which is why she was on protein supplements with meals. Check thyroid labs make sure not contributing Hold MTF with the diarrhea, CBG are still good  Increase remeron to 15mg        Hypothyroidism   Relevant Orders   TSH   T4, free   T3, free    Other Visit Diagnoses    Diarrhea, unspecified type        Complete the flagyl, will try to get more information from the facility, on her bowel movements, i want to make sure not infectious before starting Lomotil  Relevant Orders    CBC with Differential/Platelet    Comprehensive metabolic panel       Note: This dictation was prepared with Dragon dictation along with smaller phrase technology. Any transcriptional errors that result from this process are unintentional.

## 2016-01-12 NOTE — Assessment & Plan Note (Addendum)
Known malnutrition with difficulty keeping her weight up which is why she was on protein supplements with meals. Check thyroid labs make sure not contributing Hold MTF with the diarrhea, CBG are still good  Increase remeron to 15mg  F/u 4 weeks

## 2016-01-12 NOTE — Patient Instructions (Addendum)
Continue flagyl Increase remeron to 15mg  at bedtime  F/U 1 month

## 2016-01-15 ENCOUNTER — Encounter: Payer: Self-pay | Admitting: *Deleted

## 2016-01-16 LAB — STOOL CULTURE

## 2016-01-17 ENCOUNTER — Encounter: Payer: Self-pay | Admitting: *Deleted

## 2016-01-17 LAB — CLOSTRIDIUM DIFFICILE CULTURE-FECAL

## 2016-01-19 ENCOUNTER — Ambulatory Visit: Payer: Medicare Other | Admitting: Family Medicine

## 2016-02-12 ENCOUNTER — Ambulatory Visit (INDEPENDENT_AMBULATORY_CARE_PROVIDER_SITE_OTHER): Payer: Medicare Other | Admitting: Family Medicine

## 2016-02-12 ENCOUNTER — Encounter: Payer: Self-pay | Admitting: Family Medicine

## 2016-02-12 VITALS — BP 128/72 | HR 68 | Temp 98.3°F | Resp 14 | Ht 65.0 in | Wt 107.0 lb

## 2016-02-12 DIAGNOSIS — I639 Cerebral infarction, unspecified: Secondary | ICD-10-CM | POA: Diagnosis not present

## 2016-02-12 DIAGNOSIS — E875 Hyperkalemia: Secondary | ICD-10-CM | POA: Diagnosis not present

## 2016-02-12 DIAGNOSIS — E46 Unspecified protein-calorie malnutrition: Secondary | ICD-10-CM | POA: Diagnosis not present

## 2016-02-12 DIAGNOSIS — Z794 Long term (current) use of insulin: Secondary | ICD-10-CM | POA: Diagnosis not present

## 2016-02-12 DIAGNOSIS — E119 Type 2 diabetes mellitus without complications: Secondary | ICD-10-CM | POA: Diagnosis not present

## 2016-02-12 LAB — BASIC METABOLIC PANEL
BUN: 9 mg/dL (ref 7–25)
CALCIUM: 8.5 mg/dL — AB (ref 8.6–10.4)
CO2: 34 mmol/L — ABNORMAL HIGH (ref 20–31)
Chloride: 102 mmol/L (ref 98–110)
Creat: 0.67 mg/dL (ref 0.60–0.93)
GLUCOSE: 99 mg/dL (ref 70–99)
POTASSIUM: 4.5 mmol/L (ref 3.5–5.3)
SODIUM: 144 mmol/L (ref 135–146)

## 2016-02-12 LAB — HEMOGLOBIN A1C
HEMOGLOBIN A1C: 5.9 % — AB (ref <5.7)
MEAN PLASMA GLUCOSE: 123 mg/dL

## 2016-02-12 NOTE — Patient Instructions (Signed)
Continue current medications  We will call with labs  Weight up 3lbs  A1C and potassium to be done  F/U 3 months

## 2016-02-12 NOTE — Assessment & Plan Note (Signed)
Weight improved, continue daily supplement, continue remeron at current dose

## 2016-02-12 NOTE — Progress Notes (Signed)
Patient ID: Kristin Grant, female   DOB: [birth year], 75 y.o.   MRN: [id number]   Subjective:    Patient ID: Kristin DineElsene Vanyo, female    DOB: [birth year], 75 y.o.   MRN: [id number]  Patient presents for 1 month F/U Patient here for one-month follow-up. Her last visit her weight was noted to be decreased she also had some diarrhea episodes. Her diarrhea did resolve she did not have C. difficile colitis. We did treat her empirically with some Flagyl. She states her appetite is better. Her weight is up 3 pounds. She is not drinking that protein supplement she is still convinced that they're causing her to have diarrhea. I also stopped her metformin. Her Remeron was increased to 15 mg at bedtime No new concerns K was slightly elevated on labs Due for A1C now on Januvia, no CBG readings with her today    Review Of Systems:  GEN- denies fatigue, fever, weight loss,weakness, recent illness HEENT- denies eye drainage, change in vision, nasal discharge, CVS- denies chest pain, palpitations RESP- denies SOB, cough, wheeze ABD- denies N/V, change in stools, abd pain GU- denies dysuria, hematuria, dribbling, incontinence MSK- denies joint pain, muscle aches, injury Neuro- denies headache, dizziness, syncope, seizure activity       Objective:    BP 128/72 mmHg  Pulse 68  Temp(Src) 98.3 F (36.8 C) (Oral)  Resp 14  Ht 5\' 5"  (1.651 m)  Wt 107 lb (48.535 kg)  BMI 17.81 kg/m2 GEN- NAD, alert and oriented x3 HEENT- PERRL, EOMI, non injected sclera, pink conjunctiva, MMM, oropharynx clear Neck- Supple, no thyromegaly CVS- RRR, no murmur RESP-CTAB ABD-NABS,soft,NT,ND EXT- No edema Pulses- Radial, DP- 2+        Assessment & Plan:      Problem List Items Addressed This Visit    Protein-calorie malnutrition (HCC)    Weight improved, continue daily supplement, continue remeron at current dose      Diabetes mellitus, type II (HCC)    Continue Januvia, recheck A1C      Relevant Orders   Hemoglobin A1c    Other Visit Diagnoses    Hyperkalemia    -  Primary    Relevant Orders    Basic metabolic panel       Note: This dictation was prepared with Dragon dictation along with smaller phrase technology. Any transcriptional errors that result from this process are unintentional.

## 2016-02-12 NOTE — Assessment & Plan Note (Signed)
Continue Januvia, recheck A1C

## 2016-02-13 ENCOUNTER — Encounter: Payer: Self-pay | Admitting: *Deleted

## 2016-03-20 DIAGNOSIS — B351 Tinea unguium: Secondary | ICD-10-CM | POA: Diagnosis not present

## 2016-03-20 DIAGNOSIS — M79675 Pain in left toe(s): Secondary | ICD-10-CM | POA: Diagnosis not present

## 2016-03-20 DIAGNOSIS — M79674 Pain in right toe(s): Secondary | ICD-10-CM | POA: Diagnosis not present

## 2016-05-17 ENCOUNTER — Ambulatory Visit (INDEPENDENT_AMBULATORY_CARE_PROVIDER_SITE_OTHER): Payer: Medicare Other | Admitting: Family Medicine

## 2016-05-17 ENCOUNTER — Encounter: Payer: Self-pay | Admitting: Family Medicine

## 2016-05-17 VITALS — BP 118/64 | HR 62 | Temp 97.9°F | Resp 14 | Ht 65.0 in | Wt 119.0 lb

## 2016-05-17 DIAGNOSIS — E119 Type 2 diabetes mellitus without complications: Secondary | ICD-10-CM

## 2016-05-17 DIAGNOSIS — F0390 Unspecified dementia without behavioral disturbance: Secondary | ICD-10-CM | POA: Diagnosis not present

## 2016-05-17 DIAGNOSIS — I10 Essential (primary) hypertension: Secondary | ICD-10-CM

## 2016-05-17 DIAGNOSIS — I639 Cerebral infarction, unspecified: Secondary | ICD-10-CM

## 2016-05-17 DIAGNOSIS — Z794 Long term (current) use of insulin: Secondary | ICD-10-CM

## 2016-05-17 DIAGNOSIS — E46 Unspecified protein-calorie malnutrition: Secondary | ICD-10-CM

## 2016-05-17 DIAGNOSIS — Z23 Encounter for immunization: Secondary | ICD-10-CM | POA: Diagnosis not present

## 2016-05-17 LAB — CBC WITH DIFFERENTIAL/PLATELET
BASOS ABS: 0 {cells}/uL (ref 0–200)
Basophils Relative: 0 %
EOS ABS: 288 {cells}/uL (ref 15–500)
Eosinophils Relative: 4 %
HEMATOCRIT: 41.9 % (ref 35.0–45.0)
Hemoglobin: 13.7 g/dL (ref 12.0–15.0)
Lymphocytes Relative: 17 %
Lymphs Abs: 1224 cells/uL (ref 850–3900)
MCH: 26.8 pg — AB (ref 27.0–33.0)
MCHC: 32.7 g/dL (ref 32.0–36.0)
MCV: 81.8 fL (ref 80.0–100.0)
MONO ABS: 792 {cells}/uL (ref 200–950)
Monocytes Relative: 11 %
NEUTROS ABS: 4896 {cells}/uL (ref 1500–7800)
Neutrophils Relative %: 68 %
Platelets: 136 10*3/uL — ABNORMAL LOW (ref 140–400)
RBC: 5.12 MIL/uL — AB (ref 3.80–5.10)
RDW: 14.5 % (ref 11.0–15.0)
WBC: 7.2 10*3/uL (ref 3.8–10.8)

## 2016-05-17 NOTE — Assessment & Plan Note (Signed)
Doing well no behavioral problems no insomnia

## 2016-05-17 NOTE — Patient Instructions (Signed)
F/U 4 months  Flu shot given   

## 2016-05-17 NOTE — Progress Notes (Signed)
   Subjective:    Patient ID: Kristin Grant, female    DOB: 04/29/1941, 75 y.o.   MRN: [id number]  Patient presents for 3 month F/U (is not fasting)  Follow-up chronic medical problems. Diabetes mellitus her last A1c was 5.9% she is taking Januvia currently I stop the metformin. Renal function  looks okay for her age   Fasting blood sugars have been good per report from CNA no readings with her   She  is due for lipid check   We've also been monitoring her weight in January she was 120 pounds her last  2 visit in May June  she's not been eating regularly and eating very small amounts she's also been avoiding meats. We did put her on protein supplement and also on Remeron she taking 3 pounds back when she came for four-week follow-up. Today weight up another 12 pounds, eating meat and using protein supplement on remeron  No other concerns FLu shot   Review Of Systems:  GEN- denies fatigue, fever, weight loss,weakness, recent illness HEENT- denies eye drainage, change in vision, nasal discharge, CVS- denies chest pain, palpitations RESP- denies SOB, cough, wheeze ABD- denies N/V, change in stools, abd pain GU- denies dysuria, hematuria, dribbling, incontinence MSK- denies joint pain, muscle aches, injury Neuro- denies headache, dizziness, syncope, seizure activity       Objective:    BP 118/64 (BP Location: Left Arm, Patient Position: Sitting, Cuff Size: Normal)   Pulse 62   Temp 97.9 F (36.6 C) (Oral)   Resp 14   Ht 5\' 5"  (1.651 m)   Wt 119 lb (54 kg)   BMI 19.80 kg/m  GEN- NAD, alert and oriented x3 HEENT- PERRL, EOMI, non injected sclera, pink conjunctiva, MMM, oropharynx clear Neck- Supple, no thyromegaly CVS- RRR, no murmur RESP-CTAB ABD-NABS,soft,NT,ND EXT- No edema Pulses- Radial, DP- 2+        Assessment & Plan:      Problem List Items Addressed This Visit    Protein-calorie malnutrition (HCC)    He is significantly improved from previous we will  continue with her protein supplements encouraged her eating habits and continue with her Remeron. I'll recheck her labs today including her A1c to make sure her blood sugars still looks good on just the Januvia. Blood pressure is also controlled. He is following her at the assisted living facility Flu Shot given      Hypertension - Primary   Relevant Orders   CBC with Differential/Platelet   Comprehensive metabolic panel   Lipid panel   Diabetes mellitus, type II (HCC)   Relevant Orders   Hemoglobin A1c   Lipid panel   HM DIABETES FOOT EXAM   Dementia    Doing well no behavioral problems no insomnia       Other Visit Diagnoses   None.     Note: This dictation was prepared with Dragon dictation along with smaller phrase technology. Any transcriptional errors that result from this process are unintentional.

## 2016-05-17 NOTE — Addendum Note (Signed)
Addended by: Phillips OdorSIX, CHRISTINA H on: 05/17/2016 10:42 AM   Modules accepted: Orders

## 2016-05-17 NOTE — Assessment & Plan Note (Addendum)
He is significantly improved from previous we will continue with her protein supplements encouraged her eating habits and continue with her Remeron. I'll recheck her labs today including her A1c to make sure her blood sugars still looks good on just the Januvia. Blood pressure is also controlled. He is following her at the assisted living facility Flu Shot given

## 2016-05-18 LAB — LIPID PANEL
CHOLESTEROL: 132 mg/dL (ref 125–200)
HDL: 45 mg/dL — ABNORMAL LOW (ref >=46)
LDL Cholesterol: 62 mg/dL (ref <130)
TRIGLYCERIDES: 126 mg/dL (ref <150)
Total CHOL/HDL Ratio: 2.9 Ratio (ref <=5.0)
VLDL: 25 mg/dL (ref <30)

## 2016-05-18 LAB — HEMOGLOBIN A1C
HEMOGLOBIN A1C: 7 % — AB (ref <5.7)
MEAN PLASMA GLUCOSE: 154 mg/dL

## 2016-05-18 LAB — COMPREHENSIVE METABOLIC PANEL
ALK PHOS: 51 U/L (ref 33–130)
ALT: 10 U/L (ref 6–29)
AST: 12 U/L (ref 10–35)
Albumin: 3.7 g/dL (ref 3.6–5.1)
BUN: 15 mg/dL (ref 7–25)
CALCIUM: 9 mg/dL (ref 8.6–10.4)
CHLORIDE: 104 mmol/L (ref 98–110)
CO2: 30 mmol/L (ref 20–31)
Creat: 0.79 mg/dL (ref 0.60–0.93)
Glucose, Bld: 141 mg/dL — ABNORMAL HIGH (ref 70–99)
POTASSIUM: 4.1 mmol/L (ref 3.5–5.3)
Sodium: 144 mmol/L (ref 135–146)
TOTAL PROTEIN: 6 g/dL — AB (ref 6.1–8.1)
Total Bilirubin: 0.3 mg/dL (ref 0.2–1.2)

## 2016-05-23 ENCOUNTER — Encounter: Payer: Self-pay | Admitting: *Deleted

## 2016-06-12 DIAGNOSIS — H25813 Combined forms of age-related cataract, bilateral: Secondary | ICD-10-CM | POA: Diagnosis not present

## 2016-06-12 DIAGNOSIS — E119 Type 2 diabetes mellitus without complications: Secondary | ICD-10-CM | POA: Diagnosis not present

## 2016-06-12 DIAGNOSIS — Z7984 Long term (current) use of oral hypoglycemic drugs: Secondary | ICD-10-CM | POA: Diagnosis not present

## 2016-07-04 DIAGNOSIS — M79675 Pain in left toe(s): Secondary | ICD-10-CM | POA: Diagnosis not present

## 2016-07-04 DIAGNOSIS — B351 Tinea unguium: Secondary | ICD-10-CM | POA: Diagnosis not present

## 2016-07-04 DIAGNOSIS — M79674 Pain in right toe(s): Secondary | ICD-10-CM | POA: Diagnosis not present

## 2016-08-06 ENCOUNTER — Telehealth: Payer: Self-pay | Admitting: *Deleted

## 2016-08-06 NOTE — Telephone Encounter (Signed)
Change to protonix 40mg once a day  

## 2016-08-06 NOTE — Telephone Encounter (Signed)
[facility]   Reports that Dexilant will no longer be covered on 2018 formulary.   Alternatives include: Omeprazole Pantoprazole  MD please advise.

## 2016-08-07 MED ORDER — PANTOPRAZOLE SODIUM 40 MG PO TBEC: 40.0000 mg | DELAYED_RELEASE_TABLET | Freq: Every day | ORAL | 3 refills | Status: DC

## 2016-08-07 NOTE — Telephone Encounter (Signed)
Prescription sent to pharmacy.   Order faxed to ALF.

## 2016-09-03 ENCOUNTER — Telehealth: Payer: Self-pay | Admitting: *Deleted

## 2016-09-03 NOTE — Telephone Encounter (Signed)
Received request from pharmacy for PA on Januvia.   Formulary alternatives include: Norville Haggardnglyza Tradjenta  MD please advise.

## 2016-09-03 NOTE — Telephone Encounter (Signed)
Change to tradjenta 5mg 

## 2016-09-04 MED ORDER — LINAGLIPTIN 5 MG PO TABS: 5.0000 mg | ORAL_TABLET | Freq: Every day | ORAL | 11 refills | Status: DC

## 2016-09-04 NOTE — Telephone Encounter (Signed)
Prescription sent to pharmacy.   Order faxed to facility.  

## 2016-09-10 ENCOUNTER — Other Ambulatory Visit: Payer: Self-pay | Admitting: *Deleted

## 2016-09-10 ENCOUNTER — Telehealth: Payer: Self-pay | Admitting: Family Medicine

## 2016-09-10 MED ORDER — GLIMEPIRIDE 1 MG PO TABS: 1.0000 mg | ORAL_TABLET | Freq: Every day | ORAL | 3 refills | Status: DC

## 2016-09-10 NOTE — Telephone Encounter (Signed)
Received notation from ALF over the past month her blood sugars have been 280s up to 400 she is currently on Januvia which is supposed to be changed to tradjenta 5 mg due to cost.  In the past she had been on metformin as well as Amaryl and insulin due to decreasing appetite and she was taken off of this to these medications and her last A1c was at 7%. Her blood sugars are now spiking significantly on just the Januvia.  With her age she wants to avoid having to do injections which is understandable. I'm going to put her back on glimepiride 1 mg once a day in addition to her Januvia currently. She has an appointment next week she seems to be eating and drinking well. We'll see how her blood sugars do with this addition will hopefully avoid having to use insulin therapy again

## 2016-09-16 ENCOUNTER — Emergency Department (HOSPITAL_COMMUNITY)
Admission: EM | Admit: 2016-09-16 | Discharge: 2016-09-17 | Disposition: A | Discharge status: Home / Self Care | Payer: Medicare Other | Attending: Emergency Medicine | Admitting: Emergency Medicine

## 2016-09-16 ENCOUNTER — Ambulatory Visit: Payer: Medicare Other | Admitting: Family Medicine

## 2016-09-16 ENCOUNTER — Encounter (HOSPITAL_COMMUNITY): Payer: Self-pay | Admitting: *Deleted

## 2016-09-16 DIAGNOSIS — Y999 Unspecified external cause status: Secondary | ICD-10-CM | POA: Insufficient documentation

## 2016-09-16 DIAGNOSIS — E119 Type 2 diabetes mellitus without complications: Secondary | ICD-10-CM | POA: Insufficient documentation

## 2016-09-16 DIAGNOSIS — S0990XA Unspecified injury of head, initial encounter: Secondary | ICD-10-CM | POA: Diagnosis not present

## 2016-09-16 DIAGNOSIS — Y939 Activity, unspecified: Secondary | ICD-10-CM | POA: Diagnosis not present

## 2016-09-16 DIAGNOSIS — W19XXXA Unspecified fall, initial encounter: Secondary | ICD-10-CM

## 2016-09-16 DIAGNOSIS — Z79899 Other long term (current) drug therapy: Secondary | ICD-10-CM | POA: Insufficient documentation

## 2016-09-16 DIAGNOSIS — W06XXXA Fall from bed, initial encounter: Secondary | ICD-10-CM | POA: Insufficient documentation

## 2016-09-16 DIAGNOSIS — Z7982 Long term (current) use of aspirin: Secondary | ICD-10-CM | POA: Diagnosis not present

## 2016-09-16 DIAGNOSIS — S6992XA Unspecified injury of left wrist, hand and finger(s), initial encounter: Secondary | ICD-10-CM | POA: Diagnosis present

## 2016-09-16 DIAGNOSIS — Z7984 Long term (current) use of oral hypoglycemic drugs: Secondary | ICD-10-CM | POA: Diagnosis not present

## 2016-09-16 DIAGNOSIS — M79602 Pain in left arm: Secondary | ICD-10-CM | POA: Diagnosis not present

## 2016-09-16 DIAGNOSIS — I2510 Atherosclerotic heart disease of native coronary artery without angina pectoris: Secondary | ICD-10-CM | POA: Diagnosis not present

## 2016-09-16 DIAGNOSIS — S064X0A Epidural hemorrhage without loss of consciousness, initial encounter: Secondary | ICD-10-CM | POA: Diagnosis not present

## 2016-09-16 DIAGNOSIS — Y929 Unspecified place or not applicable: Secondary | ICD-10-CM | POA: Diagnosis not present

## 2016-09-16 DIAGNOSIS — E039 Hypothyroidism, unspecified: Secondary | ICD-10-CM | POA: Diagnosis not present

## 2016-09-16 DIAGNOSIS — S299XXA Unspecified injury of thorax, initial encounter: Secondary | ICD-10-CM | POA: Diagnosis not present

## 2016-09-16 DIAGNOSIS — G309 Alzheimer's disease, unspecified: Secondary | ICD-10-CM | POA: Diagnosis not present

## 2016-09-16 DIAGNOSIS — S0003XA Contusion of scalp, initial encounter: Secondary | ICD-10-CM | POA: Diagnosis not present

## 2016-09-16 DIAGNOSIS — S098XXA Other specified injuries of head, initial encounter: Secondary | ICD-10-CM | POA: Diagnosis not present

## 2016-09-16 DIAGNOSIS — S42202A Unspecified fracture of upper end of left humerus, initial encounter for closed fracture: Secondary | ICD-10-CM | POA: Diagnosis not present

## 2016-09-16 DIAGNOSIS — S42212A Unspecified displaced fracture of surgical neck of left humerus, initial encounter for closed fracture: Secondary | ICD-10-CM

## 2016-09-16 DIAGNOSIS — I10 Essential (primary) hypertension: Secondary | ICD-10-CM | POA: Insufficient documentation

## 2016-09-16 DIAGNOSIS — S199XXA Unspecified injury of neck, initial encounter: Secondary | ICD-10-CM | POA: Diagnosis not present

## 2016-09-16 DIAGNOSIS — S42292A Other displaced fracture of upper end of left humerus, initial encounter for closed fracture: Secondary | ICD-10-CM | POA: Diagnosis not present

## 2016-09-16 NOTE — Progress Notes (Deleted)
   Subjective:    Patient ID: Kristin DineElsene Grant, female    DOB: 1940/09/22, 76 y.o.   MRN: [id number]  Patient presents for No chief complaint on file. Here to follow-up medications. I've been receiving notation from her nursing home that her blood sugars have been extremely high mostly ranging 300-400. She was started on glimepiride 1 mg in addition to her Januvia which is being changed to tradjenta due to insurance coverage. She been on metformin in the past but this caused loose bowels and I was concerned about her renal function at her age. She is also been on insulin in the past as well which we're trying to avoid due to discomfort. Her last A1c was 7% 4 months ago. Previous weight 119lbs        Review Of Systems:  GEN- denies fatigue, fever, weight loss,weakness, recent illness HEENT- denies eye drainage, change in vision, nasal discharge, CVS- denies chest pain, palpitations RESP- denies SOB, cough, wheeze ABD- denies N/V, change in stools, abd pain GU- denies dysuria, hematuria, dribbling, incontinence MSK- denies joint pain, muscle aches, injury Neuro- denies headache, dizziness, syncope, seizure activity       Objective:    There were no vitals taken for this visit. GEN- NAD, alert and oriented x3 HEENT- PERRL, EOMI, non injected sclera, pink conjunctiva, MMM, oropharynx clear Neck- Supple, no thyromegaly CVS- RRR, no murmur RESP-CTAB ABD-NABS,soft,NT,ND EXT- No edema Pulses- Radial, DP- 2+        Assessment & Plan:      Problem List Items Addressed This Visit    None      Note: This dictation was prepared with Dragon dictation along with smaller phrase technology. Any transcriptional errors that result from this process are unintentional.

## 2016-09-16 NOTE — ED Triage Notes (Signed)
Pt brought in by [facility] for c/o fall; staff reports to ems that pt was found in floor; pt has hematoma to left side of forehead and c/o left shoulder pain

## 2016-09-17 ENCOUNTER — Emergency Department (HOSPITAL_COMMUNITY): Payer: Medicare Other

## 2016-09-17 ENCOUNTER — Encounter: Payer: Self-pay | Admitting: *Deleted

## 2016-09-17 DIAGNOSIS — S199XXA Unspecified injury of neck, initial encounter: Secondary | ICD-10-CM | POA: Diagnosis not present

## 2016-09-17 DIAGNOSIS — S299XXA Unspecified injury of thorax, initial encounter: Secondary | ICD-10-CM | POA: Diagnosis not present

## 2016-09-17 DIAGNOSIS — S42292A Other displaced fracture of upper end of left humerus, initial encounter for closed fracture: Secondary | ICD-10-CM | POA: Diagnosis not present

## 2016-09-17 DIAGNOSIS — S0003XA Contusion of scalp, initial encounter: Secondary | ICD-10-CM | POA: Diagnosis not present

## 2016-09-17 DIAGNOSIS — S42202A Unspecified fracture of upper end of left humerus, initial encounter for closed fracture: Secondary | ICD-10-CM | POA: Diagnosis not present

## 2016-09-17 MED ORDER — ACETAMINOPHEN 325 MG PO TABS
650.0000 mg | ORAL_TABLET | Freq: Once | ORAL | Status: AC
  Administered 2016-09-17: 650 mg via ORAL
  Filled 2016-09-17: qty 2

## 2016-09-17 NOTE — ED Notes (Signed)
Pt back from CT/xray.

## 2016-09-17 NOTE — ED Provider Notes (Signed)
[facility] DEPT Provider Note   CSN: [id number] Arrival date & time: 09/16/16  2351     History   Chief Complaint Chief Complaint  Patient presents with  . Fall    HPI Kristin Grant is a 76 y.o. female.  Patient presents from nursing facility with fall from bed. States she was found by staff on the floor and reported to have rolled out of bed. Patient has dementia but is oriented to person and place. She has a large hematoma to her forehead and is having some left shoulder pain. Denies any dizziness or lightheadedness. Denies any chest pain or shortness of breath. Denies any neck or back pain. Denies any focal weakness, numbness or tingling. No blood thinner use.   The history is provided by the patient and the EMS personnel.  Fall  Pertinent negatives include no chest pain, no abdominal pain, no headaches and no shortness of breath.    Past Medical History:  Diagnosis Date  . Alzheimer disease   . Arthritis   . Coronary artery disease   . Diabetes mellitus   . Difficulty walking   . Hypertension   . Reflux   . Stroke [facility])     Patient Active Problem List   Diagnosis Date Noted  . Loss of weight 01/12/2016  . CVA (cerebral vascular accident) ([place]) 11/22/2015  . Diabetes mellitus, type II ([place]) 06/22/2014  . Protein-calorie malnutrition ([place]) 12/15/2013  . Fall 08/17/2013  . Back pain 08/17/2013  . Pressure ulcer, stage 1 07/06/2013  . Abnormal development of nail 03/28/2013  . Gait abnormality 12/10/2011  . Dementia 11/21/2011  . Hypertension 11/21/2011  . Hypothyroidism 11/21/2011    History reviewed. No pertinent surgical history.  OB History    No data available       Home Medications    Prior to Admission medications   Medication Sig Start Date End Date Taking? Authorizing Provider  aspirin 81 MG tablet Take 81 mg by mouth daily.    Historical Provider, MD  Blood Glucose Monitoring Suppl (BLOOD GLUCOSE MONITOR SYSTEM) W/DEVICE KIT 1 Units by  Does not apply route daily. 07/19/13   [name], MD  clopidogrel (PLAVIX) 75 MG tablet TAKE 1 TABLET BY MOUTH ONCE DAILY. 08/09/13   [name], MD  glimepiride (AMARYL) 1 MG tablet Take 1 tablet (1 mg total) by mouth daily with breakfast. 09/10/16   [name], MD  levothyroxine (SYNTHROID, LEVOTHROID) 50 MCG tablet TAKE 1 TABLET BY MOUTH ONCE DAILY. 08/09/13   [name], MD  linagliptin (TRADJENTA) 5 MG TABS tablet Take 1 tablet (5 mg total) by mouth daily. 09/04/16   [name], MD  lisinopril (PRINIVIL,ZESTRIL) 40 MG tablet TAKE 1 TABLET BY MOUTH ONCE DAILY. 08/09/13   [name], MD  mirtazapine (REMERON) 7.5 MG tablet Take 1 tablet (7.5 mg total) by mouth at bedtime. For appetite 06/14/15   [name], MD  Multiple Vitamin (DAILY VITE) TABS TAKE 1 TABLET BY MOUTH ONCE DAILY. 08/09/13   [name], MD  pantoprazole (PROTONIX) 40 MG tablet Take 1 tablet (40 mg total) by mouth daily. 08/07/16   [name], MD  propranolol (INDERAL) 40 MG tablet TAKE 1 TABLET BY MOUTH TWICE DAILY. 08/09/13   [name], MD  Q-PAP 500 MG tablet TAKE 1 TABLET BY MOUTH EVERY 6 HOURS AS NEEDED FOR PAIN OR FEVER. 10/04/13   [name], MD  QC PINK BISMUTH 262 MG/15ML  suspension TAKE 1 TABLESPOONFUL (9m) BY MOUTH EVERY 6 HOURS AS NEEDED FOR STOMACH UPSET. 03/24/15   KAlycia Rossetti MD  traMADol (ULTRAM) 50 MG tablet TAKE 1 TABLET BY MOUTH EVERY 6 HOURS AS NEEDED FOR PAIN. 12/22/13   KAlycia Rossetti MD  TUSSIN DM 100-10 MG/5ML liquid TAKE 1 TEASPOONFUL (524m BY MOUTH EVER 4 HOURS AS NEEDED FOR COUGH. 08/30/14   KaAlycia RossettiMD    Family History Family History  Problem Relation Age of Onset  . Hypertension Mother   . Heart disease Mother   . Hypertension Father   . Heart disease Father   . Hypertension Sister   . Heart disease Sister   . Cancer Sister     BREAST   . Hypertension Brother   . Heart disease Brother     Social  History Social History  Substance Use Topics  . Smoking status: Never Smoker  . Smokeless tobacco: Never Used  . Alcohol use No     Allergies   Patient has no known allergies.   Review of Systems Review of Systems  Constitutional: Negative for activity change, appetite change and fever.  HENT: Negative for congestion.   Respiratory: Negative for cough, chest tightness and shortness of breath.   Cardiovascular: Negative for chest pain.  Gastrointestinal: Negative for abdominal pain, nausea and vomiting.  Genitourinary: Negative for dysuria and hematuria.  Musculoskeletal: Positive for arthralgias, joint swelling and myalgias. Negative for back pain and neck pain.  Neurological: Negative for dizziness, weakness, light-headedness and headaches.   A complete 10 system review of systems was obtained and all systems are negative except as noted in the HPI and PMH.    Physical Exam Updated Vital Signs BP 148/81 (BP Location: Left Arm)   Pulse 83   Temp 98.7 F (37.1 C) (Oral)   Resp 18   Ht '5\' 5"'  (1.651 m)   Wt 119 lb (54 kg)   SpO2 94%   BMI 19.80 kg/m   Physical Exam  Constitutional: She is oriented to person, place, and time. She appears well-developed and well-nourished. No distress.  HENT:  Head: Normocephalic.  Mouth/Throat: Oropharynx is clear and moist. No oropharyngeal exudate.  Large hematoma to left forehead Disconjugate gaze Extraocular movements are intact  Eyes: Conjunctivae and EOM are normal. Pupils are equal, round, and reactive to light.  L eye lateral deviation, chronic per report   Neck: Normal range of motion. Neck supple.  No C spine pain  Cardiovascular: Normal rate, regular rhythm, normal heart sounds and intact distal pulses.   No murmur heard. Pulmonary/Chest: Effort normal and breath sounds normal. No respiratory distress.  Abdominal: Soft. There is no tenderness. There is no rebound and no guarding.  Musculoskeletal: Normal range of motion.  She exhibits edema and tenderness.  No T or L spine pain  Abrasion to left shoulder as well as swelling to left shoulder. Intact radial pulses and grip strength  FROM hips without pain  Neurological: She is alert and oriented to person, place, and time. No cranial nerve deficit. She exhibits normal muscle tone. Coordination normal.  No ataxia on finger to nose bilaterally. No pronator drift. 5/5 strength throughout. CN 2-12 intact.Equal grip strength. Sensation intact.   Skin: Skin is warm.  Psychiatric: She has a normal mood and affect. Her behavior is normal.  Nursing note and vitals reviewed.    ED Treatments / Results  Labs (all labs ordered are listed, but only abnormal results are displayed) Labs  Reviewed - No data to display  EKG  EKG Interpretation  Date/Time:  Tuesday September 17 2016 01:41:35 EST Ventricular Rate:  76 PR Interval:    QRS Duration: 141 QT Interval:  459 QTC Calculation: 517 R Axis:   20 Text Interpretation:  Sinus rhythm Consider left atrial enlargement Left bundle branch block No significant change was found Confirmed by Wyvonnia Dusky  MD, Samuell Knoble [phone]) on 09/17/2016 2:04:21 AM       Radiology Ct Head Wo Contrast  Result Date: 09/17/2016 CLINICAL DATA:  Patient found on floor with hematoma of the forehead and left shoulder pain. EXAM: CT HEAD WITHOUT CONTRAST CT CERVICAL SPINE WITHOUT CONTRAST TECHNIQUE: Multidetector CT imaging of the head and cervical spine was performed following the standard protocol without intravenous contrast. Multiplanar CT image reconstructions of the cervical spine were also generated. COMPARISON:  Head CT from 12/11/2015 FINDINGS: CT HEAD FINDINGS Brain: Right posterior parietal scalp lesion is stable consistent with a cyst measuring 2.2 cm AP by 1.5 cm transverse. Left frontal scalp hematoma is noted without underlying fracture. No acute intracranial hemorrhage, midline shift or edema. No acute large vascular territory infarction.  No intra-axial mass, midline shift or mass effect. Cerebral atrophy is stable with chronic mild-to-moderate small vessel ischemic disease. Small remote lacunar infarct within the central pons. Vascular: Prominent intracranial atherosclerosis. Skull: Negative for fracture or focal lesion. Sinuses/Orbits: Nonacute Other: None go CT CERVICAL SPINE FINDINGS Alignment: Normal cervical alignment. Osteoarthritis of the atlantodental interval with joint space narrowing and sclerosis. Craniocervical relationship is intact. Skull base and vertebrae: No skullbase fracture. No cerebellar tonsillar ectopia. Soft tissues and spinal canal: No prevertebral soft tissue swelling. No intraspinal hematoma. Mild disc bulge at C3-4 and C6-7 Disc levels: Mild degenerative disc disease C6-7. Mild central disc bulges C2-3, C3-4 C4-5. Degenerative bilateral facet hypertrophy consistent with osteoarthritis. Mild left C3-4, mild right and moderate left C4-5 and moderate left C5-6 neural foraminal encroachment from facet hypertrophy and uncovertebral joint osteoarthritis. Upper chest: Nonacute Other: None IMPRESSION: 1. Left frontal scalp hematoma without underlying fracture. 2. Chronic mild-to-moderate small vessel ischemic disease. Small remote lacunar infarct within the central pons. 3. No acute intracranial abnormality. 4. 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 5. Cervical spondylosis without acute osseous abnormality. Electronically Signed   By: Ashley Royalty M.D.   On: 09/17/2016 01:19   Ct Cervical Spine Wo Contrast  Result Date: 09/17/2016 CLINICAL DATA:  Patient found on floor with hematoma of the forehead and left shoulder pain. EXAM: CT HEAD WITHOUT CONTRAST CT CERVICAL SPINE WITHOUT CONTRAST TECHNIQUE: Multidetector CT imaging of the head and cervical spine was performed following the standard protocol without intravenous contrast. Multiplanar CT image reconstructions of the cervical spine were also generated.  COMPARISON:  Head CT from 12/11/2015 FINDINGS: CT HEAD FINDINGS Brain: Right posterior parietal scalp lesion is stable consistent with a cyst measuring 2.2 cm AP by 1.5 cm transverse. Left frontal scalp hematoma is noted without underlying fracture. No acute intracranial hemorrhage, midline shift or edema. No acute large vascular territory infarction. No intra-axial mass, midline shift or mass effect. Cerebral atrophy is stable with chronic mild-to-moderate small vessel ischemic disease. Small remote lacunar infarct within the central pons. Vascular: Prominent intracranial atherosclerosis. Skull: Negative for fracture or focal lesion. Sinuses/Orbits: Nonacute Other: None go CT CERVICAL SPINE FINDINGS Alignment: Normal cervical alignment. Osteoarthritis of the atlantodental interval with joint space narrowing and sclerosis. Craniocervical relationship is intact. Skull base and vertebrae: No skullbase fracture. No cerebellar tonsillar  ectopia. Soft tissues and spinal canal: No prevertebral soft tissue swelling. No intraspinal hematoma. Mild disc bulge at C3-4 and C6-7 Disc levels: Mild degenerative disc disease C6-7. Mild central disc bulges C2-3, C3-4 C4-5. Degenerative bilateral facet hypertrophy consistent with osteoarthritis. Mild left C3-4, mild right and moderate left C4-5 and moderate left C5-6 neural foraminal encroachment from facet hypertrophy and uncovertebral joint osteoarthritis. Upper chest: Nonacute Other: None IMPRESSION: 1. Left frontal scalp hematoma without underlying fracture. 2. Chronic mild-to-moderate small vessel ischemic disease. Small remote lacunar infarct within the central pons. 3. No acute intracranial abnormality. 4. 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 5. Cervical spondylosis without acute osseous abnormality. Electronically Signed   By: Ashley Royalty M.D.   On: 09/17/2016 01:19   Dg Shoulder Left  Result Date: 09/17/2016 CLINICAL DATA:  Acute onset of left  shoulder pain after fall out of bed. Initial encounter. EXAM: LEFT SHOULDER - 2+ VIEW COMPARISON:  None. FINDINGS: There is a comminuted fracture involving the left humeral head and neck, with at least 1.5 cm of displacement at the humeral neck, compatible with a Neer 2-part fracture. The left humeral head is seated within the glenoid fossa. The acromioclavicular joint is unremarkable in appearance. Mild soft tissue swelling is noted overlying the left humeral head. Atelectasis or scarring is noted at the left lung base. IMPRESSION: Comminuted fracture involving the left humeral head and neck, with at least 1.5 cm of displacement at the humeral neck, compatible with a Neer 2-part fracture. Electronically Signed   By: Garald Balding M.D.   On: 09/17/2016 01:31    Procedures Procedures (including critical care time)  Medications Ordered in ED Medications - No data to display   Initial Impression / Assessment and Plan / ED Course  I have reviewed the triage vital signs and the nursing notes.  Pertinent labs & imaging results that were available during my care of the patient were reviewed by me and considered in my medical decision making (see chart for details).     Dementia patient who rolled out of bed with head injury and shoulder injury. No loss of consciousness. She is at her neurological baseline.  CT head shows no acute cranial hemorrhage. C-spine is negative. X-ray does show comminuted left humeral head and neck fracture.  Sling placed. Arm NVI.  Patient ambulatory with assistance which is her baseline. Denies pain.  Followup with ortho. She appears stable to return to her facility.    Final Clinical Impressions(s) / ED Diagnoses   Final diagnoses:  Fall, initial encounter  Minor head injury, initial encounter  Fx humeral neck, left, closed, initial encounter    New Prescriptions New Prescriptions   No medications on file     Ezequiel Essex, MD 09/17/16 [id number]

## 2016-09-17 NOTE — ED Notes (Signed)
Pt transported to CT/xray 

## 2016-09-17 NOTE — Discharge Instructions (Signed)
Follow up with Dr. Eulah PontMurphy or Dr. Romeo AppleHarrison for your shoulder fracture. Return to the ED if you develop new or worsening symptoms.

## 2016-09-18 ENCOUNTER — Ambulatory Visit (INDEPENDENT_AMBULATORY_CARE_PROVIDER_SITE_OTHER): Payer: Medicare Other | Admitting: Orthopaedic Surgery

## 2016-09-18 ENCOUNTER — Encounter: Payer: Self-pay | Admitting: Orthopaedic Surgery

## 2016-09-18 ENCOUNTER — Ambulatory Visit (INDEPENDENT_AMBULATORY_CARE_PROVIDER_SITE_OTHER): Payer: Medicare Other

## 2016-09-18 VITALS — BP 134/74 | HR 95 | Temp 97.3°F

## 2016-09-18 DIAGNOSIS — S42202A Unspecified fracture of upper end of left humerus, initial encounter for closed fracture: Secondary | ICD-10-CM | POA: Diagnosis not present

## 2016-09-18 NOTE — Progress Notes (Signed)
Subjective:    Patient ID: Kristin Grant, female    DOB: [birth year], 76 y.o.   MRN: [id number]  HPI She is a resident at [facility].  She fell there and hurt her head and left shoulder. She was seen in the ER on 09-17-16.  CT scan was done of head and neck.  She had x-rays of the left shoulder showing: IMPRESSION: Comminuted fracture involving the left humeral head and neck, with at least 1.5 cm of displacement at the humeral neck, compatible with a Neer 2-part fracture.  She was placed in sling.  I have reviewed the ER record, the x-rays and the x-ray report.  Her pain is controlled.  She is wearing the sling well.  Review of Systems  HENT: Negative for congestion.   Respiratory: Negative for cough and shortness of breath.   Cardiovascular: Negative for chest pain and leg swelling.  Endocrine: Positive for cold intolerance.  Musculoskeletal: Positive for arthralgias and joint swelling.  Allergic/Immunologic: Positive for environmental allergies.  Psychiatric/Behavioral:       Confused as to date   Past Medical History:  Diagnosis Date  . Alzheimer disease   . Arthritis   . Coronary artery disease   . Diabetes mellitus   . Difficulty walking   . Hypertension   . Reflux   . Stroke [facility])     History reviewed. No pertinent surgical history.  Current Outpatient Prescriptions on File Prior to Visit  Medication Sig Dispense Refill  . aspirin 81 MG tablet Take 81 mg by mouth daily.    . Blood Glucose Monitoring Suppl (BLOOD GLUCOSE MONITOR SYSTEM) W/DEVICE KIT 1 Units by Does not apply route daily. 1 each 0  . clopidogrel (PLAVIX) 75 MG tablet TAKE 1 TABLET BY MOUTH ONCE DAILY. 30 tablet 5  . glimepiride (AMARYL) 1 MG tablet Take 1 tablet (1 mg total) by mouth daily with breakfast. 30 tablet 3  . levothyroxine (SYNTHROID, LEVOTHROID) 50 MCG tablet TAKE 1 TABLET BY MOUTH ONCE DAILY. 30 tablet 3  . linagliptin (TRADJENTA) 5 MG TABS tablet Take 1 tablet (5 mg total) by  mouth daily. 30 tablet 11  . lisinopril (PRINIVIL,ZESTRIL) 40 MG tablet TAKE 1 TABLET BY MOUTH ONCE DAILY. 30 tablet 3  . mirtazapine (REMERON) 7.5 MG tablet Take 1 tablet (7.5 mg total) by mouth at bedtime. For appetite 30 tablet 3  . Multiple Vitamin (DAILY VITE) TABS TAKE 1 TABLET BY MOUTH ONCE DAILY. 30 tablet 3  . pantoprazole (PROTONIX) 40 MG tablet Take 1 tablet (40 mg total) by mouth daily. 30 tablet 3  . propranolol (INDERAL) 40 MG tablet TAKE 1 TABLET BY MOUTH TWICE DAILY. 60 tablet 3  . Q-PAP 500 MG tablet TAKE 1 TABLET BY MOUTH EVERY 6 HOURS AS NEEDED FOR PAIN OR FEVER. 30 tablet 0  . QC PINK BISMUTH 262 MG/15ML suspension TAKE 1 TABLESPOONFUL (80m) BY MOUTH EVERY 6 HOURS AS NEEDED FOR STOMACH UPSET. 355 mL 1  . traMADol (ULTRAM) 50 MG tablet TAKE 1 TABLET BY MOUTH EVERY 6 HOURS AS NEEDED FOR PAIN. 45 tablet 3  . TUSSIN DM 100-10 MG/5ML liquid TAKE 1 TEASPOONFUL (580m BY MOUTH EVER 4 HOURS AS NEEDED FOR COUGH. 237 mL 2   No current facility-administered medications on file prior to visit.     Social History   Social History  . Marital status: Widowed    Spouse name: N/A  . Number of children: N/A  . Years of education: N/A  Occupational History  . Not on file.   Social History Main Topics  . Smoking status: Never Smoker  . Smokeless tobacco: Never Used  . Alcohol use No  . Drug use: No  . Sexual activity: Not on file   Other Topics Concern  . Not on file   Social History Narrative  . No narrative on file    Family History  Problem Relation Age of Onset  . Hypertension Mother   . Heart disease Mother   . Hypertension Father   . Heart disease Father   . Hypertension Sister   . Heart disease Sister   . Cancer Sister     BREAST   . Hypertension Brother   . Heart disease Brother     BP 134/74   Pulse 95   Temp 97.3 F (36.3 C)      Objective:   Physical Exam  Constitutional: She is oriented to person, place, and time. She appears well-developed  and well-nourished.  HENT:  Head: Normocephalic and atraumatic.  Eyes: Conjunctivae and EOM are normal. Pupils are equal, round, and reactive to light.  Neck: Normal range of motion. Neck supple.  Cardiovascular: Normal rate, regular rhythm and intact distal pulses.   Pulmonary/Chest: Effort normal.  Abdominal: Soft.  Musculoskeletal: She exhibits tenderness (Pain of left shoulder with decreaed motion.  Ecchymsois present left upper arm.  NV intact.  Contusions of face on left. ).  Neurological: She is alert and oriented to person, place, and time. She displays normal reflexes. No cranial nerve deficit. She exhibits normal muscle tone. Coordination normal.  Skin: Skin is warm and dry.  Psychiatric: She has a normal mood and affect. Her behavior is normal.  Confused as to date and place.     X-rays were done, reported separately of her left shoulder.      Assessment & Plan:   Encounter Diagnosis  Name Primary?  . Closed fracture of proximal end of left humerus, unspecified fracture morphology, initial encounter Yes   Continue sling.  Sleep upright.  X-rays on return of the left shoulder.  Forms for rest home completed.  Call if any problem.  Precautions discussed.  Electronically Signed Sanjuana Kava, MD 1/24/201810:37 AM

## 2016-09-20 ENCOUNTER — Encounter: Payer: Self-pay | Admitting: Family Medicine

## 2016-09-20 ENCOUNTER — Ambulatory Visit (INDEPENDENT_AMBULATORY_CARE_PROVIDER_SITE_OTHER): Payer: Medicare Other | Admitting: Family Medicine

## 2016-09-20 VITALS — BP 126/78 | HR 90 | Temp 98.1°F | Resp 14

## 2016-09-20 DIAGNOSIS — F0150 Vascular dementia without behavioral disturbance: Secondary | ICD-10-CM | POA: Diagnosis not present

## 2016-09-20 DIAGNOSIS — Z794 Long term (current) use of insulin: Secondary | ICD-10-CM | POA: Diagnosis not present

## 2016-09-20 DIAGNOSIS — J069 Acute upper respiratory infection, unspecified: Secondary | ICD-10-CM | POA: Diagnosis not present

## 2016-09-20 DIAGNOSIS — I10 Essential (primary) hypertension: Secondary | ICD-10-CM | POA: Diagnosis not present

## 2016-09-20 DIAGNOSIS — W19XXXA Unspecified fall, initial encounter: Secondary | ICD-10-CM | POA: Diagnosis not present

## 2016-09-20 DIAGNOSIS — E038 Other specified hypothyroidism: Secondary | ICD-10-CM

## 2016-09-20 DIAGNOSIS — E119 Type 2 diabetes mellitus without complications: Secondary | ICD-10-CM | POA: Diagnosis not present

## 2016-09-20 LAB — CBC WITH DIFFERENTIAL/PLATELET
BASOS PCT: 0 %
Basophils Absolute: 0 cells/uL (ref 0–200)
EOS ABS: 864 {cells}/uL — AB (ref 15–500)
Eosinophils Relative: 8 %
HEMATOCRIT: 36.8 % (ref 35.0–45.0)
Hemoglobin: 11.9 g/dL — ABNORMAL LOW (ref 12.0–15.0)
LYMPHS ABS: 1188 {cells}/uL (ref 850–3900)
LYMPHS PCT: 11 %
MCH: 27.4 pg (ref 27.0–33.0)
MCHC: 32.3 g/dL (ref 32.0–36.0)
MCV: 84.6 fL (ref 80.0–100.0)
MONO ABS: 864 {cells}/uL (ref 200–950)
MPV: 10.8 fL (ref 7.5–12.5)
Monocytes Relative: 8 %
Neutro Abs: 7884 cells/uL — ABNORMAL HIGH (ref 1500–7800)
Neutrophils Relative %: 73 %
Platelets: 199 10*3/uL (ref 140–400)
RBC: 4.35 MIL/uL (ref 3.80–5.10)
RDW: 14.9 % (ref 11.0–15.0)
WBC: 10.8 10*3/uL (ref 3.8–10.8)

## 2016-09-20 LAB — T3, FREE: T3, Free: 2 pg/mL — ABNORMAL LOW (ref 2.3–4.2)

## 2016-09-20 LAB — HEMOGLOBIN A1C, FINGERSTICK: HEMOGLOBIN A1C, FINGERSTICK: 10 % — AB (ref <5.7)

## 2016-09-20 LAB — TSH: TSH: 2.66 mIU/L

## 2016-09-20 MED ORDER — DEXTROMETHORPHAN-GUAIFENESIN 10-100 MG/5ML PO SYRP: ORAL_SOLUTION | ORAL | 2 refills | Status: DC

## 2016-09-20 MED ORDER — AZITHROMYCIN 250 MG PO TABS: ORAL_TABLET | ORAL | 0 refills | Status: DC

## 2016-09-20 NOTE — Patient Instructions (Addendum)
Antibiotics, cough medicine sent Check CBG fasting twice a day  F/U 2  weeks

## 2016-09-20 NOTE — Progress Notes (Signed)
Subjective:    Patient ID: Kristin Grant, female    DOB: 29-Nov-1940, 76 y.o.   MRN: [id number]  Patient presents for ER F/U (S/P fall- hematoma/ discoloration to L side forehead, Fx L humeral head)  She here status post fall and to follow-up for diabetes mellitus. She sustained a fall and she rolled out of the bed which resulted in a hematoma to her left for head as well as a black eye but she not have a facial fractures or skull fractures no brain hemorrhage. She did sustain a fracture to her proximal humerus on the left side which she is seeing orthopedics for she's currently muscling and she is on tramadol scheduled for pain.  Diabetes mellitus her blood sugars have been significantly elevated the past month range from 300s to 400s Last A1C 7% IN Sept 2017. I added glimepiride which she had been on in the past in addition to her DPP 4 inhibitor. She is unable tolerate the metformin she has not want  insulin injections again  She's had cough with mild production for the past few days she has not had any fever. I do not have any notations from the facility on when this actually started. She has not been given any cough medicine. Review Of Systems:  GEN- denies fatigue, fever, weight loss,weakness, recent illness HEENT- denies eye drainage, change in vision, nasal discharge, CVS- denies chest pain, palpitations RESP- denies SOB, cough, wheeze ABD- denies N/V, change in stools, abd pain GU- denies dysuria, hematuria, dribbling, incontinence MSK- + joint pain, muscle aches, injury Neuro- denies headache, dizziness, syncope, seizure activity       Objective:    BP 126/78 (BP Location: Right Arm, Patient Position: Sitting, Cuff Size: Normal)   Pulse 90   Temp 98.1 F (36.7 C) (Oral)   Resp 14   SpO2 96%  GEN- NAD, alert and oriented x3,sitting in wheelchair  HEENT- PERRL, EOMI, non injected sclera, pink conjunctiva, MMM, oropharynx clear, left black eye, hematoma left forehead  yellowing/black and blue, mild TTP  Neck- Supple,  CVS- RRR, no murmur RESP- rhonchi bilat, harsh cough, no retractions  ABD-NABS,soft,NT,ND EXT- No edema, LUE in sling, bruising on upper warm  Pulses- Radial, DP- 2+   A1C 10%      Assessment & Plan:      Problem List Items Addressed This Visit    Hypothyroidism - Primary    Recheck TFT  F/U ortho for fracture      Relevant Orders   TSH   T3, free   Hypertension    Controlled       Fall    Fall precautions. Bed rails      Diabetes mellitus, type II (HCC)    A1C in office today 10%, significant change from 3 months ago.  Continue tradjenta and glimiperide which was just added Check CBG F/u 2 weeks with meter Want to avoid hypoglycemia so will not titrate just since she just started glimiperide       Relevant Orders   Hemoglobin A1C, fingerstick (Completed)   CBC with Differential/Platelet   Comprehensive metabolic panel   Dementia    Other Visit Diagnoses    Acute URI       Concern with recent injury, and fracture higher risk of PNA, will start zpak, robitussin DM    Relevant Medications   azithromycin (ZITHROMAX) 250 MG tablet      Note: This dictation was prepared with Dragon dictation along with smaller phrase technology.  Any transcriptional errors that result from this process are unintentional.

## 2016-09-20 NOTE — Assessment & Plan Note (Addendum)
A1C in office today 10%, significant change from 3 months ago.  Continue tradjenta and glimiperide which was just added Check CBG F/u 2 weeks with meter Want to avoid hypoglycemia so will not titrate just since she just started glimiperide

## 2016-09-20 NOTE — Assessment & Plan Note (Signed)
Recheck TFT  F/U ortho for fracture

## 2016-09-20 NOTE — Assessment & Plan Note (Signed)
Controlled.  

## 2016-09-20 NOTE — Assessment & Plan Note (Signed)
Fall precautions. Bed rails

## 2016-09-21 LAB — COMPREHENSIVE METABOLIC PANEL
ALK PHOS: 64 U/L (ref 33–130)
ALT: 8 U/L (ref 6–29)
AST: 9 U/L — AB (ref 10–35)
Albumin: 3.2 g/dL — ABNORMAL LOW (ref 3.6–5.1)
BILIRUBIN TOTAL: 0.6 mg/dL (ref 0.2–1.2)
BUN: 17 mg/dL (ref 7–25)
CALCIUM: 8.7 mg/dL (ref 8.6–10.4)
CO2: 24 mmol/L (ref 20–31)
Chloride: 99 mmol/L (ref 98–110)
Creat: 0.67 mg/dL (ref 0.60–0.93)
GLUCOSE: 266 mg/dL — AB (ref 70–99)
POTASSIUM: 3.8 mmol/L (ref 3.5–5.3)
Sodium: 138 mmol/L (ref 135–146)
TOTAL PROTEIN: 6.1 g/dL (ref 6.1–8.1)

## 2016-09-25 ENCOUNTER — Ambulatory Visit: Payer: Medicare Other | Admitting: Family Medicine

## 2016-09-25 ENCOUNTER — Encounter: Payer: Self-pay | Admitting: *Deleted

## 2016-10-02 ENCOUNTER — Ambulatory Visit (INDEPENDENT_AMBULATORY_CARE_PROVIDER_SITE_OTHER): Payer: Medicare Other

## 2016-10-02 ENCOUNTER — Ambulatory Visit (INDEPENDENT_AMBULATORY_CARE_PROVIDER_SITE_OTHER): Payer: Self-pay | Admitting: Orthopaedic Surgery

## 2016-10-02 DIAGNOSIS — S42202D Unspecified fracture of upper end of left humerus, subsequent encounter for fracture with routine healing: Secondary | ICD-10-CM | POA: Diagnosis not present

## 2016-10-02 DIAGNOSIS — S42292D Other displaced fracture of upper end of left humerus, subsequent encounter for fracture with routine healing: Secondary | ICD-10-CM

## 2016-10-02 NOTE — Progress Notes (Signed)
CC: my shoulder is not hurting as much  She has been using her sling.  She has no new trauma.  X-rays were done of the left shoulder, reported separately.  NV intact.  Encounter Diagnosis  Name Primary?  . Other closed displaced fracture of proximal end of left humerus with routine healing, subsequent encounter Yes   Return in two weeks.  X-rays of the left shoulder  Call if any problem.  Precautions discussed.  Electronically Signed Darreld McleanWayne Vanilla Heatherington, MD 2/7/201810:36 AM

## 2016-10-04 ENCOUNTER — Encounter: Payer: Self-pay | Admitting: Family Medicine

## 2016-10-04 ENCOUNTER — Ambulatory Visit (INDEPENDENT_AMBULATORY_CARE_PROVIDER_SITE_OTHER): Payer: Medicare Other | Admitting: Family Medicine

## 2016-10-04 ENCOUNTER — Telehealth: Payer: Self-pay | Admitting: Family Medicine

## 2016-10-04 VITALS — BP 122/64 | HR 88 | Temp 98.2°F | Resp 16 | Wt 115.9 lb

## 2016-10-04 DIAGNOSIS — E440 Moderate protein-calorie malnutrition: Secondary | ICD-10-CM

## 2016-10-04 DIAGNOSIS — N390 Urinary tract infection, site not specified: Secondary | ICD-10-CM | POA: Diagnosis not present

## 2016-10-04 DIAGNOSIS — E119 Type 2 diabetes mellitus without complications: Secondary | ICD-10-CM | POA: Diagnosis not present

## 2016-10-04 DIAGNOSIS — Z794 Long term (current) use of insulin: Secondary | ICD-10-CM

## 2016-10-04 DIAGNOSIS — R410 Disorientation, unspecified: Secondary | ICD-10-CM | POA: Diagnosis not present

## 2016-10-04 LAB — URINALYSIS, ROUTINE W REFLEX MICROSCOPIC
BILIRUBIN URINE: NEGATIVE
KETONES UR: NEGATIVE
Nitrite: NEGATIVE
SPECIFIC GRAVITY, URINE: 1.02 (ref 1.001–1.035)
pH: 5.5 (ref 5.0–8.0)

## 2016-10-04 LAB — URINALYSIS, MICROSCOPIC ONLY
CRYSTALS: NONE SEEN [HPF]
Casts: NONE SEEN [LPF]

## 2016-10-04 MED ORDER — CEPHALEXIN 500 MG PO CAPS: 500.0000 mg | ORAL_CAPSULE | Freq: Four times a day (QID) | ORAL | 0 refills | Status: DC

## 2016-10-04 NOTE — Telephone Encounter (Signed)
Call placed to patient and [name] made aware.   Order faxed to facility.

## 2016-10-04 NOTE — Telephone Encounter (Signed)
[facility]   States that patient cannot afford Boost.   Patient is getting sugar free house supplement from facility once daily. Advised that if patient takes supplement more than once daily, she has increased loose stools.   MD please advise.

## 2016-10-04 NOTE — Assessment & Plan Note (Signed)
Blood sugars are much improved will continue with her current regimen she has not had any hypoglycemia

## 2016-10-04 NOTE — Patient Instructions (Signed)
Take antibiotics for urine infection  Try Vanilla shakes  F/U 3 months

## 2016-10-04 NOTE — Telephone Encounter (Signed)
Okay give once a day, make sure she finishes, also needs to keep fluids up

## 2016-10-04 NOTE — Progress Notes (Signed)
Call placed to [facility] [phone]~ telephone/ [phone]~ fax. Spoke with Lupita Leashonna.   FSBS readings are as follows:  AM PM 2/1 210 146 2/2 200 146 2/3 182 133 2/4 163 148  2/5 249 138 2/6 167 196 2/7 165 146 2/8 175 302 2/9 126

## 2016-10-04 NOTE — Progress Notes (Signed)
   Subjective:    Patient ID: Kristin DineElsene Grant, female    DOB: Dec 29, 1940, 76 y.o.   MRN: [id number]  Patient presents for Follow-up (S/P Fx) and Increaesd Confusion (facility wants her to be checked for UTI since she is having increased confusion) Increased confusion, she has not been eating or driking as much as possible, stats she doesn't want it.Has been talking about horses as well, that are not present. No fever, has not been combative Recently completed zpak, robitussin for chest cold, now improved ,Had knee fever vomiting does not complain of any abdominal pain   Seen by ortho for fracture of left arm, improving, has sling another 2 weeks, bruising on face also improved   DM- CBG this AM 126, called facility CBG have been  160-200 fasting, had 1 elevated 300 last night  no hypoglycemia, recent A1C 10%    Review Of Systems:  GEN- denies fatigue, fever, weight loss,weakness, recent illness HEENT- denies eye drainage, change in vision, nasal discharge, CVS- denies chest pain, palpitations RESP- denies SOB, cough, wheeze ABD- denies N/V, change in stools, abd pain GU- denies dysuria, hematuria, dribbling, incontinence MSK- denies joint pain, muscle aches, injury Neuro- denies headache, dizziness, syncope, seizure activity       Objective:    BP 122/64   Pulse 88   Temp 98.2 F (36.8 C) (Oral)   Resp 16   Wt 115 lb 14.4 oz (52.6 kg)   SpO2 98%   BMI 19.29 kg/m  GEN- NAD, alert and oriented x person/place sitting in wheelchair  HEENT- PERRL, EOMI, non injected sclera, pink conjunctiva, MMM, oropharynx clear, left black eye, minimal hematoma left forehead yellowing/black and blue, NT  Neck- Supple,  CVS- RRR, no murmur RESP- CTAB ABD-NABS,soft,NT,ND EXT- No edema, LUE in sling, bruising on upper warm  Pulses- Radial, DP- 2+         Assessment & Plan:      Problem List Items Addressed This Visit    Protein-calorie malnutrition (HCC)    Her appetite off and waxes  and wanes she has had a recent fracture which I think is also curious is some decreased appetite and now also appears to have urinary tract infection. We'll start her on cephalexin for urinary hesitancy for culture. Discuss increasing her fluids and to drink her shakes as this will help with her healing process.      Diabetes mellitus, type II (HCC)    Blood sugars are much improved will continue with her current regimen she has not had any hypoglycemia       Other Visit Diagnoses    Urinary tract infection without hematuria, site unspecified    -  Primary   Relevant Medications   cephALEXin (KEFLEX) 500 MG capsule   Other Relevant Orders   Urinalysis, Routine w reflex microscopic (Completed)   Urine culture      Note: This dictation was prepared with Dragon dictation along with smaller phrase technology. Any transcriptional errors that result from this process are unintentional.

## 2016-10-04 NOTE — Assessment & Plan Note (Signed)
Her appetite off and waxes and wanes she has had a recent fracture which I think is also curious is some decreased appetite and now also appears to have urinary tract infection. We'll start her on cephalexin for urinary hesitancy for culture. Discuss increasing her fluids and to drink her shakes as this will help with her healing process.

## 2016-10-04 NOTE — Telephone Encounter (Signed)
[name] [facility] calling to discuss the expense of boost that dr Jeanice Limdurham wanted her to take, she would like you to call her at [phone]

## 2016-10-06 LAB — URINE CULTURE

## 2016-10-15 ENCOUNTER — Other Ambulatory Visit: Payer: Self-pay | Admitting: Radiology

## 2016-10-15 DIAGNOSIS — S42292D Other displaced fracture of upper end of left humerus, subsequent encounter for fracture with routine healing: Secondary | ICD-10-CM

## 2016-10-16 ENCOUNTER — Encounter: Payer: Self-pay | Admitting: Orthopaedic Surgery

## 2016-10-16 ENCOUNTER — Ambulatory Visit (HOSPITAL_COMMUNITY)
Admission: RE | Admit: 2016-10-16 | Discharge: 2016-10-16 | Disposition: A | Discharge status: Home / Self Care | Payer: Medicare Other | Source: Ambulatory Visit | Attending: Orthopaedic Surgery | Admitting: Orthopaedic Surgery

## 2016-10-16 ENCOUNTER — Ambulatory Visit (INDEPENDENT_AMBULATORY_CARE_PROVIDER_SITE_OTHER): Payer: Self-pay | Admitting: Orthopaedic Surgery

## 2016-10-16 VITALS — BP 128/68 | HR 107 | Temp 97.7°F | Resp 16 | Wt 115.0 lb

## 2016-10-16 DIAGNOSIS — S42292D Other displaced fracture of upper end of left humerus, subsequent encounter for fracture with routine healing: Secondary | ICD-10-CM

## 2016-10-16 DIAGNOSIS — S42212A Unspecified displaced fracture of surgical neck of left humerus, initial encounter for closed fracture: Secondary | ICD-10-CM | POA: Diagnosis not present

## 2016-10-16 DIAGNOSIS — X58XXXD Exposure to other specified factors, subsequent encounter: Secondary | ICD-10-CM | POA: Insufficient documentation

## 2016-10-16 NOTE — Progress Notes (Signed)
CC:  My shoulder is sore  She has fracture of the left proximal humerus.  She is in a sling.  NV intact.  X-rays were done at the hospital earlier today.  Encounter Diagnosis  Name Primary?  . Other closed displaced fracture of proximal end of left humerus with routine healing, subsequent encounter Yes   She is at a rest home.  I will begin PT for her.  Return in three weeks.  Call if any problem.  Precautions given.  Electronically Signed Darreld McleanWayne Sarvesh Meddaugh, MD 2/21/201810:59 AM

## 2016-10-21 DIAGNOSIS — B351 Tinea unguium: Secondary | ICD-10-CM | POA: Diagnosis not present

## 2016-10-21 DIAGNOSIS — M79674 Pain in right toe(s): Secondary | ICD-10-CM | POA: Diagnosis not present

## 2016-10-21 DIAGNOSIS — M79675 Pain in left toe(s): Secondary | ICD-10-CM | POA: Diagnosis not present

## 2016-11-04 ENCOUNTER — Encounter: Payer: Self-pay | Admitting: Orthopaedic Surgery

## 2016-11-05 ENCOUNTER — Ambulatory Visit: Payer: Medicare Other | Admitting: Orthopaedic Surgery

## 2016-11-06 ENCOUNTER — Ambulatory Visit: Payer: Medicare Other | Admitting: Orthopaedic Surgery

## 2016-11-07 ENCOUNTER — Ambulatory Visit (INDEPENDENT_AMBULATORY_CARE_PROVIDER_SITE_OTHER): Payer: Self-pay | Admitting: Orthopaedic Surgery

## 2016-11-07 ENCOUNTER — Encounter: Payer: Self-pay | Admitting: Orthopaedic Surgery

## 2016-11-07 ENCOUNTER — Ambulatory Visit (INDEPENDENT_AMBULATORY_CARE_PROVIDER_SITE_OTHER): Payer: Medicare Other

## 2016-11-07 DIAGNOSIS — S42292D Other displaced fracture of upper end of left humerus, subsequent encounter for fracture with routine healing: Secondary | ICD-10-CM | POA: Diagnosis not present

## 2016-11-07 NOTE — Progress Notes (Signed)
CC:  My shoulder is not as sore  She is doing well with the sling.  She has less pain.  NV intact.  X-rays were done today, reported separately.  Encounter Diagnosis  Name Primary?  . Other closed displaced fracture of proximal end of left humerus with routine healing, subsequent encounter Yes   I will have PT begin with gentle ROM to the left shoulder.  Forms for rest home completed.  Return in two weeks, x-rays then.  Call if any problem.  Precautions discussed.  Electronically Signed Darreld McleanWayne Alejandria Wessells, MD 3/15/201811:04 AM

## 2016-11-20 ENCOUNTER — Ambulatory Visit (INDEPENDENT_AMBULATORY_CARE_PROVIDER_SITE_OTHER): Payer: Self-pay | Admitting: Orthopaedic Surgery

## 2016-11-20 ENCOUNTER — Encounter: Payer: Self-pay | Admitting: Orthopaedic Surgery

## 2016-11-20 ENCOUNTER — Ambulatory Visit (INDEPENDENT_AMBULATORY_CARE_PROVIDER_SITE_OTHER): Payer: Medicare Other

## 2016-11-20 DIAGNOSIS — S42292D Other displaced fracture of upper end of left humerus, subsequent encounter for fracture with routine healing: Secondary | ICD-10-CM

## 2016-11-20 NOTE — Progress Notes (Signed)
CC:  My shoulder is better  She has been doing exercises with the left shoulder.  She has little pain now.  NV intact.  Motion is fair of the shoulder on the left with no pain.   Encounter Diagnosis  Name Primary?  . Other closed displaced fracture of proximal end of left humerus with routine healing, subsequent encounter Yes   Continue PT of the left shoulder.  Return in three weeks.  X-rays of the left shoulder on return.  Forms completed for the rest home.  Call if any problem.  Precautions discussed.  Electronically Signed Darreld McleanWayne Amany Rando, MD 3/28/20189:07 AM

## 2016-11-21 ENCOUNTER — Ambulatory Visit: Payer: Medicare Other | Admitting: Orthopaedic Surgery

## 2016-11-22 DIAGNOSIS — F0280 Dementia in other diseases classified elsewhere without behavioral disturbance: Secondary | ICD-10-CM | POA: Diagnosis not present

## 2016-11-22 DIAGNOSIS — G309 Alzheimer's disease, unspecified: Secondary | ICD-10-CM | POA: Diagnosis not present

## 2016-11-22 DIAGNOSIS — Z7984 Long term (current) use of oral hypoglycemic drugs: Secondary | ICD-10-CM | POA: Diagnosis not present

## 2016-11-22 DIAGNOSIS — S42202D Unspecified fracture of upper end of left humerus, subsequent encounter for fracture with routine healing: Secondary | ICD-10-CM | POA: Diagnosis not present

## 2016-11-22 DIAGNOSIS — E119 Type 2 diabetes mellitus without complications: Secondary | ICD-10-CM | POA: Diagnosis not present

## 2016-11-22 DIAGNOSIS — Z5181 Encounter for therapeutic drug level monitoring: Secondary | ICD-10-CM | POA: Diagnosis not present

## 2016-11-25 DIAGNOSIS — Z5181 Encounter for therapeutic drug level monitoring: Secondary | ICD-10-CM | POA: Diagnosis not present

## 2016-11-25 DIAGNOSIS — E119 Type 2 diabetes mellitus without complications: Secondary | ICD-10-CM | POA: Diagnosis not present

## 2016-11-25 DIAGNOSIS — Z7984 Long term (current) use of oral hypoglycemic drugs: Secondary | ICD-10-CM | POA: Diagnosis not present

## 2016-11-25 DIAGNOSIS — S42202D Unspecified fracture of upper end of left humerus, subsequent encounter for fracture with routine healing: Secondary | ICD-10-CM | POA: Diagnosis not present

## 2016-11-25 DIAGNOSIS — G309 Alzheimer's disease, unspecified: Secondary | ICD-10-CM | POA: Diagnosis not present

## 2016-11-25 DIAGNOSIS — F0280 Dementia in other diseases classified elsewhere without behavioral disturbance: Secondary | ICD-10-CM | POA: Diagnosis not present

## 2016-11-26 ENCOUNTER — Other Ambulatory Visit: Payer: Self-pay | Admitting: Family Medicine

## 2016-11-27 DIAGNOSIS — G309 Alzheimer's disease, unspecified: Secondary | ICD-10-CM | POA: Diagnosis not present

## 2016-11-27 DIAGNOSIS — S42202D Unspecified fracture of upper end of left humerus, subsequent encounter for fracture with routine healing: Secondary | ICD-10-CM | POA: Diagnosis not present

## 2016-11-27 DIAGNOSIS — Z7984 Long term (current) use of oral hypoglycemic drugs: Secondary | ICD-10-CM | POA: Diagnosis not present

## 2016-11-27 DIAGNOSIS — F0280 Dementia in other diseases classified elsewhere without behavioral disturbance: Secondary | ICD-10-CM | POA: Diagnosis not present

## 2016-11-27 DIAGNOSIS — E119 Type 2 diabetes mellitus without complications: Secondary | ICD-10-CM | POA: Diagnosis not present

## 2016-11-27 DIAGNOSIS — Z5181 Encounter for therapeutic drug level monitoring: Secondary | ICD-10-CM | POA: Diagnosis not present

## 2016-11-29 DIAGNOSIS — Z5181 Encounter for therapeutic drug level monitoring: Secondary | ICD-10-CM | POA: Diagnosis not present

## 2016-11-29 DIAGNOSIS — Z7984 Long term (current) use of oral hypoglycemic drugs: Secondary | ICD-10-CM | POA: Diagnosis not present

## 2016-11-29 DIAGNOSIS — F0280 Dementia in other diseases classified elsewhere without behavioral disturbance: Secondary | ICD-10-CM | POA: Diagnosis not present

## 2016-11-29 DIAGNOSIS — E119 Type 2 diabetes mellitus without complications: Secondary | ICD-10-CM | POA: Diagnosis not present

## 2016-11-29 DIAGNOSIS — S42202D Unspecified fracture of upper end of left humerus, subsequent encounter for fracture with routine healing: Secondary | ICD-10-CM | POA: Diagnosis not present

## 2016-11-29 DIAGNOSIS — G309 Alzheimer's disease, unspecified: Secondary | ICD-10-CM | POA: Diagnosis not present

## 2016-12-02 DIAGNOSIS — Z7984 Long term (current) use of oral hypoglycemic drugs: Secondary | ICD-10-CM | POA: Diagnosis not present

## 2016-12-02 DIAGNOSIS — G309 Alzheimer's disease, unspecified: Secondary | ICD-10-CM | POA: Diagnosis not present

## 2016-12-02 DIAGNOSIS — F0280 Dementia in other diseases classified elsewhere without behavioral disturbance: Secondary | ICD-10-CM | POA: Diagnosis not present

## 2016-12-02 DIAGNOSIS — S42202D Unspecified fracture of upper end of left humerus, subsequent encounter for fracture with routine healing: Secondary | ICD-10-CM | POA: Diagnosis not present

## 2016-12-02 DIAGNOSIS — Z5181 Encounter for therapeutic drug level monitoring: Secondary | ICD-10-CM | POA: Diagnosis not present

## 2016-12-02 DIAGNOSIS — E119 Type 2 diabetes mellitus without complications: Secondary | ICD-10-CM | POA: Diagnosis not present

## 2016-12-04 DIAGNOSIS — Z5181 Encounter for therapeutic drug level monitoring: Secondary | ICD-10-CM | POA: Diagnosis not present

## 2016-12-04 DIAGNOSIS — F0280 Dementia in other diseases classified elsewhere without behavioral disturbance: Secondary | ICD-10-CM | POA: Diagnosis not present

## 2016-12-04 DIAGNOSIS — E119 Type 2 diabetes mellitus without complications: Secondary | ICD-10-CM | POA: Diagnosis not present

## 2016-12-04 DIAGNOSIS — S42202D Unspecified fracture of upper end of left humerus, subsequent encounter for fracture with routine healing: Secondary | ICD-10-CM | POA: Diagnosis not present

## 2016-12-04 DIAGNOSIS — Z7984 Long term (current) use of oral hypoglycemic drugs: Secondary | ICD-10-CM | POA: Diagnosis not present

## 2016-12-04 DIAGNOSIS — G309 Alzheimer's disease, unspecified: Secondary | ICD-10-CM | POA: Diagnosis not present

## 2016-12-06 ENCOUNTER — Other Ambulatory Visit: Payer: Self-pay | Admitting: *Deleted

## 2016-12-06 ENCOUNTER — Telehealth: Payer: Self-pay | Admitting: Family Medicine

## 2016-12-06 NOTE — Telephone Encounter (Signed)
Forms have been completed and faxed back.

## 2016-12-06 NOTE — Telephone Encounter (Signed)
Lupita Leash from [facility] called needing physician orders for this patient. Please fax them to her.

## 2016-12-09 DIAGNOSIS — G309 Alzheimer's disease, unspecified: Secondary | ICD-10-CM | POA: Diagnosis not present

## 2016-12-09 DIAGNOSIS — F0280 Dementia in other diseases classified elsewhere without behavioral disturbance: Secondary | ICD-10-CM | POA: Diagnosis not present

## 2016-12-09 DIAGNOSIS — Z5181 Encounter for therapeutic drug level monitoring: Secondary | ICD-10-CM | POA: Diagnosis not present

## 2016-12-09 DIAGNOSIS — S42202D Unspecified fracture of upper end of left humerus, subsequent encounter for fracture with routine healing: Secondary | ICD-10-CM | POA: Diagnosis not present

## 2016-12-09 DIAGNOSIS — E119 Type 2 diabetes mellitus without complications: Secondary | ICD-10-CM | POA: Diagnosis not present

## 2016-12-09 DIAGNOSIS — Z7984 Long term (current) use of oral hypoglycemic drugs: Secondary | ICD-10-CM | POA: Diagnosis not present

## 2016-12-10 DIAGNOSIS — S42202D Unspecified fracture of upper end of left humerus, subsequent encounter for fracture with routine healing: Secondary | ICD-10-CM | POA: Diagnosis not present

## 2016-12-10 DIAGNOSIS — Z7984 Long term (current) use of oral hypoglycemic drugs: Secondary | ICD-10-CM | POA: Diagnosis not present

## 2016-12-10 DIAGNOSIS — F0280 Dementia in other diseases classified elsewhere without behavioral disturbance: Secondary | ICD-10-CM | POA: Diagnosis not present

## 2016-12-10 DIAGNOSIS — G309 Alzheimer's disease, unspecified: Secondary | ICD-10-CM | POA: Diagnosis not present

## 2016-12-10 DIAGNOSIS — Z5181 Encounter for therapeutic drug level monitoring: Secondary | ICD-10-CM | POA: Diagnosis not present

## 2016-12-10 DIAGNOSIS — E119 Type 2 diabetes mellitus without complications: Secondary | ICD-10-CM | POA: Diagnosis not present

## 2016-12-11 ENCOUNTER — Encounter: Payer: Self-pay | Admitting: Orthopaedic Surgery

## 2016-12-11 ENCOUNTER — Ambulatory Visit (INDEPENDENT_AMBULATORY_CARE_PROVIDER_SITE_OTHER): Payer: Self-pay | Admitting: Orthopaedic Surgery

## 2016-12-11 ENCOUNTER — Ambulatory Visit (INDEPENDENT_AMBULATORY_CARE_PROVIDER_SITE_OTHER): Payer: Medicare Other

## 2016-12-11 DIAGNOSIS — S42292D Other displaced fracture of upper end of left humerus, subsequent encounter for fracture with routine healing: Secondary | ICD-10-CM | POA: Diagnosis not present

## 2016-12-11 NOTE — Progress Notes (Signed)
CC:  My shoulder is not hurting  She is doing well with the left shoulder.  She has no new trauma and is doing her exercises.  NV intact.  ROM is good.  X-rays were taken, reported separately.  Encounter Diagnosis  Name Primary?  . Other closed displaced fracture of proximal end of left humerus with routine healing, subsequent encounter Yes   Discharge.  Forms completed for rest home.  Electronically Signed Darreld Mclean, MD 4/18/20189:07 AM

## 2016-12-27 DIAGNOSIS — H25813 Combined forms of age-related cataract, bilateral: Secondary | ICD-10-CM | POA: Diagnosis not present

## 2016-12-27 DIAGNOSIS — E119 Type 2 diabetes mellitus without complications: Secondary | ICD-10-CM | POA: Diagnosis not present

## 2016-12-27 DIAGNOSIS — Z7984 Long term (current) use of oral hypoglycemic drugs: Secondary | ICD-10-CM | POA: Diagnosis not present

## 2016-12-30 DIAGNOSIS — M79674 Pain in right toe(s): Secondary | ICD-10-CM | POA: Diagnosis not present

## 2016-12-30 DIAGNOSIS — M79675 Pain in left toe(s): Secondary | ICD-10-CM | POA: Diagnosis not present

## 2016-12-30 DIAGNOSIS — B351 Tinea unguium: Secondary | ICD-10-CM | POA: Diagnosis not present

## 2017-01-01 ENCOUNTER — Encounter: Payer: Self-pay | Admitting: Family Medicine

## 2017-01-01 ENCOUNTER — Ambulatory Visit (INDEPENDENT_AMBULATORY_CARE_PROVIDER_SITE_OTHER): Payer: Medicare Other | Admitting: Family Medicine

## 2017-01-01 VITALS — BP 118/68 | HR 92 | Temp 98.8°F | Resp 14 | Ht 64.0 in | Wt 123.0 lb

## 2017-01-01 DIAGNOSIS — Z794 Long term (current) use of insulin: Secondary | ICD-10-CM | POA: Diagnosis not present

## 2017-01-01 DIAGNOSIS — M810 Age-related osteoporosis without current pathological fracture: Secondary | ICD-10-CM

## 2017-01-01 DIAGNOSIS — I10 Essential (primary) hypertension: Secondary | ICD-10-CM | POA: Diagnosis not present

## 2017-01-01 DIAGNOSIS — E119 Type 2 diabetes mellitus without complications: Secondary | ICD-10-CM

## 2017-01-01 DIAGNOSIS — F0150 Vascular dementia without behavioral disturbance: Secondary | ICD-10-CM

## 2017-01-01 LAB — CBC WITH DIFFERENTIAL/PLATELET
BASOS PCT: 0 %
Basophils Absolute: 0 cells/uL (ref 0–200)
Eosinophils Absolute: 261 cells/uL (ref 15–500)
Eosinophils Relative: 3 %
HEMATOCRIT: 40 % (ref 35.0–45.0)
HEMOGLOBIN: 12.6 g/dL (ref 12.0–15.0)
LYMPHS ABS: 1914 {cells}/uL (ref 850–3900)
Lymphocytes Relative: 22 %
MCH: 26.8 pg — ABNORMAL LOW (ref 27.0–33.0)
MCHC: 31.5 g/dL — ABNORMAL LOW (ref 32.0–36.0)
MCV: 85.1 fL (ref 80.0–100.0)
MONO ABS: 696 {cells}/uL (ref 200–950)
Monocytes Relative: 8 %
NEUTROS ABS: 5829 {cells}/uL (ref 1500–7800)
Neutrophils Relative %: 67 %
Platelets: 120 10*3/uL — ABNORMAL LOW (ref 140–400)
RBC: 4.7 MIL/uL (ref 3.80–5.10)
RDW: 14.4 % (ref 11.0–15.0)
WBC: 8.7 10*3/uL (ref 3.8–10.8)

## 2017-01-01 LAB — BASIC METABOLIC PANEL
BUN: 14 mg/dL (ref 7–25)
CHLORIDE: 106 mmol/L (ref 98–110)
CO2: 26 mmol/L (ref 20–31)
Calcium: 8.8 mg/dL (ref 8.6–10.4)
Creat: 0.86 mg/dL (ref 0.60–0.93)
GLUCOSE: 184 mg/dL — AB (ref 70–99)
POTASSIUM: 4.2 mmol/L (ref 3.5–5.3)
Sodium: 144 mmol/L (ref 135–146)

## 2017-01-01 MED ORDER — GLIMEPIRIDE 2 MG PO TABS: 2.0000 mg | ORAL_TABLET | Freq: Every day | ORAL | 2 refills | Status: DC

## 2017-01-01 NOTE — Progress Notes (Signed)
   Subjective:    Patient ID: Kristin Grant, female    DOB: 05/14/1941, 76 y.o.   MRN: [id number]  Patient presents for 3 month F/U (is not fasting)   Pt here to f/u DM, received notation from ALF that her Blood sugars had been elevated. See previous notes, her Amaryl was increased to 2mg  once a day. Her Last A1C was 10% in Jan 2018. Her weight is up 4lbs since our last visit in Jan  She has been discharged from orthopedics since her fracture is now healed. She is due for Bone Density  particular concerns today. No CBG readings in her ALF packet today  Medication reconcilation they have still been giving 1mg  of amaryl      Review Of Systems:  GEN- denies fatigue, fever, weight loss,weakness, recent illness HEENT- denies eye drainage, change in vision, nasal discharge, CVS- denies chest pain, palpitations RESP- denies SOB, cough, wheeze ABD- denies N/V, change in stools, abd pain GU- denies dysuria, hematuria, dribbling, incontinence MSK- denies joint pain, muscle aches, injury Neuro- denies headache, dizziness, syncope, seizure activity       Objective:    BP 118/68   Pulse 92   Temp 98.8 F (37.1 C) (Oral)   Resp 14   Ht 5\' 4"  (1.626 m)   Wt 123 lb (55.8 kg)   SpO2 98%   BMI 21.11 kg/m  GEN- NAD, alert and oriented x3 HEENT- PERRL, EOMI, non injected sclera, pink conjunctiva, MMM, oropharynx clear Neck- Supple, no thyromegaly CVS- RRR, no murmur RESP-CTAB ABD-NABS,soft,NT,ND EXT- No edema Pulses- Radial, DP- 2+        Assessment & Plan:      Problem List Items Addressed This Visit    Hypertension - Primary    Controlled no changes       Relevant Orders   CBC with Differential/Platelet   Basic metabolic panel   Diabetes mellitus, type II (HCC)    Uncontrolled, recheck A1C goal < 8% Continue tradjenta, Amaryl is to be increased to 2mg  once a day  She has good weight gain and improved appetite at todays visit      Relevant Medications   glimepiride  (AMARYL) 2 MG tablet   Other Relevant Orders   Basic metabolic panel   Hemoglobin A1c   Dementia    Other Visit Diagnoses    Age-related osteoporosis without current pathological fracture       Recent pathological fracture, needs Bone Density to assess    Relevant Orders   DG Bone Density      Note: This dictation was prepared with Dragon dictation along with smaller phrase technology. Any transcriptional errors that result from this process are unintentional.

## 2017-01-01 NOTE — Assessment & Plan Note (Signed)
Uncontrolled, recheck A1C goal < 8% Continue tradjenta, Amaryl is to be increased to 2mg  once a day  She has good weight gain and improved appetite at todays visit

## 2017-01-01 NOTE — Assessment & Plan Note (Signed)
Controlled no changes 

## 2017-01-01 NOTE — Patient Instructions (Signed)
Bone Density to be done AMARYL SHOULD BE 2MG  ONCE A DAY  We will call with lab results F/U 4 months

## 2017-01-02 ENCOUNTER — Encounter: Payer: Self-pay | Admitting: *Deleted

## 2017-01-02 LAB — HEMOGLOBIN A1C
Hgb A1c MFr Bld: 6.9 % — ABNORMAL HIGH (ref <5.7)
MEAN PLASMA GLUCOSE: 151 mg/dL

## 2017-01-08 ENCOUNTER — Ambulatory Visit (HOSPITAL_COMMUNITY)
Admission: RE | Admit: 2017-01-08 | Discharge: 2017-01-08 | Disposition: A | Discharge status: Home / Self Care | Payer: Medicare Other | Source: Ambulatory Visit | Attending: Family Medicine | Admitting: Family Medicine

## 2017-01-08 DIAGNOSIS — M810 Age-related osteoporosis without current pathological fracture: Secondary | ICD-10-CM | POA: Diagnosis not present

## 2017-01-09 ENCOUNTER — Encounter: Payer: Self-pay | Admitting: Family Medicine

## 2017-01-09 DIAGNOSIS — M810 Age-related osteoporosis without current pathological fracture: Secondary | ICD-10-CM | POA: Insufficient documentation

## 2017-01-17 ENCOUNTER — Encounter (HOSPITAL_COMMUNITY): Payer: Self-pay | Admitting: Emergency Medicine

## 2017-01-17 ENCOUNTER — Emergency Department (HOSPITAL_COMMUNITY): Payer: Medicare Other

## 2017-01-17 ENCOUNTER — Emergency Department (HOSPITAL_COMMUNITY)
Admission: EM | Admit: 2017-01-17 | Discharge: 2017-01-17 | Disposition: A | Discharge status: Home / Self Care | Payer: Medicare Other | Attending: Emergency Medicine | Admitting: Emergency Medicine

## 2017-01-17 ENCOUNTER — Telehealth: Payer: Self-pay | Admitting: Family Medicine

## 2017-01-17 DIAGNOSIS — E039 Hypothyroidism, unspecified: Secondary | ICD-10-CM | POA: Insufficient documentation

## 2017-01-17 DIAGNOSIS — Z7984 Long term (current) use of oral hypoglycemic drugs: Secondary | ICD-10-CM | POA: Diagnosis not present

## 2017-01-17 DIAGNOSIS — R4182 Altered mental status, unspecified: Secondary | ICD-10-CM | POA: Diagnosis not present

## 2017-01-17 DIAGNOSIS — Z79899 Other long term (current) drug therapy: Secondary | ICD-10-CM | POA: Diagnosis not present

## 2017-01-17 DIAGNOSIS — N390 Urinary tract infection, site not specified: Secondary | ICD-10-CM | POA: Diagnosis not present

## 2017-01-17 DIAGNOSIS — I2510 Atherosclerotic heart disease of native coronary artery without angina pectoris: Secondary | ICD-10-CM | POA: Insufficient documentation

## 2017-01-17 DIAGNOSIS — Z7982 Long term (current) use of aspirin: Secondary | ICD-10-CM | POA: Insufficient documentation

## 2017-01-17 DIAGNOSIS — E119 Type 2 diabetes mellitus without complications: Secondary | ICD-10-CM | POA: Diagnosis not present

## 2017-01-17 DIAGNOSIS — R410 Disorientation, unspecified: Secondary | ICD-10-CM | POA: Diagnosis not present

## 2017-01-17 DIAGNOSIS — I10 Essential (primary) hypertension: Secondary | ICD-10-CM | POA: Diagnosis not present

## 2017-01-17 LAB — COMPREHENSIVE METABOLIC PANEL
ALBUMIN: 3.7 g/dL (ref 3.5–5.0)
ALK PHOS: 63 U/L (ref 38–126)
ALT: 14 U/L (ref 14–54)
AST: 14 U/L — AB (ref 15–41)
Anion gap: 8 (ref 5–15)
BILIRUBIN TOTAL: 0.5 mg/dL (ref 0.3–1.2)
BUN: 18 mg/dL (ref 6–20)
CO2: 30 mmol/L (ref 22–32)
CREATININE: 0.79 mg/dL (ref 0.44–1.00)
Calcium: 8.9 mg/dL (ref 8.9–10.3)
Chloride: 99 mmol/L — ABNORMAL LOW (ref 101–111)
GFR calc Af Amer: >60 mL/min (ref >60)
Glucose, Bld: 210 mg/dL — ABNORMAL HIGH (ref 65–99)
POTASSIUM: 3.6 mmol/L (ref 3.5–5.1)
Sodium: 137 mmol/L (ref 135–145)
TOTAL PROTEIN: 6.2 g/dL — AB (ref 6.5–8.1)

## 2017-01-17 LAB — CBC WITH DIFFERENTIAL/PLATELET
BASOS ABS: 0 10*3/uL (ref 0.0–0.1)
Basophils Relative: 0 %
EOS ABS: 0.3 10*3/uL (ref 0.0–0.7)
Eosinophils Relative: 4 %
HCT: 41.1 % (ref 36.0–46.0)
Hemoglobin: 13.2 g/dL (ref 12.0–15.0)
LYMPHS PCT: 29 %
Lymphs Abs: 2 10*3/uL (ref 0.7–4.0)
MCH: 26.9 pg (ref 26.0–34.0)
MCHC: 32.1 g/dL (ref 30.0–36.0)
MCV: 83.9 fL (ref 78.0–100.0)
MONO ABS: 0.5 10*3/uL (ref 0.1–1.0)
Monocytes Relative: 7 %
Neutro Abs: 4.2 10*3/uL (ref 1.7–7.7)
Neutrophils Relative %: 61 %
PLATELETS: 124 10*3/uL — AB (ref 150–400)
RBC: 4.9 MIL/uL (ref 3.87–5.11)
RDW: 14.3 % (ref 11.5–15.5)
WBC: 6.9 10*3/uL (ref 4.0–10.5)

## 2017-01-17 LAB — URINALYSIS, ROUTINE W REFLEX MICROSCOPIC
Bacteria, UA: NONE SEEN
Bilirubin Urine: NEGATIVE
GLUCOSE, UA: NEGATIVE mg/dL
Hgb urine dipstick: NEGATIVE
Ketones, ur: NEGATIVE mg/dL
Nitrite: NEGATIVE
Protein, ur: NEGATIVE mg/dL
Specific Gravity, Urine: 1.006 (ref 1.005–1.030)
pH: 6 (ref 5.0–8.0)

## 2017-01-17 MED ORDER — DEXTROSE 5 % IV SOLN
1.0000 g | Freq: Once | INTRAVENOUS | Status: AC
  Administered 2017-01-17: 1 g via INTRAVENOUS
  Filled 2017-01-17: qty 10

## 2017-01-17 MED ORDER — CEPHALEXIN 500 MG PO CAPS: 500.0000 mg | ORAL_CAPSULE | Freq: Four times a day (QID) | ORAL | 0 refills | Status: DC

## 2017-01-17 MED ORDER — SODIUM CHLORIDE 0.9 % IV BOLUS (SEPSIS)
1000.0000 mL | Freq: Once | INTRAVENOUS | Status: AC
  Administered 2017-01-17: 1000 mL via INTRAVENOUS

## 2017-01-17 NOTE — Telephone Encounter (Signed)
Call placed to facility.   [name] reports that patient is having behavioral outbursts, calling staff and residents names, refusing medications and meals, and is showing some altered mental status. Reports that patient frequently seen in ER for Sx and Dx with UTI.   Advised that we cannot treat for UTI based on AMS alone. Patient will need to be seen. [name] states that she cannot get patient to office today. [facility]   MD to be made aware.

## 2017-01-17 NOTE — ED Triage Notes (Signed)
Patient from [facility] with caregiver states patient has been confused since awakening this morning around 0700. States "they want her checked for a UTI."

## 2017-01-17 NOTE — ED Provider Notes (Signed)
[place] DEPT Provider Note   CSN: [id number] Arrival date & time: 01/17/17  1307  By signing my name below, I, Dora Sims, attest that this documentation has been prepared under the direction and in the presence of physician practitioner, Milton Ferguson, MD. Electronically Signed: Dora Sims, Scribe. 01/17/2017. 1:49 PM.  History   Chief Complaint Chief Complaint  Patient presents with  . Altered Mental Status   The history is provided by the patient and the nursing home. No language interpreter was used.  Altered Mental Status   This is a chronic problem. The current episode started 3 to 5 hours ago. The problem has not changed since onset.Her past medical history is significant for diabetes, hypertension, dementia and heart disease.   HPI Comments: LEVEL 5 CAVEAT DUE TO ALTERED MENTAL STATUS Kristin Grant is a 76 y.o. female brought in by nursing home staff, with PMHx including Alzheimer's, CAD, DM, and HTN who presents to the Emergency Department for evaluation of altered mental status. Staff member at her nursing home reports that patient has been more confused than usual since waking this morning. Staff member notes that patient has had similar mental status changes when she has a UTI. Patient denies pain or any other complaints at this time. She lives at [facility].   Past Medical History:  Diagnosis Date  . Alzheimer disease   . Arthritis   . Coronary artery disease   . Diabetes mellitus   . Difficulty walking   . Hypertension   . Reflux   . Stroke [facility])     Patient Active Problem List   Diagnosis Date Noted  . Osteoporosis 01/09/2017  . Loss of weight 01/12/2016  . CVA (cerebral vascular accident) ([place]) 11/22/2015  . Diabetes mellitus, type II ([place]) 06/22/2014  . Protein-calorie malnutrition ([place]) 12/15/2013  . Fall 08/17/2013  . Back pain 08/17/2013  . Pressure ulcer, stage 1 07/06/2013  . Abnormal development of nail 03/28/2013  . Gait abnormality  12/10/2011  . Dementia 11/21/2011  . Hypertension 11/21/2011  . Hypothyroidism 11/21/2011    History reviewed. No pertinent surgical history.  OB History    No data available       Home Medications    Prior to Admission medications   Medication Sig Start Date End Date Taking? Authorizing Provider  aspirin 81 MG tablet Take 81 mg by mouth daily.    [provider]  Blood Glucose Monitoring Suppl (BLOOD GLUCOSE MONITOR SYSTEM) W/DEVICE KIT 1 Units by Does not apply route daily. 07/19/13   [name], MD  clopidogrel (PLAVIX) 75 MG tablet TAKE 1 TABLET BY MOUTH ONCE DAILY. 08/09/13   [name], MD  glimepiride (AMARYL) 2 MG tablet Take 1 tablet (2 mg total) by mouth daily with breakfast. 01/01/17   [name], MD  levothyroxine (SYNTHROID, LEVOTHROID) 50 MCG tablet TAKE 1 TABLET BY MOUTH ONCE DAILY. 08/09/13   [name], MD  linagliptin (TRADJENTA) 5 MG TABS tablet Take 1 tablet (5 mg total) by mouth daily. 09/04/16   [place], Modena Nunnery, MD  lisinopril (PRINIVIL,ZESTRIL) 40 MG tablet TAKE 1 TABLET BY MOUTH ONCE DAILY. 08/09/13   [name], MD  mirtazapine (REMERON) 7.5 MG tablet Take 1 tablet (7.5 mg total) by mouth at bedtime. For appetite 06/14/15   [place], Modena Nunnery, MD  Multiple Vitamin (DAILY VITE) TABS TAKE 1 TABLET BY MOUTH ONCE DAILY. 08/09/13   [name], MD  pantoprazole (PROTONIX) 40 MG tablet TAKE 1  TABLET BY MOUTH ONCE DAILY. 11/26/16   [name], MD  propranolol (INDERAL) 40 MG tablet TAKE 1 TABLET BY MOUTH TWICE DAILY. 08/09/13   [name], MD  traMADol (ULTRAM) 50 MG tablet TAKE 1 TABLET BY MOUTH EVERY 6 HOURS AS NEEDED FOR PAIN. 12/22/13   [name], MD    Family History Family History  Problem Relation Age of Onset  . Hypertension Mother   . Heart disease Mother   . Hypertension Father   . Heart disease Father   . Hypertension Sister   . Heart disease Sister   . Cancer Sister        BREAST    . Hypertension Brother   . Heart disease Brother     Social History Social History  Substance Use Topics  . Smoking status: Never Smoker  . Smokeless tobacco: Never Used  . Alcohol use No     Allergies   Patient has no known allergies.   Review of Systems Review of Systems  Unable to perform ROS: Mental status change   Physical Exam Updated Vital Signs BP (!) 159/63 (BP Location: Right Arm)   Pulse 85   Temp 98.4 F (36.9 C) (Oral)   Ht '5\' 4"'  (1.626 m)   Wt 123 lb (55.8 kg)   SpO2 97%   BMI 21.11 kg/m   Physical Exam  Constitutional: She appears well-developed.  HENT:  Head: Normocephalic.  Mouth/Throat: Mucous membranes are dry.  Eyes: Conjunctivae and EOM are normal. No scleral icterus.  Neck: Neck supple. No thyromegaly present.  Cardiovascular: Normal rate and regular rhythm.  Exam reveals no gallop and no friction rub.   No murmur heard. Pulmonary/Chest: No stridor. She has no wheezes. She has no rales. She exhibits no tenderness.  Abdominal: She exhibits no distension. There is no tenderness. There is no rebound.  Musculoskeletal: Normal range of motion. She exhibits no edema.  Lymphadenopathy:    She has no cervical adenopathy.  Neurological: She is alert. She exhibits normal muscle tone. Coordination normal.  Oriented to person and place.  Skin: No rash noted. No erythema.  Psychiatric: She has a normal mood and affect. Her behavior is normal.   ED Treatments / Results  Labs (all labs ordered are listed, but only abnormal results are displayed) Labs Reviewed  COMPREHENSIVE METABOLIC PANEL  CBC WITH DIFFERENTIAL/PLATELET  URINALYSIS, ROUTINE W REFLEX MICROSCOPIC    EKG  EKG Interpretation None       Radiology No results found.  Procedures Procedures (including critical care time)  DIAGNOSTIC STUDIES: Oxygen Saturation is 97% on RA, normal by my interpretation.    Medications Ordered in ED Medications  sodium chloride 0.9 % bolus  1,000 mL (not administered)     Initial Impression / Assessment and Plan / ED Course  I have reviewed the triage vital signs and the nursing notes.  Pertinent labs & imaging results that were available during my care of the patient were reviewed by me and considered in my medical decision making (see chart for details).     Patient with urinary tract infection. She will be treated with Keflex. CT head pending Final Clinical Impressions(s) / ED Diagnoses   Final diagnoses:  None    New Prescriptions New Prescriptions   No medications on file   The chart was scribed for me under my direct supervision.  I personally performed the history, physical, and medical decision making and all procedures in the evaluation of this patient.Marland Kitchen  Milton Ferguson, MD 01/17/17 1549

## 2017-01-17 NOTE — Telephone Encounter (Signed)
[name] from [facility] calling to see if something can be called in for her for uti, because she is having some behavioral issues and normally that happens .  [facility] [phone]

## 2017-01-17 NOTE — Telephone Encounter (Signed)
AGREE with taking her to ER to be evaluated if they cant bring her in

## 2017-01-17 NOTE — Discharge Instructions (Signed)
Drink plenty of fluids and follow up with your md next week °

## 2017-01-19 LAB — URINE CULTURE: Culture: NO GROWTH

## 2017-01-31 ENCOUNTER — Ambulatory Visit (INDEPENDENT_AMBULATORY_CARE_PROVIDER_SITE_OTHER): Payer: Medicare Other | Admitting: Family Medicine

## 2017-01-31 ENCOUNTER — Encounter: Payer: Self-pay | Admitting: Family Medicine

## 2017-01-31 VITALS — BP 128/70 | HR 68 | Temp 98.1°F | Resp 14 | Ht 64.0 in | Wt 125.0 lb

## 2017-01-31 DIAGNOSIS — D696 Thrombocytopenia, unspecified: Secondary | ICD-10-CM

## 2017-01-31 DIAGNOSIS — E038 Other specified hypothyroidism: Secondary | ICD-10-CM | POA: Diagnosis not present

## 2017-01-31 LAB — CBC WITH DIFFERENTIAL/PLATELET
BASOS ABS: 0 {cells}/uL (ref 0–200)
Basophils Relative: 0 %
EOS ABS: 198 {cells}/uL (ref 15–500)
EOS PCT: 3 %
HCT: 42.3 % (ref 35.0–45.0)
HEMOGLOBIN: 13 g/dL (ref 12.0–15.0)
Lymphocytes Relative: 26 %
Lymphs Abs: 1716 cells/uL (ref 850–3900)
MCH: 25.9 pg — AB (ref 27.0–33.0)
MCHC: 30.7 g/dL — AB (ref 32.0–36.0)
MCV: 84.3 fL (ref 80.0–100.0)
MONOS PCT: 8 %
Monocytes Absolute: 528 cells/uL (ref 200–950)
NEUTROS ABS: 4158 {cells}/uL (ref 1500–7800)
NEUTROS PCT: 63 %
PLATELETS: 134 10*3/uL — AB (ref 140–400)
RBC: 5.02 MIL/uL (ref 3.80–5.10)
RDW: 15.2 % — ABNORMAL HIGH (ref 11.0–15.0)
WBC: 6.6 10*3/uL (ref 3.8–10.8)

## 2017-01-31 LAB — TSH: TSH: 2.98 m[IU]/L

## 2017-01-31 LAB — T4, FREE: Free T4: 1.4 ng/dL (ref 0.8–1.8)

## 2017-01-31 LAB — T3, FREE: T3, Free: 2.4 pg/mL (ref 2.3–4.2)

## 2017-01-31 NOTE — Progress Notes (Signed)
   Subjective:    Patient ID: Kristin DineElsene Mast, female    DOB: [birth year], 76 y.o.   MRN: [id number]  Patient presents for ER F/U   Pt here to f/u ER visit, seen in ER due to altered mental status. Seen in ED on 5/25, CT head done was negative, UTI was working diagnosis prescribed Keflex - she had Cath specimen though culture came back negative,  Labs reviewed from 5/25  She has no concerns today. Her caregiver with her today says she has not had any falls or any other problems since the ER visit. She is also eating on a regular basis.  Review Of Systems:  GEN- denies fatigue, fever, weight loss,weakness, recent illness HEENT- denies eye drainage, change in vision, nasal discharge, CVS- denies chest pain, palpitations RESP- denies SOB, cough, wheeze ABD- denies N/V, change in stools, abd pain GU- denies dysuria, hematuria, dribbling, incontinence MSK- denies joint pain, muscle aches, injury Neuro- denies headache, dizziness, syncope, seizure activity       Objective:    BP 128/70   Pulse 68   Temp 98.1 F (36.7 C) (Oral)   Resp 14   Ht 5\' 4"  (1.626 m)   Wt 125 lb (56.7 kg)   SpO2 95%   BMI 21.46 kg/m  GEN- NAD, alert and oriented x3 HEENT- PERRL, EOMI, non injected sclera, pink conjunctiva, MMM, oropharynx clear Neck- Supple, no thyromegaly CVS- RRR, no murmur RESP-CTAB ABD-NABS,soft,NT,ND EXT- No edema Pulses- Radial, DP- 2+        Assessment & Plan:      Problem List Items Addressed This Visit    Hypothyroidism    Unknown cause of the acute AMS, no sign of True UTI, though she did complete the antibiotics her chest x-ray and her CT of head were unremarkable. She did have some mild thrombocytopenia which I will recheck today. Also go ahead and recheck her thyroid function studies were last checked in January to make sure this was not contributing. He is back at her baseline today name not had any further issues      Relevant Orders   TSH   T3, free   T4, free    Other Visit Diagnoses    Thrombocytopenia (HCC)    -  Primary   Relevant Orders   CBC with Differential/Platelet      Note: This dictation was prepared with Dragon dictation along with smaller phrase technology. Any transcriptional errors that result from this process are unintentional.

## 2017-01-31 NOTE — Patient Instructions (Addendum)
F/U as previous Sept  NO UTI noted, no further antibiotics We will call with her labs  Check CBG fasting and before bedtime

## 2017-01-31 NOTE — Assessment & Plan Note (Signed)
Unknown cause of the acute AMS, no sign of True UTI, though she did complete the antibiotics her chest x-ray and her CT of head were unremarkable. She did have some mild thrombocytopenia which I will recheck today. Also go ahead and recheck her thyroid function studies were last checked in January to make sure this was not contributing. He is back at her baseline today name not had any further issues

## 2017-02-03 ENCOUNTER — Encounter: Payer: Self-pay | Admitting: *Deleted

## 2017-03-03 DIAGNOSIS — M79675 Pain in left toe(s): Secondary | ICD-10-CM | POA: Diagnosis not present

## 2017-03-03 DIAGNOSIS — B351 Tinea unguium: Secondary | ICD-10-CM | POA: Diagnosis not present

## 2017-03-03 DIAGNOSIS — M79674 Pain in right toe(s): Secondary | ICD-10-CM | POA: Diagnosis not present

## 2017-05-05 ENCOUNTER — Ambulatory Visit: Payer: Medicare Other | Admitting: Family Medicine

## 2017-05-12 ENCOUNTER — Ambulatory Visit (INDEPENDENT_AMBULATORY_CARE_PROVIDER_SITE_OTHER): Payer: Medicare Other | Admitting: Family Medicine

## 2017-05-12 ENCOUNTER — Encounter: Payer: Self-pay | Admitting: Family Medicine

## 2017-05-12 VITALS — BP 132/76 | HR 80 | Temp 98.1°F | Resp 16 | Ht 64.0 in | Wt 128.4 lb

## 2017-05-12 DIAGNOSIS — Z794 Long term (current) use of insulin: Secondary | ICD-10-CM

## 2017-05-12 DIAGNOSIS — E440 Moderate protein-calorie malnutrition: Secondary | ICD-10-CM | POA: Diagnosis not present

## 2017-05-12 DIAGNOSIS — E119 Type 2 diabetes mellitus without complications: Secondary | ICD-10-CM | POA: Diagnosis not present

## 2017-05-12 DIAGNOSIS — F0150 Vascular dementia without behavioral disturbance: Secondary | ICD-10-CM | POA: Diagnosis not present

## 2017-05-12 DIAGNOSIS — I10 Essential (primary) hypertension: Secondary | ICD-10-CM | POA: Diagnosis not present

## 2017-05-12 MED ORDER — MIRTAZAPINE 15 MG PO TABS: 15.0000 mg | ORAL_TABLET | Freq: Every day | ORAL | 2 refills | Status: DC

## 2017-05-12 NOTE — Assessment & Plan Note (Signed)
Diabetes has been well-controlled. Not sure what she is eating at the facility may be causing blood sugars to elevate she denies having any sweets or KUBs and the caregiver today in the office states that she does not have anything in her room. We're treading a fine line as I want her to eat and keep her protein and weight up another sugars are fluctuating back up. I will check her A1c before actually changing her meds. Her goal of A1c less than 8% without any hypoglycemia symptoms.

## 2017-05-12 NOTE — Assessment & Plan Note (Signed)
Continue the Remeron.

## 2017-05-12 NOTE — Progress Notes (Signed)
   Subjective:    Patient ID: Kristin Grant, female    DOB: August 24, 1941, 76 y.o.   MRN: [id number]  Patient presents for Follow-up (is not fasting)   Pt here to f/u chronic medical problems    No acute concerns. There are no concerns from her facility noted. She's not had any falls. Her appetite is been good her weight is up 3 pounds. Her medications were reviewed as well as her adult to form.  Diabetes mellitus her blood sugars have been sporadic. Her morning sugars have ranged from 140-212 evening blood sugar 20 6311 no hypoglycemia episodes. Her last A1c in May was 6.9%. She's currently on glimiperide 2 mg daily as well as Tradjenta 5 mg daily.  Flu shot to be done at the facility   Podiatrist cuts nails at facility   Review Of Systems:  GEN- denies fatigue, fever, weight loss,weakness, recent illness HEENT- denies eye drainage, change in vision, nasal discharge, CVS- denies chest pain, palpitations RESP- denies SOB, cough, wheeze ABD- denies N/V, change in stools, abd pain GU- denies dysuria, hematuria, dribbling, incontinence MSK- denies joint pain, muscle aches, injury Neuro- denies headache, dizziness, syncope, seizure activity       Objective:    BP 132/76   Pulse 80   Temp 98.1 F (36.7 C) (Oral)   Resp 16   Ht  (1.626 m)   Wt 128 lb 6.4 oz (58.2 kg)   SpO2 99%   BMI 22.04 kg/m  GEN- NAD, alert and oriented x3 HEENT- PERRL, EOMI, non injected sclera, pink conjunctiva, MMM, oropharynx clear Neck- Supple, no thyromegaly CVS- RRR, no murmur RESP-CTAB ABD-NABS,soft,NT,ND EXT- No edema Pulses- Radial, DP- 2+        Assessment & Plan:      Problem List Items Addressed This Visit      Unprioritized   Protein-calorie malnutrition (HCC)    Continue the Remeron      Hypertension    Controlled no changes      Relevant Orders   CBC with Differential/Platelet   Comprehensive metabolic panel   Diabetes mellitus, type II (HCC)    Diabetes has been  well-controlled. Not sure what she is eating at the facility may be causing blood sugars to elevate she denies having any sweets or KUBs and the caregiver today in the office states that she does not have anything in her room. We're treading a fine line as I want her to eat and keep her protein and weight up another sugars are fluctuating back up. I will check her A1c before actually changing her meds. Her goal of A1c less than 8% without any hypoglycemia symptoms.      Relevant Orders   Hemoglobin A1c   Lipid panel   HM DIABETES FOOT EXAM (Completed)   Dementia - Primary    Overall she is doing well to facility they have not had any behavioral problems. Her appetite is also improved.      Relevant Medications   mirtazapine (REMERON) 15 MG tablet      Note: This dictation was prepared with Dragon dictation along with smaller phrase technology. Any transcriptional errors that result from this process are unintentional.

## 2017-05-12 NOTE — Patient Instructions (Signed)
F/U 4 months  

## 2017-05-12 NOTE — Assessment & Plan Note (Signed)
Overall she is doing well to facility they have not had any behavioral problems. Her appetite is also improved.

## 2017-05-12 NOTE — Assessment & Plan Note (Signed)
Controlled no changes 

## 2017-05-13 ENCOUNTER — Encounter: Payer: Self-pay | Admitting: *Deleted

## 2017-05-13 ENCOUNTER — Other Ambulatory Visit: Payer: Self-pay | Admitting: *Deleted

## 2017-05-13 LAB — CBC WITH DIFFERENTIAL/PLATELET
Basophils Absolute: 39 cells/uL (ref 0–200)
Basophils Relative: 0.5 %
Eosinophils Absolute: 277 cells/uL (ref 15–500)
Eosinophils Relative: 3.6 %
HCT: 41.5 % (ref 35.0–45.0)
Hemoglobin: 13.7 g/dL (ref 11.7–15.5)
Lymphs Abs: 1987 cells/uL (ref 850–3900)
MCH: 28.1 pg (ref 27.0–33.0)
MCHC: 33 g/dL (ref 32.0–36.0)
MCV: 85.2 fL (ref 80.0–100.0)
MPV: 13.7 fL — ABNORMAL HIGH (ref 7.5–12.5)
Monocytes Relative: 9.6 %
NEUTROS PCT: 60.5 %
Neutro Abs: 4659 cells/uL (ref 1500–7800)
PLATELETS: 131 10*3/uL — AB (ref 140–400)
RBC: 4.87 10*6/uL (ref 3.80–5.10)
RDW: 13.3 % (ref 11.0–15.0)
TOTAL LYMPHOCYTE: 25.8 %
WBC: 7.7 10*3/uL (ref 3.8–10.8)
WBCMIX: 739 {cells}/uL (ref 200–950)

## 2017-05-13 LAB — COMPREHENSIVE METABOLIC PANEL
AG Ratio: 1.9 (calc) (ref 1.0–2.5)
ALBUMIN MSPROF: 3.9 g/dL (ref 3.6–5.1)
ALKALINE PHOSPHATASE (APISO): 69 U/L (ref 33–130)
ALT: 9 U/L (ref 6–29)
AST: 9 U/L — ABNORMAL LOW (ref 10–35)
BUN: 16 mg/dL (ref 7–25)
CO2: 32 mmol/L (ref 20–32)
CREATININE: 0.88 mg/dL (ref 0.60–0.93)
Calcium: 9 mg/dL (ref 8.6–10.4)
Chloride: 103 mmol/L (ref 98–110)
Globulin: 2.1 g/dL (calc) (ref 1.9–3.7)
Glucose, Bld: 248 mg/dL — ABNORMAL HIGH (ref 65–99)
Potassium: 4.1 mmol/L (ref 3.5–5.3)
Sodium: 142 mmol/L (ref 135–146)
Total Bilirubin: 0.3 mg/dL (ref 0.2–1.2)
Total Protein: 6 g/dL — ABNORMAL LOW (ref 6.1–8.1)

## 2017-05-13 LAB — LIPID PANEL
CHOL/HDL RATIO: 3.3 (calc) (ref <5.0)
CHOLESTEROL: 130 mg/dL (ref <200)
HDL: 39 mg/dL — AB (ref >50)
LDL Cholesterol (Calc): 63 mg/dL (calc)
Non-HDL Cholesterol (Calc): 91 mg/dL (calc) (ref <130)
Triglycerides: 227 mg/dL — ABNORMAL HIGH (ref <150)

## 2017-05-13 LAB — HEMOGLOBIN A1C
HEMOGLOBIN A1C: 8.7 %{Hb} — AB (ref <5.7)
Mean Plasma Glucose: 203 (calc)
eAG (mmol/L): 11.2 (calc)

## 2017-05-13 MED ORDER — GLIMEPIRIDE 2 MG PO TABS: 2.0000 mg | ORAL_TABLET | Freq: Every day | ORAL | 2 refills | Status: DC

## 2017-05-13 MED ORDER — GLIMEPIRIDE 1 MG PO TABS: 1.0000 mg | ORAL_TABLET | Freq: Every day | ORAL | 1 refills | Status: DC

## 2017-05-19 DIAGNOSIS — M79675 Pain in left toe(s): Secondary | ICD-10-CM | POA: Diagnosis not present

## 2017-05-19 DIAGNOSIS — B351 Tinea unguium: Secondary | ICD-10-CM | POA: Diagnosis not present

## 2017-05-19 DIAGNOSIS — M79674 Pain in right toe(s): Secondary | ICD-10-CM | POA: Diagnosis not present

## 2017-06-17 ENCOUNTER — Other Ambulatory Visit: Payer: Self-pay | Admitting: *Deleted

## 2017-06-17 MED ORDER — INSULIN GLARGINE 100 UNIT/ML SOLOSTAR PEN: 5.0000 [IU] | PEN_INJECTOR | Freq: Every day | SUBCUTANEOUS | 99 refills | Status: DC

## 2017-06-20 DIAGNOSIS — Z23 Encounter for immunization: Secondary | ICD-10-CM | POA: Diagnosis not present

## 2017-06-30 ENCOUNTER — Telehealth: Payer: Self-pay | Admitting: *Deleted

## 2017-06-30 NOTE — Telephone Encounter (Signed)
Received fax from facility with patient CBG readings.  MD reviewed and recommendations are as follows: FSBS remain elevated.  Increase Lantus to 8u Q HS F/U in office in 1-2 weeks.   Call placed to facility and Kristin Grant, director made aware.   Appointment scheduled.

## 2017-07-10 DIAGNOSIS — E119 Type 2 diabetes mellitus without complications: Secondary | ICD-10-CM | POA: Diagnosis not present

## 2017-07-10 DIAGNOSIS — H25813 Combined forms of age-related cataract, bilateral: Secondary | ICD-10-CM | POA: Diagnosis not present

## 2017-07-10 DIAGNOSIS — Z7984 Long term (current) use of oral hypoglycemic drugs: Secondary | ICD-10-CM | POA: Diagnosis not present

## 2017-07-11 ENCOUNTER — Ambulatory Visit (INDEPENDENT_AMBULATORY_CARE_PROVIDER_SITE_OTHER): Payer: Medicare Other | Admitting: Family Medicine

## 2017-07-11 ENCOUNTER — Other Ambulatory Visit: Payer: Self-pay

## 2017-07-11 ENCOUNTER — Encounter: Payer: Self-pay | Admitting: Family Medicine

## 2017-07-11 ENCOUNTER — Telehealth: Payer: Self-pay | Admitting: Family Medicine

## 2017-07-11 VITALS — BP 130/62 | HR 88 | Temp 98.4°F | Resp 16 | Ht 64.0 in | Wt 128.0 lb

## 2017-07-11 DIAGNOSIS — R8290 Unspecified abnormal findings in urine: Secondary | ICD-10-CM | POA: Diagnosis not present

## 2017-07-11 DIAGNOSIS — N390 Urinary tract infection, site not specified: Secondary | ICD-10-CM

## 2017-07-11 DIAGNOSIS — B379 Candidiasis, unspecified: Secondary | ICD-10-CM | POA: Diagnosis not present

## 2017-07-11 DIAGNOSIS — M816 Localized osteoporosis [Lequesne]: Secondary | ICD-10-CM

## 2017-07-11 DIAGNOSIS — E119 Type 2 diabetes mellitus without complications: Secondary | ICD-10-CM | POA: Diagnosis not present

## 2017-07-11 DIAGNOSIS — Z794 Long term (current) use of insulin: Secondary | ICD-10-CM

## 2017-07-11 LAB — URINALYSIS, ROUTINE W REFLEX MICROSCOPIC
BACTERIA UA: NONE SEEN /HPF
Bilirubin Urine: NEGATIVE
Ketones, ur: NEGATIVE
Nitrite: NEGATIVE
SPECIFIC GRAVITY, URINE: 1.02 (ref 1.001–1.03)
pH: 6 (ref 5.0–8.0)

## 2017-07-11 LAB — BASIC METABOLIC PANEL
BUN: 17 mg/dL (ref 7–25)
CALCIUM: 9.3 mg/dL (ref 8.6–10.4)
CO2: 32 mmol/L (ref 20–32)
Chloride: 100 mmol/L (ref 98–110)
Creat: 0.88 mg/dL (ref 0.60–0.93)
Glucose, Bld: 298 mg/dL — ABNORMAL HIGH (ref 65–99)
POTASSIUM: 4.5 mmol/L (ref 3.5–5.3)
SODIUM: 140 mmol/L (ref 135–146)

## 2017-07-11 LAB — HEMOGLOBIN A1C, FINGERSTICK: Hgb A1C (fingerstick): 10.4 % OF TOTAL HGB — ABNORMAL HIGH (ref <6.0)

## 2017-07-11 LAB — MICROSCOPIC MESSAGE

## 2017-07-11 MED ORDER — CEPHALEXIN 250 MG PO CAPS: 250.0000 mg | ORAL_CAPSULE | Freq: Four times a day (QID) | ORAL | 0 refills | Status: DC

## 2017-07-11 MED ORDER — INSULIN GLARGINE 100 UNIT/ML SOLOSTAR PEN: 10.0000 [IU] | PEN_INJECTOR | Freq: Every day | SUBCUTANEOUS | 99 refills | Status: DC

## 2017-07-11 MED ORDER — FLUCONAZOLE 150 MG PO TABS: 150.0000 mg | ORAL_TABLET | Freq: Once | ORAL | 1 refills | Status: AC

## 2017-07-11 MED ORDER — DENOSUMAB 60 MG/ML ~~LOC~~ SOLN
60.0000 mg | Freq: Once | SUBCUTANEOUS | Status: AC
  Administered 2017-07-11: 60 mg via SUBCUTANEOUS

## 2017-07-11 NOTE — Addendum Note (Signed)
Addended by: Milinda AntisURHAM, Edvin Albus F on: 07/11/2017 04:47 PM   Modules accepted: Orders

## 2017-07-11 NOTE — Assessment & Plan Note (Signed)
Prolia injection given.

## 2017-07-11 NOTE — Patient Instructions (Addendum)
Increase lantus to 10 units Send me CBG readings in 2 weeks  We will call with BMET  F/U January as scheduled

## 2017-07-11 NOTE — Progress Notes (Signed)
   Subjective:    Patient ID: Kristin Grant, female    DOB: 19-Apr-1941, 76 y.o.   MRN: [id number]  Patient presents for Follow-up (DM) and Injections (Prolia)  Patient here to follow-up diabetes mellitus see if correspond is from her assisted living facility at her blood sugars have been from 200s up to almost 400.  Her A1c 2 months ago was 8.7% at that time her glimepiride was increased to3 mg she was already on Tradjenta.  Does not tolerate metformin. Increasing blood sugars I added Lantus 5 units at bedtime then increased to 8 units a couple weeks ago . CBG still range fasting 200-280, evening up to 390 States she has not been eating any sugar, or snacks Feels good   Is osteoporosis with pathological fracture that was treated earlier this year.  She is due for Prolia injection.  Renal function and liver function are preserved   Review Of Systems: no concerns noted from facility, she has dementia   GEN- denies fatigue, fever, weight loss,weakness, recent illness HEENT- denies eye drainage, change in vision, nasal discharge, CVS- denies chest pain, palpitations RESP- denies SOB, cough, wheeze ABD- denies N/V, change in stools, abd pain GU- denies dysuria, hematuria, dribbling, incontinence MSK- denies joint pain, muscle aches, injury Neuro- denies headache, dizziness, syncope, seizure activity       Objective:    BP 130/62   Pulse 88   Temp 98.4 F (36.9 C) (Oral)   Resp 16   Ht 5\' 4"  (1.626 m)   Wt 128 lb (58.1 kg)   SpO2 98%   BMI 21.97 kg/m  GEN- NAD, alert and oriented x3 HEENT- PERRL, EOMI, non injected sclera, pink conjunctiva, MMM, oropharynx clear CVS- RRR, no murmur RESP-CTAB ABD-NABS,soft,NT,ND EXT- No edema Pulses- Radial 2+        Assessment & Plan:      Problem List Items Addressed This Visit      Unprioritized   Osteoporosis    Prolia injection given      Relevant Medications   denosumab (PROLIA) injection 60 mg (Completed)   Diabetes  mellitus, type II (HCC) - Primary    A1C in office today elevated at 10.4% Increase lantus to 10 units Obtained urine concerned her blood sugars are elevated from underlying infection,noted to have 20-40 WBC in urine, 2+ leukocytes and yeast infection She is symptomatic otherwise  Given Keflex 250mg  QID x 5 days Diflucan       Relevant Orders   Hemoglobin A1C, fingerstick (Completed)   Basic metabolic panel   Urinalysis, Routine w reflex microscopic (Completed)   Urine Culture    Other Visit Diagnoses    Urinary tract infection without hematuria, site unspecified       Relevant Medications   cephALEXin (KEFLEX) 250 MG capsule   fluconazole (DIFLUCAN) 150 MG tablet   Other Relevant Orders   Urinalysis, Routine w reflex microscopic (Completed)   Urine Culture   Yeast infection       Relevant Medications   cephALEXin (KEFLEX) 250 MG capsule   fluconazole (DIFLUCAN) 150 MG tablet      Note: This dictation was prepared with Dragon dictation along with smaller phrase technology. Any transcriptional errors that result from this process are unintentional.

## 2017-07-11 NOTE — Telephone Encounter (Signed)
Dr Jeanice Limurham does want the patient to have Prolia injection.  Has appt tomorrow, will give to her at that time.

## 2017-07-11 NOTE — Telephone Encounter (Signed)
-----   Message from Salley ScarletKawanta F [place], MD sent at 07/07/2017  5:20 PM EST ----- Just reschedule for now,  ----- Message ----- From: Donne AnonPlummer, Kim M, LPN Sent: 16/10/960411/07/2017   3:37 PM To: Salley ScarletKawanta F [place], MD  Had last patient OV flagged to get Prolia but she did not.  Are you still wanting her to get??  She is not on any calcium or Vit D supplements and is malnourished?  Please advise if you want me to reschedule.

## 2017-07-11 NOTE — Assessment & Plan Note (Addendum)
A1C in office today elevated at 10.4% Increase lantus to 10 units Obtained urine concerned her blood sugars are elevated from underlying infection,noted to have 20-40 WBC in urine, 2+ leukocytes and yeast infection She is symptomatic otherwise  Given Keflex 250mg  QID x 5 days Diflucan

## 2017-07-12 LAB — MICROALBUMIN / CREATININE URINE RATIO
CREATININE, URINE: 67 mg/dL (ref 20–275)
MICROALB UR: 7.7 mg/dL
MICROALB/CREAT RATIO: 115 ug/mg{creat} — AB (ref <30)

## 2017-07-16 LAB — URINE CULTURE
MICRO NUMBER:: 81295579
SPECIMEN QUALITY: ADEQUATE

## 2017-07-23 ENCOUNTER — Other Ambulatory Visit: Payer: Self-pay | Admitting: *Deleted

## 2017-07-23 MED ORDER — INSULIN GLARGINE 100 UNIT/ML SOLOSTAR PEN: 12.0000 [IU] | PEN_INJECTOR | Freq: Every day | SUBCUTANEOUS | 99 refills | Status: DC

## 2017-07-25 ENCOUNTER — Other Ambulatory Visit: Payer: Self-pay | Admitting: Family Medicine

## 2017-07-28 DIAGNOSIS — B351 Tinea unguium: Secondary | ICD-10-CM | POA: Diagnosis not present

## 2017-07-28 DIAGNOSIS — M79675 Pain in left toe(s): Secondary | ICD-10-CM | POA: Diagnosis not present

## 2017-07-28 DIAGNOSIS — M79674 Pain in right toe(s): Secondary | ICD-10-CM | POA: Diagnosis not present

## 2017-08-12 DIAGNOSIS — Z7984 Long term (current) use of oral hypoglycemic drugs: Secondary | ICD-10-CM | POA: Diagnosis not present

## 2017-08-12 DIAGNOSIS — R2681 Unsteadiness on feet: Secondary | ICD-10-CM | POA: Diagnosis not present

## 2017-08-12 DIAGNOSIS — F0390 Unspecified dementia without behavioral disturbance: Secondary | ICD-10-CM | POA: Diagnosis not present

## 2017-08-12 DIAGNOSIS — I10 Essential (primary) hypertension: Secondary | ICD-10-CM | POA: Diagnosis not present

## 2017-08-12 DIAGNOSIS — E1165 Type 2 diabetes mellitus with hyperglycemia: Secondary | ICD-10-CM | POA: Diagnosis not present

## 2017-08-12 DIAGNOSIS — Z7982 Long term (current) use of aspirin: Secondary | ICD-10-CM | POA: Diagnosis not present

## 2017-08-14 DIAGNOSIS — Z7984 Long term (current) use of oral hypoglycemic drugs: Secondary | ICD-10-CM | POA: Diagnosis not present

## 2017-08-14 DIAGNOSIS — I10 Essential (primary) hypertension: Secondary | ICD-10-CM | POA: Diagnosis not present

## 2017-08-14 DIAGNOSIS — E1165 Type 2 diabetes mellitus with hyperglycemia: Secondary | ICD-10-CM | POA: Diagnosis not present

## 2017-08-14 DIAGNOSIS — R2681 Unsteadiness on feet: Secondary | ICD-10-CM | POA: Diagnosis not present

## 2017-08-14 DIAGNOSIS — Z7982 Long term (current) use of aspirin: Secondary | ICD-10-CM | POA: Diagnosis not present

## 2017-08-14 DIAGNOSIS — F0390 Unspecified dementia without behavioral disturbance: Secondary | ICD-10-CM | POA: Diagnosis not present

## 2017-08-18 DIAGNOSIS — Z7982 Long term (current) use of aspirin: Secondary | ICD-10-CM | POA: Diagnosis not present

## 2017-08-18 DIAGNOSIS — F0390 Unspecified dementia without behavioral disturbance: Secondary | ICD-10-CM | POA: Diagnosis not present

## 2017-08-18 DIAGNOSIS — E1165 Type 2 diabetes mellitus with hyperglycemia: Secondary | ICD-10-CM | POA: Diagnosis not present

## 2017-08-18 DIAGNOSIS — R2681 Unsteadiness on feet: Secondary | ICD-10-CM | POA: Diagnosis not present

## 2017-08-18 DIAGNOSIS — Z7984 Long term (current) use of oral hypoglycemic drugs: Secondary | ICD-10-CM | POA: Diagnosis not present

## 2017-08-18 DIAGNOSIS — I10 Essential (primary) hypertension: Secondary | ICD-10-CM | POA: Diagnosis not present

## 2017-08-20 DIAGNOSIS — R2681 Unsteadiness on feet: Secondary | ICD-10-CM | POA: Diagnosis not present

## 2017-08-20 DIAGNOSIS — I10 Essential (primary) hypertension: Secondary | ICD-10-CM | POA: Diagnosis not present

## 2017-08-20 DIAGNOSIS — Z7984 Long term (current) use of oral hypoglycemic drugs: Secondary | ICD-10-CM | POA: Diagnosis not present

## 2017-08-20 DIAGNOSIS — Z7982 Long term (current) use of aspirin: Secondary | ICD-10-CM | POA: Diagnosis not present

## 2017-08-20 DIAGNOSIS — F0390 Unspecified dementia without behavioral disturbance: Secondary | ICD-10-CM | POA: Diagnosis not present

## 2017-08-20 DIAGNOSIS — E1165 Type 2 diabetes mellitus with hyperglycemia: Secondary | ICD-10-CM | POA: Diagnosis not present

## 2017-08-22 DIAGNOSIS — Z7984 Long term (current) use of oral hypoglycemic drugs: Secondary | ICD-10-CM | POA: Diagnosis not present

## 2017-08-22 DIAGNOSIS — E1165 Type 2 diabetes mellitus with hyperglycemia: Secondary | ICD-10-CM | POA: Diagnosis not present

## 2017-08-22 DIAGNOSIS — I10 Essential (primary) hypertension: Secondary | ICD-10-CM | POA: Diagnosis not present

## 2017-08-22 DIAGNOSIS — F0390 Unspecified dementia without behavioral disturbance: Secondary | ICD-10-CM | POA: Diagnosis not present

## 2017-08-22 DIAGNOSIS — R2681 Unsteadiness on feet: Secondary | ICD-10-CM | POA: Diagnosis not present

## 2017-08-22 DIAGNOSIS — Z7982 Long term (current) use of aspirin: Secondary | ICD-10-CM | POA: Diagnosis not present

## 2017-08-25 DIAGNOSIS — Z7982 Long term (current) use of aspirin: Secondary | ICD-10-CM | POA: Diagnosis not present

## 2017-08-25 DIAGNOSIS — F0390 Unspecified dementia without behavioral disturbance: Secondary | ICD-10-CM | POA: Diagnosis not present

## 2017-08-25 DIAGNOSIS — I10 Essential (primary) hypertension: Secondary | ICD-10-CM | POA: Diagnosis not present

## 2017-08-25 DIAGNOSIS — E1165 Type 2 diabetes mellitus with hyperglycemia: Secondary | ICD-10-CM | POA: Diagnosis not present

## 2017-08-25 DIAGNOSIS — Z7984 Long term (current) use of oral hypoglycemic drugs: Secondary | ICD-10-CM | POA: Diagnosis not present

## 2017-08-25 DIAGNOSIS — R2681 Unsteadiness on feet: Secondary | ICD-10-CM | POA: Diagnosis not present

## 2017-08-28 DIAGNOSIS — F0390 Unspecified dementia without behavioral disturbance: Secondary | ICD-10-CM | POA: Diagnosis not present

## 2017-08-28 DIAGNOSIS — R2681 Unsteadiness on feet: Secondary | ICD-10-CM | POA: Diagnosis not present

## 2017-08-28 DIAGNOSIS — E1165 Type 2 diabetes mellitus with hyperglycemia: Secondary | ICD-10-CM | POA: Diagnosis not present

## 2017-08-28 DIAGNOSIS — Z7982 Long term (current) use of aspirin: Secondary | ICD-10-CM | POA: Diagnosis not present

## 2017-08-28 DIAGNOSIS — Z7984 Long term (current) use of oral hypoglycemic drugs: Secondary | ICD-10-CM | POA: Diagnosis not present

## 2017-08-28 DIAGNOSIS — I10 Essential (primary) hypertension: Secondary | ICD-10-CM | POA: Diagnosis not present

## 2017-09-01 DIAGNOSIS — F0390 Unspecified dementia without behavioral disturbance: Secondary | ICD-10-CM | POA: Diagnosis not present

## 2017-09-01 DIAGNOSIS — Z7982 Long term (current) use of aspirin: Secondary | ICD-10-CM | POA: Diagnosis not present

## 2017-09-01 DIAGNOSIS — E1165 Type 2 diabetes mellitus with hyperglycemia: Secondary | ICD-10-CM | POA: Diagnosis not present

## 2017-09-01 DIAGNOSIS — Z7984 Long term (current) use of oral hypoglycemic drugs: Secondary | ICD-10-CM | POA: Diagnosis not present

## 2017-09-01 DIAGNOSIS — R2681 Unsteadiness on feet: Secondary | ICD-10-CM | POA: Diagnosis not present

## 2017-09-01 DIAGNOSIS — I10 Essential (primary) hypertension: Secondary | ICD-10-CM | POA: Diagnosis not present

## 2017-09-03 DIAGNOSIS — Z7984 Long term (current) use of oral hypoglycemic drugs: Secondary | ICD-10-CM | POA: Diagnosis not present

## 2017-09-03 DIAGNOSIS — E1165 Type 2 diabetes mellitus with hyperglycemia: Secondary | ICD-10-CM | POA: Diagnosis not present

## 2017-09-03 DIAGNOSIS — F0390 Unspecified dementia without behavioral disturbance: Secondary | ICD-10-CM | POA: Diagnosis not present

## 2017-09-03 DIAGNOSIS — Z7982 Long term (current) use of aspirin: Secondary | ICD-10-CM | POA: Diagnosis not present

## 2017-09-03 DIAGNOSIS — R2681 Unsteadiness on feet: Secondary | ICD-10-CM | POA: Diagnosis not present

## 2017-09-03 DIAGNOSIS — I10 Essential (primary) hypertension: Secondary | ICD-10-CM | POA: Diagnosis not present

## 2017-09-05 DIAGNOSIS — Z7982 Long term (current) use of aspirin: Secondary | ICD-10-CM | POA: Diagnosis not present

## 2017-09-05 DIAGNOSIS — R2681 Unsteadiness on feet: Secondary | ICD-10-CM | POA: Diagnosis not present

## 2017-09-05 DIAGNOSIS — E1165 Type 2 diabetes mellitus with hyperglycemia: Secondary | ICD-10-CM | POA: Diagnosis not present

## 2017-09-05 DIAGNOSIS — Z7984 Long term (current) use of oral hypoglycemic drugs: Secondary | ICD-10-CM | POA: Diagnosis not present

## 2017-09-05 DIAGNOSIS — F0390 Unspecified dementia without behavioral disturbance: Secondary | ICD-10-CM | POA: Diagnosis not present

## 2017-09-05 DIAGNOSIS — I10 Essential (primary) hypertension: Secondary | ICD-10-CM | POA: Diagnosis not present

## 2017-09-08 DIAGNOSIS — Z7984 Long term (current) use of oral hypoglycemic drugs: Secondary | ICD-10-CM | POA: Diagnosis not present

## 2017-09-08 DIAGNOSIS — F0390 Unspecified dementia without behavioral disturbance: Secondary | ICD-10-CM | POA: Diagnosis not present

## 2017-09-08 DIAGNOSIS — R2681 Unsteadiness on feet: Secondary | ICD-10-CM | POA: Diagnosis not present

## 2017-09-08 DIAGNOSIS — I10 Essential (primary) hypertension: Secondary | ICD-10-CM | POA: Diagnosis not present

## 2017-09-08 DIAGNOSIS — Z7982 Long term (current) use of aspirin: Secondary | ICD-10-CM | POA: Diagnosis not present

## 2017-09-08 DIAGNOSIS — E1165 Type 2 diabetes mellitus with hyperglycemia: Secondary | ICD-10-CM | POA: Diagnosis not present

## 2017-09-10 ENCOUNTER — Other Ambulatory Visit: Payer: Self-pay | Admitting: Family Medicine

## 2017-09-10 DIAGNOSIS — E1165 Type 2 diabetes mellitus with hyperglycemia: Secondary | ICD-10-CM | POA: Diagnosis not present

## 2017-09-10 DIAGNOSIS — Z7984 Long term (current) use of oral hypoglycemic drugs: Secondary | ICD-10-CM | POA: Diagnosis not present

## 2017-09-10 DIAGNOSIS — F0390 Unspecified dementia without behavioral disturbance: Secondary | ICD-10-CM | POA: Diagnosis not present

## 2017-09-10 DIAGNOSIS — I10 Essential (primary) hypertension: Secondary | ICD-10-CM | POA: Diagnosis not present

## 2017-09-10 DIAGNOSIS — Z7982 Long term (current) use of aspirin: Secondary | ICD-10-CM | POA: Diagnosis not present

## 2017-09-10 DIAGNOSIS — R2681 Unsteadiness on feet: Secondary | ICD-10-CM | POA: Diagnosis not present

## 2017-09-12 ENCOUNTER — Ambulatory Visit: Payer: Medicare Other | Admitting: Family Medicine

## 2017-09-15 DIAGNOSIS — Z7984 Long term (current) use of oral hypoglycemic drugs: Secondary | ICD-10-CM | POA: Diagnosis not present

## 2017-09-15 DIAGNOSIS — R2681 Unsteadiness on feet: Secondary | ICD-10-CM | POA: Diagnosis not present

## 2017-09-15 DIAGNOSIS — E1165 Type 2 diabetes mellitus with hyperglycemia: Secondary | ICD-10-CM | POA: Diagnosis not present

## 2017-09-15 DIAGNOSIS — F0390 Unspecified dementia without behavioral disturbance: Secondary | ICD-10-CM | POA: Diagnosis not present

## 2017-09-15 DIAGNOSIS — I10 Essential (primary) hypertension: Secondary | ICD-10-CM | POA: Diagnosis not present

## 2017-09-15 DIAGNOSIS — Z7982 Long term (current) use of aspirin: Secondary | ICD-10-CM | POA: Diagnosis not present

## 2017-09-17 DIAGNOSIS — F0390 Unspecified dementia without behavioral disturbance: Secondary | ICD-10-CM | POA: Diagnosis not present

## 2017-09-17 DIAGNOSIS — R2681 Unsteadiness on feet: Secondary | ICD-10-CM | POA: Diagnosis not present

## 2017-09-17 DIAGNOSIS — Z7984 Long term (current) use of oral hypoglycemic drugs: Secondary | ICD-10-CM | POA: Diagnosis not present

## 2017-09-17 DIAGNOSIS — Z7982 Long term (current) use of aspirin: Secondary | ICD-10-CM | POA: Diagnosis not present

## 2017-09-17 DIAGNOSIS — E1165 Type 2 diabetes mellitus with hyperglycemia: Secondary | ICD-10-CM | POA: Diagnosis not present

## 2017-09-17 DIAGNOSIS — I10 Essential (primary) hypertension: Secondary | ICD-10-CM | POA: Diagnosis not present

## 2017-09-18 ENCOUNTER — Emergency Department (HOSPITAL_COMMUNITY): Payer: Medicare Other

## 2017-09-18 ENCOUNTER — Encounter (HOSPITAL_COMMUNITY): Payer: Self-pay | Admitting: Emergency Medicine

## 2017-09-18 ENCOUNTER — Other Ambulatory Visit: Payer: Self-pay

## 2017-09-18 ENCOUNTER — Emergency Department (HOSPITAL_COMMUNITY)
Admission: EM | Admit: 2017-09-18 | Discharge: 2017-09-18 | Disposition: A | Discharge status: Home / Self Care | Payer: Medicare Other | Attending: Emergency Medicine | Admitting: Emergency Medicine

## 2017-09-18 DIAGNOSIS — S0003XA Contusion of scalp, initial encounter: Secondary | ICD-10-CM | POA: Diagnosis not present

## 2017-09-18 DIAGNOSIS — Y92122 Bedroom in nursing home as the place of occurrence of the external cause: Secondary | ICD-10-CM | POA: Diagnosis not present

## 2017-09-18 DIAGNOSIS — Z794 Long term (current) use of insulin: Secondary | ICD-10-CM | POA: Insufficient documentation

## 2017-09-18 DIAGNOSIS — E119 Type 2 diabetes mellitus without complications: Secondary | ICD-10-CM | POA: Diagnosis not present

## 2017-09-18 DIAGNOSIS — G309 Alzheimer's disease, unspecified: Secondary | ICD-10-CM | POA: Diagnosis not present

## 2017-09-18 DIAGNOSIS — S0083XA Contusion of other part of head, initial encounter: Secondary | ICD-10-CM | POA: Diagnosis not present

## 2017-09-18 DIAGNOSIS — Z7982 Long term (current) use of aspirin: Secondary | ICD-10-CM | POA: Insufficient documentation

## 2017-09-18 DIAGNOSIS — Y999 Unspecified external cause status: Secondary | ICD-10-CM | POA: Insufficient documentation

## 2017-09-18 DIAGNOSIS — Y9389 Activity, other specified: Secondary | ICD-10-CM | POA: Insufficient documentation

## 2017-09-18 DIAGNOSIS — I10 Essential (primary) hypertension: Secondary | ICD-10-CM | POA: Diagnosis not present

## 2017-09-18 DIAGNOSIS — S42301A Unspecified fracture of shaft of humerus, right arm, initial encounter for closed fracture: Secondary | ICD-10-CM | POA: Diagnosis not present

## 2017-09-18 DIAGNOSIS — I259 Chronic ischemic heart disease, unspecified: Secondary | ICD-10-CM | POA: Insufficient documentation

## 2017-09-18 DIAGNOSIS — S0990XA Unspecified injury of head, initial encounter: Secondary | ICD-10-CM | POA: Diagnosis present

## 2017-09-18 DIAGNOSIS — S42201A Unspecified fracture of upper end of right humerus, initial encounter for closed fracture: Secondary | ICD-10-CM | POA: Insufficient documentation

## 2017-09-18 DIAGNOSIS — W1830XA Fall on same level, unspecified, initial encounter: Secondary | ICD-10-CM | POA: Diagnosis not present

## 2017-09-18 DIAGNOSIS — E039 Hypothyroidism, unspecified: Secondary | ICD-10-CM | POA: Diagnosis not present

## 2017-09-18 DIAGNOSIS — Z7902 Long term (current) use of antithrombotics/antiplatelets: Secondary | ICD-10-CM | POA: Insufficient documentation

## 2017-09-18 NOTE — ED Notes (Signed)
Pt with hematoma to right forehead, Unable to abduct right shoulder and bruising to right elbow

## 2017-09-18 NOTE — ED Provider Notes (Signed)
[facility] EMERGENCY DEPARTMENT Provider Note   CSN: [id number] Arrival date & time: 09/18/17  0932     History   Chief Complaint Chief Complaint  Patient presents with  . Shoulder Injury  . Fall    HPI Cindi Ghazarian is a 77 y.o. female.  HPI  Patient states she was bending over to put on her shoes and she fell.  Struck her head and right shoulder. Currently denying any pain.  No focal weakness or numbness.  States she is having difficulty moving her right shoulder.  Patient is on Plavix.   Past Medical History:  Diagnosis Date  . Alzheimer disease   . Arthritis   . Coronary artery disease   . Diabetes mellitus   . Difficulty walking   . Hypertension   . Reflux   . Stroke [facility])     Patient Active Problem List   Diagnosis Date Noted  . Osteoporosis 01/09/2017  . Loss of weight 01/12/2016  . CVA (cerebral vascular accident) ([place]) 11/22/2015  . Diabetes mellitus, type II ([place]) 06/22/2014  . Protein-calorie malnutrition ([place]) 12/15/2013  . Fall 08/17/2013  . Back pain 08/17/2013  . Pressure ulcer, stage 1 07/06/2013  . Abnormal development of nail 03/28/2013  . Gait abnormality 12/10/2011  . Dementia 11/21/2011  . Hypertension 11/21/2011  . Hypothyroidism 11/21/2011    Past Surgical History:  Procedure Laterality Date  . APPENDECTOMY    . FINGER DEBRIDEMENT      OB History    No data available       Home Medications    Prior to Admission medications   Medication Sig Start Date End Date Taking? Authorizing Provider  acetaminophen (TYLENOL) 500 MG tablet Take 500 mg by mouth every 6 (six) hours as needed for moderate pain or fever.    [provider]  aspirin 81 MG tablet Take 81 mg by mouth daily.    [provider]  Blood Glucose Monitoring Suppl (BLOOD GLUCOSE MONITOR SYSTEM) W/DEVICE KIT 1 Units by Does not apply route daily. 07/19/13   [place], Modena Nunnery, MD  cephALEXin (KEFLEX) 250 MG capsule Take 1 capsule (250 mg total) 4  (four) times daily by mouth. For 5 days 07/11/17   [name], MD  clopidogrel (PLAVIX) 75 MG tablet TAKE 1 TABLET BY MOUTH ONCE DAILY. 08/09/13   [name], MD  glimepiride (AMARYL) 1 MG tablet Take 1 tablet (1 mg total) by mouth daily with breakfast. Take with Glimepiride 8m to total 335m 05/13/17   [place], KaModena NunneryMD  glimepiride (AMARYL) 2 MG tablet Take 1 tablet (2 mg total) by mouth daily with breakfast. Take with Glimepiride 1m38mo total 3mg89m/18/18   DurhAlycia Rossetti  LANTUS SOLOSTAR 100 UNIT/ML Solostar Pen INJECT 12 UNITS SUBCUTANEOUSLY AT BEDTIME. 07/25/17   DurhAlycia Rossetti  levothyroxine (SYNTHROID, LEVOTHROID) 50 MCG tablet TAKE 1 TABLET BY MOUTH ONCE DAILY. 08/09/13   DurhAlycia Rossetti  linagliptin (TRADJENTA) 5 MG TABS tablet Take 1 tablet (5 mg total) by mouth daily. 09/04/16   [place], KawaModena Nunnery  lisinopril (PRINIVIL,ZESTRIL) 40 MG tablet TAKE 1 TABLET BY MOUTH ONCE DAILY. 08/09/13   DurhAlycia Rossetti  mirtazapine (REMERON) 15 MG tablet Take 1 tablet (15 mg total) by mouth at bedtime. For appetite 05/12/17   [place], KawaModena Nunnery  Multiple Vitamin (DAILY VITE) TABS TAKE 1 TABLET BY MOUTH ONCE DAILY. 08/09/13   DurhAlycia Rossetti  NOVOFINE  AUTOCOVER 30G X 8 MM MISC USE WITH INSULIN FOR ADMINISTRATION. 09/10/17   [name], MD  Nutritional Supplements (NUTRITIONAL SUPPLEMENT PO) Take by mouth.    [provider]  pantoprazole (PROTONIX) 40 MG tablet TAKE 1 TABLET BY MOUTH ONCE DAILY. 11/26/16   [name], MD  propranolol (INDERAL) 40 MG tablet TAKE 1 TABLET BY MOUTH TWICE DAILY. 08/09/13   [place], Modena Nunnery, MD  pseudoephedrine-dextromethorphan-guaifenesin (ROBITUSSIN-PE) 30-10-100 MG/5ML solution Take 5 mLs by mouth every 4 (four) hours as needed for cough.    [provider]  traMADol (ULTRAM) 50 MG tablet TAKE 1 TABLET BY MOUTH EVERY 6 HOURS AS NEEDED FOR PAIN. Patient taking differently: TAKE 1 TABLET BY MOUTH  EVERY 12 HOURS 12/22/13   [name], MD    Family History Family History  Problem Relation Age of Onset  . Hypertension Mother   . Heart disease Mother   . Hypertension Father   . Heart disease Father   . Hypertension Sister   . Heart disease Sister   . Cancer Sister        BREAST   . Hypertension Brother   . Heart disease Brother     Social History Social History   Tobacco Use  . Smoking status: Never Smoker  . Smokeless tobacco: Never Used  Substance Use Topics  . Alcohol use: No  . Drug use: No     Allergies   Patient has no known allergies.   Review of Systems Review of Systems  Constitutional: Negative for chills and fever.  HENT: Positive for facial swelling.   Eyes: Negative for visual disturbance.  Respiratory: Negative for shortness of breath.   Cardiovascular: Negative for chest pain.  Gastrointestinal: Negative for abdominal pain, nausea and vomiting.  Musculoskeletal: Positive for joint swelling. Negative for arthralgias, myalgias and neck pain.  Skin: Positive for wound. Negative for rash.  Neurological: Negative for dizziness, syncope, weakness, light-headedness, numbness and headaches.  All other systems reviewed and are negative.    Physical Exam Updated Vital Signs BP 131/81   Pulse 81   Temp 98.1 F (36.7 C) (Oral)   Resp 16   Ht '5\' 1"'  (1.549 m)   Wt 58.1 kg (128 lb)   SpO2 98%   BMI 24.19 kg/m   Physical Exam  Constitutional: She is oriented to person, place, and time. She appears well-developed and well-nourished. No distress.  HENT:  Head: Normocephalic.  Mouth/Throat: Oropharynx is clear and moist.  Patient has a right lateral eyebrow hematoma.  Midface is stable.  No malocclusion.  No intraoral trauma.  Eyes: EOM are normal. Pupils are equal, round, and reactive to light.  Neck: Normal range of motion. Neck supple.  No posterior midline cervical tenderness to palpation.  Cardiovascular: Normal rate and regular  rhythm. Exam reveals no gallop and no friction rub.  No murmur heard. Pulmonary/Chest: Effort normal and breath sounds normal. No stridor. No respiratory distress. She has no wheezes. She has no rales. She exhibits no tenderness.  Abdominal: Soft. Bowel sounds are normal. There is no tenderness. There is no rebound and no guarding.  Musculoskeletal: She exhibits deformity. She exhibits no edema or tenderness.  Right shoulder is enlarged.  Decreased range of motion.  No definite tenderness to palpation.  Full range of motion of the right elbow without deformity or pain.  Distal pulses are 2+.  Neurological: She is alert and oriented to person, place, and time.  Moving all extremities without deficit  other than decreased movement of the right shoulder presumably due to injury.  Sensation intact.  Skin: Skin is warm and dry. Capillary refill takes less than 2 seconds. No rash noted. She is not diaphoretic. No erythema.  Psychiatric: She has a normal mood and affect. Her behavior is normal.  Nursing note and vitals reviewed.    ED Treatments / Results  Labs (all labs ordered are listed, but only abnormal results are displayed) Labs Reviewed - No data to display  EKG  EKG Interpretation None       Radiology Dg Shoulder Right  Result Date: 09/18/2017 CLINICAL DATA:  Fall.  Right shoulder injury. EXAM: RIGHT SHOULDER - 2+ VIEW COMPARISON:  Chest x-ray 01/17/2017. FINDINGS: Comminuted fracture of the right humeral head is noted. Fracture fragments are only slightly displaced. Acromioclavicular and glenohumeral degenerative change. Small loose bodies within the glenohumeral joint cannot be excluded. The diffuse osteopenia. IMPRESSION: 1. Comminuted fracture of the right humeral head noted. Slight displacement. 2. Diffuse osteopenia and degenerative change. Small loose bodies within the glenohumeral joint cannot be excluded. Electronically Signed   By: Marcello Moores  Register   On: 09/18/2017 10:47    Ct Head Wo Contrast  Result Date: 09/18/2017 CLINICAL DATA:  Head trauma, minor, on anticoagulation. Initial encounter. EXAM: CT HEAD WITHOUT CONTRAST TECHNIQUE: Contiguous axial images were obtained from the base of the skull through the vertex without intravenous contrast. COMPARISON:  01/17/2017 FINDINGS: Brain: No evidence of acute infarction, hemorrhage, hydrocephalus, extra-axial collection or mass lesion/mass effect. Atrophy and chronic small vessel ischemia with extensive cerebral white matter low-density. Vascular: Atherosclerotic calcification. Skull: Contusion of along the lateral right forehead. Negative for fracture. There is a fatty but mildly hazy mass in the right parietal scalp measuring up to 2.6 cm that shows variable dimensions/shape and could be a lipoma or dermoid. There has been no significant growth when compared to 2014. Sinuses/Orbits: No evidence of injury IMPRESSION: 1. No evidence of intracranial injury. 2. Right forehead contusion without fracture. 3. Atrophy and chronic small vessel ischemia that is stable from 2018. Electronically Signed   By: Monte Fantasia M.D.   On: 09/18/2017 11:11    Procedures Procedures (including critical care time)  Medications Ordered in ED Medications - No data to display   Initial Impression / Assessment and Plan / ED Course  I have reviewed the triage vital signs and the nursing notes.  Pertinent labs & imaging results that were available during my care of the patient were reviewed by me and considered in my medical decision making (see chart for details).    Placed in shoulder immobilizer.  With orthopedics.  Head injury precautions given.  Final Clinical Impressions(s) / ED Diagnoses   Final diagnoses:  Contusion of face, initial encounter  Closed fracture of proximal end of right humerus, unspecified fracture morphology, initial encounter    ED Discharge Orders    None       Julianne Rice, MD 09/18/17 1527

## 2017-09-18 NOTE — ED Triage Notes (Addendum)
PT brought in today [facility] with staff and reports fall at her bedside while trying to put on her shoes. PT fell onto left side and obtained hematoma to right forehead and right shoulder injury with decreased ROM and possible deformity noted. PT is on Plavix as well.

## 2017-09-18 NOTE — ED Notes (Signed)
Pt awaiting transportation to take her back to nursing home

## 2017-09-18 NOTE — ED Notes (Signed)
Pt returned from xray

## 2017-09-19 ENCOUNTER — Encounter: Payer: Self-pay | Admitting: Orthopedic Surgery

## 2017-09-22 ENCOUNTER — Encounter: Payer: Self-pay | Admitting: Orthopedic Surgery

## 2017-09-22 ENCOUNTER — Ambulatory Visit (INDEPENDENT_AMBULATORY_CARE_PROVIDER_SITE_OTHER): Payer: Medicare Other | Admitting: Orthopedic Surgery

## 2017-09-22 VITALS — BP 161/115 | HR 63 | Ht 64.0 in | Wt 127.0 lb

## 2017-09-22 DIAGNOSIS — S42294A Other nondisplaced fracture of upper end of right humerus, initial encounter for closed fracture: Secondary | ICD-10-CM | POA: Diagnosis not present

## 2017-09-22 NOTE — Progress Notes (Signed)
NEW PATIENT OFFICE VISIT    Chief Complaint  Patient presents with  . Shoulder Pain    right shoulder fracture 09/18/17 date of injury     77 year old female fell and injured her right shoulder she presents for evaluation  Date of injury was September 18, 2017.  She complains of dull constant mild pain over the right upper humerus painful attempts at range of motion with decreased range of motion    Review of Systems  Constitutional: Negative for fever.  Skin: Negative.   Neurological: Negative for tingling.     Past Medical History:  Diagnosis Date  . Alzheimer disease   . Arthritis   . Coronary artery disease   . Diabetes mellitus   . Difficulty walking   . Hypertension   . Reflux   . Stroke [facility])     Past Surgical History:  Procedure Laterality Date  . APPENDECTOMY    . FINGER DEBRIDEMENT      Family History  Problem Relation Age of Onset  . Hypertension Mother   . Heart disease Mother   . Hypertension Father   . Heart disease Father   . Hypertension Sister   . Heart disease Sister   . Cancer Sister        BREAST   . Hypertension Brother   . Heart disease Brother    Social History   Tobacco Use  . Smoking status: Never Smoker  . Smokeless tobacco: Never Used  Substance Use Topics  . Alcohol use: No  . Drug use: No     No Known Allergies  Current Meds  Medication Sig  . acetaminophen (TYLENOL) 500 MG tablet Take 500 mg by mouth every 6 (six) hours as needed for moderate pain or fever.  Marland Kitchen aspirin 81 MG tablet Take 81 mg by mouth daily.  . Blood Glucose Monitoring Suppl (BLOOD GLUCOSE MONITOR SYSTEM) W/DEVICE KIT 1 Units by Does not apply route daily.  . clopidogrel (PLAVIX) 75 MG tablet TAKE 1 TABLET BY MOUTH ONCE DAILY.  Marland Kitchen glimepiride (AMARYL) 1 MG tablet Take 1 tablet (1 mg total) by mouth daily with breakfast. Take with Glimepiride 30m to total 366m  . Marland Kitchenlimepiride (AMARYL) 2 MG tablet Take 1 tablet (2 mg total) by mouth daily with breakfast.  Take with Glimepiride 40m74mo total 3mg29m. LAMarland KitchenTUS SOLOSTAR 100 UNIT/ML Solostar Pen INJECT 12 UNITS SUBCUTANEOUSLY AT BEDTIME.  . leMarland Kitchenothyroxine (SYNTHROID, LEVOTHROID) 50 MCG tablet TAKE 1 TABLET BY MOUTH ONCE DAILY.  . liMarland Kitchenagliptin (TRADJENTA) 5 MG TABS tablet Take 1 tablet (5 mg total) by mouth daily.  . liMarland Kitcheninopril (PRINIVIL,ZESTRIL) 40 MG tablet TAKE 1 TABLET BY MOUTH ONCE DAILY.  . mirtazapine (REMERON) 15 MG tablet Take 1 tablet (15 mg total) by mouth at bedtime. For appetite  . Multiple Vitamin (DAILY VITE) TABS TAKE 1 TABLET BY MOUTH ONCE DAILY.  . NOMarland KitchenOFINE AUTOCOVER 30G X 8 MM MISC USE WITH INSULIN FOR ADMINISTRATION.  . NuMarland Kitchenritional Supplements (NUTRITIONAL SUPPLEMENT PO) Take by mouth.  . pantoprazole (PROTONIX) 40 MG tablet TAKE 1 TABLET BY MOUTH ONCE DAILY.  . prMarland Kitchenpranolol (INDERAL) 40 MG tablet TAKE 1 TABLET BY MOUTH TWICE DAILY.  . psMarland Kitchenudoephedrine-dextromethorphan-guaifenesin (ROBITUSSIN-PE) 30-10-100 MG/5ML solution Take 5 mLs by mouth every 4 (four) hours as needed for cough.  . traMADol (ULTRAM) 50 MG tablet TAKE 1 TABLET BY MOUTH EVERY 6 HOURS AS NEEDED FOR PAIN. (Patient taking differently: TAKE 1 TABLET BY MOUTH EVERY 12 HOURS)    BP (!) 161/115  Pulse 63   Ht '5\' 4"'  (1.626 m)   Wt 127 lb (57.6 kg)   BMI 21.80 kg/m   Physical Exam  Constitutional: She is oriented to person, place, and time. She appears well-developed and well-nourished.  Neurological: She is alert and oriented to person, place, and time.  Skin: Capillary refill takes less than 2 seconds.  Psychiatric: She has a normal mood and affect.  Vitals reviewed.   Ortho Exam   Right upper extremity tender proximal humerus skin shows ecchymosis proximal humerus and lower humerus normal range of motion hand and wrist painful range of motion elbow and shoulder no instability of the elbow or wrist muscle tone normal pulse and temperature normal no lymphadenopathy axilla or supraclavicular region sensation is  normal    Encounter Diagnosis  Name Primary?  . Other closed nondisplaced fracture of proximal end of right humerus, initial encounter Yes     PLAN:   X-ray AP lateral right shoulder I read as shows nondisplaced proximal humerus fracture which is comminuted  Recommend sling and   X-ray 3 weeks then start therapy

## 2017-09-24 ENCOUNTER — Other Ambulatory Visit: Payer: Self-pay | Admitting: *Deleted

## 2017-09-24 MED ORDER — GLIMEPIRIDE 1 MG PO TABS: 1.0000 mg | ORAL_TABLET | Freq: Every day | ORAL | 1 refills | Status: DC

## 2017-09-24 MED ORDER — GLIMEPIRIDE 2 MG PO TABS: 2.0000 mg | ORAL_TABLET | Freq: Every day | ORAL | 2 refills | Status: DC

## 2017-10-10 ENCOUNTER — Other Ambulatory Visit: Payer: Self-pay | Admitting: Family Medicine

## 2017-10-14 DIAGNOSIS — B351 Tinea unguium: Secondary | ICD-10-CM | POA: Diagnosis not present

## 2017-10-14 DIAGNOSIS — M79675 Pain in left toe(s): Secondary | ICD-10-CM | POA: Diagnosis not present

## 2017-10-14 DIAGNOSIS — M79674 Pain in right toe(s): Secondary | ICD-10-CM | POA: Diagnosis not present

## 2017-10-16 DIAGNOSIS — S42201A Unspecified fracture of upper end of right humerus, initial encounter for closed fracture: Secondary | ICD-10-CM | POA: Insufficient documentation

## 2017-10-17 ENCOUNTER — Ambulatory Visit (INDEPENDENT_AMBULATORY_CARE_PROVIDER_SITE_OTHER): Payer: Medicare Other

## 2017-10-17 ENCOUNTER — Ambulatory Visit (INDEPENDENT_AMBULATORY_CARE_PROVIDER_SITE_OTHER): Payer: Medicare Other | Admitting: Orthopedic Surgery

## 2017-10-17 ENCOUNTER — Encounter: Payer: Self-pay | Admitting: Orthopedic Surgery

## 2017-10-17 DIAGNOSIS — S42294D Other nondisplaced fracture of upper end of right humerus, subsequent encounter for fracture with routine healing: Secondary | ICD-10-CM

## 2017-10-17 NOTE — Progress Notes (Signed)
Fracture care follow-up  Chief Complaint  Patient presents with  . Shoulder Pain    follow up right shoulder fracture 09/18/17     PLAN:    X-ray AP lateral right shoulder I read as shows nondisplaced proximal humerus fracture which is comminuted   Recommend sling and    X-ray 3 weeks then start therapy  Encounter Diagnosis  Name Primary?  . Other closed nondisplaced fracture of proximal end of right humerus with routine healing, subsequent encounter 09/18/17      Current Outpatient Medications:  .  acetaminophen (TYLENOL) 500 MG tablet, Take 500 mg by mouth every 6 (six) hours as needed for moderate pain or fever., Disp: , Rfl:  .  aspirin 81 MG tablet, Take 81 mg by mouth daily., Disp: , Rfl:  .  Blood Glucose Monitoring Suppl (BLOOD GLUCOSE MONITOR SYSTEM) W/DEVICE KIT, 1 Units by Does not apply route daily., Disp: 1 each, Rfl: 0 .  clopidogrel (PLAVIX) 75 MG tablet, TAKE 1 TABLET BY MOUTH ONCE DAILY., Disp: 30 tablet, Rfl: 5 .  glimepiride (AMARYL) 1 MG tablet, Take 1 tablet (1 mg total) by mouth daily at 12 noon. Take with Glimepiride 26m to total 380m, Disp: 90 tablet, Rfl: 1 .  glimepiride (AMARYL) 2 MG tablet, Take 1 tablet (2 mg total) by mouth daily at 12 noon. Take with Glimepiride 48m37mo total 3mg2mDisp: 90 tablet, Rfl: 2 .  LANTUS SOLOSTAR 100 UNIT/ML Solostar Pen, INJECT 12 UNITS SUBCUTANEOUSLY AT BEDTIME., Disp: 15 mL, Rfl: 0 .  levothyroxine (SYNTHROID, LEVOTHROID) 50 MCG tablet, TAKE 1 TABLET BY MOUTH ONCE DAILY., Disp: 30 tablet, Rfl: 3 .  linagliptin (TRADJENTA) 5 MG TABS tablet, Take 1 tablet (5 mg total) by mouth daily., Disp: 30 tablet, Rfl: 11 .  lisinopril (PRINIVIL,ZESTRIL) 40 MG tablet, TAKE 1 TABLET BY MOUTH ONCE DAILY., Disp: 30 tablet, Rfl: 3 .  mirtazapine (REMERON) 15 MG tablet, Take 1 tablet (15 mg total) by mouth at bedtime. For appetite, Disp: 90 tablet, Rfl: 2 .  Multiple Vitamin (DAILY VITE) TABS, TAKE 1 TABLET BY MOUTH ONCE DAILY., Disp: 30  tablet, Rfl: 3 .  NOVOFINE AUTOCOVER 30G X 8 MM MISC, USE WITH INSULIN FOR ADMINISTRATION., Disp: 100 each, Rfl: 0 .  Nutritional Supplements (NUTRITIONAL SUPPLEMENT PO), Take by mouth., Disp: , Rfl:  .  pantoprazole (PROTONIX) 40 MG tablet, TAKE 1 TABLET BY MOUTH ONCE DAILY., Disp: 30 tablet, Rfl: 0 .  propranolol (INDERAL) 40 MG tablet, TAKE 1 TABLET BY MOUTH TWICE DAILY., Disp: 60 tablet, Rfl: 3 .  pseudoephedrine-dextromethorphan-guaifenesin (ROBITUSSIN-PE) 30-10-100 MG/5ML solution, Take 5 mLs by mouth every 4 (four) hours as needed for cough., Disp: , Rfl:  .  traMADol (ULTRAM) 50 MG tablet, TAKE 1 TABLET BY MOUTH EVERY 6 HOURS AS NEEDED FOR PAIN. (Patient taking differently: TAKE 1 TABLET BY MOUTH EVERY 12 HOURS), Disp: 45 tablet, Rfl: 3  BP 121/76   Pulse 79   Ht '5\' 4"'  (1.626 m)   Wt 127 lb (57.6 kg)   BMI 21.80 kg/m   Physical Exam  Musculoskeletal:       Arms:   Encounter Diagnosis  Name Primary?  . Other closed nondisplaced fracture of proximal end of right humerus with routine healing, subsequent encounter 09/18/17     Xrays: See dictated report fracture healed  Plan  Therapy for 6 weeks 3 times a week at [facility] follow-up 2 months

## 2017-10-20 ENCOUNTER — Other Ambulatory Visit: Payer: Self-pay | Admitting: *Deleted

## 2017-10-20 ENCOUNTER — Telehealth: Payer: Self-pay | Admitting: Orthopedic Surgery

## 2017-10-20 DIAGNOSIS — R2689 Other abnormalities of gait and mobility: Secondary | ICD-10-CM | POA: Diagnosis not present

## 2017-10-20 DIAGNOSIS — S42294D Other nondisplaced fracture of upper end of right humerus, subsequent encounter for fracture with routine healing: Secondary | ICD-10-CM | POA: Diagnosis not present

## 2017-10-20 DIAGNOSIS — I10 Essential (primary) hypertension: Secondary | ICD-10-CM | POA: Diagnosis not present

## 2017-10-20 DIAGNOSIS — F0390 Unspecified dementia without behavioral disturbance: Secondary | ICD-10-CM | POA: Diagnosis not present

## 2017-10-20 DIAGNOSIS — E119 Type 2 diabetes mellitus without complications: Secondary | ICD-10-CM | POA: Diagnosis not present

## 2017-10-20 DIAGNOSIS — Z794 Long term (current) use of insulin: Secondary | ICD-10-CM | POA: Diagnosis not present

## 2017-10-20 MED ORDER — INSULIN PEN NEEDLE 30G X 8 MM MISC: 11 refills | Status: DC

## 2017-10-20 NOTE — Telephone Encounter (Signed)
Call from MaruenoPete, therapist from [facility], requesting verbal orders per Dr Mort SawyersHarrison's order for therapy per office visit 10/17/17.  [facility].

## 2017-10-21 NOTE — Telephone Encounter (Signed)
I gave verbal order yesterday

## 2017-10-22 DIAGNOSIS — E119 Type 2 diabetes mellitus without complications: Secondary | ICD-10-CM | POA: Diagnosis not present

## 2017-10-22 DIAGNOSIS — F0390 Unspecified dementia without behavioral disturbance: Secondary | ICD-10-CM | POA: Diagnosis not present

## 2017-10-22 DIAGNOSIS — I10 Essential (primary) hypertension: Secondary | ICD-10-CM | POA: Diagnosis not present

## 2017-10-22 DIAGNOSIS — R2689 Other abnormalities of gait and mobility: Secondary | ICD-10-CM | POA: Diagnosis not present

## 2017-10-22 DIAGNOSIS — Z794 Long term (current) use of insulin: Secondary | ICD-10-CM | POA: Diagnosis not present

## 2017-10-22 DIAGNOSIS — S42294D Other nondisplaced fracture of upper end of right humerus, subsequent encounter for fracture with routine healing: Secondary | ICD-10-CM | POA: Diagnosis not present

## 2017-10-23 DIAGNOSIS — I10 Essential (primary) hypertension: Secondary | ICD-10-CM | POA: Diagnosis not present

## 2017-10-23 DIAGNOSIS — Z794 Long term (current) use of insulin: Secondary | ICD-10-CM | POA: Diagnosis not present

## 2017-10-23 DIAGNOSIS — E119 Type 2 diabetes mellitus without complications: Secondary | ICD-10-CM | POA: Diagnosis not present

## 2017-10-23 DIAGNOSIS — F0390 Unspecified dementia without behavioral disturbance: Secondary | ICD-10-CM | POA: Diagnosis not present

## 2017-10-23 DIAGNOSIS — R2689 Other abnormalities of gait and mobility: Secondary | ICD-10-CM | POA: Diagnosis not present

## 2017-10-23 DIAGNOSIS — S42294D Other nondisplaced fracture of upper end of right humerus, subsequent encounter for fracture with routine healing: Secondary | ICD-10-CM | POA: Diagnosis not present

## 2017-10-27 DIAGNOSIS — I10 Essential (primary) hypertension: Secondary | ICD-10-CM | POA: Diagnosis not present

## 2017-10-27 DIAGNOSIS — F0390 Unspecified dementia without behavioral disturbance: Secondary | ICD-10-CM | POA: Diagnosis not present

## 2017-10-27 DIAGNOSIS — R2689 Other abnormalities of gait and mobility: Secondary | ICD-10-CM | POA: Diagnosis not present

## 2017-10-27 DIAGNOSIS — Z794 Long term (current) use of insulin: Secondary | ICD-10-CM | POA: Diagnosis not present

## 2017-10-27 DIAGNOSIS — S42294D Other nondisplaced fracture of upper end of right humerus, subsequent encounter for fracture with routine healing: Secondary | ICD-10-CM | POA: Diagnosis not present

## 2017-10-27 DIAGNOSIS — E119 Type 2 diabetes mellitus without complications: Secondary | ICD-10-CM | POA: Diagnosis not present

## 2017-10-29 DIAGNOSIS — Z794 Long term (current) use of insulin: Secondary | ICD-10-CM | POA: Diagnosis not present

## 2017-10-29 DIAGNOSIS — R2689 Other abnormalities of gait and mobility: Secondary | ICD-10-CM | POA: Diagnosis not present

## 2017-10-29 DIAGNOSIS — I10 Essential (primary) hypertension: Secondary | ICD-10-CM | POA: Diagnosis not present

## 2017-10-29 DIAGNOSIS — F0390 Unspecified dementia without behavioral disturbance: Secondary | ICD-10-CM | POA: Diagnosis not present

## 2017-10-29 DIAGNOSIS — E119 Type 2 diabetes mellitus without complications: Secondary | ICD-10-CM | POA: Diagnosis not present

## 2017-10-29 DIAGNOSIS — S42294D Other nondisplaced fracture of upper end of right humerus, subsequent encounter for fracture with routine healing: Secondary | ICD-10-CM | POA: Diagnosis not present

## 2017-10-30 DIAGNOSIS — S42294D Other nondisplaced fracture of upper end of right humerus, subsequent encounter for fracture with routine healing: Secondary | ICD-10-CM | POA: Diagnosis not present

## 2017-10-30 DIAGNOSIS — R2689 Other abnormalities of gait and mobility: Secondary | ICD-10-CM | POA: Diagnosis not present

## 2017-10-30 DIAGNOSIS — E119 Type 2 diabetes mellitus without complications: Secondary | ICD-10-CM | POA: Diagnosis not present

## 2017-10-30 DIAGNOSIS — Z794 Long term (current) use of insulin: Secondary | ICD-10-CM | POA: Diagnosis not present

## 2017-10-30 DIAGNOSIS — I10 Essential (primary) hypertension: Secondary | ICD-10-CM | POA: Diagnosis not present

## 2017-10-30 DIAGNOSIS — F0390 Unspecified dementia without behavioral disturbance: Secondary | ICD-10-CM | POA: Diagnosis not present

## 2017-11-03 DIAGNOSIS — S42294D Other nondisplaced fracture of upper end of right humerus, subsequent encounter for fracture with routine healing: Secondary | ICD-10-CM | POA: Diagnosis not present

## 2017-11-03 DIAGNOSIS — Z794 Long term (current) use of insulin: Secondary | ICD-10-CM | POA: Diagnosis not present

## 2017-11-03 DIAGNOSIS — I10 Essential (primary) hypertension: Secondary | ICD-10-CM | POA: Diagnosis not present

## 2017-11-03 DIAGNOSIS — R2689 Other abnormalities of gait and mobility: Secondary | ICD-10-CM | POA: Diagnosis not present

## 2017-11-03 DIAGNOSIS — E119 Type 2 diabetes mellitus without complications: Secondary | ICD-10-CM | POA: Diagnosis not present

## 2017-11-03 DIAGNOSIS — F0390 Unspecified dementia without behavioral disturbance: Secondary | ICD-10-CM | POA: Diagnosis not present

## 2017-11-06 DIAGNOSIS — Z794 Long term (current) use of insulin: Secondary | ICD-10-CM | POA: Diagnosis not present

## 2017-11-06 DIAGNOSIS — I10 Essential (primary) hypertension: Secondary | ICD-10-CM | POA: Diagnosis not present

## 2017-11-06 DIAGNOSIS — E119 Type 2 diabetes mellitus without complications: Secondary | ICD-10-CM | POA: Diagnosis not present

## 2017-11-06 DIAGNOSIS — F0390 Unspecified dementia without behavioral disturbance: Secondary | ICD-10-CM | POA: Diagnosis not present

## 2017-11-06 DIAGNOSIS — R2689 Other abnormalities of gait and mobility: Secondary | ICD-10-CM | POA: Diagnosis not present

## 2017-11-06 DIAGNOSIS — S42294D Other nondisplaced fracture of upper end of right humerus, subsequent encounter for fracture with routine healing: Secondary | ICD-10-CM | POA: Diagnosis not present

## 2017-11-11 DIAGNOSIS — I10 Essential (primary) hypertension: Secondary | ICD-10-CM | POA: Diagnosis not present

## 2017-11-11 DIAGNOSIS — F0390 Unspecified dementia without behavioral disturbance: Secondary | ICD-10-CM | POA: Diagnosis not present

## 2017-11-11 DIAGNOSIS — S42294D Other nondisplaced fracture of upper end of right humerus, subsequent encounter for fracture with routine healing: Secondary | ICD-10-CM | POA: Diagnosis not present

## 2017-11-11 DIAGNOSIS — R2689 Other abnormalities of gait and mobility: Secondary | ICD-10-CM | POA: Diagnosis not present

## 2017-11-11 DIAGNOSIS — Z794 Long term (current) use of insulin: Secondary | ICD-10-CM | POA: Diagnosis not present

## 2017-11-11 DIAGNOSIS — E119 Type 2 diabetes mellitus without complications: Secondary | ICD-10-CM | POA: Diagnosis not present

## 2017-11-14 ENCOUNTER — Emergency Department (HOSPITAL_COMMUNITY): Payer: Medicare Other

## 2017-11-14 ENCOUNTER — Inpatient Hospital Stay (HOSPITAL_COMMUNITY)
Admission: EM | Admit: 2017-11-14 | Discharge: 2017-11-17 | DRG: 057 | Disposition: A | Discharge status: Other Acute Inpatient Hospital | Payer: Medicare Other | Attending: Neurology | Admitting: Neurology

## 2017-11-14 DIAGNOSIS — M810 Age-related osteoporosis without current pathological fracture: Secondary | ICD-10-CM | POA: Diagnosis present

## 2017-11-14 DIAGNOSIS — I10 Essential (primary) hypertension: Secondary | ICD-10-CM | POA: Diagnosis present

## 2017-11-14 DIAGNOSIS — F0280 Dementia in other diseases classified elsewhere without behavioral disturbance: Secondary | ICD-10-CM | POA: Diagnosis not present

## 2017-11-14 DIAGNOSIS — I2510 Atherosclerotic heart disease of native coronary artery without angina pectoris: Secondary | ICD-10-CM | POA: Diagnosis present

## 2017-11-14 DIAGNOSIS — Z7902 Long term (current) use of antithrombotics/antiplatelets: Secondary | ICD-10-CM

## 2017-11-14 DIAGNOSIS — R2689 Other abnormalities of gait and mobility: Secondary | ICD-10-CM | POA: Diagnosis not present

## 2017-11-14 DIAGNOSIS — G319 Degenerative disease of nervous system, unspecified: Secondary | ICD-10-CM | POA: Diagnosis not present

## 2017-11-14 DIAGNOSIS — Z79890 Hormone replacement therapy (postmenopausal): Secondary | ICD-10-CM | POA: Diagnosis not present

## 2017-11-14 DIAGNOSIS — G459 Transient cerebral ischemic attack, unspecified: Secondary | ICD-10-CM | POA: Diagnosis not present

## 2017-11-14 DIAGNOSIS — S42294D Other nondisplaced fracture of upper end of right humerus, subsequent encounter for fracture with routine healing: Secondary | ICD-10-CM | POA: Diagnosis not present

## 2017-11-14 DIAGNOSIS — R29707 NIHSS score 7: Secondary | ICD-10-CM | POA: Diagnosis present

## 2017-11-14 DIAGNOSIS — I5030 Unspecified diastolic (congestive) heart failure: Secondary | ICD-10-CM | POA: Diagnosis not present

## 2017-11-14 DIAGNOSIS — I447 Left bundle-branch block, unspecified: Secondary | ICD-10-CM | POA: Diagnosis present

## 2017-11-14 DIAGNOSIS — E119 Type 2 diabetes mellitus without complications: Secondary | ICD-10-CM

## 2017-11-14 DIAGNOSIS — E876 Hypokalemia: Secondary | ICD-10-CM | POA: Diagnosis not present

## 2017-11-14 DIAGNOSIS — M1990 Unspecified osteoarthritis, unspecified site: Secondary | ICD-10-CM | POA: Diagnosis present

## 2017-11-14 DIAGNOSIS — Z8249 Family history of ischemic heart disease and other diseases of the circulatory system: Secondary | ICD-10-CM

## 2017-11-14 DIAGNOSIS — E1159 Type 2 diabetes mellitus with other circulatory complications: Secondary | ICD-10-CM | POA: Diagnosis not present

## 2017-11-14 DIAGNOSIS — F0150 Vascular dementia without behavioral disturbance: Secondary | ICD-10-CM | POA: Diagnosis not present

## 2017-11-14 DIAGNOSIS — E785 Hyperlipidemia, unspecified: Secondary | ICD-10-CM | POA: Diagnosis not present

## 2017-11-14 DIAGNOSIS — G309 Alzheimer's disease, unspecified: Secondary | ICD-10-CM | POA: Diagnosis present

## 2017-11-14 DIAGNOSIS — I639 Cerebral infarction, unspecified: Secondary | ICD-10-CM

## 2017-11-14 DIAGNOSIS — M6281 Muscle weakness (generalized): Secondary | ICD-10-CM | POA: Diagnosis not present

## 2017-11-14 DIAGNOSIS — R4701 Aphasia: Secondary | ICD-10-CM | POA: Diagnosis present

## 2017-11-14 DIAGNOSIS — E1165 Type 2 diabetes mellitus with hyperglycemia: Secondary | ICD-10-CM | POA: Diagnosis present

## 2017-11-14 DIAGNOSIS — Z7982 Long term (current) use of aspirin: Secondary | ICD-10-CM

## 2017-11-14 DIAGNOSIS — R2990 Unspecified symptoms and signs involving the nervous system: Secondary | ICD-10-CM | POA: Diagnosis present

## 2017-11-14 DIAGNOSIS — G301 Alzheimer's disease with late onset: Secondary | ICD-10-CM | POA: Diagnosis not present

## 2017-11-14 DIAGNOSIS — E039 Hypothyroidism, unspecified: Secondary | ICD-10-CM | POA: Diagnosis present

## 2017-11-14 DIAGNOSIS — D696 Thrombocytopenia, unspecified: Secondary | ICD-10-CM | POA: Diagnosis present

## 2017-11-14 DIAGNOSIS — R471 Dysarthria and anarthria: Secondary | ICD-10-CM | POA: Diagnosis present

## 2017-11-14 DIAGNOSIS — R4182 Altered mental status, unspecified: Secondary | ICD-10-CM | POA: Diagnosis not present

## 2017-11-14 DIAGNOSIS — K219 Gastro-esophageal reflux disease without esophagitis: Secondary | ICD-10-CM | POA: Diagnosis present

## 2017-11-14 DIAGNOSIS — R0989 Other specified symptoms and signs involving the circulatory and respiratory systems: Secondary | ICD-10-CM | POA: Diagnosis not present

## 2017-11-14 DIAGNOSIS — Z794 Long term (current) use of insulin: Secondary | ICD-10-CM

## 2017-11-14 DIAGNOSIS — R531 Weakness: Secondary | ICD-10-CM | POA: Diagnosis not present

## 2017-11-14 DIAGNOSIS — Z8673 Personal history of transient ischemic attack (TIA), and cerebral infarction without residual deficits: Secondary | ICD-10-CM | POA: Diagnosis not present

## 2017-11-14 DIAGNOSIS — R269 Unspecified abnormalities of gait and mobility: Secondary | ICD-10-CM | POA: Diagnosis not present

## 2017-11-14 DIAGNOSIS — S42301A Unspecified fracture of shaft of humerus, right arm, initial encounter for closed fracture: Secondary | ICD-10-CM | POA: Diagnosis present

## 2017-11-14 DIAGNOSIS — I6789 Other cerebrovascular disease: Secondary | ICD-10-CM | POA: Diagnosis not present

## 2017-11-14 DIAGNOSIS — Z803 Family history of malignant neoplasm of breast: Secondary | ICD-10-CM

## 2017-11-14 DIAGNOSIS — R4781 Slurred speech: Secondary | ICD-10-CM | POA: Diagnosis not present

## 2017-11-14 DIAGNOSIS — F0390 Unspecified dementia without behavioral disturbance: Secondary | ICD-10-CM | POA: Diagnosis not present

## 2017-11-14 LAB — CBC
HCT: 41.3 % (ref 36.0–46.0)
Hemoglobin: 13 g/dL (ref 12.0–15.0)
MCH: 26.8 pg (ref 26.0–34.0)
MCHC: 31.5 g/dL (ref 30.0–36.0)
MCV: 85.2 fL (ref 78.0–100.0)
PLATELETS: 126 10*3/uL — AB (ref 150–400)
RBC: 4.85 MIL/uL (ref 3.87–5.11)
RDW: 14.4 % (ref 11.5–15.5)
WBC: 8.1 10*3/uL (ref 4.0–10.5)

## 2017-11-14 LAB — DIFFERENTIAL
Basophils Absolute: 0 10*3/uL (ref 0.0–0.1)
Basophils Relative: 0 %
EOS ABS: 0.2 10*3/uL (ref 0.0–0.7)
EOS PCT: 3 %
Lymphocytes Relative: 25 %
Lymphs Abs: 2 10*3/uL (ref 0.7–4.0)
Monocytes Absolute: 0.5 10*3/uL (ref 0.1–1.0)
Monocytes Relative: 6 %
Neutro Abs: 5.3 10*3/uL (ref 1.7–7.7)
Neutrophils Relative %: 66 %

## 2017-11-14 LAB — URINALYSIS, ROUTINE W REFLEX MICROSCOPIC
Bacteria, UA: NONE SEEN
Bilirubin Urine: NEGATIVE
GLUCOSE, UA: 50 mg/dL — AB
Hgb urine dipstick: NEGATIVE
Ketones, ur: NEGATIVE mg/dL
Nitrite: NEGATIVE
PROTEIN: NEGATIVE mg/dL
SQUAMOUS EPITHELIAL / LPF: NONE SEEN
Specific Gravity, Urine: 1.018 (ref 1.005–1.030)
pH: 6 (ref 5.0–8.0)

## 2017-11-14 LAB — RAPID URINE DRUG SCREEN, HOSP PERFORMED
AMPHETAMINES: NOT DETECTED
BENZODIAZEPINES: NOT DETECTED
Barbiturates: NOT DETECTED
COCAINE: NOT DETECTED
Opiates: NOT DETECTED
Tetrahydrocannabinol: NOT DETECTED

## 2017-11-14 LAB — COMPREHENSIVE METABOLIC PANEL
ALT: 12 U/L — ABNORMAL LOW (ref 14–54)
ANION GAP: 11 (ref 5–15)
AST: 15 U/L (ref 15–41)
Albumin: 3.7 g/dL (ref 3.5–5.0)
Alkaline Phosphatase: 67 U/L (ref 38–126)
BILIRUBIN TOTAL: 0.4 mg/dL (ref 0.3–1.2)
BUN: 17 mg/dL (ref 6–20)
CO2: 26 mmol/L (ref 22–32)
Calcium: 9.1 mg/dL (ref 8.9–10.3)
Chloride: 103 mmol/L (ref 101–111)
Creatinine, Ser: 0.79 mg/dL (ref 0.44–1.00)
GFR calc Af Amer: >60 mL/min (ref >60)
GFR calc non Af Amer: >60 mL/min (ref >60)
GLUCOSE: 260 mg/dL — AB (ref 65–99)
POTASSIUM: 3.7 mmol/L (ref 3.5–5.1)
SODIUM: 140 mmol/L (ref 135–145)
TOTAL PROTEIN: 6.4 g/dL — AB (ref 6.5–8.1)

## 2017-11-14 LAB — PROTIME-INR
INR: 1.04
PROTHROMBIN TIME: 13.5 s (ref 11.4–15.2)

## 2017-11-14 LAB — ETHANOL

## 2017-11-14 LAB — APTT: aPTT: 30 seconds (ref 24–36)

## 2017-11-14 MED ORDER — IOPAMIDOL (ISOVUE-370) INJECTION 76%
40.0000 mL | Freq: Once | INTRAVENOUS | Status: AC | PRN
  Administered 2017-11-14: 40 mL via INTRAVENOUS

## 2017-11-14 MED ORDER — ALTEPLASE (STROKE) FULL DOSE INFUSION: 0.9000 mg/kg | Freq: Once | INTRAVENOUS | Status: DC

## 2017-11-14 MED ORDER — ALTEPLASE (STROKE) FULL DOSE INFUSION
0.9000 mg/kg | Freq: Once | INTRAVENOUS | Status: AC
  Administered 2017-11-14: 56 mg via INTRAVENOUS
  Filled 2017-11-14: qty 100

## 2017-11-14 MED ORDER — SODIUM CHLORIDE 0.9 % IV SOLN: 50.0000 mL | Freq: Once | INTRAVENOUS | Status: DC

## 2017-11-14 MED ORDER — ALTEPLASE 100 MG IV SOLR
INTRAVENOUS | Status: AC
  Filled 2017-11-14: qty 100

## 2017-11-14 NOTE — Progress Notes (Signed)
[facility] DR. Zackowski  [phone]  Beeper at 954pm called 955pm Patient arrived 1003pm patient scanned 1005pm Images sent to soc, radiology called, images completed 1008

## 2017-11-14 NOTE — ED Provider Notes (Signed)
[facility] EMERGENCY DEPARTMENT Provider Note   CSN: [id number] Arrival date & time: 11/14/17  2156     History   Chief Complaint Chief Complaint  Patient presents with  . Code Stroke    HPI Kristin Grant is a 77 y.o. female.  Patient brought in by EMS with concerns for code stroke.  Patient last seen normal between 730 and 8 PM.  She is a nursing home patient normally is able to walk normally able to speak okay.  Patient noted not to be able to speak well also seem to have some lower extremity weakness.  Had a gaze to the left side.  Patient not on blood thinners.  Patient does have a history of Alzheimer's disease.  Patient upon arrival was immediately evaluated the symptoms seem to be present.  The seem to be some lower extremity weakness patient able to state a few words that seemed appropriate but not talking in sentences.  Seemed to ignore her vision on the right side.  Patient taken immediately for CT head for possible code stroke.  What they thought was a hematoma to the right occiput area.  No bleeding.     Past Medical History:  Diagnosis Date  . Alzheimer disease   . Arthritis   . Coronary artery disease   . Diabetes mellitus   . Difficulty walking   . Hypertension   . Reflux   . Stroke [facility])     Patient Active Problem List   Diagnosis Date Noted  . Closed fracture of proximal end of right humerus 09/18/17 10/16/2017  . Osteoporosis 01/09/2017  . Loss of weight 01/12/2016  . CVA (cerebral vascular accident) ([place]) 11/22/2015  . Diabetes mellitus, type II ([place]) 06/22/2014  . Protein-calorie malnutrition ([place]) 12/15/2013  . Fall 08/17/2013  . Back pain 08/17/2013  . Pressure ulcer, stage 1 07/06/2013  . Abnormal development of nail 03/28/2013  . Gait abnormality 12/10/2011  . Dementia 11/21/2011  . Hypertension 11/21/2011  . Hypothyroidism 11/21/2011    Past Surgical History:  Procedure Laterality Date  . APPENDECTOMY    . FINGER DEBRIDEMENT       OB  History   None      Home Medications    Prior to Admission medications   Medication Sig Start Date End Date Taking? Authorizing Provider  acetaminophen (TYLENOL) 500 MG tablet Take 500 mg by mouth every 6 (six) hours as needed for moderate pain or fever.    [provider]  aspirin 81 MG tablet Take 81 mg by mouth daily.    [provider]  Blood Glucose Monitoring Suppl (BLOOD GLUCOSE MONITOR SYSTEM) W/DEVICE KIT 1 Units by Does not apply route daily. 07/19/13   [name], MD  clopidogrel (PLAVIX) 75 MG tablet TAKE 1 TABLET BY MOUTH ONCE DAILY. 08/09/13   [place], Modena Nunnery, MD  glimepiride (AMARYL) 1 MG tablet Take 1 tablet (1 mg total) by mouth daily at 12 noon. Take with Glimepiride 65m to total 315m 09/24/17   [place], KaModena NunneryMD  glimepiride (AMARYL) 2 MG tablet Take 1 tablet (2 mg total) by mouth daily at 12 noon. Take with Glimepiride 42m542mo total 3mg48m/30/19   [place], K[place]ena Nunnery  Insulin Pen Needle (NOVOFINE AUTOCOVER) 30G X 8 MM MISC USE WITH INSULIN FOR ADMINISTRATION. 10/20/17   DurhAlycia Rossetti  LANTUS SOLOSTAR 100 UNIT/ML Solostar Pen INJECT 12 UNITS SUBCUTANEOUSLY AT BEDTIME. 07/25/17   DurhAlycia Rossetti  levothyroxine (SYNAlgonquin  LEVOTHROID) 50 MCG tablet TAKE 1 TABLET BY MOUTH ONCE DAILY. 08/09/13   [name], MD  linagliptin (TRADJENTA) 5 MG TABS tablet Take 1 tablet (5 mg total) by mouth daily. 09/04/16   [place], Modena Nunnery, MD  lisinopril (PRINIVIL,ZESTRIL) 40 MG tablet TAKE 1 TABLET BY MOUTH ONCE DAILY. 08/09/13   [name], MD  mirtazapine (REMERON) 15 MG tablet Take 1 tablet (15 mg total) by mouth at bedtime. For appetite 05/12/17   [place], Modena Nunnery, MD  Multiple Vitamin (DAILY VITE) TABS TAKE 1 TABLET BY MOUTH ONCE DAILY. 08/09/13   [name], MD  Nutritional Supplements (NUTRITIONAL SUPPLEMENT PO) Take by mouth.    [provider]  pantoprazole (PROTONIX) 40 MG tablet TAKE 1 TABLET BY MOUTH ONCE  DAILY. 11/26/16   [name], MD  propranolol (INDERAL) 40 MG tablet TAKE 1 TABLET BY MOUTH TWICE DAILY. 08/09/13   [place], Modena Nunnery, MD  pseudoephedrine-dextromethorphan-guaifenesin (ROBITUSSIN-PE) 30-10-100 MG/5ML solution Take 5 mLs by mouth every 4 (four) hours as needed for cough.    [provider]  traMADol (ULTRAM) 50 MG tablet TAKE 1 TABLET BY MOUTH EVERY 6 HOURS AS NEEDED FOR PAIN. Patient taking differently: TAKE 1 TABLET BY MOUTH EVERY 12 HOURS 12/22/13   [name], MD    Family History Family History  Problem Relation Age of Onset  . Hypertension Mother   . Heart disease Mother   . Hypertension Father   . Heart disease Father   . Hypertension Sister   . Heart disease Sister   . Cancer Sister        BREAST   . Hypertension Brother   . Heart disease Brother     Social History Social History   Tobacco Use  . Smoking status: Never Smoker  . Smokeless tobacco: Never Used  Substance Use Topics  . Alcohol use: No  . Drug use: No     Allergies   Patient has no known allergies.   Review of Systems Review of Systems  Unable to perform ROS: Patient nonverbal     Physical Exam Updated Vital Signs BP (!) 165/95   Pulse 80   Resp (!) 23   Wt 61.9 kg (136 lb 7.4 oz)   SpO2 100%   BMI 23.42 kg/m   Physical Exam  Constitutional: She appears well-developed and well-nourished.  HENT:  Head: Normocephalic and atraumatic.  Exception there is a large bump that seemed to be may be a little tender to her right occiput area.   questionable hematoma but no evidence of any bleeding  Eyes:  We will do with tracking with her eyes.  Does seem to go to both left and right side.  There seems to be a right-sided visual deficit.  Neck: Neck supple.  Cardiovascular: Normal rate and regular rhythm.  Pulmonary/Chest: Effort normal and breath sounds normal.  Abdominal: Soft. Bowel sounds are normal.  Musculoskeletal: Normal range of motion. She exhibits  no edema.  Neurological: She is alert.  Difficulty speaking only says occasional word that is appropriate.  No word salad.  Bilateral lower extremity weakness initially seemed to be more left side than right.  Upper extremity seem to be fine.  Skin: Skin is warm.  Nursing note and vitals reviewed.    ED Treatments / Results  Labs (all labs ordered are listed, but only abnormal results are displayed) Labs Reviewed  CBC - Abnormal; Notable for the following components:  Result Value   Platelets 126 (*)    All other components within normal limits  COMPREHENSIVE METABOLIC PANEL - Abnormal; Notable for the following components:   Glucose, Bld 260 (*)    Total Protein 6.4 (*)    ALT 12 (*)    All other components within normal limits  ETHANOL  PROTIME-INR  APTT  DIFFERENTIAL  RAPID URINE DRUG SCREEN, HOSP PERFORMED  URINALYSIS, ROUTINE W REFLEX MICROSCOPIC  I-STAT CHEM 8, ED  I-STAT TROPONIN, ED    EKG None  Radiology Ct Cerebral Perfusion W Contrast  Result Date: 11/14/2017 CLINICAL DATA:  Right-sided weakness EXAM: CT PERFUSION BRAIN TECHNIQUE: Multiphase CT imaging of the brain was performed following IV bolus contrast injection. Subsequent parametric perfusion maps were calculated using RAPID software. CONTRAST:  84m ISOVUE-370 IOPAMIDOL (ISOVUE-370) INJECTION 76% COMPARISON:  Head CT 11/14/2017 FINDINGS: CT Brain Perfusion Findings: CBF (<30%) Volume: 076mPerfusion (Tmax>6.0s) volume: 90m71mismatch Volume: 90mL73mfarction Location:None IMPRESSION: No acute infarct or ischemic penumbra demonstrated by CT perfusion criteria. Electronically Signed   By: KeviUlyses Jarred.   On: 11/14/2017 22:49   Ct Head Code Stroke Wo Contrast`  Result Date: 11/14/2017 CLINICAL DATA:  Code stroke. Right-sided weakness and altered mental status EXAM: CT HEAD WITHOUT CONTRAST TECHNIQUE: Contiguous axial images were obtained from the base of the skull through the vertex without intravenous  contrast. COMPARISON:  Head CT 09/18/2017 FINDINGS: Brain: No mass lesion or acute hemorrhage. No focal hypoattenuation of the basal ganglia or cortex to indicate infarcted tissue. There is periventricular hypoattenuation compatible with chronic microvascular disease. Vascular: No hyperdense vessel. Atherosclerotic calcification of the internal carotid and vertebral arteries at the skull base. Skull: No skull fracture or scalp hematoma. Unchanged appearance of low attenuation right scalp lesion, likely dermoid or lipoma. Sinuses/Orbits: No sinus fluid levels or advanced mucosal thickening. No mastoid effusion. Normal orbits. ASPECTS ([facility] Early CT Score) - Ganglionic level infarction (caudate, lentiform nuclei, internal capsule, insula, M1-M3 cortex): 7 - Supraganglionic infarction (M4-M6 cortex): 3 Total score (0-10 with 10 being normal): 10 IMPRESSION: 1. 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 2. ASPECTS is 10. These results were called by telephone at the time of interpretation on 11/14/2017 at 10:19 pm to Dr. SCOTFredia Sorrowho verbally acknowledged these results. Electronically Signed   By: KeviUlyses Jarred.   On: 11/14/2017 22:20    Procedures Procedures (including critical care time)  CRITICAL CARE Performed by: ZACKFredia Sorrowal critical care time: 90 minutes Critical care time was exclusive of separately billable procedures and treating other patients. Critical care was necessary to treat or prevent imminent or life-threatening deterioration. Critical care was time spent personally by me on the following activities: development of treatment plan with patient and/or surrogate as well as nursing, discussions with consultants, evaluation of patient's response to treatment, examination of patient, obtaining history from patient or surrogate, ordering and performing treatments and interventions, ordering and review of laboratory  studies, ordering and review of radiographic studies, pulse oximetry and re-evaluation of patient's condition.   Medications Ordered in ED Medications  alteplase (ACTIVASE) 1 mg/mL infusion 56 mg (has no administration in time range)    Followed by  0.9 %  sodium chloride infusion (has no administration in time range)  alteplase (ACTIVASE) 1 mg/mL injection (has no administration in time range)  iopamidol (ISOVUE-370) 76 % injection 40 mL (40 mLs Intravenous Contrast Given 11/14/17 2240)     Initial Impression / Assessment and Plan /  ED Course  I have reviewed the triage vital signs and the nursing notes.  Pertinent labs & imaging results that were available during my care of the patient were reviewed by me and considered in my medical decision making (see chart for details).    Patient treated as a code stroke activated.  Head CT negative for any bleed.  Neuro telemetry got involved.  Due to the aphasia and the visual field defect patient was considered a candidate for TPA.  Patient did go for CT perfusion which was read as normal.  TPA initiated.  With the assistance of the tele-neurologist.  Contacted our neuro hospitalist at [facility] at the stroke center he has accepted the patient to the ICU.  Patient will be transferred by [place].    Patient's lower extremity weakness seem to improve.  Still with a aphasia persisted.  Some of the visual field deficit seem to persist.     Final Clinical Impressions(s) / ED Diagnoses   Final diagnoses:  Aphasia  Cerebrovascular accident (CVA), unspecified mechanism [facility])    ED Discharge Orders    None       Fredia Sorrow, MD 11/14/17 2326

## 2017-11-14 NOTE — ED Triage Notes (Signed)
Per EMS pt was last seen normal around 2000 tonight. Pt was found in her bed tonight around 2115 drooling and pt also has large hematoma to the right occiput. Facility denies pt fell. Pt normally walking and talking with no deficits.

## 2017-11-14 NOTE — Progress Notes (Signed)
[facility] TeleNeurology Consult Services  Impression: Stroke left hemispheric cortical infarct No hx of GI bleed, no recent cva, on asa and plavix only, no liver disease, no coagulopathy. Verified by nurse at the nursing facility: [facility] [place].   Symptoms are consistent with LVO therefore s/p CTA H/N and perfusion  Differential Diagnosis:  1. Cardioembolic stroke  2. Small vessel disease/lacune  3. Thromboembolic, artery-to-artery mechanism  4. Hypercoagulable state-related infarct  5. Transient ischemic attack  6. Thrombotic mechanism, large artery disease   Comments:   Door time:  21:56 [facility] contacted: 22:16 [facility] at bedside: 22:22 NIHSS assessment time: 22:46 Verbal tpa order: 22:48 Needle time: 23:17  Verbal Consent to tPA:   Per the chart review and nursing home records.   The patient denies any  history of severe head trauma within the last 3 months, prior intracranial hemorrhage, structural gastrointestinal malignancy, intra-axial neoplasm, unsecured aneurysm greater than 10mm, intracranial or intraspinal surgery within the past 3 months, recent gastrointestinal or urinary tract hemorrhage within the previous 21 days, symptoms suggestive of subarachnoid hemorrhage, infective endocarditis, active internal bleeding with the exception of certain types of vaginal bleeding,  or known or suspected aortic arch dissection.   The patient does not have an INR greater than 1.7, PT greater than 15 seconds, or a pTT greater than 40 seconds, or platelet count less than 100,000 mm3.   CT does not show extensive regions hypodensity (multilobar infarct) and there is no evidence of acute hemorrhage.   The patient is not currently on IV antiplatelet agents, therapeutic dose of LMWH, or has used a direct thrombin inhibitor or direct factor Xa inhibitor.    I have explained to the patient the nature of the patient's condition, the use of tPA fibrinolytic  agent, and the benefits to be reasonably expected compared with alternative approaches  Unable to reach family, attempted numerous times to reach son and daughter in law. Discussed with ED attending who agrees to proceed with the plan to proceed given her symptoms.   1. Death, Stroke or permanent neurologic injury (paralysis, coma, etc)   2. Worsening of stroke symptoms from swelling or bleeding in the brain   3. Bleeding in other parts of the body   4. Need for blood transfusions to replace blood or clotting factors   5. Allergic reaction to medications   6. Other unexpected complications    All questions were answered and the patient/family/guardian express understanding of the treatment plan and consent to the procedure.  Our recommendations are outlined below.   We will be seeing the patient back in follow up as noted.    Recommendations:   IV tPA - dose = 55.7 Routine post tPA monitoring including neuro checks and blood pressure control during/after treatment Monitor blood pressure Check blood pressure and NIHSS every 15 min for 2 h, then every 30 min for 6 h, and finally every hour for 16 h  Systolic greater than 180 OR diastolic greater than 105: Option 1: Labetalol 10 mg IV for 1 - 2 min May repeat or double labetalol every 10 min to maximum dose of 300 mg, or give initial labetalol dose, then start labetalol drip at 2 - 8 mg/min. Option 2: Nicardipine 5 mg/h IV infusion as initial dose and titrate to desired effect by increasing 2.5 mg/h every 5 min to maximum of 15 mg/h;  If blood pressure is not controlled by labetolol or nicardipine, consider sodium nitroprusside.  Admission to ICU CT brain 24  hours post tPA NPO until swallowing screen performed and passed No antiplatelet agents or anticoagulants (including heparin for DVT prophylaxis) in first 24 hours No Foley catheter, nasogastric tube, arterial catheter or central venous catheter for 24 hr, unless absolutely  necessary Telemetry  Inpatient Neurology Consultation Stroke evaluation as per inpatient neurology recommendations Discussed with ED MD   ------------------------------------------------------------------------------  CC aphasia  History of Present Illness   Patient is a  77 yr old with PMH of HTN, DM, chronic CVA, alzheimrs who presents after being found aphasic and drooling from the nursing facility. She had supper at 8pm and was seen at baseline. They found her at 9:15pm aphasic, drooling. She was laying in her bed with right posterior scalp hematoma. Facility denied any falls . At baseline she is walking and talking without any deficits. Last stroke unclear as to when. CT from 12/2016 small vessel disease, no stroke is mentioned. She was hospitalized in January and had a head injury and contusion to the righ forehead while bending down to put her shoes on. She also had a right shoulder fracture on 09/18/17, she is treated conservatively with a sling and PT.  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   CT brain: aspects 10, severe chronic ischemic microangiopathy.  Perfusion.   Exam   NIHSS score: 7 Not following all commands, VF right, somewhat aphasic  Medical Decision Making:  - Extensive number of diagnosis or management options are considered above.   - Extensive amount of complex data reviewed.   - High risk of complication and/or morbidity or mortality are associated with differential diagnostic considerations above.  - There may be Uncertain outcome and increased probability of prolonged functional impairment or high probability of severe prolonged functional impairment associated with some of these differential diagnosis.  Medical Data Reviewed:  1.Data reviewed include clinical labs, radiology,  Medical Tests;   2.Tests results discussed w/performing or interpreting physician;   3.Obtaining/reviewing old medical records;  4.Obtaining case history  from another source;  5.Independent review of image, tracing or specimen.    Patient was informed the Neurology Consult would happen via telehealth (remote video) and consented to receiving care in this manner.

## 2017-11-14 NOTE — ED Notes (Signed)
[facility]

## 2017-11-14 NOTE — ED Notes (Signed)
Report to Barbara with [facility]  

## 2017-11-15 ENCOUNTER — Inpatient Hospital Stay (HOSPITAL_COMMUNITY): Payer: Medicare Other

## 2017-11-15 ENCOUNTER — Other Ambulatory Visit (HOSPITAL_COMMUNITY): Payer: Medicare Other

## 2017-11-15 DIAGNOSIS — I10 Essential (primary) hypertension: Secondary | ICD-10-CM

## 2017-11-15 DIAGNOSIS — R0989 Other specified symptoms and signs involving the circulatory and respiratory systems: Secondary | ICD-10-CM | POA: Diagnosis present

## 2017-11-15 DIAGNOSIS — R4182 Altered mental status, unspecified: Secondary | ICD-10-CM

## 2017-11-15 DIAGNOSIS — Z8673 Personal history of transient ischemic attack (TIA), and cerebral infarction without residual deficits: Secondary | ICD-10-CM | POA: Insufficient documentation

## 2017-11-15 DIAGNOSIS — E1159 Type 2 diabetes mellitus with other circulatory complications: Secondary | ICD-10-CM

## 2017-11-15 LAB — LIPID PANEL
CHOLESTEROL: 97 mg/dL (ref 0–200)
HDL: 35 mg/dL — ABNORMAL LOW (ref >40)
LDL Cholesterol: 47 mg/dL (ref 0–99)
TRIGLYCERIDES: 75 mg/dL (ref <150)
Total CHOL/HDL Ratio: 2.8 RATIO
VLDL: 15 mg/dL (ref 0–40)

## 2017-11-15 LAB — HEMOGLOBIN A1C
HEMOGLOBIN A1C: 7 % — AB (ref 4.8–5.6)
MEAN PLASMA GLUCOSE: 154.2 mg/dL

## 2017-11-15 LAB — CBC
HCT: 41.2 % (ref 36.0–46.0)
Hemoglobin: 13.1 g/dL (ref 12.0–15.0)
MCH: 27 pg (ref 26.0–34.0)
MCHC: 31.8 g/dL (ref 30.0–36.0)
MCV: 84.9 fL (ref 78.0–100.0)
PLATELETS: 123 10*3/uL — AB (ref 150–400)
RBC: 4.85 MIL/uL (ref 3.87–5.11)
RDW: 14.6 % (ref 11.5–15.5)
WBC: 7.5 10*3/uL (ref 4.0–10.5)

## 2017-11-15 LAB — GLUCOSE, CAPILLARY
GLUCOSE-CAPILLARY: 191 mg/dL — AB (ref 65–99)
GLUCOSE-CAPILLARY: 169 mg/dL — AB (ref 65–99)
GLUCOSE-CAPILLARY: 176 mg/dL — AB (ref 65–99)
Glucose-Capillary: 152 mg/dL — ABNORMAL HIGH (ref 65–99)
Glucose-Capillary: 266 mg/dL — ABNORMAL HIGH (ref 65–99)

## 2017-11-15 LAB — MRSA PCR SCREENING: MRSA BY PCR: NEGATIVE

## 2017-11-15 MED ORDER — ACETAMINOPHEN 325 MG PO TABS: 650.0000 mg | ORAL_TABLET | ORAL | Status: DC | PRN

## 2017-11-15 MED ORDER — SODIUM CHLORIDE 0.9 % IV SOLN
50.0000 mL/h | INTRAVENOUS | Status: DC
  Administered 2017-11-15: 50 mL/h via INTRAVENOUS

## 2017-11-15 MED ORDER — STROKE: EARLY STAGES OF RECOVERY BOOK
Freq: Once | Status: AC
  Administered 2017-11-15: 02:00:00
  Filled 2017-11-15: qty 1

## 2017-11-15 MED ORDER — NICARDIPINE HCL IN NACL 20-0.86 MG/200ML-% IV SOLN: 0.0000 mg/h | INTRAVENOUS | Status: DC | PRN

## 2017-11-15 MED ORDER — PANTOPRAZOLE SODIUM 40 MG IV SOLR
40.0000 mg | Freq: Every day | INTRAVENOUS | Status: DC
  Administered 2017-11-15 (×2): 40 mg via INTRAVENOUS
  Filled 2017-11-15 (×2): qty 40

## 2017-11-15 MED ORDER — INSULIN ASPART 100 UNIT/ML ~~LOC~~ SOLN
0.0000 [IU] | Freq: Three times a day (TID) | SUBCUTANEOUS | Status: DC
  Administered 2017-11-15 – 2017-11-16 (×4): 3 [IU] via SUBCUTANEOUS
  Administered 2017-11-16 – 2017-11-17 (×2): 5 [IU] via SUBCUTANEOUS

## 2017-11-15 MED ORDER — ORAL CARE MOUTH RINSE
15.0000 mL | Freq: Two times a day (BID) | OROMUCOSAL | Status: DC
  Administered 2017-11-15 – 2017-11-17 (×4): 15 mL via OROMUCOSAL

## 2017-11-15 MED ORDER — ACETAMINOPHEN 160 MG/5ML PO SOLN: 650.0000 mg | ORAL | Status: DC | PRN

## 2017-11-15 MED ORDER — ACETAMINOPHEN 650 MG RE SUPP: 650.0000 mg | RECTAL | Status: DC | PRN

## 2017-11-15 MED ORDER — IOPAMIDOL (ISOVUE-370) INJECTION 76%
50.0000 mL | Freq: Once | INTRAVENOUS | Status: AC | PRN
  Administered 2017-11-15: 1 mL via INTRAVENOUS

## 2017-11-15 MED ORDER — LABETALOL HCL 5 MG/ML IV SOLN: 10.0000 mg | Freq: Once | INTRAVENOUS | Status: DC | PRN

## 2017-11-15 MED ORDER — IOPAMIDOL (ISOVUE-370) INJECTION 76%
INTRAVENOUS | Status: AC
  Administered 2017-11-15: 50 mL
  Filled 2017-11-15: qty 50

## 2017-11-15 NOTE — H&P (Signed)
Neurology H&P  CC: Aphasia  History is obtained from: Chart review  HPI: Jennett Tarbell is a 77 y.o. female with a history of dementia who presented with acute mental status change.  She was found aphasic and drooling at the nursing facility.  She was  Last seen well at 8 PM and found at 9:15 PM aphasic.    She had a right shoulder fracture in January she is currently being treated with physical therapy and sling.   She was evaluated by tele-neurology and treated with IV TPA and has had some improvement though she is still mildly aphasic.   LKW: 8 PM tpa given?:  Yes Modified Rankin Scale: 2-Slight disability-UNABLE to perform all activities but does not need assistance  ROS: A 14 point ROS was performed and is negative except as noted in the HPI.   Past Medical History:  Diagnosis Date  . Alzheimer disease   . Arthritis   . Coronary artery disease   . Diabetes mellitus   . Difficulty walking   . Hypertension   . Reflux   . Stroke [facility])      Family History  Problem Relation Age of Onset  . Hypertension Mother   . Heart disease Mother   . Hypertension Father   . Heart disease Father   . Hypertension Sister   . Heart disease Sister   . Cancer Sister        BREAST   . Hypertension Brother   . Heart disease Brother      Social History:  reports that she has never smoked. She has never used smokeless tobacco. She reports that she does not drink alcohol or use drugs.   Exam: Current vital signs: BP 138/70   Pulse 76   Resp 16   Wt 61.9 kg (136 lb 7.4 oz)   SpO2 99%   BMI 23.42 kg/m  Vital signs in last 24 hours: Pulse Rate:  [75-81] 76 (03/22 2334) Resp:  [16-34] 16 (03/22 2334) BP: (132-165)/(59-100) 138/70 (03/22 2334) SpO2:  [95 %-100 %] 99 % (03/22 2334) Weight:  [61.9 kg (136 lb 7.4 oz)] 61.9 kg (136 lb 7.4 oz) (03/22 2252)  Physical Exam  Constitutional: Appears elderly Psych: Affect appropriate to situation Eyes: No scleral injection HENT: No OP  obstrucion Head: Normocephalic.  Cardiovascular: Normal rate and regular rhythm.  Respiratory: Effort normal and breath sounds normal to anterior ascultation GI: Soft.  No distension. There is no tenderness.  Skin: WDI  Neuro: Mental Status: Patient is awake, alert, oriented to person, place, but not month or year.  She is able to answer questions and follow commands, but some difficulty with repetition and some word substitution. Cranial Nerves: II: Visual Fields are full. Pupils are equal, round, and reactive to light.   III,IV, VI: she has a left exotropia V: Facial sensation is symmetric to temperature VII: Facial movement is symmetric.  VIII: hearing is intact to voice X: Uvula elevates symmetrically XI: Shoulder shrug is symmetric. XII: tongue is midline without atrophy or fasciculations.  Motor: Tone is normal. Bulk is normal. She has good distal strength in the right, arm(though hand is arthiritc) but proximmally is limited by pain. 5/5 elsewhere Sensory: Sensation is symmetric to light touch and temperature in the arms and legs. Deep Tendon Reflexes: 2+ and symmetric in the br and patellae.  Cerebellar: No clear ataxia, though limited in the right arm due to pain, and has some difficulty with the right leg, though not definite  ataxia.   I have reviewed labs in epic and the results pertinent to this consultation are: Elevated BG  I have reviewed the images obtained: CT negative, CTP - no penumbra, but there does seem to be signal dropout in the left MCA  On the reconstructions, I suspect a severe stenosis or occlusion.  Primary Diagnosis:  Cerebral infarction due to embolism of left middle cerebral artery  Secondary Diagnosis: Type 2 diabetes mellitus with hyperglycemia  Essential hypertension Alzheimer's disease  Impression: 77 year old female with a history of Alzheimer's who presents with acute onset aphasia which has rapidly improved, though there is still some  persistent difficulty with leg which.  Recommendations: 1. HgbA1c, fasting lipid panel 2. MRI  of the brain without contrast 3. Frequent neuro checks 4. Echocardiogram 5.  CTA head neck 6. Prophylactic therapy-Antiplatelet med: Aspirin - dose 325mg  PO or 300mg  PR 7. Risk factor modification 8. Telemetry monitoring 9. PT consult, OT consult, Speech consult 10.  SSI for DM  11.  I called the facility and there is no DNR in place, therefore she is full code.   Ritta SlotMcNeill Kaylyn Garrow, MD [facility] [phone]  If 7pm- 7am, please page neurology on call as listed in AMION. 11/15/2017  1:19 AM

## 2017-11-15 NOTE — Progress Notes (Signed)
[facility] regarding patient's sling. She said the patient was no longer required to wear it per the orthopedic physician. Sling was removed and stored with the patient's belongings.

## 2017-11-15 NOTE — ED Notes (Signed)
[facility] arrived  

## 2017-11-15 NOTE — Evaluation (Signed)
Clinical/Bedside Swallow Evaluation Patient Details  Name: Kristin Grant MRN: [id number] Date of Birth: 1940/12/26  Today's Date: 11/15/2017 Time: SLP Start Time (ACUTE ONLY): 0850 SLP Stop Time (ACUTE ONLY): 0910 SLP Time Calculation (min) (ACUTE ONLY): 20 min  Past Medical History:  Past Medical History:  Diagnosis Date  . Alzheimer disease   . Arthritis   . Coronary artery disease   . Diabetes mellitus   . Difficulty walking   . Hypertension   . Reflux   . Stroke [facility])    Past Surgical History:  Past Surgical History:  Procedure Laterality Date  . APPENDECTOMY    . FINGER DEBRIDEMENT     HPI:   Kristin Grant is a 77 y.o. female with a history of dementia, reflux, right should fracture in January who presented with acute mental status change.  She was found aphasic and drooling at the nursing facility, right visual deficits/left gaze preference. Received IVA TPA. Failed swallow screen in ED due to inability to cough. Referred for swallowing evaluation.   Assessment / Plan / Recommendation Clinical Impression  Pt presents without focal cranial nerve deficits; speech is fluent but minimally dysarthric, 100% intelligible. Oral motor examination is unremarkable, though pt does have difficulty initiating volitional cough, swallows due to cognitive impairments, xerostomia. Oropharyngeal swallow appears at bedside to be within functional limits with adequate airway protection. No overt signs of aspiration observed despite challenging with consecutive straw sips of thin liquids in excess of 3oz. Mastication is mildly prolonged due to dentition, however pt compensates by moistening solids with applesauce to aid bolus preparation and clearance. She does prefer her medications whole in applesauce; she reports this is how she takes them at her facility because it helps her to swallow larger tablets. Recommend regular diet with thin liquids, no further skilled ST services required for dysphagia.  Will s/o for swallow and follow-up at a later date for cognitive linguistic assessment.   SLP Visit Diagnosis: Dysphagia, unspecified (R13.10)    Aspiration Risk  Mild aspiration risk    Diet Recommendation Regular;Thin liquid   Liquid Administration via: Straw;Cup Medication Administration: Whole meds with puree Supervision: Patient able to self feed Compensations: Slow rate;Small sips/bites;Minimize environmental distractions    Other  Recommendations Oral Care Recommendations: Oral care BID   Follow up Recommendations Skilled Nursing facility      Frequency and Duration            Prognosis Prognosis for Safe Diet Advancement: Good      Swallow Study   General Date of Onset: 11/14/17 HPI:  Kida Digiulio is a 76 y.o. female with a history of dementia, reflux, right should fracture in January who presented with acute mental status change.  She was found aphasic and drooling at the nursing facility, right visual deficits/left gaze preference. Received IVA TPA. Failed swallow screen in ED due to inability to cough. Referred for swallowing evaluation. Type of Study: Bedside Swallow Evaluation Previous Swallow Assessment: none in chart Diet Prior to this Study: NPO Temperature Spikes Noted: No Respiratory Status: Room air History of Recent Intubation: No Behavior/Cognition: Alert;Cooperative;Pleasant mood Oral Cavity Assessment: Within Functional Limits Oral Care Completed by SLP: No Oral Cavity - Dentition: Dentures, top;Missing dentition;Poor condition(bottom teeth worn down; no bottom dentures) Vision: Functional for self-feeding Self-Feeding Abilities: Able to feed self Patient Positioning: Upright in bed Baseline Vocal Quality: Normal Volitional Cough: Cognitively unable to elicit Volitional Swallow: Unable to elicit(xerostomia)    Oral/Motor/Sensory Function Overall Oral Motor/Sensory Function: Within functional limits  Ice Chips Ice chips: Within functional  limits Presentation: Spoon   Thin Liquid Thin Liquid: Within functional limits Presentation: Cup;Self Fed;Straw    Nectar Thick Nectar Thick Liquid: Not tested   Honey Thick Honey Thick Liquid: Not tested   Puree Puree: Within functional limits Presentation: Self Fed;Spoon   Solid   GO   Solid: Within functional limits Presentation: Self Fed       Kristin BatonMary Beth Scharlene Catalina, MS, CCC-SLP Speech-Language Pathologist [phone]  Kristin LindauMary E Kyrianna Grant 11/15/2017,9:46 AM

## 2017-11-15 NOTE — Procedures (Signed)
  ELECTROENCEPHALOGRAM REPORT  Date of Study: 11/15/17  Patient's Name: Kristin Grant MRN: [id number] Date of Birth: 09-01-1940  Referring Provider: Dr Amada JupiterKirkpatrick  Clinical History: Kristin Grant is a 77 y.o. female with a history of dementia who presented with acute mental status change.  She was found aphasic and drooling at the nursing facility.  She was  Last seen well at 8 PM and found at 9:15 PM aphasic.  She was evaluated by tele-neurology and treated with IV TPA and has had some improvement though she is still mildly aphasic.  Head CT negative.   Medications: Scheduled Meds: . insulin aspart  0-15 Units Subcutaneous TID WC  . mouth rinse  15 mL Mouth Rinse BID  . pantoprazole (PROTONIX) IV  40 mg Intravenous QHS   Continuous Infusions: . sodium chloride    . sodium chloride 50 mL/hr (11/15/17 1400)  . niCARDipine     PRN Meds:.acetaminophen **OR** acetaminophen (TYLENOL) oral liquid 160 mg/5 mL **OR** acetaminophen, labetalol **AND** niCARDipine            Technical Summary: This is a standard 16 channel EEG recording performed according to the international 10-20 electrode system.  AP bipolar, transverse bipolar, and referential montages were obtained, and digitally reformatted as necessary.  Duration of tracing: 23:41  Description: In the awake state there is a 6-7 Hz theta rhythm seen from the posterior head regions in a symmetric fashion.  Background beta artifact is noted.  There is no well defined drowsiness or stage 2 sleep noted.  Neither hyperventilation or photic stimulation were performed.  EKG was monitored and noted to be sinus rhythym with an average heart rate of 84 bpm.  No epileptiform changes were noted.  Impression: This is an abnormal EEG due to mild background slowing seen throughout the tracing.  This is a very non-specific finding that can be seen with toxic, metabolic, diffuse, or multifocal structural processes, and would not be inconsistent  with the patients known history of dementia.  No definite epileptiform changes were noted.   A single EEG without epileptiform changes does not exclude the diagnosis of epilepsy. Clinical correlation advised.   Beryle Beamsobert Ulah Olmo, M.D. Neurology Cell [phone]

## 2017-11-15 NOTE — ED Notes (Signed)
Per Britta MccreedyBarbara with [facility]. [facility]

## 2017-11-15 NOTE — Progress Notes (Signed)
PT Cancellation Note  Patient Details Name: Kristin Grant MRN: [id number] DOB: 1940-12-14   Cancelled Treatment:    Reason Eval/Treat Not Completed: Patient not medically ready;Active bedrest order   Fabio AsaDevon J Karalyne Nusser 11/15/2017, 6:55 AM Charlotte Crumbevon Carry Weesner, PT DPT  Board Certified Neurologic Specialist [phone]

## 2017-11-15 NOTE — Progress Notes (Signed)
STROKE TEAM PROGRESS NOTE   SUBJECTIVE (INTERVAL HISTORY) Her RN is at the bedside.  Pt does not recall why she is in the hospital. She still has mild expressive aphasia with intermittent paraphasic error and word finding difficulty and mild dysarthria but not sure if this is her baseline. She is on right arm sling but according to note it seems she has been off it and repeat right arm X-ray showed healed fracture.    OBJECTIVE Temp:  [98 F (36.7 C)-98.1 F (36.7 C)] 98.1 F (36.7 C) (03/23 0800) Pulse Rate:  [65-81] 71 (03/23 0900) Cardiac Rhythm: Normal sinus rhythm (03/23 0800) Resp:  [14-34] 15 (03/23 0900) BP: (82-165)/(40-114) 150/62 (03/23 0900) SpO2:  [95 %-100 %] 99 % (03/23 0900) Weight:  [125 lb 7.1 oz (56.9 kg)-136 lb 7.4 oz (61.9 kg)] 125 lb 7.1 oz (56.9 kg) (03/23 0030)  CBC:  Recent Labs  Lab 11/14/17 2215 11/15/17 0305  WBC 8.1 7.5  NEUTROABS 5.3  --   HGB 13.0 13.1  HCT 41.3 41.2  MCV 85.2 84.9  PLT 126* 123*    Basic Metabolic Panel:  Recent Labs  Lab 11/14/17 2215  NA 140  K 3.7  CL 103  CO2 26  GLUCOSE 260*  BUN 17  CREATININE 0.79  CALCIUM 9.1    Lipid Panel:     Component Value Date/Time   CHOL 97 11/15/2017 0305   TRIG 75 11/15/2017 0305   HDL 35 (L) 11/15/2017 0305   CHOLHDL 2.8 11/15/2017 0305   VLDL 15 11/15/2017 0305   LDLCALC 47 11/15/2017 0305   LDLCALC 63 05/12/2017 1127   HgbA1c:  Lab Results  Component Value Date   HGBA1C 7.0 (H) 11/15/2017   Urine Drug Screen:     Component Value Date/Time   LABOPIA NONE DETECTED 11/14/2017 2237   COCAINSCRNUR NONE DETECTED 11/14/2017 2237   LABBENZ NONE DETECTED 11/14/2017 2237   AMPHETMU NONE DETECTED 11/14/2017 2237   THCU NONE DETECTED 11/14/2017 2237   LABBARB NONE DETECTED 11/14/2017 2237    Alcohol Level     Component Value Date/Time   ETH <10 11/14/2017 2214    IMAGING I have personally reviewed the radiological images below and agree with the radiology  interpretations.  Ct Angio Head W Or Wo Contrast Ct Angio Neck W Or Wo Contrast 11/15/2017 IMPRESSION:  1. Negative CTA for large vessel occlusion.  2. Atherosclerotic plaque involving the bilateral V4 segments with resultant moderate stenosis on the left and severe stenosis on the right. Left vertebral artery is dominant. Otherwise widely patent vertebrobasilar system.  3. Additional scattered mild atherosclerotic change for age. No other high-grade or correctable stenosis identified.   Ct Cerebral Perfusion W Contrast 11/14/2017 IMPRESSION:  No acute infarct or ischemic penumbra demonstrated by CT perfusion criteria.   Ct Head Code Stroke Wo Contrast 11/14/2017 IMPRESSION:  1. 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  2. ASPECTS is 10.   TTE - pending  EEG  This is an abnormal EEG due to mild background slowing seen throughout the tracing.  This is a very non-specific finding that can be seen with toxic, metabolic, diffuse, or multifocal structural processes, and would not be inconsistent with the patients known history of dementia.  No definite epileptiform changes were noted.   A single EEG without epileptiform changes does not exclude the diagnosis of epilepsy. Clinical correlation advised.   PHYSICAL EXAM Vitals:   11/15/17 0700 11/15/17 0730 11/15/17 0800 11/15/17 0900  BP: Marland Kitchen(!)  134/56 (!) 151/73 (!) 122/54 (!) 150/62  Pulse: 66 77 65 71  Resp: 17 (!) 22 18 15   Temp:   98.1 F (36.7 C)   TempSrc:   Oral   SpO2: 98% 99% 97% 99%  Weight:      Height:        Temp:  [98 F (36.7 C)-98.2 F (36.8 C)] 98.2 F (36.8 C) (03/23 1600) Pulse Rate:  [65-84] 82 (03/23 1400) Resp:  [13-34] 19 (03/23 1400) BP: (82-165)/(40-114) 127/52 (03/23 1400) SpO2:  [95 %-100 %] 99 % (03/23 1400) Weight:  [125 lb 7.1 oz (56.9 kg)-136 lb 7.4 oz (61.9 kg)] 125 lb 7.1 oz (56.9 kg) (03/23 0030)  General - Well nourished, well developed, in no apparent  distress.  Ophthalmologic - Fundi not visualized due to noncooperation.  Cardiovascular - Regular rate and rhythm.  Mental Status -  Level of arousal and orientation to month, place, and person were intact, but not orientated to year. Language including naming, repetition, comprehension was assessed and found intact, however, mild dysarthria and mild expressive aphasia with intermittent agitation and word finding difficulties.  Not sure if this is her baseline  Cranial Nerves II - XII - II - Visual field intact OU. III, IV, VI - Extraocular movements intact.  However, disconjugate gaze with left eye lazy eye, chronic V - Facial sensation intact bilaterally. VII - Facial movement intact bilaterally. VIII - Hearing & vestibular intact bilaterally. X - Palate elevates symmetrically. XI - Chin turning & shoulder shrug intact bilaterally. XII - Tongue protrusion intact.  Motor Strength - The patient's strength was normal in all extremities and pronator drift was absent.  Bulk was normal and fasciculations were absent.  Right arm on sling Motor Tone - Muscle tone was assessed at the neck and appendages and was normal.  Reflexes - The patient's reflexes were 1+ in all extremities and she had no pathological reflexes.  Sensory - Light touch, temperature/pinprick were assessed and were symmetrical.    Coordination - The patient had normal movements in the hands with no ataxia or dysmetria.  Tremor was absent.  Gait and Station - deferred   ASSESSMENT/PLAN Ms. Shawntee Mainwaring is a 77 y.o. female with history of Alzheimer's dementia, hypothyroidism, coronary artery disease, diabetes mellitus, hypertension, right shoulder fracture in January and previous stroke presenting with increased confusion and aphasia. She received IV tPA  Friday 11/14/2017 at 2300 hrs.  Suspected Stroke s/p tPA:  MRI pending  Resultant mild dysarthria and expressive aphasia, not sure whether this is baseline  CT head -  no acute abnormality  MRI head - pending  CTA head and neck bilateral V4 stenosis  CTP - no infarct core or penumbra  EEG - mild background slowing, no seizure  2D Echo - pending  LDL - 47  HgbA1c - 7.0  VTE prophylaxis - SCDs Check puncture sites for bleeding or hematomas. Bleeding precautions Fall precautions Diet regular Room service appropriate? Yes; Fluid consistency: Thin  aspirin 81 mg daily and clopidogrel 75 mg daily prior to admission, now on No antithrombotic s/p tPA within 24 hours  Ongoing aggressive stroke risk factor management  Therapy recommendations:  pending  Disposition:  Pending  Hypertension  Stable  Permissive hypertension (OK if <180/105) but gradually normalize in 5-7 days  Long-term BP goal normotensive  Diabetes  HgbA1c 7.0, goal < 7.0  Uncontrolled  CBC monitoring  SSI  Other Stroke Risk Factors  Advanced age  Hx stroke/TIA  Coronary  artery disease  Other Active Problems  Mild thrombocytopenia -123 K  Alzheimer's dementia  Recent right arm fracture - healed - takeoff sling  Hospital day # 1  This patient is critically ill due to suspected stroke status post TPA and at significant risk of neurological worsening, death form recurrent stroke, hemorrhagic conversion, seizure. This patient's care requires constant monitoring of vital signs, hemodynamics, respiratory and cardiac monitoring, review of multiple databases, neurological assessment, discussion with family, other specialists and medical decision making of high complexity. I spent 35 minutes of neurocritical care time in the care of this patient.  Marvel Plan, MD PhD Stroke Neurology 11/15/2017 4:25 PM   To contact Stroke Continuity provider, please refer to [email or web address]. After hours, contact General Neurology

## 2017-11-15 NOTE — Progress Notes (Signed)
EEG complete - results pending 

## 2017-11-15 NOTE — Progress Notes (Signed)
Orthopedic Tech Progress Note Patient Details:  Kristin Grant [birth year] [id number]  Ortho Devices Type of Ortho Device: Arm sling Ortho Device/Splint Location: rue Ortho Device/Splint Interventions: Application   Post Interventions Patient Tolerated: Well Instructions Provided: Care of device   Nikki DomCrawford, Musab Wingard 11/15/2017, 8:59 AM

## 2017-11-16 ENCOUNTER — Inpatient Hospital Stay (HOSPITAL_COMMUNITY): Payer: Medicare Other

## 2017-11-16 ENCOUNTER — Other Ambulatory Visit: Payer: Self-pay

## 2017-11-16 DIAGNOSIS — F0280 Dementia in other diseases classified elsewhere without behavioral disturbance: Secondary | ICD-10-CM

## 2017-11-16 DIAGNOSIS — Z794 Long term (current) use of insulin: Secondary | ICD-10-CM

## 2017-11-16 DIAGNOSIS — E785 Hyperlipidemia, unspecified: Secondary | ICD-10-CM

## 2017-11-16 DIAGNOSIS — G301 Alzheimer's disease with late onset: Secondary | ICD-10-CM

## 2017-11-16 LAB — GLUCOSE, CAPILLARY
Glucose-Capillary: 74 mg/dL (ref 65–99)
Glucose-Capillary: 223 mg/dL — ABNORMAL HIGH (ref 65–99)
Glucose-Capillary: 192 mg/dL — ABNORMAL HIGH (ref 65–99)
Glucose-Capillary: 302 mg/dL — ABNORMAL HIGH (ref 65–99)

## 2017-11-16 MED ORDER — LEVOTHYROXINE SODIUM 50 MCG PO TABS
50.0000 ug | ORAL_TABLET | Freq: Every day | ORAL | Status: DC
  Filled 2017-11-16: qty 1

## 2017-11-16 MED ORDER — METOPROLOL TARTRATE 12.5 MG HALF TABLET
12.5000 mg | ORAL_TABLET | Freq: Once | ORAL | Status: AC
  Administered 2017-11-16: 12.5 mg via ORAL
  Filled 2017-11-16: qty 1

## 2017-11-16 MED ORDER — PANTOPRAZOLE SODIUM 40 MG PO TBEC
40.0000 mg | DELAYED_RELEASE_TABLET | Freq: Every day | ORAL | Status: DC
  Administered 2017-11-16 – 2017-11-17 (×2): 40 mg via ORAL
  Filled 2017-11-16 (×2): qty 1

## 2017-11-16 MED ORDER — ASPIRIN EC 81 MG PO TBEC
81.0000 mg | DELAYED_RELEASE_TABLET | Freq: Every day | ORAL | Status: DC
  Administered 2017-11-16 – 2017-11-17 (×2): 81 mg via ORAL
  Filled 2017-11-16 (×2): qty 1

## 2017-11-16 MED ORDER — MIRTAZAPINE 15 MG PO TABS
15.0000 mg | ORAL_TABLET | Freq: Every day | ORAL | Status: DC
  Administered 2017-11-16: 15 mg via ORAL
  Filled 2017-11-16: qty 1

## 2017-11-16 MED ORDER — ACETAMINOPHEN 325 MG PO TABS: 650.0000 mg | ORAL_TABLET | Freq: Four times a day (QID) | ORAL | Status: DC | PRN

## 2017-11-16 MED ORDER — CLOPIDOGREL BISULFATE 75 MG PO TABS
75.0000 mg | ORAL_TABLET | Freq: Every day | ORAL | Status: DC
  Administered 2017-11-16 – 2017-11-17 (×2): 75 mg via ORAL
  Filled 2017-11-16 (×2): qty 1

## 2017-11-16 MED ORDER — LABETALOL HCL 5 MG/ML IV SOLN: 10.0000 mg | INTRAVENOUS | Status: DC | PRN

## 2017-11-16 NOTE — Evaluation (Signed)
Occupational Therapy Evaluation and Discharge Patient Details Name: Kristin Grant MRN: [id number] DOB: [birth year] Today's Date: 11/16/2017    History of Present Illness 77 y.o. female with history of Alzheimer's dementia, hypothyroidism, coronary artery disease, diabetes mellitus, hypertension, right shoulder fracture in January and previous stroke presenting with increased confusion and aphasia. She received IV tPA  Friday 11/14/2017 at 2300 hrs   Clinical Impression   Pt reports she was independent with ADL PTA; unsure of accuracy of PLOF information. Currently pt overall min guard-min assist for ADL and functional mobility. Anticipate pt is at or close to her baseline functionally and cognitively. Pt from SNF with plans to return to SNF upon d/c; will defer all further OT needs to next venue of care. Please re-consult if needs change. Thank you for this referral.    Follow Up Recommendations  SNF;Supervision/Assistance - 24 hour    Equipment Recommendations  None recommended by OT    Recommendations for Other Services       Precautions / Restrictions Precautions Precautions: Fall Restrictions Weight Bearing Restrictions: No      Mobility Bed Mobility Overal bed mobility: Needs Assistance Bed Mobility: Rolling;Sidelying to Sit Rolling: Min assist Sidelying to sit: Min assist       General bed mobility comments: min assist for safety, increased time to perform.  Transfers Overall transfer level: Needs assistance Equipment used: Rolling walker (2 wheeled) Transfers: Sit to/from Stand Sit to Stand: Min assist         General transfer comment: Min assist;min assist for stability during power to stand. Noted pain in RUE     Balance Overall balance assessment: Needs assistance   Sitting balance-Leahy Scale: Good Sitting balance - Comments: able to perform dynamic sitting balance activities for reaching to adjust socks   Standing balance support: Bilateral upper  extremity supported;During functional activity Standing balance-Leahy Scale: Fair Standing balance comment: able to release UE support briefly, do not feel patient could accept challenge                           ADL either performed or assessed with clinical judgement   ADL Overall ADL's : Needs assistance/impaired Eating/Feeding: Set up;Sitting   Grooming: Min guard;Standing;Wash/dry hands   Upper Body Bathing: Minimal assistance;Sitting   Lower Body Bathing: Minimal assistance;Sit to/from stand   Upper Body Dressing : Minimal assistance;Sitting   Lower Body Dressing: Minimal assistance;Sit to/from stand   Toilet Transfer: Min guard;Ambulation;Comfort height toilet;RW           Functional mobility during ADLs: Min guard;Rolling walker General ADL Comments: Anticipate pt is at or close to her functional baseline     Vision   Additional Comments: Difficult to assess due to impaired cognition. WFL in functional context. R eye deficits noted; anticipate present PTA      Perception     Praxis      Pertinent Vitals/Pain Pain Assessment: Faces Faces Pain Scale: Hurts even more Pain Location: right shoulder pain with activity Pain Descriptors / Indicators: Sore;Grimacing Pain Intervention(s): Monitored during session;Limited activity within patient's tolerance     Hand Dominance Right   Extremity/Trunk Assessment Upper Extremity Assessment Upper Extremity Assessment: Generalized weakness;RUE deficits/detail RUE Deficits / Details: Limited shoulder active flexion; hx of recent R arm fx RUE: Unable to fully assess due to pain   Lower Extremity Assessment Lower Extremity Assessment: Defer to PT evaluation   Cervical / Trunk Assessment Cervical / Trunk Assessment: Kyphotic  Communication Communication Communication: No difficulties   Cognition Arousal/Alertness: Awake/alert Behavior During Therapy: WFL for tasks assessed/performed Overall Cognitive  Status: History of cognitive impairments - at baseline                                 General Comments: patient following commands and pleasent/appropriate throughout session   General Comments       Exercises     Shoulder Instructions      Home Living Family/patient expects to be discharged to:: Skilled nursing facility                                        Prior Functioning/Environment Level of Independence: Needs assistance  Gait / Transfers Assistance Needed: used a RW and presumed supervision for tasks and mobility ADL's / Homemaking Assistance Needed: Pt reporting she was independent with ADL            OT Problem List: Decreased strength;Decreased activity tolerance;Impaired balance (sitting and/or standing);Decreased cognition;Decreased knowledge of use of DME or AE      OT Treatment/Interventions:      OT Goals(Current goals can be found in the care plan section) Acute Rehab OT Goals Patient Stated Goal: none stated OT Goal Formulation: All assessment and education complete, DC therapy  OT Frequency:     Barriers to D/C:            Co-evaluation PT/OT/SLP Co-Evaluation/Treatment: Yes Reason for Co-Treatment: Necessary to address cognition/behavior during functional activity;To address functional/ADL transfers PT goals addressed during session: Mobility/safety with mobility;Balance OT goals addressed during session: ADL's and self-care      AM-PAC PT "6 Clicks" Daily Activity     Outcome Measure Help from another person eating meals?: A Little Help from another person taking care of personal grooming?: A Little Help from another person toileting, which includes using toliet, bedpan, or urinal?: A Little Help from another person bathing (including washing, rinsing, drying)?: A Little Help from another person to put on and taking off regular upper body clothing?: A Little Help from another person to put on and taking off  regular lower body clothing?: A Little 6 Click Score: 18   End of Session Equipment Utilized During Treatment: Rolling walker Nurse Communication: Mobility status  Activity Tolerance: Patient tolerated treatment well Patient left: in chair;with call bell/phone within reach;with chair alarm set  OT Visit Diagnosis: Unsteadiness on feet (R26.81);Muscle weakness (generalized) (M62.81)                Time: [id number] OT Time Calculation (min): 15 min Charges:  OT General Charges $OT Visit: 1 Visit OT Evaluation $OT Eval Moderate Complexity: 1 Mod G-Codes:     Brenetta Penny A. Brett Albinooffey, M.S., OTR/L Pager: [phone]  Gaye AlkenBailey A Shawnte Winton 11/16/2017, 10:11 AM

## 2017-11-16 NOTE — Discharge Summary (Addendum)
Stroke Discharge Summary  Patient ID: Kristin Grant   MRN: [id number]      DOB: 1941-03-01  Date of Admission: 11/14/2017 Date of Discharge: 11/17/2017  Attending Physician:  Rosalin Hawking, MD, Stroke MD Consultant(s):   None  Patient's PCP:  [name], MD  DISCHARGE DIAGNOSIS:  Suspected Stroke treated with tPA Principal Problem:   Stroke-like episode ([place]) s/p IV tPA Active Problems:   Dementia   Hypertension   Diabetes mellitus, type II ([place])   Suspected stroke patient last known to be well 3 to 4.5 hours ago   Coronary artery disease   Hx of completed stroke   Thrombocytopenia (H[facility])   Right arm fracture   Cerebral atrophy   Hypokalemia   Past Medical History:  Diagnosis Date  . Alzheimer disease   . Arthritis   . Coronary artery disease   . Diabetes mellitus   . Difficulty walking   . Hypertension   . Reflux   . Stroke [facility])    Past Surgical History:  Procedure Laterality Date  . APPENDECTOMY    . FINGER DEBRIDEMENT      Allergies as of 11/17/2017   No Known Allergies     Medication List    TAKE these medications   aspirin 81 MG tablet Take 81 mg by mouth daily.   Blood Glucose Monitor System w/Device Kit 1 Units by Does not apply route daily.   clopidogrel 75 MG tablet Commonly known as:  PLAVIX TAKE 1 TABLET BY MOUTH ONCE DAILY. What changed:    how much to take  how to take this  when to take this   DAILY VITE Tabs TAKE 1 TABLET BY MOUTH ONCE DAILY.   glimepiride 2 MG tablet Commonly known as:  AMARYL Take 1 tablet (2 mg total) by mouth daily at 12 noon. Take with Glimepiride 61m to total 329m What changed:    how much to take  additional instructions   Insulin Pen Needle 30G X 8 MM Misc Commonly known as:  NOVOFINE AUTOCOVER USE WITH INSULIN FOR ADMINISTRATION.   LANTUS SOLOSTAR 100 UNIT/ML Solostar Pen Generic drug:  Insulin Glargine INJECT 12 UNITS SUBCUTANEOUSLY AT BEDTIME. What changed:  See the new  instructions.   levothyroxine 50 MCG tablet Commonly known as:  SYNTHROID, LEVOTHROID TAKE 1 TABLET BY MOUTH ONCE DAILY. What changed:    how much to take  how to take this  when to take this   linagliptin 5 MG Tabs tablet Commonly known as:  TRADJENTA Take 1 tablet (5 mg total) by mouth daily.   lisinopril 40 MG tablet Commonly known as:  PRINIVIL,ZESTRIL TAKE 1 TABLET BY MOUTH ONCE DAILY. What changed:    how much to take  how to take this  when to take this   mirtazapine 15 MG tablet Commonly known as:  REMERON Take 1 tablet (15 mg total) by mouth at bedtime. For appetite   NUTRITIONAL SUPPLEMENT PO Take 1 Container by mouth 3 (three) times daily.   pantoprazole 40 MG tablet Commonly known as:  PROTONIX TAKE 1 TABLET BY MOUTH ONCE DAILY. What changed:    how much to take  how to take this  when to take this   potassium chloride SA 20 MEQ tablet Commonly known as:  K-DUR,KLOR-CON Take 1 tablet (20 mEq total) by mouth 2 (two) times daily.   propranolol 40 MG tablet Commonly known as:  INDERAL TAKE 1 TABLET BY MOUTH TWICE DAILY. What  changed:    how much to take  how to take this  when to take this   traMADol 50 MG tablet Commonly known as:  ULTRAM TAKE 1 TABLET BY MOUTH EVERY 6 HOURS AS NEEDED FOR PAIN. What changed:    how much to take  how to take this  when to take this  additional instructions       LABORATORY STUDIES CBC    Component Value Date/Time   WBC 7.5 11/17/2017 0347   RBC 4.88 11/17/2017 0347   HGB 13.2 11/17/2017 0347   HCT 41.8 11/17/2017 0347   PLT 118 (L) 11/17/2017 0347   MCV 85.7 11/17/2017 0347   MCH 27.0 11/17/2017 0347   MCHC 31.6 11/17/2017 0347   RDW 14.9 11/17/2017 0347   LYMPHSABS 2.0 11/14/2017 2215   MONOABS 0.5 11/14/2017 2215   EOSABS 0.2 11/14/2017 2215   BASOSABS 0.0 11/14/2017 2215   CMP    Component Value Date/Time   NA 141 11/17/2017 0347   K 3.2 (L) 11/17/2017 0347   CL 105  11/17/2017 0347   CO2 26 11/17/2017 0347   GLUCOSE 237 (H) 11/17/2017 0347   BUN 8 11/17/2017 0347   CREATININE 0.66 11/17/2017 0347   CREATININE 0.88 07/11/2017 1148   CALCIUM 8.7 (L) 11/17/2017 0347   PROT 6.4 (L) 11/14/2017 2215   ALBUMIN 3.7 11/14/2017 2215   AST 15 11/14/2017 2215   ALT 12 (L) 11/14/2017 2215   ALKPHOS 67 11/14/2017 2215   BILITOT 0.4 11/14/2017 2215   GFRNONAA >60 11/17/2017 0347   GFRAA >60 11/17/2017 0347   COAGS Lab Results  Component Value Date   INR 1.04 11/14/2017   INR 1.05 11/13/2015   Lipid Panel    Component Value Date/Time   CHOL 97 11/15/2017 0305   TRIG 75 11/15/2017 0305   HDL 35 (L) 11/15/2017 0305   CHOLHDL 2.8 11/15/2017 0305   VLDL 15 11/15/2017 0305   LDLCALC 47 11/15/2017 0305   LDLCALC 63 05/12/2017 1127   HgbA1C  Lab Results  Component Value Date   HGBA1C 7.0 (H) 11/15/2017   Urinalysis    Component Value Date/Time   COLORURINE YELLOW 11/14/2017 2237   APPEARANCEUR CLEAR 11/14/2017 2237   LABSPEC 1.018 11/14/2017 2237   PHURINE 6.0 11/14/2017 2237   GLUCOSEU 50 (A) 11/14/2017 2237   HGBUR NEGATIVE 11/14/2017 2237   BILIRUBINUR NEGATIVE 11/14/2017 2237   KETONESUR NEGATIVE 11/14/2017 2237   PROTEINUR NEGATIVE 11/14/2017 2237   UROBILINOGEN 0.2 10/11/2013 0634   NITRITE NEGATIVE 11/14/2017 2237   LEUKOCYTESUR MODERATE (A) 11/14/2017 2237   Urine Drug Screen     Component Value Date/Time   LABOPIA NONE DETECTED 11/14/2017 2237   COCAINSCRNUR NONE DETECTED 11/14/2017 2237   LABBENZ NONE DETECTED 11/14/2017 2237   AMPHETMU NONE DETECTED 11/14/2017 2237   THCU NONE DETECTED 11/14/2017 2237   LABBARB NONE DETECTED 11/14/2017 2237    Alcohol Level    Component Value Date/Time   ETH <10 11/14/2017 2214     SIGNIFICANT DIAGNOSTIC STUDIES Ct Head Code Stroke Wo Contrast 11/14/2017 IMPRESSION:  1. 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  2. ASPECTS is 10.   Ct Angio  Head W Or Wo Contrast Ct Angio Neck W Or Wo Contrast 11/15/2017 IMPRESSION:  1. Negative CTA for large vessel occlusion.  2. Atherosclerotic plaque involving the bilateral V4 segments with resultant moderate stenosis on the left and severe stenosis on the right. Left vertebral artery is dominant.  Otherwise widely patent vertebrobasilar system.  3. Additional scattered mild atherosclerotic change for age. No other high-grade or correctable stenosis identified.   Ct Cerebral Perfusion W Contrast 11/14/2017 IMPRESSION:  No acute infarct or ischemic penumbra demonstrated by CT perfusion criteria.   Mr Brain Wo Contrast 11/16/2017 IMPRESSION:  1. No acute infarct or hemorrhage.  2. Severe chronic small vessel ischemia.  3. 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   TTE  - Left ventricle: The cavity size was normal. Wall thickness was normal. Systolic function was normal. The estimated ejection fraction was in the range of 55% to 60%. Wall motion was normal; there were no regional wall motion abnormalities. Doppler parameters are consistent with abnormal left ventricular relaxation (grade 1 diastolic dysfunction). - Ventricular septum: Septal motion showed paradox. These changes are consistent with a left bundle branch block. - Mitral valve: Moderately calcified annulus. - Pericardium, extracardiac: A trivial pericardial effusion was identified posterior to the heart.  EEG  This is an abnormal EEG due tomildbackground slowing seen throughout the tracing. This is a very non-specific finding that can be seen with toxic, metabolic, diffuse, or multifocal structural processes, and would not be inconsistent with the patients known history of dementia. No definite epileptiform changes were noted. A single EEG without epileptiform changes does not exclude the diagnosis of epilepsy. Clinical correlation  advised.     HISTORY OF PRESENT ILLNESS Kristin Grant is a 77 y.o. female with a history of dementia who presented to [facility] with acute mental status change.  She was found aphasic and drooling at the nursing facility.  She was last seen well 11/14/2017 at 8 PM and found at 9:15 PM to be aphasic.   She had a right shoulder fracture in January she is currently being treated with physical therapy and sling.  Modified Rankin Scale: 2-Slight disability-UNABLE to perform all activities but does not need assistance She was evaluated by tele-neurology and treated with IV TPA. She was transferred to [facility] for post tPA care  and has had some improvement though she is still mildly aphasic.   HOSPITAL COURSE [name] is a 77 y.o. female with history of Alzheimer's dementia, hypothyroidism, coronary artery disease, diabetes mellitus, hypertension, right shoulder fracture in January and previous stroke presenting with increased confusion and aphasia. She received IV tPA  Friday 11/14/2017 at 2300 hrs. per tele-neurology.  Stroke-like symptoms s/p tPA:  MRI - no acute infarct or hemorrhage.   Resultant mild dysarthria, not sure whether this is baseline  CT head - no acute abnormality  MRI head - no acute infarct, but brain atrophy including midbrain atrophy  CTA head and neck bilateral V4 stenosis  CTP - no infarct core or penumbra  EEG - mild background slowing, no seizure  2D Echo - EF 55-60%. No source of embolus   LDL - 47  HgbA1c - 7.0  aspirin 81 mg daily and clopidogrel 75 mg daily prior to admission, resumed ASA and plavix. Continue home meds at d/c.  Ongoing aggressive stroke risk factor management  Therapy recommendations: SNF but pt close to baseline functioning, ok to return to [place] ([place]). Will do [facility].  Disposition:   Assisted Living Facility [facility])  Hypertension  Stable  BP goal  normotensive  Diabetes  HgbA1c 7.0, goal < 7.0  Uncontrolled  Hyperglycemia  Resume home meds on discharge  Close PCP follow  up  Glucose monitoring at NH  Other Stroke Risk Factors  Advanced age  Hx stroke/TIA  Coronary artery disease  Other Active Problems  Mild thrombocytopenia -123 K  Alzheimer's dementia  Hypothyroidism - resume synthroid  Recent right arm fracture - healed on X-ray - off sling  MRI midbrain atrophy - no sign of PSP so far  Hypokalemia - replacement ordered - 24mq x 3 doses   DISCHARGE EXAM  Blood pressure 130/63, pulse 79, temperature 98.4 F (36.9 C), temperature source Oral, resp. rate 17, height '5\' 2"'  (1.575 m), weight 58.6 kg (129 lb 3 oz), SpO2 98 %.   General - Well nourished, well developed, in no apparent distress.  Cardiovascular - Regular rate and rhythm.  Mental Status -  Level of arousal and orientation to month, place, and person were intact, but not orientated to year. Language including naming, repetition, comprehension was assessed and found intact, however, mild dysarthria.   Cranial Nerves II - XII - II - Visual field intact OU. III, IV, VI - Extraocular movements intact.  However, disconjugate gaze with left eye lazy eye, chronic, no vertical eye movement difficulty V - Facial sensation intact bilaterally. VII - Facial movement intact bilaterally. VIII - Hearing & vestibular intact bilaterally. X - Palate elevates symmetrically. XI - Chin turning & shoulder shrug intact bilaterally. XII - Tongue protrusion intact. Ecchymosis L sided of tongue  Motor Strength - The patient's strength was normal in all extremities and pronator drift was absent.  Bulk was normal and fasciculations were absent. Motor Tone - Muscle tone was assessed at the neck and appendages and was normal.  Sensory - Light touch, temperature/pinprick were assessed and were symmetrical.    Coordination - The patient had normal movements in  the hands with no ataxia or dysmetria.  Tremor was absent.  Gait and Station - deferred    Discharge Diet   regular consistency with thin liquids  DISCHARGE PLAN  [facility]:  [facility]  aspirin 81 mg daily and clopidogrel 75 mg daily for secondary stroke prevention.  Ongoing risk factor control by Primary Care Physician at time of discharge  [facility].  Follow-up in Clinic in 4 weeks, office to schedule an appointment.  40 minutes were spent preparing discharge.  [place] Pager information 11/17/2017 1:08 PM   I reviewed above note and agree with the assessment and plan. I have made any additions or clarifications directly to the above note. Pt was seen and examined.   Pt no acute change overnight. Son at bedside. I had long discussion with son, updated pt current condition, treatment plan and potential prognosis. He expressed understanding and appreciation. Glucose still high, will resume home meds. Close glucose monitoring at SNF. Will dc back to SNF today. Follow up in clinic in 4 weeks.   JRosalin Hawking MD PhD Stroke Neurology 11/17/2017 2:49 PM

## 2017-11-16 NOTE — Plan of Care (Signed)
  Problem: Education: Goal: Knowledge of General Education information will improve Outcome: Progressing   Problem: Health Behavior/Discharge Planning: Goal: Ability to manage health-related needs will improve Outcome: Progressing   Problem: Clinical Measurements: Goal: Ability to maintain clinical measurements within normal limits will improve Outcome: Progressing Goal: Will remain free from infection Outcome: Progressing Goal: Diagnostic test results will improve Outcome: Progressing Goal: Respiratory complications will improve Outcome: Progressing Goal: Cardiovascular complication will be avoided Outcome: Progressing   Problem: Activity: Goal: Risk for activity intolerance will decrease Outcome: Progressing   Problem: Nutrition: Goal: Adequate nutrition will be maintained Outcome: Progressing   Problem: Coping: Goal: Level of anxiety will decrease Outcome: Progressing   Problem: Elimination: Goal: Will not experience complications related to bowel motility Outcome: Progressing Goal: Will not experience complications related to urinary retention Outcome: Progressing   Problem: Pain Managment: Goal: General experience of comfort will improve Outcome: Progressing   Problem: Safety: Goal: Ability to remain free from injury will improve Outcome: Progressing   Problem: Skin Integrity: Goal: Risk for impaired skin integrity will decrease Outcome: Progressing   Problem: Education: Goal: Knowledge of disease or condition will improve Outcome: Progressing Goal: Knowledge of secondary prevention will improve Outcome: Progressing Goal: Knowledge of patient specific risk factors addressed and post discharge goals established will improve Outcome: Progressing   Problem: Coping: Goal: Will verbalize positive feelings about self Outcome: Progressing Goal: Will identify appropriate support needs Outcome: Progressing   Problem: Health Behavior/Discharge  Planning: Goal: Ability to manage health-related needs will improve Outcome: Progressing   Problem: Self-Care: Goal: Ability to participate in self-care as condition permits will improve Outcome: Progressing Goal: Verbalization of feelings and concerns over difficulty with self-care will improve Outcome: Progressing Goal: Ability to communicate needs accurately will improve Outcome: Progressing   Problem: Nutrition: Goal: Risk of aspiration will decrease Outcome: Progressing Goal: Dietary intake will improve Outcome: Progressing   Problem: Ischemic Stroke/TIA Tissue Perfusion: Goal: Complications of ischemic stroke/TIA will be minimized Outcome: Progressing   

## 2017-11-16 NOTE — Evaluation (Signed)
Physical Therapy Evaluation Patient Details Name: Kristin Grant MRN: [id number] DOB: 17-Oct-1940 Today's Date: 11/16/2017   History of Present Illness  77 y.o. female with history of Alzheimer's dementia, hypothyroidism, coronary artery disease, diabetes mellitus, hypertension, right shoulder fracture in January and previous stroke presenting with increased confusion and aphasia. She received IV tPA  Friday 11/14/2017 at 2300 hrs  Clinical Impression  Orders received for PT evaluation. Patient demonstrates deficits in functional mobility as indicated below. Will benefit from continued skilled PT to address deficits and maximize function. Will see as indicated and progress as tolerated.  Recommend return to SNF upon acute discharge.     Follow Up Recommendations SNF;Supervision/Assistance - 24 hour    Equipment Recommendations  None recommended by PT    Recommendations for Other Services       Precautions / Restrictions Precautions Precautions: Fall Restrictions Weight Bearing Restrictions: No      Mobility  Bed Mobility Overal bed mobility: Needs Assistance Bed Mobility: Rolling;Sidelying to Sit Rolling: Min assist Sidelying to sit: Min assist       General bed mobility comments: min assist for safety, increased time to perform.  Transfers Overall transfer level: Needs assistance Equipment used: Rolling walker (2 wheeled) Transfers: Sit to/from Stand Sit to Stand: Min assist         General transfer comment: Min assist;min assist for stability during power to stand. Noted pain in RUE   Ambulation/Gait Ambulation/Gait assistance: Min guard Ambulation Distance (Feet): 90 Feet Assistive device: Rolling walker (2 wheeled) Gait Pattern/deviations: Step-through pattern;Decreased stride length;Shuffle;Narrow base of support Gait velocity: decreased Gait velocity interpretation: Below normal speed for age/gender General Gait Details: patient with some instability but no  over LOB noted  Stairs            Wheelchair Mobility    Modified Rankin (Stroke Patients Only) Modified Rankin (Stroke Patients Only) Pre-Morbid Rankin Score: No symptoms Modified Rankin: Moderate disability     Balance Overall balance assessment: Needs assistance   Sitting balance-Leahy Scale: Good Sitting balance - Comments: able to perform dynamic sitting balance activities for reaching to adjust socks   Standing balance support: Bilateral upper extremity supported;During functional activity Standing balance-Leahy Scale: Fair Standing balance comment: able to release UE support briefly, do not feel patient could accept challenge                             Pertinent Vitals/Pain Pain Assessment: Faces Faces Pain Scale: Hurts even more Pain Location: right shoulder pain with activity Pain Descriptors / Indicators: Sore;Grimacing Pain Intervention(s): Monitored during session    Home Living Family/patient expects to be discharged to:: Skilled nursing facility                      Prior Function Level of Independence: Needs assistance   Gait / Transfers Assistance Needed: used a RW and presumed supervision for tasks and mobility           Hand Dominance   Dominant Hand: Right    Extremity/Trunk Assessment   Upper Extremity Assessment Upper Extremity Assessment: Generalized weakness    Lower Extremity Assessment Lower Extremity Assessment: Generalized weakness    Cervical / Trunk Assessment Cervical / Trunk Assessment: Kyphotic  Communication      Cognition Arousal/Alertness: Awake/alert Behavior During Therapy: WFL for tasks assessed/performed Overall Cognitive Status: History of cognitive impairments - at baseline  General Comments: patient following commands and pleasent/appropriate throughout session      General Comments      Exercises     Assessment/Plan    PT  Assessment Patient needs continued PT services  PT Problem List Decreased balance;Decreased mobility;Decreased strength       PT Treatment Interventions DME instruction;Gait training;Functional mobility training;Therapeutic activities;Therapeutic exercise;Balance training;Patient/family education    PT Goals (Current goals can be found in the Care Plan section)  Acute Rehab PT Goals Patient Stated Goal: none stated PT Goal Formulation: With patient Time For Goal Achievement: 11/30/17 Potential to Achieve Goals: Good    Frequency Min 3X/week   Barriers to discharge        Co-evaluation PT/OT/SLP Co-Evaluation/Treatment: Yes Reason for Co-Treatment: Necessary to address cognition/behavior during functional activity PT goals addressed during session: Mobility/safety with mobility;Balance OT goals addressed during session: ADL's and self-care       AM-PAC PT "6 Clicks" Daily Activity  Outcome Measure Difficulty turning over in bed (including adjusting bedclothes, sheets and blankets)?: Unable Difficulty moving from lying on back to sitting on the side of the bed? : Unable Difficulty sitting down on and standing up from a chair with arms (e.g., wheelchair, bedside commode, etc,.)?: Unable Help needed moving to and from a bed to chair (including a wheelchair)?: A Little Help needed walking in hospital room?: A Little Help needed climbing 3-5 steps with a railing? : A Lot 6 Click Score: 11    End of Session   Activity Tolerance: Patient tolerated treatment well Patient left: in chair;with call bell/phone within reach;with chair alarm set Nurse Communication: Mobility status PT Visit Diagnosis: Unsteadiness on feet (R26.81)    Time: 1610-9604 PT Time Calculation (min) (ACUTE ONLY): 20 min   Charges:   PT Evaluation $PT Eval Moderate Complexity: 1 Mod     PT G Codes:        Charlotte Crumb, PT DPT  Board Certified Neurologic Specialist [phone]   Fabio Asa 11/16/2017, 8:32 AM

## 2017-11-16 NOTE — Plan of Care (Signed)
Pt tx from ICU. POC discussed w/ pt and son, denies questions/concerns at this time.

## 2017-11-16 NOTE — Progress Notes (Addendum)
STROKE TEAM PROGRESS NOTE   SUBJECTIVE (INTERVAL HISTORY) Her RN is at the bedside.  Pt sitting in chair comfortably. 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 However, on exam, pt has no vertical eye movement and no frequent falls at [facility]. No clinic sign of PSP.     OBJECTIVE Temp:  [98.2 F (36.8 C)-99.3 F (37.4 C)] 98.2 F (36.8 C) (03/24 0732) Pulse Rate:  [78-113] 87 (03/24 0800) Cardiac Rhythm: Normal sinus rhythm (03/24 0800) Resp:  [13-24] 20 (03/24 0800) BP: (118-166)/(49-127) 132/64 (03/24 0800) SpO2:  [84 %-100 %] 99 % (03/24 0800)  CBC:  Recent Labs  Lab 11/14/17 2215 11/15/17 0305  WBC 8.1 7.5  NEUTROABS 5.3  --   HGB 13.0 13.1  HCT 41.3 41.2  MCV 85.2 84.9  PLT 126* 123*    Basic Metabolic Panel:  Recent Labs  Lab 11/14/17 2215  NA 140  K 3.7  CL 103  CO2 26  GLUCOSE 260*  BUN 17  CREATININE 0.79  CALCIUM 9.1    Lipid Panel:     Component Value Date/Time   CHOL 97 11/15/2017 0305   TRIG 75 11/15/2017 0305   HDL 35 (L) 11/15/2017 0305   CHOLHDL 2.8 11/15/2017 0305   VLDL 15 11/15/2017 0305   LDLCALC 47 11/15/2017 0305   LDLCALC 63 05/12/2017 1127   HgbA1c:  Lab Results  Component Value Date   HGBA1C 7.0 (H) 11/15/2017   Urine Drug Screen:     Component Value Date/Time   LABOPIA NONE DETECTED 11/14/2017 2237   COCAINSCRNUR NONE DETECTED 11/14/2017 2237   LABBENZ NONE DETECTED 11/14/2017 2237   AMPHETMU NONE DETECTED 11/14/2017 2237   THCU NONE DETECTED 11/14/2017 2237   LABBARB NONE DETECTED 11/14/2017 2237    Alcohol Level     Component Value Date/Time   ETH <10 11/14/2017 2214    IMAGING I have personally reviewed the radiological images below and agree with the radiology interpretations.  Ct Angio Head W Or Wo Contrast Ct Angio Neck W Or Wo Contrast 11/15/2017 IMPRESSION:  1. Negative CTA for large vessel occlusion.  2. Atherosclerotic plaque involving the bilateral V4 segments with  resultant moderate stenosis on the left and severe stenosis on the right. Left vertebral artery is dominant. Otherwise widely patent vertebrobasilar system.  3. Additional scattered mild atherosclerotic change for age. No other high-grade or correctable stenosis identified.   Ct Cerebral Perfusion W Contrast 11/14/2017 IMPRESSION:  No acute infarct or ischemic penumbra demonstrated by CT perfusion criteria.   Ct Head Code Stroke Wo Contrast 11/14/2017 IMPRESSION:  1. 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  2. ASPECTS is 10.   TTE - pending  EEG  This is an abnormal EEG due to mild background slowing seen throughout the tracing.  This is a very non-specific finding that can be seen with toxic, metabolic, diffuse, or multifocal structural processes, and would not be inconsistent with the patients known history of dementia.  No definite epileptiform changes were noted.   A single EEG without epileptiform changes does not exclude the diagnosis of epilepsy. Clinical correlation advised.  Mr Brain Wo Contrast  Result Date: 11/16/2017 CLINICAL DATA:  24 hours post tPA administration for stroke. EXAM: MRI HEAD WITHOUT CONTRAST TECHNIQUE: Multiplanar, multiecho pulse sequences of the brain and surrounding structures were obtained without intravenous contrast. COMPARISON:  None. FINDINGS: Brain: The midline structures are normal. There is no acute infarct or acute hemorrhage. No  mass lesion, hydrocephalus, dural abnormality or extra-axial collection. Diffuse confluent hyperintense T2-weighted signal within the periventricular, deep and juxtacortical white matter, most commonly due to chronic ischemic microangiopathy. Concavity of the upper border of the midbrain. No chronic microhemorrhage or superficial siderosis. Vascular: Major intracranial arterial and venous sinus flow voids are preserved. Skull and upper cervical spine: Posterior right scalp dermoid is  unchanged. No focal marrow lesion. Sinuses/Orbits: Mild diffuse paranasal sinus mucosal thickening. No mastoid or middle ear effusion. Normal orbits. IMPRESSION: 1. No acute infarct or hemorrhage. 2. Severe chronic small vessel ischemia. 3. 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 Electronically Signed   By: Deatra RobinsonKevin  Herman M.D.   On: 11/16/2017 01:40    PHYSICAL EXAM Temp:  [98.2 F (36.8 C)-99.3 F (37.4 C)] 98.2 F (36.8 C) (03/24 0732) Pulse Rate:  [78-113] 87 (03/24 0800) Resp:  [13-24] 20 (03/24 0800) BP: (118-166)/(49-127) 132/64 (03/24 0800) SpO2:  [84 %-100 %] 99 % (03/24 0800)  General - Well nourished, well developed, in no apparent distress.  Ophthalmologic - Fundi not visualized due to noncooperation.  Cardiovascular - Regular rate and rhythm.  Mental Status -  Level of arousal and orientation to month, place, and person were intact, but not orientated to year. Language including naming, repetition, comprehension was assessed and found intact, however, mild dysarthria and mild expressive aphasia with intermittent agitation and word finding difficulties.  Not sure if this is her baseline  Cranial Nerves II - XII - II - Visual field intact OU. III, IV, VI - Extraocular movements intact.  However, disconjugate gaze with left eye lazy eye, chronic, no vertical eye movement difficulty V - Facial sensation intact bilaterally. VII - Facial movement intact bilaterally. VIII - Hearing & vestibular intact bilaterally. X - Palate elevates symmetrically. XI - Chin turning & shoulder shrug intact bilaterally. XII - Tongue protrusion intact.  Motor Strength - The patient's strength was normal in all extremities and pronator drift was absent.  Bulk was normal and fasciculations were absent. Motor Tone - Muscle tone was assessed at the neck and appendages and was normal.  Reflexes - The patient's  reflexes were 1+ in all extremities and she had no pathological reflexes.  Sensory - Light touch, temperature/pinprick were assessed and were symmetrical.    Coordination - The patient had normal movements in the hands with no ataxia or dysmetria.  Tremor was absent.  Gait and Station - deferred   ASSESSMENT/PLAN [name] is a 77 y.o. female with history of Alzheimer's dementia, hypothyroidism, coronary artery disease, diabetes mellitus, hypertension, right shoulder fracture in January and previous stroke presenting with increased confusion and aphasia. She received IV tPA  Friday 11/14/2017 at 2300 hrs.  Suspected Stroke s/p tPA:  MRI pending  Resultant mild dysarthria and expressive aphasia, not sure whether this is baseline  CT head - no acute abnormality  MRI head - no acute infarct, but brain atrophy including midbrain atrophy  CTA head and neck bilateral V4 stenosis  CTP - no infarct core or penumbra  EEG - mild background slowing, no seizure  2D Echo - pending  LDL - 47  HgbA1c - 7.0  VTE prophylaxis - SCDs Bleeding precautions Fall precautions Diet regular Room service appropriate? Yes; Fluid consistency: Thin  aspirin 81 mg daily and clopidogrel 75 mg daily prior to admission, now on ASA and plavix home meds resumed.   Ongoing aggressive stroke risk factor management  Therapy recommendations:  SNF  Disposition:  Pending  Hypertension  Stable  Permissive hypertension (OK if <180/105) but gradually normalize in 5-7 days  Long-term BP goal normotensive  Diabetes  HgbA1c 7.0, goal < 7.0  Uncontrolled  CBC monitoring  SSI  Other Stroke Risk Factors  Advanced age  Hx stroke/TIA  Coronary artery disease  Other Active Problems  Mild thrombocytopenia -123 K  Alzheimer's dementia  Hypothyroidism - resume synthroid  Recent right arm fracture - healed on X-ray - off sling  MRI midbrain atrophy - no sign of PSP so far  Hospital day #  2  This patient is critically ill due to suspected stroke status post TPA and at significant risk of neurological worsening, death form recurrent stroke, hemorrhagic conversion, seizure. This patient's care requires constant monitoring of vital signs, hemodynamics, respiratory and cardiac monitoring, review of multiple databases, neurological assessment, discussion with family, other specialists and medical decision making of high complexity. I spent 30 minutes of neurocritical care time in the care of this patient.  Marvel Plan, MD PhD Stroke Neurology 11/16/2017 9:49 AM   To contact Stroke Continuity provider, please refer to [email or web address]. After hours, contact General Neurology

## 2017-11-17 ENCOUNTER — Inpatient Hospital Stay (HOSPITAL_COMMUNITY): Payer: Medicare Other

## 2017-11-17 DIAGNOSIS — I2510 Atherosclerotic heart disease of native coronary artery without angina pectoris: Secondary | ICD-10-CM | POA: Diagnosis present

## 2017-11-17 DIAGNOSIS — G319 Degenerative disease of nervous system, unspecified: Principal | ICD-10-CM

## 2017-11-17 DIAGNOSIS — F0150 Vascular dementia without behavioral disturbance: Secondary | ICD-10-CM

## 2017-11-17 DIAGNOSIS — D696 Thrombocytopenia, unspecified: Secondary | ICD-10-CM | POA: Diagnosis present

## 2017-11-17 DIAGNOSIS — E119 Type 2 diabetes mellitus without complications: Secondary | ICD-10-CM

## 2017-11-17 DIAGNOSIS — I639 Cerebral infarction, unspecified: Secondary | ICD-10-CM

## 2017-11-17 DIAGNOSIS — Z8673 Personal history of transient ischemic attack (TIA), and cerebral infarction without residual deficits: Secondary | ICD-10-CM

## 2017-11-17 DIAGNOSIS — S42301A Unspecified fracture of shaft of humerus, right arm, initial encounter for closed fracture: Secondary | ICD-10-CM | POA: Diagnosis present

## 2017-11-17 DIAGNOSIS — E876 Hypokalemia: Secondary | ICD-10-CM | POA: Diagnosis not present

## 2017-11-17 DIAGNOSIS — I5030 Unspecified diastolic (congestive) heart failure: Secondary | ICD-10-CM

## 2017-11-17 LAB — BASIC METABOLIC PANEL
Anion gap: 10 (ref 5–15)
BUN: 8 mg/dL (ref 6–20)
CHLORIDE: 105 mmol/L (ref 101–111)
CO2: 26 mmol/L (ref 22–32)
Calcium: 8.7 mg/dL — ABNORMAL LOW (ref 8.9–10.3)
Creatinine, Ser: 0.66 mg/dL (ref 0.44–1.00)
GFR calc Af Amer: >60 mL/min (ref >60)
GLUCOSE: 237 mg/dL — AB (ref 65–99)
POTASSIUM: 3.2 mmol/L — AB (ref 3.5–5.1)
Sodium: 141 mmol/L (ref 135–145)

## 2017-11-17 LAB — CBC
HCT: 41.8 % (ref 36.0–46.0)
HEMOGLOBIN: 13.2 g/dL (ref 12.0–15.0)
MCH: 27 pg (ref 26.0–34.0)
MCHC: 31.6 g/dL (ref 30.0–36.0)
MCV: 85.7 fL (ref 78.0–100.0)
PLATELETS: 118 10*3/uL — AB (ref 150–400)
RBC: 4.88 MIL/uL (ref 3.87–5.11)
RDW: 14.9 % (ref 11.5–15.5)
WBC: 7.5 10*3/uL (ref 4.0–10.5)

## 2017-11-17 LAB — ECHOCARDIOGRAM COMPLETE
Height: 62 in
Weight: 2067.03 oz

## 2017-11-17 LAB — GLUCOSE, CAPILLARY
GLUCOSE-CAPILLARY: 242 mg/dL — AB (ref 65–99)
Glucose-Capillary: 172 mg/dL — ABNORMAL HIGH (ref 65–99)
Glucose-Capillary: 239 mg/dL — ABNORMAL HIGH (ref 65–99)

## 2017-11-17 MED ORDER — POTASSIUM CHLORIDE CRYS ER 20 MEQ PO TBCR
20.0000 meq | EXTENDED_RELEASE_TABLET | Freq: Two times a day (BID) | ORAL | Status: DC
  Administered 2017-11-17: 20 meq via ORAL
  Filled 2017-11-17: qty 1

## 2017-11-17 MED ORDER — POTASSIUM CHLORIDE CRYS ER 20 MEQ PO TBCR: 20.0000 meq | EXTENDED_RELEASE_TABLET | Freq: Two times a day (BID) | ORAL | 0 refills | Status: DC

## 2017-11-17 NOTE — NC FL2 (Signed)
[place] MEDICAID FL2 LEVEL OF CARE SCREENING TOOL     IDENTIFICATION  Patient Name: Kristin Grant Birthdate: February 26, 1941 Sex: female Admission Date (Current Location): 11/14/2017  [facility] and [place] Number:  [employer] and Address:  [facility] [place], [address] [place], [place] [address]      Provider Number: [id number]  Attending Physician Name and Address:  Marvel Plan, MD  Relative Name and Phone Number:       Current Level of Care: Hospital Recommended Level of Care: Assisted Living Facility Prior Approval Number:    Date Approved/Denied:   PASRR Number:    Discharge Plan: Other (Comment)(ALF)    Current Diagnoses: Patient Active Problem List   Diagnosis Date Noted  . Stroke (cerebrum) (HCC) 11/15/2017  . Suspected stroke patient last known to be well 3 to 4.5 hours ago   . Closed fracture of proximal end of right humerus 09/18/17 10/16/2017  . Osteoporosis 01/09/2017  . Loss of weight 01/12/2016  . CVA (cerebral vascular accident) (HCC) 11/22/2015  . Diabetes mellitus, type II (HCC) 06/22/2014  . Protein-calorie malnutrition (HCC) 12/15/2013  . Fall 08/17/2013  . Back pain 08/17/2013  . Pressure ulcer, stage 1 07/06/2013  . Abnormal development of nail 03/28/2013  . Gait abnormality 12/10/2011  . Dementia 11/21/2011  . Hypertension 11/21/2011  . Hypothyroidism 11/21/2011    Orientation RESPIRATION BLADDER Height & Weight     Self, Time, Place  Normal Continent Weight: 129 lb 3 oz (58.6 kg) Height:  5\' 2"  (157.5 cm)  BEHAVIORAL SYMPTOMS/MOOD NEUROLOGICAL BOWEL NUTRITION STATUS      Continent Diet(regular)  AMBULATORY STATUS COMMUNICATION OF NEEDS Skin   Limited Assist Verbally Normal                       Personal Care Assistance Level of Assistance  Bathing, Feeding, Dressing Bathing Assistance: Limited assistance Feeding assistance: Limited assistance Dressing Assistance: Limited assistance      Functional Limitations Info  Sight, Hearing, Speech Sight Info: Adequate Hearing Info: Adequate Speech Info: Impaired(dysarthria)    SPECIAL CARE FACTORS FREQUENCY  PT (By licensed PT), OT (By licensed OT)     PT Frequency: 3x/wk OT Frequency: 3x/wk            Contractures Contractures Info: Not present    Additional Factors Info  Code Status, Allergies, Psychotropic, Insulin Sliding Scale Code Status Info: Full Allergies Info: NKA Psychotropic Info: Remeron 15mg  daily at bedtime Insulin Sliding Scale Info: 0-15 units 3x/day with meals       Current Medications (11/17/2017):  This is the current hospital active medication list Current Facility-Administered Medications  Medication Dose Route Frequency Provider Last Rate Last Dose  . acetaminophen (TYLENOL) tablet 650 mg  650 mg Oral Q6H PRN Rinehuls, Kinnie Scales, PA-C      . aspirin EC tablet 81 mg  81 mg Oral Daily Rinehuls, David L, PA-C   81 mg at 11/16/17 1017  . clopidogrel (PLAVIX) tablet 75 mg  75 mg Oral Daily Rinehuls, David L, PA-C   75 mg at 11/16/17 1016  . insulin aspart (novoLOG) injection 0-15 Units  0-15 Units Subcutaneous TID WC Rinehuls, David L, PA-C   3 Units at 11/16/17 1751  . labetalol (NORMODYNE,TRANDATE) injection 10-20 mg  10-20 mg Intravenous Q2H PRN Rinehuls, Kinnie Scales, PA-C      . levothyroxine (SYNTHROID, LEVOTHROID) tablet 50 mcg  50 mcg Oral QAC breakfast Rinehuls, David L, PA-C      .  MEDLINE mouth rinse  15 mL Mouth Rinse BID Rinehuls, David L, PA-C   15 mL at 11/16/17 0950  . mirtazapine (REMERON) tablet 15 mg  15 mg Oral QHS Rinehuls, David L, PA-C   15 mg at 11/16/17 2144  . pantoprazole (PROTONIX) EC tablet 40 mg  40 mg Oral Daily Rinehuls, David L, PA-C   40 mg at 11/16/17 1016     Discharge Medications: Please see discharge summary for a list of discharge medications.  Relevant Imaging Results:  Relevant Lab Results:   Additional Information SS#: [id number]  Baldemar LenisElizabeth M Milanni Ayub,  LCSW

## 2017-11-17 NOTE — Clinical Social Work Note (Signed)
Clinical Social Work Assessment  Patient Details  Name: Kristin Grant MRN: [id number] Date of Birth: [birth year]  Date of referral:  11/17/17               Reason for consult:  Facility Placement                Permission sought to share information with:  Facility Sport and exercise psychologist, Family Supports Permission granted to share information::  Yes, Verbal Permission Granted  Name::     Engineer, maintenance (IT)::  [facility]   Relationship::  Son  Sport and exercise psychologist Information:     Housing/Transportation Living arrangements for the past 2 months:  [place] of Information:  Patient, Adult Children Patient Interpreter Needed:  None Criminal Activity/Legal Involvement Pertinent to Current Situation/Hospitalization:  No - Comment as needed Significant Relationships:  Adult Children Lives with:  Self, Facility Resident Do you feel safe going back to the place where you live?  Yes Need for family participation in patient care:  Yes (Comment)(patient not fully oriented)  Care giving concerns:  Patient has been resident at [facility] long term care and has no concerns about care received.   Social Worker assessment / plan:  CSW met with patient and patient's son at bedside to discuss return to ALF. CSW discussed transport needs. CSW contacted facility to discuss patient's return and facility needs. CSW to continue to follow.  Employment status:  Retired Forensic scientist:  Medicare PT Recommendations:  [place] / Referral to community resources:     Patient/Family's Response to care:  Patient agreeable to return to ALF.  Patient/Family's Understanding of and Emotional Response to Diagnosis, Current Treatment, and Prognosis:  Patient is excited to go back and be able to do more stuff than just lay in the bed. Patient discussed how she's glad to have her son here for a little bit to check on her, as well, and is upset that he'll be going back home  today.  Emotional Assessment Appearance:  Appears stated age Attitude/Demeanor/Rapport:  Engaged Affect (typically observed):  Pleasant Orientation:  Oriented to Self, Oriented to Place, Oriented to  Time Alcohol / Substance use:  Not Applicable Psych involvement (Current and /or in the community):  No (Comment)  Discharge Needs  Concerns to be addressed:  Care Coordination Readmission within the last 30 days:  No Current discharge risk:  Dependent with Mobility Barriers to Discharge:  Continued Medical Work up   [employer], [place] 11/17/2017, 11:51 AM

## 2017-11-17 NOTE — Care Management Note (Signed)
Case Management Note  Patient Details  Name: Henrietta Dinelsene Gearing MRN: [id number] Date of Birth: [birth year]  Subjective/Objective:                    Action/Plan: Pt [facility]. CM notified [facility] and faxed them the needed information.  CSW has arranged other details with [facility].  No further needs per CM.    Expected Discharge Date:  11/17/17               Expected Discharge Plan:  Assisted Living / Rest Home  In-House Referral:  Clinical Social Work  Discharge planning Services  CM Consult  Post Acute Care Choice:  Home Health Choice offered to:  (facility)  DME Arranged:    DME Agency:     HH Arranged:  PT HH Agency:  [facility]  Status of Service:  Completed, signed off  If discussed at Long Length of Stay Meetings, dates discussed:    Additional Comments:  Kermit BaloKelli F Daryle Boyington, RN 11/17/2017, 1:29 PM

## 2017-11-17 NOTE — Progress Notes (Signed)
2D Echocardiogram has been performed.  Kristin PartridgeBrooke S Mariam Grant 11/17/2017, 9:44 AM

## 2017-11-17 NOTE — Progress Notes (Signed)
Discharge to: [facility] Anticipated discharge date: 11/17/17 Family notified: Laneta SimmersYes, Rick, by phone Transportation by: [facility]  Report #: [phone]  CSW signing off.  Blenda Nicelylizabeth Mohannad Olivero LCSW [phone]

## 2017-11-17 NOTE — Evaluation (Signed)
Speech Language Pathology Evaluation Patient Details Name: Kristin Grant MRN: [id number] DOB: 11-23-1940 Today's Date: 11/17/2017 Time: [id number] SLP Time Calculation (min) (ACUTE ONLY): 15 min  Problem List:  Patient Active Problem List   Diagnosis Date Noted  . Stroke (cerebrum) (HCC) 11/15/2017  . Suspected stroke patient last known to be well 3 to 4.5 hours ago   . Closed fracture of proximal end of right humerus 09/18/17 10/16/2017  . Osteoporosis 01/09/2017  . Loss of weight 01/12/2016  . CVA (cerebral vascular accident) (HCC) 11/22/2015  . Diabetes mellitus, type II (HCC) 06/22/2014  . Protein-calorie malnutrition (HCC) 12/15/2013  . Fall 08/17/2013  . Back pain 08/17/2013  . Pressure ulcer, stage 1 07/06/2013  . Abnormal development of nail 03/28/2013  . Gait abnormality 12/10/2011  . Dementia 11/21/2011  . Hypertension 11/21/2011  . Hypothyroidism 11/21/2011   Past Medical History:  Past Medical History:  Diagnosis Date  . Alzheimer disease   . Arthritis   . Coronary artery disease   . Diabetes mellitus   . Difficulty walking   . Hypertension   . Reflux   . Stroke [facility](HCC)    Past Surgical History:  Past Surgical History:  Procedure Laterality Date  . APPENDECTOMY    . FINGER DEBRIDEMENT     HPI:  77 y.o. female with history of Alzheimer's dementia, hypothyroidism, coronary artery disease, diabetes mellitus, hypertension, right shoulder fracture in January and previous stroke presenting with increased confusion and aphasia. She received IV tPA  Friday 11/14/2017 at 2300 hrs   Assessment / Plan / Recommendation Clinical Impression  Son was present in room during evaluation and was very helpful with determining patients baseline cognitive and language skills. She was diagnosed with dementia for a while (per son) and has been living in a SNF for 1 year. He feels her cognition, speech and language skills are at baseline. However, he reported she has been talking more  about relatives who have passed during the day (this happened during our session as well, followed by tears). He reports she gets very confused at night and starts to talk TO deceased relatives. The evaluatin was limited to her answering questions and follow commands due to her needing glasses that were not present in room. Her memory is impaired, but felt to be at baseline according to her son. She was able follow simple commands and make her needs known and has no signs of aphasia.  With a diagnosis of demetia, it is felt that patient is at her baseline with cognition and language skills. No therapy is recommended at this time. The plan for discharge is back to the previous skilled nursing facility.     SLP Assessment  SLP Recommendation/Assessment: Patient does not need any further Speech Lanaguage Pathology Services                     SLP Evaluation Cognition  Overall Cognitive Status: History of cognitive impairments - at baseline Arousal/Alertness: Awake/alert Orientation Level: Oriented to person;Oriented to place;Oriented to time;Disoriented to situation Attention: Focused Focused Attention: Appears intact Memory: Impaired Memory Impairment: Decreased recall of new information Awareness: Appears intact       Comprehension  Auditory Comprehension Overall Auditory Comprehension: Appears within functional limits for tasks assessed Yes/No Questions: Within Functional Limits Commands: Within Functional Limits Conversation: Simple Visual Recognition/Discrimination Discrimination: Not tested Reading Comprehension Reading Status: Not tested    Expression Expression Primary Mode of Expression: Verbal Verbal Expression Overall Verbal Expression: Appears within functional  limits for tasks assessed Initiation: No impairment Level of Generative/Spontaneous Verbalization: Sentence Repetition: No impairment Naming: No impairment Pragmatics: No impairment Interfering Components:  Premorbid deficit Written Expression Dominant Hand: Right Written Expression: Not tested   Oral / Motor  Oral Motor/Sensory Function Overall Oral Motor/Sensory Function: Within functional limits Motor Speech Overall Motor Speech: Appears within functional limits for tasks assessed Respiration: Within functional limits Phonation: Normal Resonance: Within functional limits Articulation: Within functional limitis Intelligibility: Intelligible Motor Planning: Witnin functional limits Motor Speech Errors: Not applicable   GO                   Kristin Hose Nyquan Selbe, MA, CCC-SLP 11/17/2017 11:45 AM

## 2017-11-18 ENCOUNTER — Telehealth: Payer: Self-pay | Admitting: *Deleted

## 2017-11-18 DIAGNOSIS — F0390 Unspecified dementia without behavioral disturbance: Secondary | ICD-10-CM | POA: Diagnosis not present

## 2017-11-18 DIAGNOSIS — Z794 Long term (current) use of insulin: Secondary | ICD-10-CM | POA: Diagnosis not present

## 2017-11-18 DIAGNOSIS — S42294D Other nondisplaced fracture of upper end of right humerus, subsequent encounter for fracture with routine healing: Secondary | ICD-10-CM | POA: Diagnosis not present

## 2017-11-18 DIAGNOSIS — E119 Type 2 diabetes mellitus without complications: Secondary | ICD-10-CM | POA: Diagnosis not present

## 2017-11-18 DIAGNOSIS — I10 Essential (primary) hypertension: Secondary | ICD-10-CM | POA: Diagnosis not present

## 2017-11-18 DIAGNOSIS — R2689 Other abnormalities of gait and mobility: Secondary | ICD-10-CM | POA: Diagnosis not present

## 2017-11-18 MED ORDER — INSULIN GLARGINE 100 UNIT/ML SOLOSTAR PEN: PEN_INJECTOR | SUBCUTANEOUS | 0 refills | Status: DC

## 2017-11-18 NOTE — Telephone Encounter (Signed)
[facility].   Requested clarification on Lantus. States that Lantus order from hospital had (2) sigs.   MD reviewed and prescription clarified.

## 2017-11-19 DIAGNOSIS — Z794 Long term (current) use of insulin: Secondary | ICD-10-CM | POA: Diagnosis not present

## 2017-11-19 DIAGNOSIS — S42294D Other nondisplaced fracture of upper end of right humerus, subsequent encounter for fracture with routine healing: Secondary | ICD-10-CM | POA: Diagnosis not present

## 2017-11-19 DIAGNOSIS — E119 Type 2 diabetes mellitus without complications: Secondary | ICD-10-CM | POA: Diagnosis not present

## 2017-11-19 DIAGNOSIS — R2689 Other abnormalities of gait and mobility: Secondary | ICD-10-CM | POA: Diagnosis not present

## 2017-11-19 DIAGNOSIS — F0390 Unspecified dementia without behavioral disturbance: Secondary | ICD-10-CM | POA: Diagnosis not present

## 2017-11-19 DIAGNOSIS — I10 Essential (primary) hypertension: Secondary | ICD-10-CM | POA: Diagnosis not present

## 2017-11-19 NOTE — Consult Note (Signed)
            [facility] Primary Care Navigator  11/19/2017  Kristin Grant 1941/02/14 [id number]   Attempt toseepatient at the bedside to identify possible discharge needsbutshe was already dischargeto assisted living facility per staffreport. Per Inpatient social worker note, patient resides at [facility] and planned to return back there at discharge with home health services.  Per MD note, patient was admitted for stroke like episodes. MRI Brain showed no acute infarct or hemorrhage, but brain atrophy including midbrain atrophy.  Primary care provider's office is listed as providing transition of care (TOC) follow-up.  Patient has discharge instruction to follow-up with primary care provider (Dr. Milinda AntisKawanta Hinsdale) in 2 weeks and neurology follow-up in 4 weeks.   For additional questions please contact:  Karin GoldenLorraine A. Alaysha Jefcoat, BSN, RN-BC [facility] PRIMARY CARE Navigator Cell: [phone]

## 2017-11-21 ENCOUNTER — Telehealth: Payer: Self-pay | Admitting: *Deleted

## 2017-11-21 MED ORDER — TRAMADOL HCL 50 MG PO TABS: 50.0000 mg | ORAL_TABLET | Freq: Two times a day (BID) | ORAL | Status: DC

## 2017-11-21 NOTE — Telephone Encounter (Signed)
Order faxed to facility.   [facility] to make aware.

## 2017-11-21 NOTE — Telephone Encounter (Signed)
[facility].   Reports that there is a discrepancy with Tramadol orders.   Patient was on Tramadol 50mg  PO BID scheduled. States that ER D/C states Tramadol 50mg  PO Q6hrs PRN.   MD please advise.

## 2017-11-21 NOTE — Telephone Encounter (Signed)
Give her the BID dosing as before

## 2017-11-22 DIAGNOSIS — I10 Essential (primary) hypertension: Secondary | ICD-10-CM | POA: Diagnosis not present

## 2017-11-22 DIAGNOSIS — S42294D Other nondisplaced fracture of upper end of right humerus, subsequent encounter for fracture with routine healing: Secondary | ICD-10-CM | POA: Diagnosis not present

## 2017-11-22 DIAGNOSIS — E119 Type 2 diabetes mellitus without complications: Secondary | ICD-10-CM | POA: Diagnosis not present

## 2017-11-22 DIAGNOSIS — F0390 Unspecified dementia without behavioral disturbance: Secondary | ICD-10-CM | POA: Diagnosis not present

## 2017-11-22 DIAGNOSIS — R2689 Other abnormalities of gait and mobility: Secondary | ICD-10-CM | POA: Diagnosis not present

## 2017-11-22 DIAGNOSIS — Z794 Long term (current) use of insulin: Secondary | ICD-10-CM | POA: Diagnosis not present

## 2017-11-26 DIAGNOSIS — R2689 Other abnormalities of gait and mobility: Secondary | ICD-10-CM | POA: Diagnosis not present

## 2017-11-26 DIAGNOSIS — S42294D Other nondisplaced fracture of upper end of right humerus, subsequent encounter for fracture with routine healing: Secondary | ICD-10-CM | POA: Diagnosis not present

## 2017-11-26 DIAGNOSIS — Z794 Long term (current) use of insulin: Secondary | ICD-10-CM | POA: Diagnosis not present

## 2017-11-26 DIAGNOSIS — E119 Type 2 diabetes mellitus without complications: Secondary | ICD-10-CM | POA: Diagnosis not present

## 2017-11-26 DIAGNOSIS — I10 Essential (primary) hypertension: Secondary | ICD-10-CM | POA: Diagnosis not present

## 2017-11-26 DIAGNOSIS — F0390 Unspecified dementia without behavioral disturbance: Secondary | ICD-10-CM | POA: Diagnosis not present

## 2017-11-27 DIAGNOSIS — F0390 Unspecified dementia without behavioral disturbance: Secondary | ICD-10-CM | POA: Diagnosis not present

## 2017-11-27 DIAGNOSIS — R2689 Other abnormalities of gait and mobility: Secondary | ICD-10-CM | POA: Diagnosis not present

## 2017-11-27 DIAGNOSIS — I10 Essential (primary) hypertension: Secondary | ICD-10-CM | POA: Diagnosis not present

## 2017-11-27 DIAGNOSIS — S42294D Other nondisplaced fracture of upper end of right humerus, subsequent encounter for fracture with routine healing: Secondary | ICD-10-CM | POA: Diagnosis not present

## 2017-11-27 DIAGNOSIS — E119 Type 2 diabetes mellitus without complications: Secondary | ICD-10-CM | POA: Diagnosis not present

## 2017-11-27 DIAGNOSIS — Z794 Long term (current) use of insulin: Secondary | ICD-10-CM | POA: Diagnosis not present

## 2017-11-28 DIAGNOSIS — R2689 Other abnormalities of gait and mobility: Secondary | ICD-10-CM | POA: Diagnosis not present

## 2017-11-28 DIAGNOSIS — Z794 Long term (current) use of insulin: Secondary | ICD-10-CM | POA: Diagnosis not present

## 2017-11-28 DIAGNOSIS — F0390 Unspecified dementia without behavioral disturbance: Secondary | ICD-10-CM | POA: Diagnosis not present

## 2017-11-28 DIAGNOSIS — I10 Essential (primary) hypertension: Secondary | ICD-10-CM | POA: Diagnosis not present

## 2017-11-28 DIAGNOSIS — S42294D Other nondisplaced fracture of upper end of right humerus, subsequent encounter for fracture with routine healing: Secondary | ICD-10-CM | POA: Diagnosis not present

## 2017-11-28 DIAGNOSIS — E119 Type 2 diabetes mellitus without complications: Secondary | ICD-10-CM | POA: Diagnosis not present

## 2017-12-01 ENCOUNTER — Encounter: Payer: Self-pay | Admitting: *Deleted

## 2017-12-03 ENCOUNTER — Ambulatory Visit (INDEPENDENT_AMBULATORY_CARE_PROVIDER_SITE_OTHER): Payer: Medicare Other | Admitting: Family Medicine

## 2017-12-03 ENCOUNTER — Other Ambulatory Visit: Payer: Self-pay

## 2017-12-03 ENCOUNTER — Encounter: Payer: Self-pay | Admitting: Family Medicine

## 2017-12-03 VITALS — BP 128/74 | HR 78 | Temp 98.1°F | Resp 14 | Ht 64.0 in | Wt 126.0 lb

## 2017-12-03 DIAGNOSIS — Z8673 Personal history of transient ischemic attack (TIA), and cerebral infarction without residual deficits: Secondary | ICD-10-CM | POA: Diagnosis not present

## 2017-12-03 DIAGNOSIS — D696 Thrombocytopenia, unspecified: Secondary | ICD-10-CM | POA: Diagnosis not present

## 2017-12-03 DIAGNOSIS — Z794 Long term (current) use of insulin: Secondary | ICD-10-CM

## 2017-12-03 DIAGNOSIS — F0390 Unspecified dementia without behavioral disturbance: Secondary | ICD-10-CM | POA: Diagnosis not present

## 2017-12-03 DIAGNOSIS — I639 Cerebral infarction, unspecified: Secondary | ICD-10-CM | POA: Diagnosis not present

## 2017-12-03 DIAGNOSIS — E119 Type 2 diabetes mellitus without complications: Secondary | ICD-10-CM

## 2017-12-03 DIAGNOSIS — E876 Hypokalemia: Secondary | ICD-10-CM | POA: Diagnosis not present

## 2017-12-03 DIAGNOSIS — S42294D Other nondisplaced fracture of upper end of right humerus, subsequent encounter for fracture with routine healing: Secondary | ICD-10-CM | POA: Diagnosis not present

## 2017-12-03 DIAGNOSIS — I10 Essential (primary) hypertension: Secondary | ICD-10-CM | POA: Diagnosis not present

## 2017-12-03 DIAGNOSIS — R05 Cough: Secondary | ICD-10-CM | POA: Diagnosis not present

## 2017-12-03 DIAGNOSIS — R2689 Other abnormalities of gait and mobility: Secondary | ICD-10-CM | POA: Diagnosis not present

## 2017-12-03 DIAGNOSIS — R059 Cough, unspecified: Secondary | ICD-10-CM

## 2017-12-03 LAB — CBC WITH DIFFERENTIAL/PLATELET
Basophils Absolute: 58 {cells}/uL (ref 0–200)
Basophils Relative: 0.6 %
Eosinophils Absolute: 359 {cells}/uL (ref 15–500)
Eosinophils Relative: 3.7 %
HCT: 42.2 % (ref 35.0–45.0)
Hemoglobin: 13.6 g/dL (ref 11.7–15.5)
Lymphs Abs: 1736 {cells}/uL (ref 850–3900)
MCH: 26.6 pg — ABNORMAL LOW (ref 27.0–33.0)
MCHC: 32.2 g/dL (ref 32.0–36.0)
MCV: 82.6 fL (ref 80.0–100.0)
MPV: 12.8 fL — ABNORMAL HIGH (ref 7.5–12.5)
Monocytes Relative: 8.6 %
Neutro Abs: 6712 {cells}/uL (ref 1500–7800)
Neutrophils Relative %: 69.2 %
Platelets: 155 Thousand/uL (ref 140–400)
RBC: 5.11 Million/uL — ABNORMAL HIGH (ref 3.80–5.10)
RDW: 14.1 % (ref 11.0–15.0)
Total Lymphocyte: 17.9 %
WBC mixed population: 834 {cells}/uL (ref 200–950)
WBC: 9.7 Thousand/uL (ref 3.8–10.8)

## 2017-12-03 LAB — BASIC METABOLIC PANEL
BUN: 17 mg/dL (ref 7–25)
CHLORIDE: 101 mmol/L (ref 98–110)
CO2: 37 mmol/L — ABNORMAL HIGH (ref 20–32)
CREATININE: 0.9 mg/dL (ref 0.60–0.93)
Calcium: 9.4 mg/dL (ref 8.6–10.4)
Glucose, Bld: 239 mg/dL — ABNORMAL HIGH (ref 65–99)
Potassium: 4.8 mmol/L (ref 3.5–5.3)
Sodium: 143 mmol/L (ref 135–146)

## 2017-12-03 MED ORDER — GUAIFENESIN-DM 100-10 MG/5ML PO SYRP: ORAL_SOLUTION | ORAL | 2 refills | Status: DC

## 2017-12-03 NOTE — Assessment & Plan Note (Addendum)
Sugars have been improving.  Honestly her blood sugar readings are often much higher than what her calculated A1c would predict.  I am going to continue her Lantus at the twice daily dose since he seems to do better this way.

## 2017-12-03 NOTE — Progress Notes (Signed)
   Subjective:    Patient ID: Kristin Grant, female    DOB: [birth year], 77 y.o.   MRN: [id number]  Patient presents for Hospital F/U (CVA) Viewed hospital discharge summary Pt here for hospital follow up, advitted secondary to Stroke like episode, She had lower extremelty weakness, she had difficulty speaking, had gaze to left size.  code stroke called, given TPA, based on presenttion from the ALF However MRI showed no acute infarct, CT head no acute abnormaltiy  EEG- no seizures, 2D Echo- no embolus    Diabetes - last A1C 7% on lantus 10 UNITS bid  AND Tradjenta  Hypokalemia noted on last BMET- 3.2 No blood sugar readings from the facility in her folder  Mild thrombocytopenia, Plt 118 on 3/25   She has had some cough nonproductive.  No fever.  She states that she has not had any cough medicine.  Does not feel short of breath    Review Of Systems: Per above,  Unable to give complete ROS  GEN- denies fatigue, fever, weight loss,weakness, recent illness HEENT- denies eye drainage, change in vision, nasal discharge, CVS- denies chest pain, palpitations RESP- denies SOB, +cough, wheeze ABD- denies N/V, change in stools, abd pain GU- denies dysuria, hematuria, dribbling, incontinence MSK- denies joint pain, muscle aches, injury Neuro- denies headache, dizziness, syncope, seizure activity       Objective:    BP 128/74   Pulse 78   Temp 98.1 F (36.7 C) (Oral)   Resp 14   Ht 5\' 4"  (1.626 m)   Wt 126 lb (57.2 kg)   SpO2 98%   BMI 21.63 kg/m  GEN- NAD, alert and oriented x3 HEENT- PERRL, EOMI, non injected sclera, pink conjunctiva, MMM, oropharynx clear Neck- Supple, no thyromegaly CVS- RRR, no murmur RESP-CTAB ABD-NABS,soft,NT,ND Neuro-CNII-XII in tact, no new deficits,walks with walker EXT- No edema Pulses- Radial, DP- 2+        Assessment & Plan:      Problem List Items Addressed This Visit      Unprioritized   Hypokalemia - Primary   Relevant Orders   Basic metabolic panel   Thrombocytopenia (HCC)    Recheck CBC      Relevant Orders   CBC with Differential/Platelet   History of CVA (cerebrovascular accident)    Unclear if this was a transient ischemic attack as there was nothing new on her MRI.  We will schedule follow-up with neurology here in the office.  She is continue her current medications.  Her blood pressure is controlled.      Diabetes mellitus, type II (HCC)    Sugars have been improving.  Honestly her blood sugar readings are often much higher than what her calculated A1c would predict.  I am going to continue her Lantus at the twice daily dose since he seems to do better this way.       Other Visit Diagnoses    Cough       Robitussin DM, chest clear, no sign of PNA      Note: This dictation was prepared with Dragon dictation along with smaller phrase technology. Any transcriptional errors that result from this process are unintentional.

## 2017-12-03 NOTE — Assessment & Plan Note (Signed)
Unclear if this was a transient ischemic attack as there was nothing new on her MRI.  We will schedule follow-up with neurology here in the office.  She is continue her current medications.  Her blood pressure is controlled.

## 2017-12-03 NOTE — Assessment & Plan Note (Signed)
Recheck CBC. 

## 2017-12-03 NOTE — Patient Instructions (Addendum)
Robitussin given for cough Neurology appointment will be on 5/1 at 1:15pm F/U 3 months

## 2017-12-04 DIAGNOSIS — Z794 Long term (current) use of insulin: Secondary | ICD-10-CM | POA: Diagnosis not present

## 2017-12-04 DIAGNOSIS — I10 Essential (primary) hypertension: Secondary | ICD-10-CM | POA: Diagnosis not present

## 2017-12-04 DIAGNOSIS — E119 Type 2 diabetes mellitus without complications: Secondary | ICD-10-CM | POA: Diagnosis not present

## 2017-12-04 DIAGNOSIS — S42294D Other nondisplaced fracture of upper end of right humerus, subsequent encounter for fracture with routine healing: Secondary | ICD-10-CM | POA: Diagnosis not present

## 2017-12-04 DIAGNOSIS — F0390 Unspecified dementia without behavioral disturbance: Secondary | ICD-10-CM | POA: Diagnosis not present

## 2017-12-04 DIAGNOSIS — R2689 Other abnormalities of gait and mobility: Secondary | ICD-10-CM | POA: Diagnosis not present

## 2017-12-05 ENCOUNTER — Encounter: Payer: Self-pay | Admitting: *Deleted

## 2017-12-05 ENCOUNTER — Other Ambulatory Visit: Payer: Self-pay | Admitting: Family Medicine

## 2017-12-05 DIAGNOSIS — Z794 Long term (current) use of insulin: Secondary | ICD-10-CM | POA: Diagnosis not present

## 2017-12-05 DIAGNOSIS — E119 Type 2 diabetes mellitus without complications: Secondary | ICD-10-CM | POA: Diagnosis not present

## 2017-12-05 DIAGNOSIS — I10 Essential (primary) hypertension: Secondary | ICD-10-CM | POA: Diagnosis not present

## 2017-12-05 DIAGNOSIS — S42294D Other nondisplaced fracture of upper end of right humerus, subsequent encounter for fracture with routine healing: Secondary | ICD-10-CM | POA: Diagnosis not present

## 2017-12-05 DIAGNOSIS — F0390 Unspecified dementia without behavioral disturbance: Secondary | ICD-10-CM | POA: Diagnosis not present

## 2017-12-05 DIAGNOSIS — R2689 Other abnormalities of gait and mobility: Secondary | ICD-10-CM | POA: Diagnosis not present

## 2017-12-08 ENCOUNTER — Other Ambulatory Visit: Payer: Self-pay | Admitting: Neurology

## 2017-12-08 ENCOUNTER — Other Ambulatory Visit: Payer: Self-pay | Admitting: Family Medicine

## 2017-12-08 DIAGNOSIS — E119 Type 2 diabetes mellitus without complications: Secondary | ICD-10-CM | POA: Diagnosis not present

## 2017-12-08 DIAGNOSIS — F0390 Unspecified dementia without behavioral disturbance: Secondary | ICD-10-CM | POA: Diagnosis not present

## 2017-12-08 DIAGNOSIS — Z794 Long term (current) use of insulin: Secondary | ICD-10-CM | POA: Diagnosis not present

## 2017-12-08 DIAGNOSIS — S42294D Other nondisplaced fracture of upper end of right humerus, subsequent encounter for fracture with routine healing: Secondary | ICD-10-CM | POA: Diagnosis not present

## 2017-12-08 DIAGNOSIS — I10 Essential (primary) hypertension: Secondary | ICD-10-CM | POA: Diagnosis not present

## 2017-12-08 DIAGNOSIS — R2689 Other abnormalities of gait and mobility: Secondary | ICD-10-CM | POA: Diagnosis not present

## 2017-12-10 DIAGNOSIS — E119 Type 2 diabetes mellitus without complications: Secondary | ICD-10-CM | POA: Diagnosis not present

## 2017-12-10 DIAGNOSIS — S42294D Other nondisplaced fracture of upper end of right humerus, subsequent encounter for fracture with routine healing: Secondary | ICD-10-CM | POA: Diagnosis not present

## 2017-12-10 DIAGNOSIS — I10 Essential (primary) hypertension: Secondary | ICD-10-CM | POA: Diagnosis not present

## 2017-12-10 DIAGNOSIS — F0390 Unspecified dementia without behavioral disturbance: Secondary | ICD-10-CM | POA: Diagnosis not present

## 2017-12-10 DIAGNOSIS — R2689 Other abnormalities of gait and mobility: Secondary | ICD-10-CM | POA: Diagnosis not present

## 2017-12-10 DIAGNOSIS — Z794 Long term (current) use of insulin: Secondary | ICD-10-CM | POA: Diagnosis not present

## 2017-12-12 DIAGNOSIS — F0390 Unspecified dementia without behavioral disturbance: Secondary | ICD-10-CM | POA: Diagnosis not present

## 2017-12-12 DIAGNOSIS — E119 Type 2 diabetes mellitus without complications: Secondary | ICD-10-CM | POA: Diagnosis not present

## 2017-12-12 DIAGNOSIS — I10 Essential (primary) hypertension: Secondary | ICD-10-CM | POA: Diagnosis not present

## 2017-12-12 DIAGNOSIS — R2689 Other abnormalities of gait and mobility: Secondary | ICD-10-CM | POA: Diagnosis not present

## 2017-12-12 DIAGNOSIS — S42294D Other nondisplaced fracture of upper end of right humerus, subsequent encounter for fracture with routine healing: Secondary | ICD-10-CM | POA: Diagnosis not present

## 2017-12-12 DIAGNOSIS — Z794 Long term (current) use of insulin: Secondary | ICD-10-CM | POA: Diagnosis not present

## 2017-12-15 ENCOUNTER — Telehealth: Payer: Self-pay | Admitting: *Deleted

## 2017-12-15 DIAGNOSIS — E119 Type 2 diabetes mellitus without complications: Secondary | ICD-10-CM | POA: Diagnosis not present

## 2017-12-15 DIAGNOSIS — F0390 Unspecified dementia without behavioral disturbance: Secondary | ICD-10-CM | POA: Diagnosis not present

## 2017-12-15 DIAGNOSIS — I10 Essential (primary) hypertension: Secondary | ICD-10-CM | POA: Diagnosis not present

## 2017-12-15 DIAGNOSIS — R2689 Other abnormalities of gait and mobility: Secondary | ICD-10-CM | POA: Diagnosis not present

## 2017-12-15 DIAGNOSIS — Z794 Long term (current) use of insulin: Secondary | ICD-10-CM | POA: Diagnosis not present

## 2017-12-15 DIAGNOSIS — S42294D Other nondisplaced fracture of upper end of right humerus, subsequent encounter for fracture with routine healing: Secondary | ICD-10-CM | POA: Diagnosis not present

## 2017-12-15 NOTE — Telephone Encounter (Signed)
Received VM from [place] with [facility]. [phone]~ telephone.   Reports that patient FSBS noted at 436 on 12/14/2017 @ 4pm. Patient was given water and upon re-check noted to be 388.  Call placed to facility to inquire. Was advised that patient FSBS noted at 169 this AM. Advised to continue current medications and continue to monitor.

## 2017-12-16 DIAGNOSIS — M79674 Pain in right toe(s): Secondary | ICD-10-CM | POA: Diagnosis not present

## 2017-12-16 DIAGNOSIS — B351 Tinea unguium: Secondary | ICD-10-CM | POA: Diagnosis not present

## 2017-12-16 DIAGNOSIS — M79675 Pain in left toe(s): Secondary | ICD-10-CM | POA: Diagnosis not present

## 2017-12-17 DIAGNOSIS — I10 Essential (primary) hypertension: Secondary | ICD-10-CM | POA: Diagnosis not present

## 2017-12-17 DIAGNOSIS — F0390 Unspecified dementia without behavioral disturbance: Secondary | ICD-10-CM | POA: Diagnosis not present

## 2017-12-17 DIAGNOSIS — E119 Type 2 diabetes mellitus without complications: Secondary | ICD-10-CM | POA: Diagnosis not present

## 2017-12-17 DIAGNOSIS — R2689 Other abnormalities of gait and mobility: Secondary | ICD-10-CM | POA: Diagnosis not present

## 2017-12-17 DIAGNOSIS — S42294D Other nondisplaced fracture of upper end of right humerus, subsequent encounter for fracture with routine healing: Secondary | ICD-10-CM | POA: Diagnosis not present

## 2017-12-17 DIAGNOSIS — Z794 Long term (current) use of insulin: Secondary | ICD-10-CM | POA: Diagnosis not present

## 2017-12-19 ENCOUNTER — Other Ambulatory Visit: Payer: Self-pay | Admitting: *Deleted

## 2017-12-19 MED ORDER — INSULIN ASPART 100 UNIT/ML FLEXPEN: 2.0000 [IU] | PEN_INJECTOR | Freq: Every evening | SUBCUTANEOUS | 11 refills | Status: DC

## 2017-12-19 MED ORDER — INSULIN LISPRO 100 UNIT/ML (KWIKPEN): 2.0000 [IU] | PEN_INJECTOR | Freq: Every evening | SUBCUTANEOUS | 11 refills | Status: DC

## 2017-12-24 ENCOUNTER — Ambulatory Visit (INDEPENDENT_AMBULATORY_CARE_PROVIDER_SITE_OTHER): Payer: Medicare Other | Admitting: Orthopedic Surgery

## 2017-12-24 ENCOUNTER — Ambulatory Visit (INDEPENDENT_AMBULATORY_CARE_PROVIDER_SITE_OTHER): Payer: Medicare Other | Admitting: Adult Health

## 2017-12-24 ENCOUNTER — Encounter: Payer: Self-pay | Admitting: Adult Health

## 2017-12-24 ENCOUNTER — Encounter: Payer: Self-pay | Admitting: Orthopedic Surgery

## 2017-12-24 VITALS — BP 124/77 | HR 91 | Ht 64.0 in | Wt 130.0 lb

## 2017-12-24 VITALS — BP 127/62 | HR 86 | Ht 64.0 in | Wt 132.0 lb

## 2017-12-24 DIAGNOSIS — S42294D Other nondisplaced fracture of upper end of right humerus, subsequent encounter for fracture with routine healing: Secondary | ICD-10-CM | POA: Diagnosis not present

## 2017-12-24 DIAGNOSIS — I639 Cerebral infarction, unspecified: Secondary | ICD-10-CM

## 2017-12-24 DIAGNOSIS — I25119 Atherosclerotic heart disease of native coronary artery with unspecified angina pectoris: Secondary | ICD-10-CM | POA: Diagnosis not present

## 2017-12-24 DIAGNOSIS — R2990 Unspecified symptoms and signs involving the nervous system: Secondary | ICD-10-CM

## 2017-12-24 NOTE — Progress Notes (Signed)
[facility] [address] [place] [address] [phone]       OFFICE FOLLOW UP NOTE  Ms. Conleigh Heinlein Date of Birth:  1940/10/12 Medical Record Number:  [id number]   Reason for Referral:  hospital suspected stroke s/p TPA follow up  CHIEF COMPLAINT:  Chief Complaint  Patient presents with  . Follow-up    Hospital follow up, saw D. Xu in hospital in assisted living facilityn pt at [facility] living no family present CNA is with patient her name is Susy Frizzle who works at the facility    HPI: Gabryelle Whitmoyer is being seen today for initial visit in the office for suspected stroke s/p TPA on 11/14/17. History obtained from patient and chart review. Reviewed all radiology images and labs personally.  Ms. Shakiera Edelson is a 77 y.o. female with history of Alzheimer's dementia, hypothyroidism, coronary artery disease, diabetes mellitus, hypertension, right shoulder fracture in January and previous stroke presenting with increased confusion and aphasia at [facility]. CT head showed no acute abnormality. Through tele-neurology, it was determined patient should receive IV tPA  and then transferred to [facility]. MRI brain reviewed and showed no acute infarct but did show brain atrophy including midbrain atrophy. CTA and neck showed bilateral V4 stenosis. EEG showed mild background slowing but negative for seizure. 2D echo showed EF of 55-60%. LDL satisfactory at 47. A1c 7.0 and recommended close follow up with PCP. Previously on aspirin and plavix and recommended to continue aspirin and plavix. Patient returned back to assisted living at [facility].   Since discharge, patient's been doing well.  Continues to reside at [facility]. Continues aspirin and Plavix without side effects of myalgias.  Blood pressures today's visit 122/62.  She states that her speech is good and is at her baseline.  Patient does have a history of Alzheimer's dementia and CNA who is present with patient  does not believe her memory has worsened.  Patient is disoriented to time and place.  Recall 0/3.  AFT 7 and clock drawing 0/4.  MMSE performed on patient today in order to get a baseline and monitor memory status.  Denies new or worsening stroke/TIA symptoms.  ROS:   14 system review of systems performed and negative with exception of memory loss  PMH:  Past Medical History:  Diagnosis Date  . Alzheimer disease   . Arthritis   . Coronary artery disease   . Diabetes mellitus   . Difficulty walking   . Hypertension   . Reflux     PSH:  Past Surgical History:  Procedure Laterality Date  . APPENDECTOMY    . FINGER DEBRIDEMENT      Social History:  Social History   Socioeconomic History  . Marital status: Widowed    Spouse name: Not on file  . Number of children: Not on file  . Years of education: Not on file  . Highest education level: Not on file  Occupational History  . Not on file  Social Needs  . Financial resource strain: Not on file  . Food insecurity:    Worry: Not on file    Inability: Not on file  . Transportation needs:    Medical: Not on file    Non-medical: Not on file  Tobacco Use  . Smoking status: Never Smoker  . Smokeless tobacco: Never Used  Substance and Sexual Activity  . Alcohol use: No  . Drug use: No  . Sexual activity: Not on file  Lifestyle  . Physical activity:    Days per week: Not on file    Minutes per session: Not on file  . Stress: Not on file  Relationships  . Social connections:    Talks on phone: Not on file    Gets together: Not on file    Attends religious service: Not on file    Active member of club or organization: Not on file    Attends meetings of clubs or organizations: Not on file    Relationship status: Not on file  . Intimate partner violence:    Fear of current or ex partner: Not on file    Emotionally abused: Not on file    Physically abused: Not on file    Forced sexual activity: Not on file  Other Topics  Concern  . Not on file  Social History Narrative  . Not on file    Family History:  Family History  Problem Relation Age of Onset  . Hypertension Mother   . Heart disease Mother   . Hypertension Father   . Heart disease Father   . Hypertension Sister   . Heart disease Sister   . Cancer Sister        BREAST   . Hypertension Brother   . Heart disease Brother     Medications:   Current Outpatient Medications on File Prior to Visit  Medication Sig Dispense Refill  . aspirin 81 MG tablet TAKE 1 TABLET BY MOUTH ONCE DAILY. 30 tablet 0  . Blood Glucose Monitoring Suppl (BLOOD GLUCOSE MONITOR SYSTEM) W/DEVICE KIT 1 Units by Does not apply route daily. 1 each 0  . clopidogrel (PLAVIX) 75 MG tablet TAKE 1 TABLET (42m) BY MOUTH ONCE DAILY. 30 tablet 11  . guaiFENesin-dextromethorphan (ROBITUSSIN DM) 100-10 MG/5ML syrup Give 541mpo TID for 1 week, then as needed 118 mL 2  . Insulin Glargine (LANTUS SOLOSTAR) 100 UNIT/ML Solostar Pen INJECT 10 UNITS SUBCUTANEOUSLY TWICE DAILY. 15 mL 0  . insulin lispro (HUMALOG KWIKPEN) 100 UNIT/ML KiwkPen Inject 0.02 mLs (2 Units total) into the skin every evening. With evening meal. 15 mL 11  . Insulin Pen Needle (NOVOFINE AUTOCOVER) 30G X 8 MM MISC USE WITH INSULIN FOR ADMINISTRATION. 100 each 11  . levothyroxine (SYNTHROID, LEVOTHROID) 50 MCG tablet TAKE 1 TABLET BY MOUTH ONCE DAILY. 30 tablet 11  . lisinopril (PRINIVIL,ZESTRIL) 40 MG tablet TAKE 1 TABLET BY MOUTH ONCE DAILY. (Patient taking differently: TAKE 1 TABLET (404mBY MOUTH ONCE DAILY.) 30 tablet 3  . mirtazapine (REMERON) 15 MG tablet TAKE 1 TABLET BY MOUTH AT BEDTIME. 30 tablet 11  . Multiple Vitamin (DAILY VITE) TABS TAKE 1 TABLET BY MOUTH ONCE DAILY. 30 each 0  . Nutritional Supplements (NUTRITIONAL SUPPLEMENT PO) Take 1 Container by mouth 3 (three) times daily.     . pantoprazole (PROTONIX) 40 MG tablet TAKE 1 TABLET BY MOUTH ONCE DAILY. 30 tablet 11  . propranolol (INDERAL) 40 MG tablet  TAKE 1 TABLET BY MOUTH TWICE DAILY. (Patient taking differently: TAKE 1 TABLET (61m31mY MOUTH TWICE DAILY.) 60 tablet 3  . TRADJENTA 5 MG TABS tablet TAKE 1 TABLET BY MOUTH ONCE DAILY. 30 tablet 11  . traMADol (ULTRAM) 50 MG tablet Take 1 tablet (50 mg total) by mouth every 12 (twelve) hours.     No current facility-administered medications on file prior to visit.     Allergies:  No Known Allergies   Physical Exam  Vitals:   12/24/17 1332  BP: 127/62  Pulse: 86  Weight: 132 lb (59.9 kg)  Height: '5\' 4"'  (1.626 m)   Body mass index is 22.66 kg/m. No exam data present  General: Frail elderly pleasant Caucasian female, seated, in no evident distress Head: head normocephalic and atraumatic.   Neck: supple with no carotid or supraclavicular bruits Cardiovascular: regular rate and rhythm, no murmurs Musculoskeletal: no deformity Skin:  no rash/petichiae Vascular:  Normal pulses all extremities  Neurologic Exam Mental Status: Awake and fully alert.  Mild dysarthria at baseline.  Disoriented to place and time. Attention span, concentration and fund of knowledge appropriate. Mood and affect appropriate. AFT 7. Clock drawing 0/4. Recall 0/3. MMSE 13. MMSE - Mini Mental State Exam 12/24/2017  Orientation to time 2  Orientation to Place 1  Registration 3  Attention/ Calculation 0  Recall 1  Language- name 2 objects 2  Language- repeat 0  Language- follow 3 step command 3  Language- read & follow direction 1  Write a sentence 0  Copy design 0  Total score 13   Cranial Nerves: Fundoscopic exam reveals sharp disc margins. Pupils equal, briskly reactive to light. Extraocular movements full without nystagmus. Visual fields full to confrontation. Hearing intact. Facial sensation intact. Face, tongue, palate moves normally and symmetrically. Disconjugate gaze with left eye lazy eye, chronic, no vertical eye movement difficulty Motor: Normal bulk and tone. Normal strength in all tested  extremity muscles. Sensory.: intact to touch , pinprick , position and vibratory sensation.  Coordination: Rapid alternating movements normal in all extremities. Finger-to-nose and heel-to-shin performed accurately bilaterally. Gait and Station: Arises from chair without difficulty. Stance is hunched. Gait demonstrates normal stride length and balance .  Unable to perform tandem walk. Reflexes: 1+ and symmetric. Toes downgoing.    NIHSS  1 Modified Rankin  3   Diagnostic Data (Labs, Imaging, Testing)  CT HEAD WO CONTRAST 11/14/17 IMPRESSION: 1. 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 2. ASPECTS is 10.  CT CEREBRAL PERFUSION W CONTRAST 11/14/17 IMPRESSION: No acute infarct or ischemic penumbra demonstrated by CT perfusion criteria.  CT ANGIO HEAD W OR WO CONTRAST CT ANGIO NECK W OR WO CONTRAST 11/15/17 IMPRESSION: 1. Negative CTA for large vessel occlusion. 2. Atherosclerotic plaque involving the bilateral V4 segments with resultant moderate stenosis on the left and severe stenosis on the right. Left vertebral artery is dominant. Otherwise widely patent vertebrobasilar system. 3. Additional scattered mild atherosclerotic change for age. No other high-grade or correctable stenosis identified.  MR BRAIN WO CONTRAST 11/16/17 IMPRESSION: 1. No acute infarct or hemorrhage. 2. Severe chronic small vessel ischemia. 3. 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  EEG 11/15/17 Impression: This is an abnormal EEG due to mild background slowing seen throughout the tracing.  This is a very non-specific finding that can be seen with toxic, metabolic, diffuse, or multifocal structural processes, and would not be inconsistent with the patients known history of dementia.  No definite epileptiform changes were noted.   A single EEG without epileptiform changes does not  exclude the diagnosis of epilepsy. Clinical correlation advised.  2D ECHOCARDIOGRAM 11/17/17 Study Conclusions - Left ventricle: The cavity size was normal. Wall thickness was   normal. Systolic function was normal. The estimated ejection   fraction was in the range of 55% to 60%. Wall motion was normal;   there were no regional wall motion abnormalities. Doppler   parameters are consistent with abnormal left  ventricular   relaxation (grade 1 diastolic dysfunction). - Ventricular septum: Septal motion showed paradox. These changes   are consistent with a left bundle branch block. - Mitral valve: Moderately calcified annulus. - Pericardium, extracardiac: A trivial pericardial effusion was   identified posterior to the heart.    ASSESSMENT: Kristin Grant is a 77 y.o. year old female here with suspected stroke s/p TPA on 11/14/17. Vascular risk factors include CAD, DM, and HTN.     PLAN: -Continue aspirin 81 mg daily and clopidogrel 75 mg daily for secondary stroke prevention -F/u with PCP regarding your HTN, DM and CAD management -continue to monitor BP at home -repeat MMSE at next visit to monitor memory - consider Aricept or Namenda if worsened at next visit -Maintain strict control of hypertension with blood pressure goal below 130/90, diabetes with hemoglobin A1c goal below 6.5% and cholesterol with LDL cholesterol (bad cholesterol) goal below 70 mg/dL. I also advised the patient to eat a healthy diet with plenty of whole grains, cereals, fruits and vegetables, exercise regularly and maintain ideal body weight.  Follow up in 3 months or call earlier if needed   Greater than 50% time during this 25 minute consultation visit was spent on counseling and coordination of care about HTN, DM and CAD, discussion about risk benefit of anticoagulation and answering questions.     Venancio Poisson, AGNP-BC  [facility] [address] [place], [place]  [address]  Phone [phone] Fax [phone]

## 2017-12-24 NOTE — Progress Notes (Signed)
Fracture care follow-up  Chief Complaint  Patient presents with  . Shoulder Injury    right shoulder fracture 09/18/17 patient feels better     Encounter Diagnosis  Name Primary?  . Other closed nondisplaced fracture of proximal end of right humerus with routine healing, subsequent encounter 09/18/17 Yes    Proximal humerus fracture in a 77 year old female treated with sling followed by physical therapy.  She does not complain of pain she says she can move her arm better  BP 124/77   Pulse 91   Ht  (1.626 m)   Wt 130 lb (59 kg)   BMI 22.31 kg/m   Physical Exam  She has passive abduction of 90 degrees flexion 90 degrees active the same Xrays: No imaging studies  Plan   Recommend physical therapy ordered for another 4 weeks, no follow-up needed

## 2017-12-24 NOTE — Patient Instructions (Signed)
Continue aspirin 81 mg daily and clopidogrel 75 mg daily  for secondary stroke prevention  Follow up with PCP regarding blood pressure, diabetes and coronary artery disease management  Continue to monitor BP  Continue to stay active and eat healthy  Maintain strict control of hypertension with blood pressure goal below 130/90, diabetes with hemoglobin A1c goal below 6.5% and cholesterol with LDL cholesterol (bad cholesterol) goal below 70 mg/dL. I also advised the patient to eat a healthy diet with plenty of whole grains, cereals, fruits and vegetables, exercise regularly and maintain ideal body weight.  Followup in the future with me in 3 months or call earlier if needed

## 2017-12-25 ENCOUNTER — Other Ambulatory Visit: Payer: Self-pay | Admitting: Family Medicine

## 2017-12-26 NOTE — Progress Notes (Signed)
I reviewed above note and agree with the assessment and plan.  Marvel Plan, MD PhD Stroke Neurology 12/26/2017 6:00 PM

## 2017-12-31 ENCOUNTER — Telehealth: Payer: Self-pay | Admitting: *Deleted

## 2017-12-31 NOTE — Telephone Encounter (Signed)
Received call from [facility].   Reports that patient CBG prior to lunch noted at 437. States that patient drank water and is walking around. After lunch, CBG noted at 411.  MD please advise.

## 2017-12-31 NOTE — Telephone Encounter (Signed)
Continue lantus BID Add Novolog 2 units with lunch Already getting 2 units with dinner

## 2017-12-31 NOTE — Telephone Encounter (Signed)
Received a phone call from [facility] again stating that they have since rechecked patient glucose and it is now 521. Patient is not due for humalog until dinner time? Please advise?

## 2017-12-31 NOTE — Telephone Encounter (Signed)
cHANGE TO 3 units with lunch and dinner Give verbal order to give 1 more unit now with dinner Give her regular lantus tonight and in the AM

## 2017-12-31 NOTE — Telephone Encounter (Addendum)
Order faxed to [facility] for Novolog 2U with noon meal.   Call placed to facility. Reports that CBG re-checked after giving evening meal time Novolog 2U and reading noted at 456.  MD made aware and NO given to increase Novolog to 3U BID with noon and evening meals. Continue Lantus 10 U BID.   Order faxed.

## 2018-01-01 ENCOUNTER — Telehealth: Payer: Self-pay | Admitting: *Deleted

## 2018-01-01 MED ORDER — INSULIN LISPRO 100 UNIT/ML (KWIKPEN): 3.0000 [IU] | PEN_INJECTOR | Freq: Two times a day (BID) | SUBCUTANEOUS | 11 refills | Status: DC

## 2018-01-01 NOTE — Telephone Encounter (Signed)
Received call from Lupita Leash, nurse with ALF.   Reports that patient insurance will not cover Novolog. Requested order to change insulin to Humalog.   States that patient was given Humalog last night as medications are interchangable, but she requires order.   Order faxed to facility.   MD to be made aware.

## 2018-01-02 ENCOUNTER — Telehealth: Payer: Self-pay | Admitting: *Deleted

## 2018-01-02 ENCOUNTER — Encounter: Payer: Self-pay | Admitting: *Deleted

## 2018-01-02 NOTE — Telephone Encounter (Signed)
Call back have them check CBG now, also schedule OV for next week Bring her meter as well

## 2018-01-02 NOTE — Telephone Encounter (Signed)
Received call from Lupita Leash, nurse with [facility]. [phone] telephone.  Reports that patient CBG before lunch noted at 507. Patient given Humalog 3U as ordered. States that patient has her own meter and strips and that they are fairly new. Reports that each patient has to have their own supplies and meter by law. Meter does not have control solution to check accuracy.   Also states that patient has snacks in her room. Unable to determine if patient is monitoring her sugar/ carb intake.   MD please advise.

## 2018-01-02 NOTE — Telephone Encounter (Signed)
noted 

## 2018-01-02 NOTE — Telephone Encounter (Signed)
Call placed to [facility] at [facility].   Advised that current CBG is 327 after lunch.   Appointment scheduled and patient will bring in meter.

## 2018-01-06 ENCOUNTER — Encounter: Payer: Self-pay | Admitting: Family Medicine

## 2018-01-06 ENCOUNTER — Ambulatory Visit (INDEPENDENT_AMBULATORY_CARE_PROVIDER_SITE_OTHER): Payer: Medicare Other | Admitting: Family Medicine

## 2018-01-06 ENCOUNTER — Other Ambulatory Visit: Payer: Self-pay

## 2018-01-06 VITALS — BP 128/72 | HR 82 | Temp 98.2°F | Resp 14 | Ht 64.0 in | Wt 130.0 lb

## 2018-01-06 DIAGNOSIS — F0150 Vascular dementia without behavioral disturbance: Secondary | ICD-10-CM | POA: Diagnosis not present

## 2018-01-06 DIAGNOSIS — E119 Type 2 diabetes mellitus without complications: Secondary | ICD-10-CM

## 2018-01-06 DIAGNOSIS — I639 Cerebral infarction, unspecified: Secondary | ICD-10-CM | POA: Diagnosis not present

## 2018-01-06 DIAGNOSIS — Z794 Long term (current) use of insulin: Secondary | ICD-10-CM

## 2018-01-06 LAB — HEMOGLOBIN A1C, FINGERSTICK: Hgb A1C (fingerstick): 9.1 % OF TOTAL HGB — ABNORMAL HIGH (ref <6.0)

## 2018-01-06 LAB — GLUCOSE 16585: Glucose: 340 mg/dL — ABNORMAL HIGH (ref 65–99)

## 2018-01-06 MED ORDER — INSULIN GLARGINE 100 UNIT/ML SOLOSTAR PEN: PEN_INJECTOR | SUBCUTANEOUS | 3 refills | Status: DC

## 2018-01-06 NOTE — Patient Instructions (Signed)
Diabetic diet lantus increased to 12 units BID  Continue humalog 3 units with lunch and dinner F/U as previous

## 2018-01-06 NOTE — Progress Notes (Signed)
   Subjective:    Patient ID: Kristin Grant, female    DOB: [birth year], 77 y.o.   MRN: [id number]  Patient presents for Follow-up (cbg with patient meter 282)  Pt here to f/u DM, We have received multiple messages from facility of her Blood sugar being > 400 before and after meals Currently on Lantus 10 units BID Also started on meal time coverage, initially just dinner at 2units of Humalog but now taking 3 units of humalog with lunch and dinner Per facility has snacks in her room ? Her weight is stable, no significant loss or gain Her A1C March 23 was 7% Recommended they bring her meter today so we can check  Her meter read 282  Our meter read 340 / recheck on her Meter 315   She is also on Tradjenta   Fasting blood sugars are in the 200s to 300 pre-meal 3-400 on average   Review Of Systems:  GEN- denies fatigue, fever, weight loss,weakness, recent illness HEENT- denies eye drainage, change in vision, nasal discharge, CVS- denies chest pain, palpitations RESP- denies SOB, cough, wheeze ABD- denies N/V, change in stools, abd pain GU- denies dysuria, hematuria, dribbling, incontinence MSK- denies joint pain, muscle aches, injury Neuro- denies headache, dizziness, syncope, seizure activity       Objective:    BP 128/72   Pulse 82   Temp 98.2 F (36.8 C) (Oral)   Resp 14   Ht  (1.626 m)   Wt 130 lb (59 kg)   SpO2 98%   BMI 22.31 kg/m  GEN- NAD, alert and oriented x 2  HEENT- PERRL, EOMI, non injected sclera, pink conjunctiva, MMM, oropharynx clear CVS- RRR, no murmur RESP-CTAB ABD-NABS,soft,NT,ND EXT- No edema Pulses- Radial, 2+        Assessment & Plan:      Problem List Items Addressed This Visit      Unprioritized   Dementia   Diabetes mellitus, type II (HCC) - Primary    A1c has gone up significantly in the past 6 weeks or so now at 9%.  Unclear if she is eating significant sweets in her room she does admit to eating a lot of desserts with her  meals.  When sure that she is on a diabetic diet.  We will increase her Lantus 12 units twice a day.  Continue her short acting insulin at 3 units twice a day follow-up her log of her blood sugars in 1 week      Relevant Medications   Insulin Glargine (LANTUS SOLOSTAR) 100 UNIT/ML Solostar Pen   Other Relevant Orders   Hemoglobin A1C, fingerstick (Completed)   Glucose, fingerstick (stat)      Note: This dictation was prepared with Dragon dictation along with smaller phrase technology. Any transcriptional errors that result from this process are unintentional.

## 2018-01-06 NOTE — Assessment & Plan Note (Signed)
A1c has gone up significantly in the past 6 weeks or so now at 9%.  Unclear if she is eating significant sweets in her room she does admit to eating a lot of desserts with her meals.  When sure that she is on a diabetic diet.  We will increase her Lantus 12 units twice a day.  Continue her short acting insulin at 3 units twice a day follow-up her log of her blood sugars in 1 week

## 2018-01-15 DIAGNOSIS — Z7984 Long term (current) use of oral hypoglycemic drugs: Secondary | ICD-10-CM | POA: Diagnosis not present

## 2018-01-15 DIAGNOSIS — H25813 Combined forms of age-related cataract, bilateral: Secondary | ICD-10-CM | POA: Diagnosis not present

## 2018-01-15 DIAGNOSIS — E119 Type 2 diabetes mellitus without complications: Secondary | ICD-10-CM | POA: Diagnosis not present

## 2018-01-15 DIAGNOSIS — H524 Presbyopia: Secondary | ICD-10-CM | POA: Diagnosis not present

## 2018-02-12 DIAGNOSIS — H52222 Regular astigmatism, left eye: Secondary | ICD-10-CM | POA: Diagnosis not present

## 2018-02-12 DIAGNOSIS — H524 Presbyopia: Secondary | ICD-10-CM | POA: Diagnosis not present

## 2018-02-12 DIAGNOSIS — H5203 Hypermetropia, bilateral: Secondary | ICD-10-CM | POA: Diagnosis not present

## 2018-02-17 ENCOUNTER — Other Ambulatory Visit: Payer: Self-pay

## 2018-02-17 DIAGNOSIS — B351 Tinea unguium: Secondary | ICD-10-CM | POA: Diagnosis not present

## 2018-02-17 DIAGNOSIS — M79674 Pain in right toe(s): Secondary | ICD-10-CM | POA: Diagnosis not present

## 2018-02-17 DIAGNOSIS — M79675 Pain in left toe(s): Secondary | ICD-10-CM | POA: Diagnosis not present

## 2018-02-17 NOTE — Patient Outreach (Signed)
Telephone outreach to patient to obtain mRS was successfully completed. mRS = 4 

## 2018-02-18 ENCOUNTER — Other Ambulatory Visit: Payer: Self-pay | Admitting: *Deleted

## 2018-02-18 NOTE — Telephone Encounter (Signed)
Received call from pharmacy.   States that facility is asking to refill Lantus too soon. Inquired as to dose patient is supposed to be on.   Advised that patient should be taking 12u BID.

## 2018-02-19 ENCOUNTER — Other Ambulatory Visit: Payer: Self-pay | Admitting: *Deleted

## 2018-02-19 MED ORDER — INSULIN GLARGINE 100 UNIT/ML SOLOSTAR PEN: PEN_INJECTOR | SUBCUTANEOUS | 3 refills | Status: DC

## 2018-03-04 ENCOUNTER — Encounter: Payer: Self-pay | Admitting: Family Medicine

## 2018-03-04 ENCOUNTER — Other Ambulatory Visit: Payer: Self-pay

## 2018-03-04 ENCOUNTER — Ambulatory Visit (INDEPENDENT_AMBULATORY_CARE_PROVIDER_SITE_OTHER): Payer: Medicare Other | Admitting: Family Medicine

## 2018-03-04 VITALS — BP 120/68 | HR 82 | Temp 98.3°F | Resp 14 | Ht 64.0 in | Wt 133.8 lb

## 2018-03-04 DIAGNOSIS — E119 Type 2 diabetes mellitus without complications: Secondary | ICD-10-CM | POA: Diagnosis not present

## 2018-03-04 DIAGNOSIS — E038 Other specified hypothyroidism: Secondary | ICD-10-CM | POA: Diagnosis not present

## 2018-03-04 DIAGNOSIS — M810 Age-related osteoporosis without current pathological fracture: Secondary | ICD-10-CM

## 2018-03-04 DIAGNOSIS — I639 Cerebral infarction, unspecified: Secondary | ICD-10-CM | POA: Diagnosis not present

## 2018-03-04 DIAGNOSIS — S0012XA Contusion of left eyelid and periocular area, initial encounter: Secondary | ICD-10-CM

## 2018-03-04 DIAGNOSIS — Z794 Long term (current) use of insulin: Secondary | ICD-10-CM

## 2018-03-04 DIAGNOSIS — F0150 Vascular dementia without behavioral disturbance: Secondary | ICD-10-CM | POA: Diagnosis not present

## 2018-03-04 DIAGNOSIS — I10 Essential (primary) hypertension: Secondary | ICD-10-CM

## 2018-03-04 MED ORDER — DENOSUMAB 60 MG/ML ~~LOC~~ SOSY
60.0000 mg | PREFILLED_SYRINGE | Freq: Once | SUBCUTANEOUS | Status: AC
  Administered 2018-03-04: 60 mg via SUBCUTANEOUS

## 2018-03-04 NOTE — Patient Instructions (Addendum)
Change testing blood sugar, to fasting, 12 noon and bedtime We will call with lab results  F/U 3 months Shon HaleMary Beth or Dr. Tanya NonesPickard

## 2018-03-04 NOTE — Progress Notes (Signed)
Subjective:    Patient ID: Kristin Grant, female    DOB: [birth year], 77 y.o.   MRN: [id number]  Patient presents for Follow-up (is not fasting); S/P Fall (states that she fell on 7/4- noted discoloration to L eye/ orbit); and Injections (Prolia) Here for interim follow-up on her diabetes mellitus.  Her last A1c was 9.1% in May she has had blood sugars have fluctuated all over.  She is currently on Lantus 13 units twice a day along with her oral medications.  Her fasting blood sugars have ranged from 80-1 40s however her sugars at 12 noon are still sometimes upwards of 300.  She has not had any hypoglycemia episodes.  She had a fall on July 4 I do not have any notations from the nursing home in her folder about this.  She states that she had bent over to pick up a piece of paper lost her balance and fell on her face.  She has a left black eye but states she does not have any pain anywhere she did not go to the urgent care in the hospital that we can tell.  She denies any headache any change in her vision states that she feels fine    Review Of Systems:  GEN- denies fatigue, fever, weight loss,weakness, recent illness HEENT- denies eye drainage, change in vision, nasal discharge, CVS- denies chest pain, palpitations RESP- denies SOB, cough, wheeze ABD- denies N/V, change in stools, abd pain GU- denies dysuria, hematuria, dribbling, incontinence MSK- denies joint pain, muscle aches, injury Neuro- denies headache, dizziness, syncope, seizure activity       Objective:    BP 120/68   Pulse 82   Temp 98.3 F (36.8 C) (Oral)   Resp 14   Ht 5\' 4"  (1.626 m)   Wt 133 lb 12.8 oz (60.7 kg)   SpO2 98%   BMI 22.97 kg/m  GEN- NAD, alert and oriented x 2 HEENT- PERRL, EOMI, non injected sclera, pink conjunctiva, MMM, oropharynx clear, yellow bruising around left eye, ecchymosis in corner of eye and below, NT, no palpable hematoma, NT  Neck- Supple, fair ROM CVS- RRR, no  murmur RESP-CTAB ABD-NABS,soft,NT,ND MSK- Decreased ROM upper and lower ext, NT to palpation of extremities  EXT- No edema Pulses- Radial, 2+        Assessment & Plan:      Problem List Items Addressed This Visit      Unprioritized   Dementia   Diabetes mellitus, type II (HCC)    Goal A1C less than 8% Will have them check CBG Fasting, noon and before bedtime Continue lantus  13 units BID and Tradjenta On ACE and ASA      Relevant Orders   Hemoglobin A1c (Completed)   Hypertension - Primary    Well controlled no changes       Relevant Orders   CBC with Differential/Platelet (Completed)   Comprehensive metabolic panel (Completed)   Hypothyroidism    Recheck TSH, continue synthroid      Relevant Orders   TSH (Completed)   Osteoporosis    prolia injection given       Relevant Medications   denosumab (PROLIA) injection 60 mg (Completed)    Other Visit Diagnoses    Black eye of left side, initial encounter       s/p fall, has black eye, no other injury, no change in mentation, no complaint of pain, should resolve over next 1-2 weeks       Note: This  dictation was prepared with Dragon dictation along with smaller phrase technology. Any transcriptional errors that result from this process are unintentional.

## 2018-03-05 ENCOUNTER — Other Ambulatory Visit: Payer: Self-pay | Admitting: *Deleted

## 2018-03-05 ENCOUNTER — Encounter: Payer: Self-pay | Admitting: Family Medicine

## 2018-03-05 ENCOUNTER — Encounter: Payer: Self-pay | Admitting: *Deleted

## 2018-03-05 LAB — CBC WITH DIFFERENTIAL/PLATELET
BASOS PCT: 0.7 %
Basophils Absolute: 53 cells/uL (ref 0–200)
EOS PCT: 3.5 %
Eosinophils Absolute: 266 cells/uL (ref 15–500)
HCT: 42.1 % (ref 35.0–45.0)
HEMOGLOBIN: 13.5 g/dL (ref 11.7–15.5)
Lymphs Abs: 1528 cells/uL (ref 850–3900)
MCH: 26.9 pg — ABNORMAL LOW (ref 27.0–33.0)
MCHC: 32.1 g/dL (ref 32.0–36.0)
MCV: 84 fL (ref 80.0–100.0)
MONOS PCT: 7.3 %
MPV: 13.5 fL — AB (ref 7.5–12.5)
NEUTROS ABS: 5198 {cells}/uL (ref 1500–7800)
Neutrophils Relative %: 68.4 %
PLATELETS: 145 10*3/uL (ref 140–400)
RBC: 5.01 10*6/uL (ref 3.80–5.10)
RDW: 13.9 % (ref 11.0–15.0)
Total Lymphocyte: 20.1 %
WBC mixed population: 555 cells/uL (ref 200–950)
WBC: 7.6 10*3/uL (ref 3.8–10.8)

## 2018-03-05 LAB — COMPREHENSIVE METABOLIC PANEL
AG Ratio: 1.8 (calc) (ref 1.0–2.5)
ALBUMIN MSPROF: 4.1 g/dL (ref 3.6–5.1)
ALT: 9 U/L (ref 6–29)
AST: 11 U/L (ref 10–35)
Alkaline phosphatase (APISO): 71 U/L (ref 33–130)
BUN: 14 mg/dL (ref 7–25)
CO2: 30 mmol/L (ref 20–32)
CREATININE: 0.8 mg/dL (ref 0.60–0.93)
Calcium: 9.4 mg/dL (ref 8.6–10.4)
Chloride: 103 mmol/L (ref 98–110)
GLOBULIN: 2.3 g/dL (ref 1.9–3.7)
GLUCOSE: 251 mg/dL — AB (ref 65–99)
POTASSIUM: 4.7 mmol/L (ref 3.5–5.3)
SODIUM: 143 mmol/L (ref 135–146)
TOTAL PROTEIN: 6.4 g/dL (ref 6.1–8.1)
Total Bilirubin: 0.4 mg/dL (ref 0.2–1.2)

## 2018-03-05 LAB — HEMOGLOBIN A1C
HEMOGLOBIN A1C: 8.4 %{Hb} — AB (ref <5.7)
Mean Plasma Glucose: 194 (calc)
eAG (mmol/L): 10.8 (calc)

## 2018-03-05 LAB — TSH: TSH: 2.3 mIU/L (ref 0.40–4.50)

## 2018-03-05 MED ORDER — INSULIN GLARGINE 100 UNIT/ML SOLOSTAR PEN: PEN_INJECTOR | SUBCUTANEOUS | 3 refills | Status: DC

## 2018-03-05 NOTE — Assessment & Plan Note (Signed)
Recheck TSH, continue synthroid

## 2018-03-05 NOTE — Assessment & Plan Note (Signed)
prolia injection given.

## 2018-03-05 NOTE — Assessment & Plan Note (Signed)
Well controlled no changes 

## 2018-03-05 NOTE — Assessment & Plan Note (Signed)
Goal A1C less than 8% Will have them check CBG Fasting, noon and before bedtime Continue lantus  13 units BID and Tradjenta On ACE and ASA

## 2018-03-23 ENCOUNTER — Other Ambulatory Visit: Payer: Self-pay | Admitting: *Deleted

## 2018-03-23 MED ORDER — INSULIN LISPRO 100 UNIT/ML (KWIKPEN): 5.0000 [IU] | PEN_INJECTOR | Freq: Two times a day (BID) | SUBCUTANEOUS | 11 refills | Status: DC

## 2018-03-26 ENCOUNTER — Ambulatory Visit (INDEPENDENT_AMBULATORY_CARE_PROVIDER_SITE_OTHER): Payer: Medicare Other | Admitting: Adult Health

## 2018-03-26 ENCOUNTER — Encounter: Payer: Self-pay | Admitting: Adult Health

## 2018-03-26 VITALS — BP 132/69 | HR 78 | Ht 64.0 in | Wt 133.6 lb

## 2018-03-26 DIAGNOSIS — I639 Cerebral infarction, unspecified: Secondary | ICD-10-CM | POA: Diagnosis not present

## 2018-03-26 DIAGNOSIS — Z794 Long term (current) use of insulin: Secondary | ICD-10-CM

## 2018-03-26 DIAGNOSIS — E119 Type 2 diabetes mellitus without complications: Secondary | ICD-10-CM

## 2018-03-26 DIAGNOSIS — R2990 Unspecified symptoms and signs involving the nervous system: Secondary | ICD-10-CM

## 2018-03-26 DIAGNOSIS — I10 Essential (primary) hypertension: Secondary | ICD-10-CM

## 2018-03-26 DIAGNOSIS — F0280 Dementia in other diseases classified elsewhere without behavioral disturbance: Secondary | ICD-10-CM | POA: Diagnosis not present

## 2018-03-26 DIAGNOSIS — G300 Alzheimer's disease with early onset: Secondary | ICD-10-CM | POA: Diagnosis not present

## 2018-03-26 NOTE — Progress Notes (Signed)
[facility] [address] [place]. [place] [address] [phone]       OFFICE FOLLOW UP NOTE  [name] Date of Birth:  02/14/1941 Medical Record Number:  [id number]   Reason for Referral:  hospital suspected stroke s/p TPA follow up  CHIEF COMPLAINT:  Chief Complaint  Patient presents with  . Follow-up    Stroke like episodes pt with , pt is at [place] living facility , pt is with Kristin Grant her PCA assistant    HPI: Kristin Grant is being seen today for initial visit in the office for suspected stroke s/p TPA on 11/14/17. History obtained from patient and chart review. Reviewed all radiology images and labs personally.  [name] is a 77 y.o. female with history of Alzheimer's dementia, hypothyroidism, coronary artery disease, diabetes mellitus, hypertension, right shoulder fracture in January and previous stroke presenting with increased confusion and aphasia at [facility]. CT head showed no acute abnormality. Through tele-neurology, it was determined patient should receive IV tPA  and then transferred to [facility]. MRI brain reviewed and showed no acute infarct but did show brain atrophy including midbrain atrophy. CTA and neck showed bilateral V4 stenosis. EEG showed mild background slowing but negative for seizure. 2D echo showed EF of 55-60%. LDL satisfactory at 47. A1c 7.0 and recommended close follow up with PCP. Previously on aspirin and plavix and recommended to continue aspirin and plavix. Patient returned back to assisted living at [facility].   Since discharge, patient's been doing well.  Continues to reside at [facility]. Continues aspirin and Plavix without side effects of myalgias.  Blood pressures today's visit 122/62.  She states that her speech is good and is at her baseline.  Patient does have a history of Alzheimer's dementia and CNA who is present with patient does not believe her memory has worsened.  Patient is disoriented to time and  place.  Recall 0/3.  AFT 7 and clock drawing 0/4.  MMSE performed on patient today in order to get a baseline and monitor memory status.  Denies new or worsening stroke/TIA symptoms.  03/26/18 UPDATE: Patient is being seen today for follow-up and is accompanied by aide from [facility].  Overall she states she has been doing well and her memory has been stable.  Per the aide, she believes her memory has been improving.  She is able to state the date with the day, month and year along with current city that we are in parentheses previous appointment, unable to state month, year or place).  She continues to take aspirin and Plavix with mild bruising but no bleeding.  Blood pressure satisfactory 132/69.  Continues to use rolling walker due to balance.  Denies new or worsening stroke/TIA symptoms.  ROS:   14 system review of systems performed and negative with exception of memory loss  PMH:  Past Medical History:  Diagnosis Date  . Alzheimer disease   . Arthritis   . Coronary artery disease   . Diabetes mellitus   . Difficulty walking   . Hypertension   . Reflux   . Stroke [facility])     PSH:  Past Surgical History:  Procedure Laterality Date  . APPENDECTOMY    . FINGER DEBRIDEMENT      Social History:  Social History   Socioeconomic History  . Marital status: Widowed    Spouse name: Not on file  . Number of children: Not on file  . Years of education: Not  on file  . Highest education level: Not on file  Occupational History  . Not on file  Social Needs  . Financial resource strain: Not on file  . Food insecurity:    Worry: Not on file    Inability: Not on file  . Transportation needs:    Medical: Not on file    Non-medical: Not on file  Tobacco Use  . Smoking status: Never Smoker  . Smokeless tobacco: Never Used  Substance and Sexual Activity  . Alcohol use: No  . Drug use: No  . Sexual activity: Not on file  Lifestyle  . Physical activity:    Days per week: Not on  file    Minutes per session: Not on file  . Stress: Not on file  Relationships  . Social connections:    Talks on phone: Not on file    Gets together: Not on file    Attends religious service: Not on file    Active member of club or organization: Not on file    Attends meetings of clubs or organizations: Not on file    Relationship status: Not on file  . Intimate partner violence:    Fear of current or ex partner: Not on file    Emotionally abused: Not on file    Physically abused: Not on file    Forced sexual activity: Not on file  Other Topics Concern  . Not on file  Social History Narrative  . Not on file    Family History:  Family History  Problem Relation Age of Onset  . Hypertension Mother   . Heart disease Mother   . Hypertension Father   . Heart disease Father   . Hypertension Sister   . Heart disease Sister   . Cancer Sister        BREAST   . Hypertension Brother   . Heart disease Brother     Medications:   Current Outpatient Medications on File Prior to Visit  Medication Sig Dispense Refill  . aspirin 81 MG tablet TAKE 1 TABLET BY MOUTH ONCE DAILY. 30 tablet 0  . Blood Glucose Monitoring Suppl (BLOOD GLUCOSE MONITOR SYSTEM) W/DEVICE KIT 1 Units by Does not apply route daily. 1 each 0  . clopidogrel (PLAVIX) 75 MG tablet TAKE 1 TABLET (57m) BY MOUTH ONCE DAILY. 30 tablet 11  . guaiFENesin-dextromethorphan (ROBITUSSIN DM) 100-10 MG/5ML syrup Give 543mpo TID for 1 week, then as needed 118 mL 2  . Insulin Glargine (LANTUS SOLOSTAR) 100 UNIT/ML Solostar Pen INJECT 15 U SQ Q AM, AND 13 U SQ QHS 15 mL 3  . insulin lispro (HUMALOG KWIKPEN) 100 UNIT/ML KiwkPen Inject 0.05 mLs (5 Units total) into the skin 2 (two) times daily with a meal. With noon and evening meal. 15 mL 11  . Insulin Pen Needle (NOVOFINE AUTOCOVER) 30G X 8 MM MISC USE WITH INSULIN FOR ADMINISTRATION. 100 each 11  . levothyroxine (SYNTHROID, LEVOTHROID) 50 MCG tablet TAKE 1 TABLET BY MOUTH ONCE DAILY.  30 tablet 11  . lisinopril (PRINIVIL,ZESTRIL) 40 MG tablet TAKE 1 TABLET BY MOUTH ONCE DAILY. (Patient taking differently: TAKE 1 TABLET (4022mBY MOUTH ONCE DAILY.) 30 tablet 3  . mirtazapine (REMERON) 15 MG tablet TAKE 1 TABLET BY MOUTH AT BEDTIME. 30 tablet 11  . Multiple Vitamin (DAILY VITE) TABS TAKE 1 TABLET BY MOUTH ONCE DAILY. 30 each 0  . Nutritional Supplements (NUTRITIONAL SUPPLEMENT PO) Take 1 Container by mouth 3 (three) times daily.     .Marland Kitchen  pantoprazole (PROTONIX) 40 MG tablet TAKE 1 TABLET BY MOUTH ONCE DAILY. 30 tablet 11  . propranolol (INDERAL) 40 MG tablet TAKE 1 TABLET BY MOUTH TWICE DAILY. (Patient taking differently: TAKE 1 TABLET (65m) BY MOUTH TWICE DAILY.) 60 tablet 3  . TRADJENTA 5 MG TABS tablet TAKE 1 TABLET BY MOUTH ONCE DAILY. 30 tablet 11  . traMADol (ULTRAM) 50 MG tablet Take 1 tablet (50 mg total) by mouth every 12 (twelve) hours.     No current facility-administered medications on file prior to visit.     Allergies:  No Known Allergies   Physical Exam  Vitals:   03/26/18 1334  BP: 132/69  Pulse: 78  Weight: 133 lb 9.6 oz (60.6 kg)  Height: '5\' 4"'  (1.626 m)   Body mass index is 22.93 kg/m. No exam data present  General: Frail elderly pleasant Caucasian female, seated, in no evident distress Head: head normocephalic and atraumatic.   Neck: supple with no carotid or supraclavicular bruits Cardiovascular: regular rate and rhythm, no murmurs Musculoskeletal: no deformity Skin:  no rash/petichiae Vascular:  Normal pulses all extremities  Neurologic Exam Mental Status: Awake and fully alert.  Mild dysarthria at baseline. oriented to place and time. Attention span, concentration and fund of knowledge appropriate. Mood and affect appropriate.  Cranial Nerves: Fundoscopic exam reveals sharp disc margins. Pupils equal, briskly reactive to light. Extraocular movements full without nystagmus. Visual fields full to confrontation. Hearing intact. Facial  sensation intact. Face, tongue, palate moves normally and symmetrically. Disconjugate gaze with left eye lazy eye, chronic, no vertical eye movement difficulty Motor: Normal bulk and tone. Normal strength in all tested extremity muscles. Sensory.: intact to touch , pinprick , position and vibratory sensation.  Coordination: Rapid alternating movements normal in all extremities. Finger-to-nose and heel-to-shin performed accurately bilaterally. Gait and Station: Arises from chair with mild difficulty. Stance is hunched. Gait demonstrates normal stride length and balance with assistance of rolling walker. Reflexes: 1+ and symmetric. Toes downgoing.    Diagnostic Data (Labs, Imaging, Testing)  CT HEAD WO CONTRAST 11/14/17 IMPRESSION: 1. 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 2. ASPECTS is 10.  CT CEREBRAL PERFUSION W CONTRAST 11/14/17 IMPRESSION: No acute infarct or ischemic penumbra demonstrated by CT perfusion criteria.  CT ANGIO HEAD W OR WO CONTRAST CT ANGIO NECK W OR WO CONTRAST 11/15/17 IMPRESSION: 1. Negative CTA for large vessel occlusion. 2. Atherosclerotic plaque involving the bilateral V4 segments with resultant moderate stenosis on the left and severe stenosis on the right. Left vertebral artery is dominant. Otherwise widely patent vertebrobasilar system. 3. Additional scattered mild atherosclerotic change for age. No other high-grade or correctable stenosis identified.  MR BRAIN WO CONTRAST 11/16/17 IMPRESSION: 1. No acute infarct or hemorrhage. 2. Severe chronic small vessel ischemia. 3. 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  EEG 11/15/17 Impression: This is an abnormal EEG due to mild background slowing seen throughout the tracing.  This is a very non-specific finding that can be seen with toxic, metabolic, diffuse, or multifocal  structural processes, and would not be inconsistent with the patients known history of dementia.  No definite epileptiform changes were noted.   A single EEG without epileptiform changes does not exclude the diagnosis of epilepsy. Clinical correlation advised.  2D ECHOCARDIOGRAM 11/17/17 Study Conclusions - Left ventricle: The cavity size was normal. Wall thickness was   normal. Systolic function was normal. The estimated ejection   fraction was  in the range of 55% to 60%. Wall motion was normal;   there were no regional wall motion abnormalities. Doppler   parameters are consistent with abnormal left ventricular   relaxation (grade 1 diastolic dysfunction). - Ventricular septum: Septal motion showed paradox. These changes   are consistent with a left bundle branch block. - Mitral valve: Moderately calcified annulus. - Pericardium, extracardiac: A trivial pericardial effusion was   identified posterior to the heart.    ASSESSMENT: Kristin Grant is a 77 y.o. year old female here with suspected stroke s/p TPA on 11/14/17. Vascular risk factors include CAD, DM, and HTN.  Returns today for follow-up appointment overall is doing well.    PLAN: -Continue aspirin 81 mg daily and clopidogrel 75 mg daily for secondary stroke prevention -F/u with PCP regarding your HTN, DM and CAD management -continue to monitor BP at home Advised to continue to stay active and maintain a healthy diet- -Maintain strict control of hypertension with blood pressure goal below 130/90, diabetes with hemoglobin A1c goal below 6.5% and cholesterol with LDL cholesterol (bad cholesterol) goal below 70 mg/dL. I also advised the patient to eat a healthy diet with plenty of whole grains, cereals, fruits and vegetables, exercise regularly and maintain ideal body weight.  Follow up in 6 months or call earlier if needed   Greater than 50% time during this 25 minute consultation visit was spent on counseling and coordination of  care about HTN, DM and CAD, discussion about risk benefit of anticoagulation and answering questions.     Venancio Poisson, AGNP-BC  [facility] [address] [place], [place] [address]  Phone [phone] Fax [phone]

## 2018-03-26 NOTE — Patient Instructions (Signed)
Continue aspirin 81 mg daily and clopidogrel 75 mg daily for secondary stroke prevention  Continue to follow up with PCP regarding cholesterol, blood pressure and diabetes management   Continue to stay active and maintain a healthy diet  Continue to monitor blood pressure at home  Maintain strict control of hypertension with blood pressure goal below 130/90, diabetes with hemoglobin A1c goal below 6.5% and cholesterol with LDL cholesterol (bad cholesterol) goal below 70 mg/dL. I also advised the patient to eat a healthy diet with plenty of whole grains, cereals, fruits and vegetables, exercise regularly and maintain ideal body weight.  Followup in the future with me in 6 months or call earlier if needed        [facility] you [facility]. I hope we have been able to provide you high quality care today.  You may receive a patient satisfaction survey over the next few weeks. We would appreciate your feedback and comments so that we may continue to improve ourselves and the health of our patients.

## 2018-04-09 NOTE — Progress Notes (Signed)
I agree with the above plan 

## 2018-04-21 DIAGNOSIS — B351 Tinea unguium: Secondary | ICD-10-CM | POA: Diagnosis not present

## 2018-04-21 DIAGNOSIS — M79674 Pain in right toe(s): Secondary | ICD-10-CM | POA: Diagnosis not present

## 2018-04-21 DIAGNOSIS — M79675 Pain in left toe(s): Secondary | ICD-10-CM | POA: Diagnosis not present

## 2018-05-04 ENCOUNTER — Other Ambulatory Visit: Payer: Self-pay | Admitting: Family Medicine

## 2018-05-04 NOTE — Telephone Encounter (Signed)
Ok to refill??  Last office visit 03/04/2018.  Last refill 11/21/2017.

## 2018-05-20 ENCOUNTER — Encounter (HOSPITAL_COMMUNITY): Payer: Self-pay

## 2018-05-20 ENCOUNTER — Other Ambulatory Visit: Payer: Self-pay

## 2018-05-20 ENCOUNTER — Emergency Department (HOSPITAL_COMMUNITY): Payer: Medicare Other

## 2018-05-20 ENCOUNTER — Emergency Department (HOSPITAL_COMMUNITY)
Admission: EM | Admit: 2018-05-20 | Discharge: 2018-05-20 | Disposition: A | Discharge status: Home / Self Care | Payer: Medicare Other | Attending: Emergency Medicine | Admitting: Emergency Medicine

## 2018-05-20 DIAGNOSIS — Y939 Activity, unspecified: Secondary | ICD-10-CM | POA: Diagnosis not present

## 2018-05-20 DIAGNOSIS — Z7982 Long term (current) use of aspirin: Secondary | ICD-10-CM | POA: Insufficient documentation

## 2018-05-20 DIAGNOSIS — Y999 Unspecified external cause status: Secondary | ICD-10-CM | POA: Diagnosis not present

## 2018-05-20 DIAGNOSIS — M25552 Pain in left hip: Secondary | ICD-10-CM | POA: Diagnosis not present

## 2018-05-20 DIAGNOSIS — E119 Type 2 diabetes mellitus without complications: Secondary | ICD-10-CM | POA: Diagnosis not present

## 2018-05-20 DIAGNOSIS — F0390 Unspecified dementia without behavioral disturbance: Secondary | ICD-10-CM | POA: Diagnosis not present

## 2018-05-20 DIAGNOSIS — I10 Essential (primary) hypertension: Secondary | ICD-10-CM | POA: Diagnosis not present

## 2018-05-20 DIAGNOSIS — S3992XA Unspecified injury of lower back, initial encounter: Secondary | ICD-10-CM | POA: Diagnosis present

## 2018-05-20 DIAGNOSIS — Z79899 Other long term (current) drug therapy: Secondary | ICD-10-CM | POA: Insufficient documentation

## 2018-05-20 DIAGNOSIS — I2510 Atherosclerotic heart disease of native coronary artery without angina pectoris: Secondary | ICD-10-CM | POA: Insufficient documentation

## 2018-05-20 DIAGNOSIS — S79912A Unspecified injury of left hip, initial encounter: Secondary | ICD-10-CM | POA: Diagnosis not present

## 2018-05-20 DIAGNOSIS — Z8673 Personal history of transient ischemic attack (TIA), and cerebral infarction without residual deficits: Secondary | ICD-10-CM | POA: Diagnosis not present

## 2018-05-20 DIAGNOSIS — W19XXXA Unspecified fall, initial encounter: Secondary | ICD-10-CM

## 2018-05-20 DIAGNOSIS — Y9289 Other specified places as the place of occurrence of the external cause: Secondary | ICD-10-CM | POA: Diagnosis not present

## 2018-05-20 DIAGNOSIS — S32010A Wedge compression fracture of first lumbar vertebra, initial encounter for closed fracture: Secondary | ICD-10-CM

## 2018-05-20 DIAGNOSIS — W010XXA Fall on same level from slipping, tripping and stumbling without subsequent striking against object, initial encounter: Secondary | ICD-10-CM | POA: Diagnosis not present

## 2018-05-20 DIAGNOSIS — M25551 Pain in right hip: Secondary | ICD-10-CM | POA: Diagnosis not present

## 2018-05-20 DIAGNOSIS — Z794 Long term (current) use of insulin: Secondary | ICD-10-CM | POA: Insufficient documentation

## 2018-05-20 DIAGNOSIS — S79911A Unspecified injury of right hip, initial encounter: Secondary | ICD-10-CM | POA: Diagnosis not present

## 2018-05-20 MED ORDER — ACETAMINOPHEN 325 MG PO TABS
650.0000 mg | ORAL_TABLET | Freq: Once | ORAL | Status: AC
  Administered 2018-05-20: 650 mg via ORAL
  Filled 2018-05-20: qty 2

## 2018-05-20 MED ORDER — METHOCARBAMOL 500 MG PO TABS: 500.0000 mg | ORAL_TABLET | Freq: Two times a day (BID) | ORAL | 0 refills | Status: DC | PRN

## 2018-05-20 NOTE — ED Provider Notes (Signed)
[facility] EMERGENCY DEPARTMENT Provider Note   CSN: [id number] Arrival date & time: 05/20/18  1341     History   Chief Complaint Chief Complaint  Patient presents with  . Fall    HPI Kristin Grant is a 77 y.o. female.  The history is provided by the patient, a relative, the EMS personnel and the nursing home. The history is limited by the condition of the patient (Hx dementia).  Fall    Pt was seen at 1645. Per EMS, NH report, friend and pt: Pt states she was walking inside from outside the building when she tripped and fell onto her buttocks approximately 10am today. Pt c/o left hip pain and low back pain. Pt's friend states pt was c/o right hip pain also. Pt was ambulatory with her walker immediately after and since the fall.  Denies head injury, no LOC, no AMS from baseline, no neck pain, no CP/SOB, no abd pain, no N/V/D, no focal motor weakness, no tingling/numbness in extremities.    Past Medical History:  Diagnosis Date  . Alzheimer disease   . Arthritis   . Coronary artery disease   . Diabetes mellitus   . Difficulty walking   . Hypertension   . Reflux   . Stroke [facility])     Patient Active Problem List   Diagnosis Date Noted  . Coronary artery disease 11/17/2017  . Thrombocytopenia ([place]) 11/17/2017  . Right arm fracture 11/17/2017  . Cerebral atrophy 11/17/2017  . Hypokalemia 11/17/2017  . History of CVA (cerebrovascular accident) 11/15/2017  . Suspected stroke patient last known to be well 3 to 4.5 hours ago   . Closed fracture of proximal end of right humerus 09/18/17 10/16/2017  . Osteoporosis 01/09/2017  . Loss of weight 01/12/2016  . Stroke-like episode ([place]) s/p IV tPA 11/22/2015  . Diabetes mellitus, type II ([place]) 06/22/2014  . Protein-calorie malnutrition ([place]) 12/15/2013  . Fall 08/17/2013  . Back pain 08/17/2013  . Pressure ulcer, stage 1 07/06/2013  . Abnormal development of nail 03/28/2013  . Gait abnormality 12/10/2011  . Dementia 11/21/2011    . Hypertension 11/21/2011  . Hypothyroidism 11/21/2011    Past Surgical History:  Procedure Laterality Date  . APPENDECTOMY    . FINGER DEBRIDEMENT       OB History   None      Home Medications    Prior to Admission medications   Medication Sig Start Date End Date Taking? Authorizing Provider  aspirin 81 MG tablet TAKE 1 TABLET BY MOUTH ONCE DAILY. 12/08/17   [place], Modena Nunnery, MD  Blood Glucose Monitoring Suppl (BLOOD GLUCOSE MONITOR SYSTEM) W/DEVICE KIT 1 Units by Does not apply route daily. 07/19/13   [name], MD  clopidogrel (PLAVIX) 75 MG tablet TAKE 1 TABLET (30m) BY MOUTH ONCE DAILY. 12/08/17   [place], KModena Nunnery MD  guaiFENesin-dextromethorphan ([facility]) 100-10 MG/5ML syrup Give 561mpo TID for 1 week, then as needed 12/03/17   DuAlycia RossettiMD  Insulin Glargine (LANTUS SOLOSTAR) 100 UNIT/ML Solostar Pen INJECT 15 U SQ Q AM, AND 13 U SQ QHS 03/05/18   [place], KaModena NunneryMD  insulin lispro (HUMALOG KWIKPEN) 100 UNIT/ML KiwkPen Inject 0.05 mLs (5 Units total) into the skin 2 (two) times daily with a meal. With noon and evening meal. 03/23/18   [name], KaModena NunneryMD  Insulin Pen Needle (NOVOFINE AUTOCOVER) 30G X 8 MM MISC USE WITH INSULIN FOR ADMINISTRATION. 10/20/17   DuAlycia RossettiMD  levothyroxine (SYNTHROID, LEVOTHROID) 50 MCG tablet TAKE 1 TABLET BY MOUTH ONCE DAILY. 12/08/17   [place], Modena Nunnery, MD  lisinopril (PRINIVIL,ZESTRIL) 40 MG tablet TAKE 1 TABLET BY MOUTH ONCE DAILY. Patient taking differently: TAKE 1 TABLET (1m) BY MOUTH ONCE DAILY. 08/09/13   DAlycia Rossetti MD  mirtazapine (REMERON) 15 MG tablet TAKE 1 TABLET BY MOUTH AT BEDTIME. 12/08/17   [place], KModena Nunnery MD  Multiple Vitamin (DAILY VITE) TABS TAKE 1 TABLET BY MOUTH ONCE DAILY. 12/08/17   DAlycia Rossetti MD  Nutritional Supplements (NUTRITIONAL SUPPLEMENT PO) Take 1 Container by mouth 3 (three) times daily.     [provider]  pantoprazole (PROTONIX) 40 MG tablet TAKE 1  TABLET BY MOUTH ONCE DAILY. 12/08/17   DAlycia Rossetti MD  propranolol (INDERAL) 40 MG tablet TAKE 1 TABLET BY MOUTH TWICE DAILY. Patient taking differently: TAKE 1 TABLET (429m BY MOUTH TWICE DAILY. 08/09/13   [place], KaModena NunneryMD  TRADJENTA 5 MG TABS tablet TAKE 1 TABLET BY MOUTH ONCE DAILY. 12/08/17   DuAlycia RossettiMD  traMADol (ULTRAM) 50 MG tablet TAKE 1 TABLET BY MOUTH TWICE DAILY. 05/05/18   DiOrlena SheldonPA-C    Family History Family History  Problem Relation Age of Onset  . Hypertension Mother   . Heart disease Mother   . Hypertension Father   . Heart disease Father   . Hypertension Sister   . Heart disease Sister   . Cancer Sister        BREAST   . Hypertension Brother   . Heart disease Brother     Social History Social History   Tobacco Use  . Smoking status: Never Smoker  . Smokeless tobacco: Never Used  Substance Use Topics  . Alcohol use: No  . Drug use: No     Allergies   Patient has no known allergies.   Review of Systems Review of Systems  Unable to perform ROS: Dementia     Physical Exam Updated Vital Signs BP (!) 168/70 (BP Location: Right Arm)   Pulse 94   Temp 99.6 F (37.6 C) (Oral)   Resp 16   Ht '5\' 6"'  (1.676 m)   Wt 60 kg   SpO2 96%   BMI 21.35 kg/m   Physical Exam 1650: Physical examination: Vital signs and O2 SAT: Reviewed; Constitutional: Well developed, Well nourished, Well hydrated, In no acute distress; Head and Face: Normocephalic, Atraumatic; Eyes: EOMI, PERRL, No scleral icterus; ENMT: Mouth and pharynx normal, Mucous membranes moist; Neck: Supple, Trachea midline. No abrasions or ecchymosis.; Spine: +mild TTP left lumbar paraspinal muscles and upper sacral areas.  No midline CS, TS, LS tenderness.; Cardiovascular: Regular rate and rhythm, No gallop; Respiratory: Breath sounds clear & equal bilaterally, No wheezes, Normal respiratory effort/excursion; Chest: Nontender, No deformity, Movement normal, No crepitus, No  abrasions or ecchymosis.; Abdomen: Soft, Nontender, Nondistended, Normal bowel sounds, No abrasions or ecchymosis.; Genitourinary: No CVA tenderness;; Extremities:  +mild left hip tenderness to palp, no deformity. NT left knee/ankle/foot. Full range of motion major/large joints of bilat UE's and LE's without pain or tenderness to palp, Neurovascularly intact, Pulses normal, No deformity. No tenderness, No edema, Pelvis stable; Neuro: Awake, alert. No facial droop. Major CN grossly intact. Speech clear. No gross focal motor or sensory deficits in extremities.; Skin: Color normal, Warm, Dry   ED Treatments / Results  Labs (all labs ordered are listed, but only abnormal results are displayed)   EKG None  Radiology  Procedures Procedures (including critical care time)  Medications Ordered in ED Medications  acetaminophen (TYLENOL) tablet 650 mg (650 mg Oral Given 05/20/18 1655)     Initial Impression / Assessment and Plan / ED Course  I have reviewed the triage vital signs and the nursing notes.  Pertinent labs & imaging results that were available during my care of the patient were reviewed by me and considered in my medical decision making (see chart for details).  MDM Reviewed: previous chart, nursing note and vitals Interpretation: labs and x-ray   Dg Lumbar Spine Complete Result Date: 05/20/2018 CLINICAL DATA:  Fall onto buttocks.  Hip pain. EXAM: LUMBAR SPINE - COMPLETE 4+ VIEW COMPARISON:  08/13/2013 FINDINGS: Diffuse advanced degenerative facet disease and disc disease throughout the lumbar spine. Moderate L1 compression fracture is new since 2014, age indeterminate. SI joints symmetric and unremarkable. IMPRESSION: Moderate L1 compression fracture, age indeterminate, new since 2014. Advanced diffuse degenerative disc and facet disease. Electronically Signed   By: Rolm Baptise M.D.   On: 05/20/2018 18:08   Dg Hips Bilat W Or Wo Pelvis 5 Views Result Date: 05/20/2018 CLINICAL  DATA:  Fall, low back pain and bilateral hip pain EXAM: DG HIP (WITH OR WITHOUT PELVIS) 5+V BILAT COMPARISON:  None. FINDINGS: Mild symmetric degenerative changes in the hips bilaterally. SI joints symmetric and unremarkable. No acute bony abnormality. Specifically, no fracture, subluxation, or dislocation. IMPRESSION: Mild degenerative changes in the hips.  No acute bony abnormality. Electronically Signed   By: Rolm Baptise M.D.   On: 05/20/2018 18:08    1825:  XR LS with unknown age L1 compression fx. Tx symptomatically, f/u PMD. Dx and testing d/w pt and family/friend.  Questions answered.  Verb understanding, agreeable to d/c back to NH with outpt f/u.   Final Clinical Impressions(s) / ED Diagnoses   Final diagnoses:  None    ED Discharge Orders    None       Francine Graven, DO 05/22/18 2111

## 2018-05-20 NOTE — ED Notes (Signed)
Pt taken to xray 

## 2018-05-20 NOTE — Discharge Instructions (Signed)
Take the prescription as directed. Take over the counter tylenol, as directed on packaging, as needed for discomfort. Walk with your walker at all times. Apply moist heat or ice to the area(s) of discomfort, for 15 minutes at a time, several times per day for the next few days.  Do not fall asleep on a heating or ice pack.  Call your regular medical doctor tomorrow to schedule a follow up appointment in the next 2 to 3 days.  Return to the Emergency Department immediately if worsening.

## 2018-05-20 NOTE — ED Triage Notes (Signed)
Pt resident of [facility].  Reports she tripped earlier today and c/o back pain now.  Staff denies pt hitting head.  No loss of consciousness.

## 2018-05-26 DIAGNOSIS — M6281 Muscle weakness (generalized): Secondary | ICD-10-CM | POA: Diagnosis not present

## 2018-05-26 DIAGNOSIS — R2689 Other abnormalities of gait and mobility: Secondary | ICD-10-CM | POA: Diagnosis not present

## 2018-05-28 DIAGNOSIS — R2689 Other abnormalities of gait and mobility: Secondary | ICD-10-CM | POA: Diagnosis not present

## 2018-05-28 DIAGNOSIS — M6281 Muscle weakness (generalized): Secondary | ICD-10-CM | POA: Diagnosis not present

## 2018-06-02 DIAGNOSIS — R2689 Other abnormalities of gait and mobility: Secondary | ICD-10-CM | POA: Diagnosis not present

## 2018-06-02 DIAGNOSIS — M6281 Muscle weakness (generalized): Secondary | ICD-10-CM | POA: Diagnosis not present

## 2018-06-04 ENCOUNTER — Ambulatory Visit (INDEPENDENT_AMBULATORY_CARE_PROVIDER_SITE_OTHER): Payer: Medicare Other | Admitting: Family Medicine

## 2018-06-04 ENCOUNTER — Encounter: Payer: Self-pay | Admitting: Family Medicine

## 2018-06-04 VITALS — BP 138/60 | HR 78 | Temp 98.0°F | Resp 16 | Wt 134.0 lb

## 2018-06-04 DIAGNOSIS — E038 Other specified hypothyroidism: Secondary | ICD-10-CM | POA: Diagnosis not present

## 2018-06-04 DIAGNOSIS — Z794 Long term (current) use of insulin: Secondary | ICD-10-CM | POA: Diagnosis not present

## 2018-06-04 DIAGNOSIS — Z23 Encounter for immunization: Secondary | ICD-10-CM

## 2018-06-04 DIAGNOSIS — E039 Hypothyroidism, unspecified: Secondary | ICD-10-CM | POA: Diagnosis not present

## 2018-06-04 DIAGNOSIS — I639 Cerebral infarction, unspecified: Secondary | ICD-10-CM | POA: Diagnosis not present

## 2018-06-04 DIAGNOSIS — I10 Essential (primary) hypertension: Secondary | ICD-10-CM | POA: Diagnosis not present

## 2018-06-04 DIAGNOSIS — F0150 Vascular dementia without behavioral disturbance: Secondary | ICD-10-CM

## 2018-06-04 DIAGNOSIS — E118 Type 2 diabetes mellitus with unspecified complications: Secondary | ICD-10-CM

## 2018-06-04 MED ORDER — GUAIFENESIN-DM 100-10 MG/5ML PO SYRP: ORAL_SOLUTION | ORAL | 2 refills | Status: DC

## 2018-06-04 NOTE — Addendum Note (Signed)
Addended by: Legrand Rams B on: 06/04/2018 12:57 PM   Modules accepted: Orders

## 2018-06-04 NOTE — Progress Notes (Signed)
Subjective:    Patient ID: Kristin Grant, female    DOB: Feb 21, 1941, 77 y.o.   MRN: [id number]  HPI  Patient is here today for follow-up.  Patient has insulin-dependent diabetes mellitus, dementia.  It points in her chart I see a classified as Alzheimer's disease.  At other points it is listed as vascular dementia.  She does have a history of stroke.  She also has a history of hyperlipidemia and hypertension.  Her most recent hemoglobin A1c was in July and it was 8.3.  She is currently on Lantus 15 units in the morning 13 units in the evening.  She also is still taking a sulfonylurea along with Tradjenta.  Patient is accompanied by an assistant from the nursing home.  There are no recorded sugars for me to review.  Patient is unable to provide any objective sugars to review.  I asked her about signs and symptoms of hypoglycemia.  She denies feeling shaky or jittery.  She denies any tachycardia.  She denies feeling lightheaded or weak.  She denies polyuria, polydipsia, or blurry vision.  She denies any burning or numbness in her feet.  She denies any claudication in her legs.  She denies any chest pain, shortness of breath, or dyspnea on exertion.  She is due today for her flu shot.  Diabetic foot exam is performed and is normal.  Past Medical History:  Diagnosis Date  . Alzheimer disease ([place])   . Arthritis   . Coronary artery disease   . Diabetes mellitus   . Difficulty walking   . Hypertension   . Reflux   . Stroke [facility])    Past Surgical History:  Procedure Laterality Date  . APPENDECTOMY    . FINGER DEBRIDEMENT     Current Outpatient Medications on File Prior to Visit  Medication Sig Dispense Refill  . aspirin 81 MG tablet TAKE 1 TABLET BY MOUTH ONCE DAILY. 30 tablet 0  . Blood Glucose Monitoring Suppl (BLOOD GLUCOSE MONITOR SYSTEM) W/DEVICE KIT 1 Units by Does not apply route daily. 1 each 0  . clopidogrel (PLAVIX) 75 MG tablet TAKE 1 TABLET ('75mg'$ ) BY MOUTH ONCE DAILY. 30 tablet 11    . Insulin Glargine (LANTUS SOLOSTAR) 100 UNIT/ML Solostar Pen INJECT 15 U SQ Q AM, AND 13 U SQ QHS 15 mL 3  . insulin lispro (HUMALOG KWIKPEN) 100 UNIT/ML KiwkPen Inject 0.05 mLs (5 Units total) into the skin 2 (two) times daily with a meal. With noon and evening meal. 15 mL 11  . Insulin Pen Needle (NOVOFINE AUTOCOVER) 30G X 8 MM MISC USE WITH INSULIN FOR ADMINISTRATION. 100 each 11  . levothyroxine (SYNTHROID, LEVOTHROID) 50 MCG tablet TAKE 1 TABLET BY MOUTH ONCE DAILY. 30 tablet 11  . lisinopril (PRINIVIL,ZESTRIL) 40 MG tablet TAKE 1 TABLET BY MOUTH ONCE DAILY. (Patient taking differently: TAKE 1 TABLET ('40mg'$ ) BY MOUTH ONCE DAILY.) 30 tablet 3  . methocarbamol (ROBAXIN) 500 MG tablet Take 1 tablet (500 mg total) by mouth 2 (two) times daily as needed for muscle spasms. 8 tablet 0  . mirtazapine (REMERON) 15 MG tablet TAKE 1 TABLET BY MOUTH AT BEDTIME. 30 tablet 11  . Multiple Vitamin (DAILY VITE) TABS TAKE 1 TABLET BY MOUTH ONCE DAILY. 30 each 0  . Nutritional Supplements (NUTRITIONAL SUPPLEMENT PO) Take 1 Container by mouth 3 (three) times daily.     . pantoprazole (PROTONIX) 40 MG tablet TAKE 1 TABLET BY MOUTH ONCE DAILY. 30 tablet 11  . propranolol (INDERAL)  40 MG tablet TAKE 1 TABLET BY MOUTH TWICE DAILY. (Patient taking differently: TAKE 1 TABLET ('40mg'$ ) BY MOUTH TWICE DAILY.) 60 tablet 3  . TRADJENTA 5 MG TABS tablet TAKE 1 TABLET BY MOUTH ONCE DAILY. 30 tablet 11  . traMADol (ULTRAM) 50 MG tablet TAKE 1 TABLET BY MOUTH TWICE DAILY. 60 tablet 0   No current facility-administered medications on file prior to visit.    No Known Allergies Social History   Socioeconomic History  . Marital status: Widowed    Spouse name: Not on file  . Number of children: Not on file  . Years of education: Not on file  . Highest education level: Not on file  Occupational History  . Not on file  Social Needs  . Financial resource strain: Not on file  . Food insecurity:    Worry: Not on file     Inability: Not on file  . Transportation needs:    Medical: Not on file    Non-medical: Not on file  Tobacco Use  . Smoking status: Never Smoker  . Smokeless tobacco: Never Used  Substance and Sexual Activity  . Alcohol use: No  . Drug use: No  . Sexual activity: Not on file  Lifestyle  . Physical activity:    Days per week: Not on file    Minutes per session: Not on file  . Stress: Not on file  Relationships  . Social connections:    Talks on phone: Not on file    Gets together: Not on file    Attends religious service: Not on file    Active member of club or organization: Not on file    Attends meetings of clubs or organizations: Not on file    Relationship status: Not on file  . Intimate partner violence:    Fear of current or ex partner: Not on file    Emotionally abused: Not on file    Physically abused: Not on file    Forced sexual activity: Not on file  Other Topics Concern  . Not on file  Social History Narrative  . Not on file      Review of Systems  All other systems reviewed and are negative.      Objective:   Physical Exam  Constitutional: She appears well-developed and well-nourished. No distress.  Eyes: Pupils are equal, round, and reactive to light. Conjunctivae are normal. No scleral icterus.  Neck: No JVD present. No thyromegaly present.  Cardiovascular: Normal rate, regular rhythm, normal heart sounds and intact distal pulses. Exam reveals no gallop and no friction rub.  No murmur heard. Pulmonary/Chest: Effort normal and breath sounds normal. No stridor. No respiratory distress. She has no wheezes. She has no rales. She exhibits no tenderness.  Abdominal: Soft. Bowel sounds are normal. She exhibits no distension and no mass. There is no tenderness. There is no rebound and no guarding. No hernia.  Musculoskeletal: She exhibits no edema.  Lymphadenopathy:    She has no cervical adenopathy.  Skin: She is not diaphoretic. No erythema.  Vitals  reviewed.         Assessment & Plan:  Controlled type 2 diabetes mellitus with complication, with long-term current use of insulin (HCC) - Plan: Hemoglobin A1c, COMPLETE METABOLIC PANEL WITH GFR, CBC with Differential/Platelet  Hypothyroidism, unspecified type - Plan: TSH  Vascular dementia without behavioral disturbance ([place])  Other specified hypothyroidism  Essential hypertension  Patient's blood pressure today is well controlled at 138/60.  I would  make no changes in her antihypertensives.  I will check a TSH to ensure adequate dosage of her levothyroxine given her advanced age and frail stature.  Regarding her diabetes, I will check a hemoglobin A1c.  Ideally I would like her hemoglobin A1c to be less than 7.5.  I think this would be a good compromise for glycemic control but avoiding hypoglycemia.  I question if she is receiving benefit from the sulfonic urea at this point given the fact she is on long-acting insulin.  Patient may be a good candidate if hemoglobin A1c is greater than 8 to switch sulfonylurea to Trulicity.  I believe this will reduce the chance of hypoglycemia as well as affording better glycemic control.  We would have to monitor for nausea.  Await to see the results of her A1c.. Goal LDL cholesterol is less than 70 given her history of stroke and coronary artery disease.  She received her flu shot today

## 2018-06-05 LAB — CBC WITH DIFFERENTIAL/PLATELET
BASOS ABS: 66 {cells}/uL (ref 0–200)
BASOS PCT: 0.7 %
EOS ABS: 320 {cells}/uL (ref 15–500)
Eosinophils Relative: 3.4 %
HCT: 40.9 % (ref 35.0–45.0)
HEMOGLOBIN: 12.9 g/dL (ref 11.7–15.5)
Lymphs Abs: 1448 cells/uL (ref 850–3900)
MCH: 26.4 pg — AB (ref 27.0–33.0)
MCHC: 31.5 g/dL — AB (ref 32.0–36.0)
MCV: 83.6 fL (ref 80.0–100.0)
MONOS PCT: 7.7 %
MPV: 12.5 fL (ref 7.5–12.5)
Neutro Abs: 6843 cells/uL (ref 1500–7800)
Neutrophils Relative %: 72.8 %
Platelets: 184 10*3/uL (ref 140–400)
RBC: 4.89 10*6/uL (ref 3.80–5.10)
RDW: 14.2 % (ref 11.0–15.0)
TOTAL LYMPHOCYTE: 15.4 %
WBC: 9.4 10*3/uL (ref 3.8–10.8)
WBCMIX: 724 {cells}/uL (ref 200–950)

## 2018-06-05 LAB — COMPLETE METABOLIC PANEL WITH GFR
AG RATIO: 1.5 (calc) (ref 1.0–2.5)
ALT: 9 U/L (ref 6–29)
AST: 12 U/L (ref 10–35)
Albumin: 3.7 g/dL (ref 3.6–5.1)
Alkaline phosphatase (APISO): 88 U/L (ref 33–130)
BUN: 15 mg/dL (ref 7–25)
CALCIUM: 9.1 mg/dL (ref 8.6–10.4)
CO2: 31 mmol/L (ref 20–32)
CREATININE: 0.82 mg/dL (ref 0.60–0.93)
Chloride: 103 mmol/L (ref 98–110)
GFR, EST AFRICAN AMERICAN: 79 mL/min/{1.73_m2} (ref >=60)
GFR, EST NON AFRICAN AMERICAN: 68 mL/min/{1.73_m2} (ref >=60)
Globulin: 2.5 g/dL (calc) (ref 1.9–3.7)
Glucose, Bld: 218 mg/dL — ABNORMAL HIGH (ref 65–99)
Potassium: 5.1 mmol/L (ref 3.5–5.3)
Sodium: 142 mmol/L (ref 135–146)
Total Bilirubin: 0.4 mg/dL (ref 0.2–1.2)
Total Protein: 6.2 g/dL (ref 6.1–8.1)

## 2018-06-05 LAB — HEMOGLOBIN A1C
Hgb A1c MFr Bld: 6.7 % of total Hgb — ABNORMAL HIGH (ref <5.7)
Mean Plasma Glucose: 146 (calc)
eAG (mmol/L): 8.1 (calc)

## 2018-06-05 LAB — TSH: TSH: 2.52 mIU/L (ref 0.40–4.50)

## 2018-06-09 ENCOUNTER — Encounter: Payer: Self-pay | Admitting: Family Medicine

## 2018-06-09 DIAGNOSIS — R2689 Other abnormalities of gait and mobility: Secondary | ICD-10-CM | POA: Diagnosis not present

## 2018-06-09 DIAGNOSIS — M6281 Muscle weakness (generalized): Secondary | ICD-10-CM | POA: Diagnosis not present

## 2018-06-11 DIAGNOSIS — M6281 Muscle weakness (generalized): Secondary | ICD-10-CM | POA: Diagnosis not present

## 2018-06-11 DIAGNOSIS — R2689 Other abnormalities of gait and mobility: Secondary | ICD-10-CM | POA: Diagnosis not present

## 2018-06-15 DIAGNOSIS — M6281 Muscle weakness (generalized): Secondary | ICD-10-CM | POA: Diagnosis not present

## 2018-06-16 DIAGNOSIS — M6281 Muscle weakness (generalized): Secondary | ICD-10-CM | POA: Diagnosis not present

## 2018-06-16 DIAGNOSIS — R2689 Other abnormalities of gait and mobility: Secondary | ICD-10-CM | POA: Diagnosis not present

## 2018-06-18 DIAGNOSIS — R2689 Other abnormalities of gait and mobility: Secondary | ICD-10-CM | POA: Diagnosis not present

## 2018-06-18 DIAGNOSIS — M6281 Muscle weakness (generalized): Secondary | ICD-10-CM | POA: Diagnosis not present

## 2018-06-23 DIAGNOSIS — R2689 Other abnormalities of gait and mobility: Secondary | ICD-10-CM | POA: Diagnosis not present

## 2018-06-23 DIAGNOSIS — M6281 Muscle weakness (generalized): Secondary | ICD-10-CM | POA: Diagnosis not present

## 2018-06-24 DIAGNOSIS — M6281 Muscle weakness (generalized): Secondary | ICD-10-CM | POA: Diagnosis not present

## 2018-06-25 DIAGNOSIS — M6281 Muscle weakness (generalized): Secondary | ICD-10-CM | POA: Diagnosis not present

## 2018-06-27 DIAGNOSIS — M6281 Muscle weakness (generalized): Secondary | ICD-10-CM | POA: Diagnosis not present

## 2018-06-27 DIAGNOSIS — R2689 Other abnormalities of gait and mobility: Secondary | ICD-10-CM | POA: Diagnosis not present

## 2018-06-29 DIAGNOSIS — R2689 Other abnormalities of gait and mobility: Secondary | ICD-10-CM | POA: Diagnosis not present

## 2018-06-29 DIAGNOSIS — M6281 Muscle weakness (generalized): Secondary | ICD-10-CM | POA: Diagnosis not present

## 2018-06-30 DIAGNOSIS — M6281 Muscle weakness (generalized): Secondary | ICD-10-CM | POA: Diagnosis not present

## 2018-07-02 DIAGNOSIS — M6281 Muscle weakness (generalized): Secondary | ICD-10-CM | POA: Diagnosis not present

## 2018-07-02 DIAGNOSIS — R2689 Other abnormalities of gait and mobility: Secondary | ICD-10-CM | POA: Diagnosis not present

## 2018-07-07 DIAGNOSIS — R2689 Other abnormalities of gait and mobility: Secondary | ICD-10-CM | POA: Diagnosis not present

## 2018-07-07 DIAGNOSIS — M6281 Muscle weakness (generalized): Secondary | ICD-10-CM | POA: Diagnosis not present

## 2018-07-09 DIAGNOSIS — M6281 Muscle weakness (generalized): Secondary | ICD-10-CM | POA: Diagnosis not present

## 2018-07-09 DIAGNOSIS — R2689 Other abnormalities of gait and mobility: Secondary | ICD-10-CM | POA: Diagnosis not present

## 2018-07-12 DIAGNOSIS — M6281 Muscle weakness (generalized): Secondary | ICD-10-CM | POA: Diagnosis not present

## 2018-07-14 DIAGNOSIS — M6281 Muscle weakness (generalized): Secondary | ICD-10-CM | POA: Diagnosis not present

## 2018-07-15 DIAGNOSIS — H25813 Combined forms of age-related cataract, bilateral: Secondary | ICD-10-CM | POA: Diagnosis not present

## 2018-07-15 DIAGNOSIS — Z7984 Long term (current) use of oral hypoglycemic drugs: Secondary | ICD-10-CM | POA: Diagnosis not present

## 2018-07-15 DIAGNOSIS — E119 Type 2 diabetes mellitus without complications: Secondary | ICD-10-CM | POA: Diagnosis not present

## 2018-07-16 DIAGNOSIS — R2689 Other abnormalities of gait and mobility: Secondary | ICD-10-CM | POA: Diagnosis not present

## 2018-07-16 DIAGNOSIS — M6281 Muscle weakness (generalized): Secondary | ICD-10-CM | POA: Diagnosis not present

## 2018-07-21 DIAGNOSIS — R2689 Other abnormalities of gait and mobility: Secondary | ICD-10-CM | POA: Diagnosis not present

## 2018-07-21 DIAGNOSIS — M6281 Muscle weakness (generalized): Secondary | ICD-10-CM | POA: Diagnosis not present

## 2018-07-22 DIAGNOSIS — M6281 Muscle weakness (generalized): Secondary | ICD-10-CM | POA: Diagnosis not present

## 2018-07-22 DIAGNOSIS — R2689 Other abnormalities of gait and mobility: Secondary | ICD-10-CM | POA: Diagnosis not present

## 2018-07-23 DIAGNOSIS — M6281 Muscle weakness (generalized): Secondary | ICD-10-CM | POA: Diagnosis not present

## 2018-07-28 DIAGNOSIS — M79675 Pain in left toe(s): Secondary | ICD-10-CM | POA: Diagnosis not present

## 2018-07-28 DIAGNOSIS — M6281 Muscle weakness (generalized): Secondary | ICD-10-CM | POA: Diagnosis not present

## 2018-07-28 DIAGNOSIS — R2689 Other abnormalities of gait and mobility: Secondary | ICD-10-CM | POA: Diagnosis not present

## 2018-07-28 DIAGNOSIS — B351 Tinea unguium: Secondary | ICD-10-CM | POA: Diagnosis not present

## 2018-07-28 DIAGNOSIS — M79674 Pain in right toe(s): Secondary | ICD-10-CM | POA: Diagnosis not present

## 2018-07-30 ENCOUNTER — Other Ambulatory Visit: Payer: Self-pay | Admitting: *Deleted

## 2018-07-30 DIAGNOSIS — R2689 Other abnormalities of gait and mobility: Secondary | ICD-10-CM | POA: Diagnosis not present

## 2018-07-30 DIAGNOSIS — M6281 Muscle weakness (generalized): Secondary | ICD-10-CM | POA: Diagnosis not present

## 2018-07-30 MED ORDER — INSULIN LISPRO 100 UNIT/ML CARTRIDGE: SUBCUTANEOUS | 11 refills | Status: DC

## 2018-08-02 ENCOUNTER — Inpatient Hospital Stay (HOSPITAL_COMMUNITY)
Admission: EM | Admit: 2018-08-02 | Discharge: 2018-08-06 | DRG: 480 | Disposition: A | Discharge status: Skilled Nursing Facility | Payer: Medicare Other | Attending: Internal Medicine | Admitting: Internal Medicine

## 2018-08-02 DIAGNOSIS — W1830XA Fall on same level, unspecified, initial encounter: Secondary | ICD-10-CM | POA: Diagnosis present

## 2018-08-02 DIAGNOSIS — G309 Alzheimer's disease, unspecified: Secondary | ICD-10-CM | POA: Diagnosis present

## 2018-08-02 DIAGNOSIS — E119 Type 2 diabetes mellitus without complications: Secondary | ICD-10-CM

## 2018-08-02 DIAGNOSIS — E1140 Type 2 diabetes mellitus with diabetic neuropathy, unspecified: Secondary | ICD-10-CM | POA: Diagnosis present

## 2018-08-02 DIAGNOSIS — I10 Essential (primary) hypertension: Secondary | ICD-10-CM | POA: Diagnosis present

## 2018-08-02 DIAGNOSIS — N390 Urinary tract infection, site not specified: Secondary | ICD-10-CM | POA: Diagnosis present

## 2018-08-02 DIAGNOSIS — F0390 Unspecified dementia without behavioral disturbance: Secondary | ICD-10-CM | POA: Diagnosis present

## 2018-08-02 DIAGNOSIS — S72141A Displaced intertrochanteric fracture of right femur, initial encounter for closed fracture: Secondary | ICD-10-CM | POA: Diagnosis not present

## 2018-08-02 DIAGNOSIS — E440 Moderate protein-calorie malnutrition: Secondary | ICD-10-CM | POA: Diagnosis present

## 2018-08-02 DIAGNOSIS — R451 Restlessness and agitation: Secondary | ICD-10-CM | POA: Diagnosis present

## 2018-08-02 DIAGNOSIS — G9341 Metabolic encephalopathy: Secondary | ICD-10-CM | POA: Diagnosis present

## 2018-08-02 DIAGNOSIS — Z79899 Other long term (current) drug therapy: Secondary | ICD-10-CM

## 2018-08-02 DIAGNOSIS — D62 Acute posthemorrhagic anemia: Secondary | ICD-10-CM | POA: Diagnosis not present

## 2018-08-02 DIAGNOSIS — Y92129 Unspecified place in nursing home as the place of occurrence of the external cause: Secondary | ICD-10-CM

## 2018-08-02 DIAGNOSIS — I2510 Atherosclerotic heart disease of native coronary artery without angina pectoris: Secondary | ICD-10-CM | POA: Diagnosis present

## 2018-08-02 DIAGNOSIS — E039 Hypothyroidism, unspecified: Secondary | ICD-10-CM | POA: Diagnosis present

## 2018-08-02 DIAGNOSIS — T148XXA Other injury of unspecified body region, initial encounter: Secondary | ICD-10-CM

## 2018-08-02 DIAGNOSIS — Z7982 Long term (current) use of aspirin: Secondary | ICD-10-CM

## 2018-08-02 DIAGNOSIS — Z6824 Body mass index (BMI) 24.0-24.9, adult: Secondary | ICD-10-CM

## 2018-08-02 DIAGNOSIS — Z8673 Personal history of transient ischemic attack (TIA), and cerebral infarction without residual deficits: Secondary | ICD-10-CM

## 2018-08-02 DIAGNOSIS — R52 Pain, unspecified: Secondary | ICD-10-CM | POA: Diagnosis not present

## 2018-08-02 DIAGNOSIS — M79604 Pain in right leg: Secondary | ICD-10-CM | POA: Diagnosis not present

## 2018-08-02 DIAGNOSIS — S299XXA Unspecified injury of thorax, initial encounter: Secondary | ICD-10-CM | POA: Diagnosis not present

## 2018-08-02 DIAGNOSIS — W19XXXA Unspecified fall, initial encounter: Secondary | ICD-10-CM

## 2018-08-02 DIAGNOSIS — F0280 Dementia in other diseases classified elsewhere without behavioral disturbance: Secondary | ICD-10-CM | POA: Diagnosis present

## 2018-08-02 DIAGNOSIS — K219 Gastro-esophageal reflux disease without esophagitis: Secondary | ICD-10-CM | POA: Diagnosis present

## 2018-08-02 DIAGNOSIS — Z7902 Long term (current) use of antithrombotics/antiplatelets: Secondary | ICD-10-CM

## 2018-08-02 DIAGNOSIS — Z794 Long term (current) use of insulin: Secondary | ICD-10-CM

## 2018-08-03 ENCOUNTER — Inpatient Hospital Stay (HOSPITAL_COMMUNITY): Payer: Medicare Other | Admitting: Anesthesiology

## 2018-08-03 ENCOUNTER — Other Ambulatory Visit: Payer: Self-pay

## 2018-08-03 ENCOUNTER — Encounter (HOSPITAL_COMMUNITY)
Admission: EM | Disposition: A | Discharge status: Skilled Nursing Facility | Payer: Self-pay | Source: Home / Self Care | Attending: Internal Medicine

## 2018-08-03 ENCOUNTER — Encounter (HOSPITAL_COMMUNITY): Payer: Self-pay | Admitting: *Deleted

## 2018-08-03 ENCOUNTER — Inpatient Hospital Stay (HOSPITAL_COMMUNITY): Payer: Medicare Other

## 2018-08-03 ENCOUNTER — Emergency Department (HOSPITAL_COMMUNITY): Payer: Medicare Other

## 2018-08-03 DIAGNOSIS — R451 Restlessness and agitation: Secondary | ICD-10-CM | POA: Diagnosis present

## 2018-08-03 DIAGNOSIS — S72141A Displaced intertrochanteric fracture of right femur, initial encounter for closed fracture: Secondary | ICD-10-CM | POA: Diagnosis present

## 2018-08-03 DIAGNOSIS — I2510 Atherosclerotic heart disease of native coronary artery without angina pectoris: Secondary | ICD-10-CM | POA: Diagnosis present

## 2018-08-03 DIAGNOSIS — Z79899 Other long term (current) drug therapy: Secondary | ICD-10-CM | POA: Diagnosis not present

## 2018-08-03 DIAGNOSIS — M6281 Muscle weakness (generalized): Secondary | ICD-10-CM | POA: Diagnosis not present

## 2018-08-03 DIAGNOSIS — K219 Gastro-esophageal reflux disease without esophagitis: Secondary | ICD-10-CM | POA: Diagnosis present

## 2018-08-03 DIAGNOSIS — Z8673 Personal history of transient ischemic attack (TIA), and cerebral infarction without residual deficits: Secondary | ICD-10-CM | POA: Diagnosis not present

## 2018-08-03 DIAGNOSIS — F0280 Dementia in other diseases classified elsewhere without behavioral disturbance: Secondary | ICD-10-CM

## 2018-08-03 DIAGNOSIS — R1312 Dysphagia, oropharyngeal phase: Secondary | ICD-10-CM | POA: Diagnosis not present

## 2018-08-03 DIAGNOSIS — Z7982 Long term (current) use of aspirin: Secondary | ICD-10-CM | POA: Diagnosis not present

## 2018-08-03 DIAGNOSIS — G9341 Metabolic encephalopathy: Secondary | ICD-10-CM | POA: Diagnosis present

## 2018-08-03 DIAGNOSIS — I10 Essential (primary) hypertension: Secondary | ICD-10-CM | POA: Diagnosis present

## 2018-08-03 DIAGNOSIS — E440 Moderate protein-calorie malnutrition: Secondary | ICD-10-CM | POA: Diagnosis present

## 2018-08-03 DIAGNOSIS — W19XXXA Unspecified fall, initial encounter: Secondary | ICD-10-CM | POA: Diagnosis not present

## 2018-08-03 DIAGNOSIS — E119 Type 2 diabetes mellitus without complications: Secondary | ICD-10-CM

## 2018-08-03 DIAGNOSIS — E039 Hypothyroidism, unspecified: Secondary | ICD-10-CM | POA: Diagnosis present

## 2018-08-03 DIAGNOSIS — Z7401 Bed confinement status: Secondary | ICD-10-CM | POA: Diagnosis not present

## 2018-08-03 DIAGNOSIS — M2550 Pain in unspecified joint: Secondary | ICD-10-CM | POA: Diagnosis not present

## 2018-08-03 DIAGNOSIS — R262 Difficulty in walking, not elsewhere classified: Secondary | ICD-10-CM | POA: Diagnosis not present

## 2018-08-03 DIAGNOSIS — N390 Urinary tract infection, site not specified: Secondary | ICD-10-CM | POA: Diagnosis present

## 2018-08-03 DIAGNOSIS — I25119 Atherosclerotic heart disease of native coronary artery with unspecified angina pectoris: Secondary | ICD-10-CM

## 2018-08-03 DIAGNOSIS — Z794 Long term (current) use of insulin: Secondary | ICD-10-CM | POA: Diagnosis not present

## 2018-08-03 DIAGNOSIS — E038 Other specified hypothyroidism: Secondary | ICD-10-CM | POA: Diagnosis not present

## 2018-08-03 DIAGNOSIS — S72141D Displaced intertrochanteric fracture of right femur, subsequent encounter for closed fracture with routine healing: Secondary | ICD-10-CM | POA: Diagnosis not present

## 2018-08-03 DIAGNOSIS — Z7902 Long term (current) use of antithrombotics/antiplatelets: Secondary | ICD-10-CM | POA: Diagnosis not present

## 2018-08-03 DIAGNOSIS — F0150 Vascular dementia without behavioral disturbance: Secondary | ICD-10-CM | POA: Diagnosis not present

## 2018-08-03 DIAGNOSIS — S299XXA Unspecified injury of thorax, initial encounter: Secondary | ICD-10-CM | POA: Diagnosis not present

## 2018-08-03 DIAGNOSIS — E1140 Type 2 diabetes mellitus with diabetic neuropathy, unspecified: Secondary | ICD-10-CM | POA: Diagnosis present

## 2018-08-03 DIAGNOSIS — Z6824 Body mass index (BMI) 24.0-24.9, adult: Secondary | ICD-10-CM | POA: Diagnosis not present

## 2018-08-03 DIAGNOSIS — E1100 Type 2 diabetes mellitus with hyperosmolarity without nonketotic hyperglycemic-hyperosmolar coma (NKHHC): Secondary | ICD-10-CM | POA: Diagnosis not present

## 2018-08-03 DIAGNOSIS — Z9181 History of falling: Secondary | ICD-10-CM | POA: Diagnosis not present

## 2018-08-03 DIAGNOSIS — D62 Acute posthemorrhagic anemia: Secondary | ICD-10-CM | POA: Diagnosis not present

## 2018-08-03 DIAGNOSIS — R41841 Cognitive communication deficit: Secondary | ICD-10-CM | POA: Diagnosis not present

## 2018-08-03 DIAGNOSIS — G309 Alzheimer's disease, unspecified: Secondary | ICD-10-CM | POA: Diagnosis present

## 2018-08-03 DIAGNOSIS — Z4789 Encounter for other orthopedic aftercare: Secondary | ICD-10-CM | POA: Diagnosis not present

## 2018-08-03 DIAGNOSIS — Y92129 Unspecified place in nursing home as the place of occurrence of the external cause: Secondary | ICD-10-CM | POA: Diagnosis not present

## 2018-08-03 DIAGNOSIS — W1830XA Fall on same level, unspecified, initial encounter: Secondary | ICD-10-CM | POA: Diagnosis not present

## 2018-08-03 HISTORY — PX: INTRAMEDULLARY (IM) NAIL INTERTROCHANTERIC: SHX5875

## 2018-08-03 LAB — CBC WITH DIFFERENTIAL/PLATELET
Abs Immature Granulocytes: 0.2 10*3/uL — ABNORMAL HIGH (ref 0.00–0.07)
Basophils Absolute: 0.1 10*3/uL (ref 0.0–0.1)
Basophils Relative: 0 %
EOS PCT: 1 %
Eosinophils Absolute: 0.2 10*3/uL (ref 0.0–0.5)
HEMATOCRIT: 43.5 % (ref 36.0–46.0)
HEMOGLOBIN: 12.9 g/dL (ref 12.0–15.0)
Immature Granulocytes: 1 %
LYMPHS PCT: 7 %
Lymphs Abs: 1 10*3/uL (ref 0.7–4.0)
MCH: 25.5 pg — AB (ref 26.0–34.0)
MCHC: 29.7 g/dL — AB (ref 30.0–36.0)
MCV: 86.1 fL (ref 80.0–100.0)
MONO ABS: 0.8 10*3/uL (ref 0.1–1.0)
MONOS PCT: 5 %
Neutro Abs: 12.9 10*3/uL — ABNORMAL HIGH (ref 1.7–7.7)
Neutrophils Relative %: 86 %
Platelets: 149 10*3/uL — ABNORMAL LOW (ref 150–400)
RBC: 5.05 MIL/uL (ref 3.87–5.11)
RDW: 14.8 % (ref 11.5–15.5)
WBC: 15.1 10*3/uL — AB (ref 4.0–10.5)
nRBC: 0 % (ref 0.0–0.2)

## 2018-08-03 LAB — COMPREHENSIVE METABOLIC PANEL
ALT: 19 U/L (ref 0–44)
AST: 20 U/L (ref 15–41)
Albumin: 3.6 g/dL (ref 3.5–5.0)
Alkaline Phosphatase: 50 U/L (ref 38–126)
Anion gap: 6 (ref 5–15)
BILIRUBIN TOTAL: 0.6 mg/dL (ref 0.3–1.2)
BUN: 14 mg/dL (ref 8–23)
CALCIUM: 8.7 mg/dL — AB (ref 8.9–10.3)
CO2: 28 mmol/L (ref 22–32)
CREATININE: 0.81 mg/dL (ref 0.44–1.00)
Chloride: 107 mmol/L (ref 98–111)
GFR calc Af Amer: >60 mL/min (ref >60)
Glucose, Bld: 163 mg/dL — ABNORMAL HIGH (ref 70–99)
Potassium: 3.8 mmol/L (ref 3.5–5.1)
Sodium: 141 mmol/L (ref 135–145)
TOTAL PROTEIN: 6.4 g/dL — AB (ref 6.5–8.1)

## 2018-08-03 LAB — GLUCOSE, CAPILLARY
Glucose-Capillary: 167 mg/dL — ABNORMAL HIGH (ref 70–99)
Glucose-Capillary: 156 mg/dL — ABNORMAL HIGH (ref 70–99)
Glucose-Capillary: 134 mg/dL — ABNORMAL HIGH (ref 70–99)
Glucose-Capillary: 126 mg/dL — ABNORMAL HIGH (ref 70–99)
Glucose-Capillary: 158 mg/dL — ABNORMAL HIGH (ref 70–99)
Glucose-Capillary: 166 mg/dL — ABNORMAL HIGH (ref 70–99)

## 2018-08-03 LAB — PROTIME-INR
INR: 1.12
PROTHROMBIN TIME: 14.3 s (ref 11.4–15.2)

## 2018-08-03 LAB — MRSA PCR SCREENING: MRSA by PCR: NEGATIVE

## 2018-08-03 LAB — APTT: aPTT: 31 seconds (ref 24–36)

## 2018-08-03 SURGERY — FIXATION, FRACTURE, INTERTROCHANTERIC, WITH INTRAMEDULLARY ROD
Anesthesia: General | Site: Hip | Laterality: Right

## 2018-08-03 MED ORDER — INSULIN GLARGINE 100 UNIT/ML ~~LOC~~ SOLN
10.0000 [IU] | Freq: Every day | SUBCUTANEOUS | Status: DC
  Administered 2018-08-03 – 2018-08-05 (×3): 10 [IU] via SUBCUTANEOUS
  Filled 2018-08-03 (×4): qty 0.1

## 2018-08-03 MED ORDER — MEPERIDINE HCL 50 MG/ML IJ SOLN: 6.2500 mg | INTRAMUSCULAR | Status: DC | PRN

## 2018-08-03 MED ORDER — LIDOCAINE 2% (20 MG/ML) 5 ML SYRINGE
INTRAMUSCULAR | Status: DC | PRN
  Administered 2018-08-03: 60 mg via INTRAVENOUS

## 2018-08-03 MED ORDER — PHENOL 1.4 % MT LIQD: 1.0000 | OROMUCOSAL | Status: DC | PRN

## 2018-08-03 MED ORDER — ROCURONIUM BROMIDE 50 MG/5ML IV SOSY
PREFILLED_SYRINGE | INTRAVENOUS | Status: DC | PRN
  Administered 2018-08-03: 50 mg via INTRAVENOUS

## 2018-08-03 MED ORDER — ESMOLOL HCL 100 MG/10ML IV SOLN
INTRAVENOUS | Status: DC | PRN
  Administered 2018-08-03: 20 mg via INTRAVENOUS

## 2018-08-03 MED ORDER — METOCLOPRAMIDE HCL 5 MG/ML IJ SOLN: 5.0000 mg | Freq: Three times a day (TID) | INTRAMUSCULAR | Status: DC | PRN

## 2018-08-03 MED ORDER — CEFAZOLIN SODIUM-DEXTROSE 2-4 GM/100ML-% IV SOLN
2.0000 g | INTRAVENOUS | Status: AC
  Administered 2018-08-03: 2 g via INTRAVENOUS

## 2018-08-03 MED ORDER — 0.9 % SODIUM CHLORIDE (POUR BTL) OPTIME
TOPICAL | Status: DC | PRN
  Administered 2018-08-03: 1000 mL

## 2018-08-03 MED ORDER — ONDANSETRON HCL 4 MG/2ML IJ SOLN: 4.0000 mg | Freq: Four times a day (QID) | INTRAMUSCULAR | Status: DC | PRN

## 2018-08-03 MED ORDER — INSULIN ASPART 100 UNIT/ML ~~LOC~~ SOLN
0.0000 [IU] | SUBCUTANEOUS | Status: DC
  Administered 2018-08-03 – 2018-08-04 (×6): 2 [IU] via SUBCUTANEOUS

## 2018-08-03 MED ORDER — PROPRANOLOL HCL 20 MG PO TABS
40.0000 mg | ORAL_TABLET | Freq: Two times a day (BID) | ORAL | Status: DC
  Administered 2018-08-03 – 2018-08-06 (×6): 40 mg via ORAL
  Filled 2018-08-03 (×7): qty 2

## 2018-08-03 MED ORDER — LEVOTHYROXINE SODIUM 50 MCG PO TABS
50.0000 ug | ORAL_TABLET | Freq: Every day | ORAL | Status: DC
  Administered 2018-08-03 – 2018-08-06 (×4): 50 ug via ORAL
  Filled 2018-08-03 (×4): qty 1

## 2018-08-03 MED ORDER — ONDANSETRON HCL 4 MG/2ML IJ SOLN
INTRAMUSCULAR | Status: DC | PRN
  Administered 2018-08-03: 4 mg via INTRAVENOUS

## 2018-08-03 MED ORDER — LACTATED RINGERS IV SOLN
INTRAVENOUS | Status: DC
  Administered 2018-08-03: 15:00:00 via INTRAVENOUS

## 2018-08-03 MED ORDER — ADULT MULTIVITAMIN W/MINERALS CH
1.0000 | ORAL_TABLET | Freq: Every day | ORAL | Status: DC
  Administered 2018-08-04 – 2018-08-06 (×3): 1 via ORAL
  Filled 2018-08-03 (×3): qty 1

## 2018-08-03 MED ORDER — DOCUSATE SODIUM 100 MG PO CAPS
100.0000 mg | ORAL_CAPSULE | Freq: Two times a day (BID) | ORAL | Status: DC
  Administered 2018-08-03 – 2018-08-06 (×5): 100 mg via ORAL
  Filled 2018-08-03 (×5): qty 1

## 2018-08-03 MED ORDER — MORPHINE SULFATE (PF) 2 MG/ML IV SOLN
0.5000 mg | INTRAVENOUS | Status: DC | PRN
  Administered 2018-08-03 (×2): 0.5 mg via INTRAVENOUS
  Filled 2018-08-03 (×2): qty 1

## 2018-08-03 MED ORDER — FENTANYL CITRATE (PF) 100 MCG/2ML IJ SOLN
INTRAMUSCULAR | Status: DC | PRN
  Administered 2018-08-03 (×3): 50 ug via INTRAVENOUS
  Administered 2018-08-03: 100 ug via INTRAVENOUS

## 2018-08-03 MED ORDER — HYDROCODONE-ACETAMINOPHEN 5-325 MG PO TABS
1.0000 | ORAL_TABLET | Freq: Four times a day (QID) | ORAL | Status: DC | PRN
  Administered 2018-08-03 – 2018-08-05 (×5): 1 via ORAL
  Filled 2018-08-03 (×2): qty 1
  Filled 2018-08-03: qty 2
  Filled 2018-08-03: qty 1

## 2018-08-03 MED ORDER — ENSURE ENLIVE PO LIQD
237.0000 mL | Freq: Every day | ORAL | Status: DC
  Administered 2018-08-04 – 2018-08-06 (×3): 237 mL via ORAL

## 2018-08-03 MED ORDER — PROPOFOL 10 MG/ML IV BOLUS
INTRAVENOUS | Status: AC
  Filled 2018-08-03: qty 20

## 2018-08-03 MED ORDER — CEFAZOLIN SODIUM-DEXTROSE 1-4 GM/50ML-% IV SOLN
1.0000 g | Freq: Three times a day (TID) | INTRAVENOUS | Status: AC
  Administered 2018-08-03 – 2018-08-04 (×2): 1 g via INTRAVENOUS
  Filled 2018-08-03 (×2): qty 50

## 2018-08-03 MED ORDER — ASPIRIN EC 325 MG PO TBEC
325.0000 mg | DELAYED_RELEASE_TABLET | Freq: Every day | ORAL | Status: DC
  Administered 2018-08-04 – 2018-08-06 (×3): 325 mg via ORAL
  Filled 2018-08-03 (×3): qty 1

## 2018-08-03 MED ORDER — SODIUM CHLORIDE 0.9 % IV SOLN
INTRAVENOUS | Status: DC
  Administered 2018-08-03 – 2018-08-04 (×4): via INTRAVENOUS

## 2018-08-03 MED ORDER — ONDANSETRON HCL 4 MG/2ML IJ SOLN
4.0000 mg | Freq: Once | INTRAMUSCULAR | Status: AC
  Administered 2018-08-03: 4 mg via INTRAVENOUS
  Filled 2018-08-03: qty 2

## 2018-08-03 MED ORDER — MORPHINE SULFATE (PF) 2 MG/ML IV SOLN
1.0000 mg | INTRAVENOUS | Status: DC | PRN
  Administered 2018-08-03 – 2018-08-05 (×2): 1 mg via INTRAVENOUS
  Filled 2018-08-03 (×2): qty 1

## 2018-08-03 MED ORDER — FENTANYL CITRATE (PF) 100 MCG/2ML IJ SOLN
50.0000 ug | Freq: Once | INTRAMUSCULAR | Status: AC
  Administered 2018-08-03: 50 ug via INTRAVENOUS
  Filled 2018-08-03: qty 2

## 2018-08-03 MED ORDER — PANTOPRAZOLE SODIUM 40 MG PO TBEC
40.0000 mg | DELAYED_RELEASE_TABLET | Freq: Every day | ORAL | Status: DC
  Administered 2018-08-03 – 2018-08-06 (×4): 40 mg via ORAL
  Filled 2018-08-03 (×4): qty 1

## 2018-08-03 MED ORDER — METOCLOPRAMIDE HCL 5 MG PO TABS: 5.0000 mg | ORAL_TABLET | Freq: Three times a day (TID) | ORAL | Status: DC | PRN

## 2018-08-03 MED ORDER — METHOCARBAMOL 500 MG PO TABS
500.0000 mg | ORAL_TABLET | Freq: Two times a day (BID) | ORAL | Status: DC | PRN
  Administered 2018-08-03: 500 mg via ORAL
  Filled 2018-08-03: qty 1

## 2018-08-03 MED ORDER — FENTANYL CITRATE (PF) 100 MCG/2ML IJ SOLN: 25.0000 ug | INTRAMUSCULAR | Status: DC | PRN

## 2018-08-03 MED ORDER — MIRTAZAPINE 15 MG PO TABS
15.0000 mg | ORAL_TABLET | Freq: Every day | ORAL | Status: DC
  Administered 2018-08-03 – 2018-08-05 (×3): 15 mg via ORAL
  Filled 2018-08-03 (×3): qty 1

## 2018-08-03 MED ORDER — BUPIVACAINE-EPINEPHRINE (PF) 0.25% -1:200000 IJ SOLN
INTRAMUSCULAR | Status: AC
  Filled 2018-08-03: qty 30

## 2018-08-03 MED ORDER — SUGAMMADEX SODIUM 200 MG/2ML IV SOLN
INTRAVENOUS | Status: DC | PRN
  Administered 2018-08-03: 150 mg via INTRAVENOUS

## 2018-08-03 MED ORDER — PHENYLEPHRINE HCL 10 MG/ML IJ SOLN
INTRAMUSCULAR | Status: DC | PRN
  Administered 2018-08-03: 80 ug via INTRAVENOUS
  Administered 2018-08-03: 120 ug via INTRAVENOUS
  Administered 2018-08-03 (×2): 80 ug via INTRAVENOUS

## 2018-08-03 MED ORDER — ONDANSETRON HCL 4 MG PO TABS: 4.0000 mg | ORAL_TABLET | Freq: Four times a day (QID) | ORAL | Status: DC | PRN

## 2018-08-03 MED ORDER — CEFAZOLIN SODIUM-DEXTROSE 2-4 GM/100ML-% IV SOLN
INTRAVENOUS | Status: AC
  Filled 2018-08-03: qty 100

## 2018-08-03 MED ORDER — MENTHOL 3 MG MT LOZG: 1.0000 | LOZENGE | OROMUCOSAL | Status: DC | PRN

## 2018-08-03 MED ORDER — LISINOPRIL 40 MG PO TABS
40.0000 mg | ORAL_TABLET | Freq: Every day | ORAL | Status: DC
  Administered 2018-08-03 – 2018-08-06 (×4): 40 mg via ORAL
  Filled 2018-08-03 (×4): qty 1

## 2018-08-03 MED ORDER — PROPOFOL 10 MG/ML IV BOLUS
INTRAVENOUS | Status: DC | PRN
  Administered 2018-08-03: 100 mg via INTRAVENOUS

## 2018-08-03 MED ORDER — CHLORHEXIDINE GLUCONATE 4 % EX LIQD: 60.0000 mL | Freq: Once | CUTANEOUS | Status: DC

## 2018-08-03 MED ORDER — ALBUMIN HUMAN 5 % IV SOLN
INTRAVENOUS | Status: DC | PRN
  Administered 2018-08-03: 17:00:00 via INTRAVENOUS

## 2018-08-03 MED ORDER — PROMETHAZINE HCL 25 MG/ML IJ SOLN
12.5000 mg | Freq: Once | INTRAMUSCULAR | Status: AC
  Administered 2018-08-03: 12.5 mg via INTRAVENOUS
  Filled 2018-08-03: qty 1

## 2018-08-03 MED ORDER — POVIDONE-IODINE 10 % EX SWAB: 2.0000 "application " | Freq: Once | CUTANEOUS | Status: DC

## 2018-08-03 MED ORDER — BUPIVACAINE-EPINEPHRINE 0.5% -1:200000 IJ SOLN
INTRAMUSCULAR | Status: DC | PRN
  Administered 2018-08-03: 10 mL

## 2018-08-03 MED ORDER — FENTANYL CITRATE (PF) 250 MCG/5ML IJ SOLN
INTRAMUSCULAR | Status: AC
  Filled 2018-08-03: qty 5

## 2018-08-03 SURGICAL SUPPLY — 41 items
BIT DRILL CANN LG 4.3MM (BIT) IMPLANT
BNDG COHESIVE 4X5 TAN STRL (GAUZE/BANDAGES/DRESSINGS) ×3 IMPLANT
CANISTER SUCT 3000ML PPV (MISCELLANEOUS) ×3 IMPLANT
COVER MAYO STAND STRL (DRAPES) ×3 IMPLANT
COVER PERINEAL POST (MISCELLANEOUS) ×3 IMPLANT
COVER SURGICAL LIGHT HANDLE (MISCELLANEOUS) ×3 IMPLANT
COVER WAND RF STERILE (DRAPES) ×3 IMPLANT
DRAPE C-ARM 42X72 X-RAY (DRAPES) ×3 IMPLANT
DRAPE STERI IOBAN 125X83 (DRAPES) ×3 IMPLANT
DRILL BIT CANN LG 4.3MM (BIT) ×3
DRSG PAD ABDOMINAL 8X10 ST (GAUZE/BANDAGES/DRESSINGS) ×3 IMPLANT
DURAPREP 26ML APPLICATOR (WOUND CARE) ×3 IMPLANT
ELECT REM PT RETURN 9FT ADLT (ELECTROSURGICAL) ×3
ELECTRODE REM PT RTRN 9FT ADLT (ELECTROSURGICAL) ×1 IMPLANT
EVACUATOR 1/8 PVC DRAIN (DRAIN) IMPLANT
GAUZE SPONGE 4X4 12PLY STRL (GAUZE/BANDAGES/DRESSINGS) ×3 IMPLANT
GAUZE XEROFORM 1X8 LF (GAUZE/BANDAGES/DRESSINGS) ×3 IMPLANT
GLOVE BIOGEL PI IND STRL 8 (GLOVE) ×2 IMPLANT
GLOVE BIOGEL PI INDICATOR 8 (GLOVE) ×4
GLOVE ORTHO TXT STRL SZ7.5 (GLOVE) ×6 IMPLANT
GOWN STRL REUS W/ TWL LRG LVL3 (GOWN DISPOSABLE) ×1 IMPLANT
GOWN STRL REUS W/TWL 2XL LVL3 (GOWN DISPOSABLE) ×3 IMPLANT
GOWN STRL REUS W/TWL LRG LVL3 (GOWN DISPOSABLE) ×5 IMPLANT
GUIDEPIN 3.2X17.5 THRD DISP (PIN) ×2 IMPLANT
HIP FRAC NAIL LAG SCR 10.5X100 (Orthopedic Implant) ×2 IMPLANT
KIT BASIN OR (CUSTOM PROCEDURE TRAY) ×3 IMPLANT
KIT TURNOVER KIT B (KITS) ×3 IMPLANT
LINER BOOT UNIVERSAL DISP (MISCELLANEOUS) ×3 IMPLANT
MANIFOLD NEPTUNE II (INSTRUMENTS) ×3 IMPLANT
NAIL HIP FRACT 130D 11X180 (Screw) ×2 IMPLANT
NS IRRIG 1000ML POUR BTL (IV SOLUTION) ×3 IMPLANT
PACK GENERAL/GYN (CUSTOM PROCEDURE TRAY) ×3 IMPLANT
PAD ARMBOARD 7.5X6 YLW CONV (MISCELLANEOUS) ×6 IMPLANT
SCREW BONE CORTICAL 5.0X3 (Screw) ×2 IMPLANT
SCREW CANN THRD AFF 10.5X100 (Orthopedic Implant) IMPLANT
STAPLER VISISTAT 35W (STAPLE) ×3 IMPLANT
SUT VIC AB 0 CT1 27 (SUTURE) ×2
SUT VIC AB 0 CT1 27XBRD ANBCTR (SUTURE) ×1 IMPLANT
SUT VIC AB 2-0 CT1 27 (SUTURE) ×4
SUT VIC AB 2-0 CT1 TAPERPNT 27 (SUTURE) ×2 IMPLANT
WATER STERILE IRR 1000ML POUR (IV SOLUTION) ×3 IMPLANT

## 2018-08-03 NOTE — Anesthesia Procedure Notes (Signed)
Procedure Name: Intubation Date/Time: 08/03/2018 4:15 PM Performed by: Inda Coke, CRNA Pre-anesthesia Checklist: Patient identified, Emergency Drugs available, Suction available and Patient being monitored Patient Re-evaluated:Patient Re-evaluated prior to induction Oxygen Delivery Method: Circle System Utilized Preoxygenation: Pre-oxygenation with 100% oxygen Induction Type: IV induction Ventilation: Mask ventilation without difficulty Laryngoscope Size: Mac and 3 Grade View: Grade I Tube type: Oral Tube size: 7.0 mm Number of attempts: 1 Airway Equipment and Method: Stylet and Oral airway Placement Confirmation: ETT inserted through vocal cords under direct vision,  positive ETCO2 and breath sounds checked- equal and bilateral Secured at: 21 cm Tube secured with: Tape Dental Injury: Teeth and Oropharynx as per pre-operative assessment

## 2018-08-03 NOTE — H&P (Signed)
History and Physical    Braelee Herrle XLK:[id number] DOB: 12-02-1940 DOA: 08/02/2018  PCP: [name], MD  Patient coming from: SNF  I have personally briefly reviewed patient's old medical records in [place]  Chief Complaint: Fall, hip pain  HPI: Kristin Grant is a 77 y.o. female with medical history significant of Alzheimer's disease, CAD, stroke, HTN, DM2.  Patient got up to go use bathroom at SNF.  Had mechanical fall.  Severe pain to R hip area following fall, no pain else where.  Pain worse with movement, unable to bear weight.  Associated deformity.  No headache, no neck pain, doesn't think she hit her head.   ED Course: R hip intertrochanteric fx.   Review of Systems: As per HPI otherwise 10 point review of systems negative.   Past Medical History:  Diagnosis Date  . Alzheimer disease ([place])   . Arthritis   . Coronary artery disease   . Diabetes mellitus   . Difficulty walking   . Hypertension   . Reflux   . Stroke [facility])     Past Surgical History:  Procedure Laterality Date  . APPENDECTOMY    . FINGER DEBRIDEMENT       reports that she has never smoked. She has never used smokeless tobacco. She reports that she does not drink alcohol or use drugs.  No Known Allergies  Family History  Problem Relation Age of Onset  . Hypertension Mother   . Heart disease Mother   . Hypertension Father   . Heart disease Father   . Hypertension Sister   . Heart disease Sister   . Cancer Sister        BREAST   . Hypertension Brother   . Heart disease Brother      Prior to Admission medications   Medication Sig Start Date End Date Taking? Authorizing Provider  aspirin 81 MG tablet TAKE 1 TABLET BY MOUTH ONCE DAILY. 12/08/17   [place], Modena Nunnery, MD  Blood Glucose Monitoring Suppl (BLOOD GLUCOSE MONITOR SYSTEM) W/DEVICE KIT 1 Units by Does not apply route daily. 07/19/13   [name], MD  clopidogrel (PLAVIX) 75 MG tablet TAKE 1 TABLET (40m) BY MOUTH  ONCE DAILY. 12/08/17   DAlycia Rossetti MD  guaiFENesin-dextromethorphan ([facility]) 100-10 MG/5ML syrup Give 567mpo TID for 1 week, then as needed 06/04/18   PiSusy FrizzleMD  Insulin Glargine (LANTUS SOLOSTAR) 100 UNIT/ML Solostar Pen INJECT 15 U SQ Q AM, AND 13 U SQ QHS 03/05/18   [place], KaModena NunneryMD  insulin lispro (HUMALOG) 100 UNIT/ML cartridge Inject 5u SQ with Noon meal and inject 3u SQ with 5pm meal. 07/30/18   [place], KaModena NunneryMD  Insulin Pen Needle (NOVOFINE AUTOCOVER) 30G X 8 MM MISC USE WITH INSULIN FOR ADMINISTRATION. 10/20/17   DuAlycia RossettiMD  levothyroxine (SYNTHROID, LEVOTHROID) 50 MCG tablet TAKE 1 TABLET BY MOUTH ONCE DAILY. 12/08/17   [place], KaModena NunneryMD  lisinopril (PRINIVIL,ZESTRIL) 40 MG tablet TAKE 1 TABLET BY MOUTH ONCE DAILY. Patient taking differently: TAKE 1 TABLET (4020mBY MOUTH ONCE DAILY. 08/09/13   [place], KawModena NunneryD  methocarbamol (ROBAXIN) 500 MG tablet Take 1 tablet (500 mg total) by mouth 2 (two) times daily as needed for muscle spasms. 05/20/18   McMFrancine GravenO  mirtazapine (REMERON) 15 MG tablet TAKE 1 TABLET BY MOUTH AT BEDTIME. 12/08/17   [place], KawModena NunneryD  Multiple Vitamin (DAILY VITE) TABS TAKE 1 TABLET  BY MOUTH ONCE DAILY. 12/08/17   [name], MD  Nutritional Supplements (NUTRITIONAL SUPPLEMENT PO) Take 1 Container by mouth 3 (three) times daily.     [provider]  pantoprazole (PROTONIX) 40 MG tablet TAKE 1 TABLET BY MOUTH ONCE DAILY. 12/08/17   [name], MD  propranolol (INDERAL) 40 MG tablet TAKE 1 TABLET BY MOUTH TWICE DAILY. Patient taking differently: TAKE 1 TABLET (47m) BY MOUTH TWICE DAILY. 08/09/13   [place], KModena Nunnery MD  TRADJENTA 5 MG TABS tablet TAKE 1 TABLET BY MOUTH ONCE DAILY. 12/08/17   DAlycia Rossetti MD  traMADol (ULTRAM) 50 MG tablet TAKE 1 TABLET BY MOUTH TWICE DAILY. 05/05/18   DOrlena Sheldon PA-C    Physical Exam: Vitals:   08/03/18 0003 08/03/18 0200 08/03/18 0230  08/03/18 0300  BP: (!) 144/68 (!) 132/55 (!) 141/51 138/85  Pulse: 66 63 64 65  Resp: 16 (!) 23 (!) 24 (!) 21  Temp: 98 F (36.7 C)     TempSrc: Oral     SpO2: 96% 91% 93% 92%  Weight: 62.6 kg     Height: _0  (1.6 m)       Constitutional: NAD, calm, comfortable Eyes: PERRL, lids and conjunctivae normal ENMT: Mucous membranes are moist. Posterior pharynx clear of any exudate or lesions.Normal dentition.  Neck: normal, supple, no masses, no thyromegaly Respiratory: clear to auscultation bilaterally, no wheezing, no crackles. Normal respiratory effort. No accessory muscle use.  Cardiovascular: Regular rate and rhythm, no murmurs / rubs / gallops. No extremity edema. 2+ pedal pulses. No carotid bruits.  Abdomen: no tenderness, no masses palpated. No hepatosplenomegaly. Bowel sounds positive.  Musculoskeletal: no clubbing / cyanosis. No joint deformity upper and lower extremities. Good ROM, no contractures. Normal muscle tone.  Skin: no rashes, lesions, ulcers. No induration Neurologic: CN 2-12 grossly intact. Sensation intact, DTR normal. Strength 5/5 in all 4.  Psychiatric: Despite h/o dementia patient is alert and oriented, seems to be quite with it, able to provide HPI for tonight's events, and cary on a normal conversation (dementia seems mild to me).   Labs on Admission: I have personally reviewed following labs and imaging studies  CBC: Recent Labs  Lab 08/03/18 0146  WBC 15.1*  NEUTROABS 12.9*  HGB 12.9  HCT 43.5  MCV 86.1  PLT 1350   Basic Metabolic Panel: Recent Labs  Lab 08/03/18 0146  NA 141  K 3.8  CL 107  CO2 28  GLUCOSE 163*  BUN 14  CREATININE 0.81  CALCIUM 8.7*   GFR: Estimated Creatinine Clearance: 46.6 mL/min (by C-G formula based on SCr of 0.81 mg/dL). Liver Function Tests: Recent Labs  Lab 08/03/18 0146  AST 20  ALT 19  ALKPHOS 50  BILITOT 0.6  PROT 6.4*  ALBUMIN 3.6   No results for input(s): LIPASE, AMYLASE in the last 168 hours. No  results for input(s): AMMONIA in the last 168 hours. Coagulation Profile: Recent Labs  Lab 08/03/18 0146  INR 1.12   Cardiac Enzymes: No results for input(s): CKTOTAL, CKMB, CKMBINDEX, TROPONINI in the last 168 hours. BNP (last 3 results) No results for input(s): PROBNP in the last 8760 hours. HbA1C: No results for input(s): HGBA1C in the last 72 hours. CBG: No results for input(s): GLUCAP in the last 168 hours. Lipid Profile: No results for input(s): CHOL, HDL, LDLCALC, TRIG, CHOLHDL, LDLDIRECT in the last 72 hours. Thyroid Function Tests: No results for input(s): TSH, T4TOTAL, FREET4, T3FREE, THYROIDAB in the  last 72 hours. Anemia Panel: No results for input(s): VITAMINB12, FOLATE, FERRITIN, TIBC, IRON, RETICCTPCT in the last 72 hours. Urine analysis:    Component Value Date/Time   COLORURINE YELLOW 11/14/2017 2237   APPEARANCEUR CLEAR 11/14/2017 2237   LABSPEC 1.018 11/14/2017 2237   PHURINE 6.0 11/14/2017 2237   GLUCOSEU 50 (A) 11/14/2017 2237   HGBUR NEGATIVE 11/14/2017 2237   BILIRUBINUR NEGATIVE 11/14/2017 2237   KETONESUR NEGATIVE 11/14/2017 2237   PROTEINUR NEGATIVE 11/14/2017 2237   UROBILINOGEN 0.2 10/11/2013 0634   NITRITE NEGATIVE 11/14/2017 2237   LEUKOCYTESUR MODERATE (A) 11/14/2017 2237    Radiological Exams on Admission: Dg Chest 1 View  Result Date: 08/03/2018 CLINICAL DATA:  59 76-year-old female with fall and right hip pain. EXAM: CHEST  1 VIEW COMPARISON:  Chest radiograph dated 01/17/2017 FINDINGS: Mild chronic interstitial coarsening. No focal consolidation, pleural effusion, or pneumothorax. Minimal left lung base atelectasis/scarring. The cardiac silhouette is within normal limits. Coronary vascular calcifications. No acute osseous pathology. IMPRESSION: No active disease. Electronically Signed   By: Anner Crete M.D.   On: 08/03/2018 01:26   Dg Hip Unilat W Or Wo Pelvis 2-3 Views Right  Result Date: 08/03/2018 CLINICAL DATA:  77 year old  female with fall and right hip pain. EXAM: DG HIP (WITH OR WITHOUT PELVIS) 2-3V RIGHT COMPARISON:  None. FINDINGS: There is mildly displaced intertrochanteric fracture of the right femoral neck with mild varus angulation. There is advanced osteopenia. No dislocation. Vascular calcifications noted. The soft tissues are unremarkable. IMPRESSION: Mildly displaced intertrochanteric fracture of the right femoral neck. Electronically Signed   By: Anner Crete M.D.   On: 08/03/2018 01:25    EKG: Independently reviewed.  Assessment/Plan Principal Problem:   Closed intertrochanteric fracture of hip, right, initial encounter ([place]) Active Problems:   Dementia ([place])   Hypertension   Hypothyroidism   Diabetes mellitus, type II ([place])   Coronary artery disease    1. R hip fx - 1. Dr. Lorin Mercy says to admit pt to [facility], he plans to operate today if there is OR availability. 2. Hip fx pathway 3. NPO 4. IVF: NS at 73 5. Pain ctrl per pathway 2. DM2 - 1. While NPO will do Lantus 10u QHS 2. And sensitive SSI Q4H 3. HTN - continue home BP meds 4. Hypothyroidism - continue synthroid 5. CAD, h/o stroke - holding ASA / Plavix for the moment 6. Dementia - 1. Continue home meds  DVT prophylaxis: SCDs - surgery today hopefully Code Status: Full Family Communication: Updated pt son Tyia Binford about transfer to [facility], broke hip, need for surgery Disposition Plan: SNF after admit Consults called: Dr. Lorin Mercy Admission status: Admit to inpatient  Severity of Illness: The appropriate patient status for this patient is INPATIENT. Inpatient status is judged to be reasonable and necessary in order to provide the required intensity of service to ensure the patient's safety. The patient's presenting symptoms, physical exam findings, and initial radiographic and laboratory data in the context of their chronic comorbidities is felt to place them at high risk for further clinical deterioration. Furthermore, it is not  anticipated that the patient will be medically stable for discharge from the hospital within 2 midnights of admission. The following factors support the patient status of inpatient.   " The patient's presenting symptoms include Fall, hip pain. " The worrisome physical exam findings include R hip deformity. " The initial radiographic and laboratory data are worrisome because of R hip intertrochanteric fx. " The chronic co-morbidities include  Alzheimers dz, DM, CAD, stroke.   * I certify that at the point of admission it is my clinical judgment that the patient will require inpatient hospital care spanning beyond 2 midnights from the point of admission due to high intensity of service, high risk for further deterioration and high frequency of surveillance required.Etta Quill DO [facility] Pager [phone] Only works nights!  If 7AM-7PM, please contact the primary day team physician taking care of patient  [email or web address] Password [email or web address]  08/03/2018, 3:21 AM

## 2018-08-03 NOTE — Transfer of Care (Signed)
Immediate Anesthesia Transfer of Care Note  Patient: Kristin Grant  Procedure(s) Performed: INTRAMEDULLARY (IM) NAIL INTERTROCHANTRIC (Right Hip)  Patient Location: PACU  Anesthesia Type:General  Level of Consciousness: awake and drowsy  Airway & Oxygen Therapy: Patient Spontanous Breathing and Patient connected to nasal cannula oxygen  Post-op Assessment: Report given to RN, Post -op Vital signs reviewed and stable and Patient moving all extremities X 4  Post vital signs: Reviewed and stable  Last Vitals:  Vitals Value Taken Time  BP    Temp    Pulse 110 08/03/2018  5:33 PM  Resp 23 08/03/2018  5:33 PM  SpO2 98 % 08/03/2018  5:33 PM  Vitals shown include unvalidated device data.  Last Pain:  Vitals:   08/03/18 1234  TempSrc: Oral  PainSc:          Complications: No apparent anesthesia complications

## 2018-08-03 NOTE — Plan of Care (Signed)

## 2018-08-03 NOTE — ED Provider Notes (Signed)
[facility] EMERGENCY DEPARTMENT Provider Note   CSN: [id number] Arrival date & time: 08/02/18  2349  Time seen 12:38 AM   History   Chief Complaint Chief Complaint  Patient presents with  . Fall   Level 5 caveat for dementia.  HPI Kristin Grant is a 77 y.o. female.  HPI patient states she got up in her nursing facility from her bedroom to go into the bathroom and she does not know what happened but she fell.  She states she only hurts in her right hip area.  She denies having any headache and does not think she hit her head.  She denies any neck pain.  PCP [name], MD   Past Medical History:  Diagnosis Date  . Alzheimer disease ([place])   . Arthritis   . Coronary artery disease   . Diabetes mellitus   . Difficulty walking   . Hypertension   . Reflux   . Stroke [facility])     Patient Active Problem List   Diagnosis Date Noted  . Coronary artery disease 11/17/2017  . Thrombocytopenia ([place]) 11/17/2017  . Right arm fracture 11/17/2017  . Cerebral atrophy ([place]) 11/17/2017  . Hypokalemia 11/17/2017  . History of CVA (cerebrovascular accident) 11/15/2017  . Suspected stroke patient last known to be well 3 to 4.5 hours ago   . Closed fracture of proximal end of right humerus 09/18/17 10/16/2017  . Osteoporosis 01/09/2017  . Loss of weight 01/12/2016  . Stroke-like episode ([place]) s/p IV tPA 11/22/2015  . Diabetes mellitus, type II ([place]) 06/22/2014  . Protein-calorie malnutrition ([place]) 12/15/2013  . Fall 08/17/2013  . Back pain 08/17/2013  . Pressure ulcer, stage 1 07/06/2013  . Abnormal development of nail 03/28/2013  . Gait abnormality 12/10/2011  . Dementia ([place]) 11/21/2011  . Hypertension 11/21/2011  . Hypothyroidism 11/21/2011    Past Surgical History:  Procedure Laterality Date  . APPENDECTOMY    . FINGER DEBRIDEMENT       OB History   None      Home Medications    Prior to Admission medications   Medication Sig Start Date End Date Taking?  Authorizing Provider  aspirin 81 MG tablet TAKE 1 TABLET BY MOUTH ONCE DAILY. 12/08/17   [place], Modena Nunnery, MD  Blood Glucose Monitoring Suppl (BLOOD GLUCOSE MONITOR SYSTEM) W/DEVICE KIT 1 Units by Does not apply route daily. 07/19/13   [name], MD  clopidogrel (PLAVIX) 75 MG tablet TAKE 1 TABLET (38m) BY MOUTH ONCE DAILY. 12/08/17   DAlycia Rossetti MD  guaiFENesin-dextromethorphan ([facility]) 100-10 MG/5ML syrup Give 535mpo TID for 1 week, then as needed 06/04/18   PiSusy FrizzleMD  Insulin Glargine (LANTUS SOLOSTAR) 100 UNIT/ML Solostar Pen INJECT 15 U SQ Q AM, AND 13 U SQ QHS 03/05/18   [place], KaModena NunneryMD  insulin lispro (HUMALOG) 100 UNIT/ML cartridge Inject 5u SQ with Noon meal and inject 3u SQ with 5pm meal. 07/30/18   [place], KaModena NunneryMD  Insulin Pen Needle (NOVOFINE AUTOCOVER) 30G X 8 MM MISC USE WITH INSULIN FOR ADMINISTRATION. 10/20/17   DuAlycia RossettiMD  levothyroxine (SYNTHROID, LEVOTHROID) 50 MCG tablet TAKE 1 TABLET BY MOUTH ONCE DAILY. 12/08/17   [place], KaModena NunneryMD  lisinopril (PRINIVIL,ZESTRIL) 40 MG tablet TAKE 1 TABLET BY MOUTH ONCE DAILY. Patient taking differently: TAKE 1 TABLET (4071mBY MOUTH ONCE DAILY. 08/09/13   DurAlycia RossettiD  methocarbamol (ROBAXIN) 500 MG tablet Take 1 tablet (  500 mg total) by mouth 2 (two) times daily as needed for muscle spasms. 05/20/18   Francine Graven, DO  mirtazapine (REMERON) 15 MG tablet TAKE 1 TABLET BY MOUTH AT BEDTIME. 12/08/17   [place], Modena Nunnery, MD  Multiple Vitamin (DAILY VITE) TABS TAKE 1 TABLET BY MOUTH ONCE DAILY. 12/08/17   [name], MD  Nutritional Supplements (NUTRITIONAL SUPPLEMENT PO) Take 1 Container by mouth 3 (three) times daily.     [provider]  pantoprazole (PROTONIX) 40 MG tablet TAKE 1 TABLET BY MOUTH ONCE DAILY. 12/08/17   [name], MD  propranolol (INDERAL) 40 MG tablet TAKE 1 TABLET BY MOUTH TWICE DAILY. Patient taking differently: TAKE 1 TABLET (45m) BY  MOUTH TWICE DAILY. 08/09/13   [place], KModena Nunnery MD  TRADJENTA 5 MG TABS tablet TAKE 1 TABLET BY MOUTH ONCE DAILY. 12/08/17   DAlycia Rossetti MD  traMADol (ULTRAM) 50 MG tablet TAKE 1 TABLET BY MOUTH TWICE DAILY. 05/05/18   DOrlena Sheldon PA-C    Family History Family History  Problem Relation Age of Onset  . Hypertension Mother   . Heart disease Mother   . Hypertension Father   . Heart disease Father   . Hypertension Sister   . Heart disease Sister   . Cancer Sister        BREAST   . Hypertension Brother   . Heart disease Brother     Social History Social History   Tobacco Use  . Smoking status: Never Smoker  . Smokeless tobacco: Never Used  Substance Use Topics  . Alcohol use: No  . Drug use: No  lives in a NH   Allergies   Patient has no known allergies.   Review of Systems Review of Systems  Unable to perform ROS: Dementia     Physical Exam Updated Vital Signs BP (!) 144/68 (BP Location: Left Arm)   Pulse 66   Temp 98 F (36.7 C) (Oral)   Resp 16   Ht 5' 3" (1.6 m)   Wt 62.6 kg   SpO2 96%   BMI 24.45 kg/m   Vital signs normal    Physical Exam  Constitutional: She is oriented to person, place, and time.  Non-toxic appearance. She does not appear ill. No distress.  Frail elderly female  HENT:  Head: Normocephalic and atraumatic.  Right Ear: External ear normal.  Left Ear: External ear normal.  Nose: Nose normal. No mucosal edema or rhinorrhea.  Mouth/Throat: Oropharynx is clear and moist and mucous membranes are normal. No dental abscesses or uvula swelling.  Nontender to palpation  Eyes: Pupils are equal, round, and reactive to light. Conjunctivae and EOM are normal.  Neck: Normal range of motion and full passive range of motion without pain. Neck supple.  No tenderness and she moves her head freely during conversation  Cardiovascular: Normal rate, regular rhythm and normal heart sounds. Exam reveals no gallop and no friction rub.  No murmur  heard. Pulmonary/Chest: Effort normal and breath sounds normal. No respiratory distress. She has no wheezes. She has no rhonchi. She has no rales. She exhibits no tenderness and no crepitus.  Abdominal: Soft. Normal appearance and bowel sounds are normal. She exhibits no distension. There is no tenderness. There is no rebound and no guarding.  Musculoskeletal: Normal range of motion. She exhibits no edema or tenderness.       Legs: Moves all extremities well except her right lower extremity.  Patient has shortening of her  right lower leg and minimal external rotation.  She has pain in the right hip area.  Neurological: She is alert and oriented to person, place, and time. She has normal strength. No cranial nerve deficit.  Skin: Skin is warm, dry and intact. No rash noted. No erythema. No pallor.  Psychiatric: She has a normal mood and affect. Her speech is normal and behavior is normal. Her mood appears not anxious.  Nursing note and vitals reviewed.    ED Treatments / Results  Labs (all labs ordered are listed, but only abnormal results are displayed) Results for orders placed or performed during the hospital encounter of 08/02/18  Comprehensive metabolic panel  Result Value Ref Range   Sodium 141 135 - 145 mmol/L   Potassium 3.8 3.5 - 5.1 mmol/L   Chloride 107 98 - 111 mmol/L   CO2 28 22 - 32 mmol/L   Glucose, Bld 163 (H) 70 - 99 mg/dL   BUN 14 8 - 23 mg/dL   Creatinine, Ser 0.81 0.44 - 1.00 mg/dL   Calcium 8.7 (L) 8.9 - 10.3 mg/dL   Total Protein 6.4 (L) 6.5 - 8.1 g/dL   Albumin 3.6 3.5 - 5.0 g/dL   AST 20 15 - 41 U/L   ALT 19 0 - 44 U/L   Alkaline Phosphatase 50 38 - 126 U/L   Total Bilirubin 0.6 0.3 - 1.2 mg/dL   GFR calc non Af Amer >60 >60 mL/min   GFR calc Af Amer >60 >60 mL/min   Anion gap 6 5 - 15  CBC with Differential  Result Value Ref Range   WBC 15.1 (H) 4.0 - 10.5 K/uL   RBC 5.05 3.87 - 5.11 MIL/uL   Hemoglobin 12.9 12.0 - 15.0 g/dL   HCT 43.5 36.0 - 46.0 %    MCV 86.1 80.0 - 100.0 fL   MCH 25.5 (L) 26.0 - 34.0 pg   MCHC 29.7 (L) 30.0 - 36.0 g/dL   RDW 14.8 11.5 - 15.5 %   Platelets 149 (L) 150 - 400 K/uL   nRBC 0.0 0.0 - 0.2 %   Neutrophils Relative % 86 %   Neutro Abs 12.9 (H) 1.7 - 7.7 K/uL   Lymphocytes Relative 7 %   Lymphs Abs 1.0 0.7 - 4.0 K/uL   Monocytes Relative 5 %   Monocytes Absolute 0.8 0.1 - 1.0 K/uL   Eosinophils Relative 1 %   Eosinophils Absolute 0.2 0.0 - 0.5 K/uL   Basophils Relative 0 %   Basophils Absolute 0.1 0.0 - 0.1 K/uL   Immature Granulocytes 1 %   Abs Immature Granulocytes 0.20 (H) 0.00 - 0.07 K/uL  Protime-INR  Result Value Ref Range   Prothrombin Time 14.3 11.4 - 15.2 seconds   INR 1.12   APTT  Result Value Ref Range   aPTT 31 24 - 36 seconds   Laboratory interpretation all normal except hyperglycemia, leukocytosis   EKG EKG Interpretation  Date/Time:  Monday August 03 2018 00:15:58 EST Ventricular Rate:  68 PR Interval:    QRS Duration: 139 QT Interval:  470 QTC Calculation: 500 R Axis:   44 Text Interpretation:  Sinus rhythm Left bundle branch block No significant change since last tracing 17 Sep 2016 Confirmed by Rolland Porter [id number]) on 08/03/2018 12:18:57 AM   Radiology Dg Chest 1 View  Result Date: 08/03/2018 CLINICAL DATA:  [age]-year-old female with fall and right hip pain. EXAM: CHEST  1 VIEW COMPARISON:  Chest radiograph dated 01/17/2017 FINDINGS:  Mild chronic interstitial coarsening. No focal consolidation, pleural effusion, or pneumothorax. Minimal left lung base atelectasis/scarring. The cardiac silhouette is within normal limits. Coronary vascular calcifications. No acute osseous pathology. IMPRESSION: No active disease. Electronically Signed   By: Anner Crete M.D.   On: 08/03/2018 01:26   Dg Hip Unilat W Or Wo Pelvis 2-3 Views Right  Result Date: 08/03/2018 CLINICAL DATA:  77 year old female with fall and right hip pain. EXAM: DG HIP (WITH OR WITHOUT PELVIS) 2-3V RIGHT  COMPARISON:  None. FINDINGS: There is mildly displaced intertrochanteric fracture of the right femoral neck with mild varus angulation. There is advanced osteopenia. No dislocation. Vascular calcifications noted. The soft tissues are unremarkable. IMPRESSION: Mildly displaced intertrochanteric fracture of the right femoral neck. Electronically Signed   By: Anner Crete M.D.   On: 08/03/2018 01:25    Procedures Procedures (including critical care time)  Medications Ordered in ED Medications  fentaNYL (SUBLIMAZE) injection 50 mcg (50 mcg Intravenous Given 08/03/18 0147)     Initial Impression / Assessment and Plan / ED Course  I have reviewed the triage vital signs and the nursing notes.  Pertinent labs & imaging results that were available during my care of the patient were reviewed by me and considered in my medical decision making (see chart for details).     Patient was given IV pain medication for suspected right hip fracture.  X-rays were obtained laboratory tests were ordered.  1:26 AM when I look at her x-ray patient does have a intertrochanteric fracture.  I will talk to Dr. Aline Brochure about admission.  The call list is wrong and Dr. Lorin Mercy is actually on call tonight for orthopedics.  2:55 AM Dr. Lorin Mercy was contacted.  He wants the patient admitted at [facility] under the hospitalist service, he wants her kept n.p.o. and he hopes to do her surgery today unless he cannot get OR time.  2:57 AM Dr. Alcario Drought, hospitalist will admit.  Final Clinical Impressions(s) / ED Diagnoses   Final diagnoses:  Fall at nursing home, initial encounter  Closed displaced intertrochanteric fracture of right femur, initial encounter [place])    Plan admission to [facility]  Rolland Porter, MD, Barbette Or, MD 08/03/18 [phone]

## 2018-08-03 NOTE — Progress Notes (Signed)
Initial Nutrition Assessment  DOCUMENTATION CODES:   Non-severe (moderate) malnutrition in context of chronic illness  INTERVENTION:    Chocolate Ensure Enlive po once daily with breakfast, each supplement provides 350 kcal and 20 grams of protein  Vanilla Magic cup BID with lunch and supper, each supplement provides 290 kcal and 9 grams of protein  Multivitamin daily  NUTRITION DIAGNOSIS:   Moderate Malnutrition related to chronic illness(Alzheimer's disease) as evidenced by mild fat depletion, mild muscle depletion.  GOAL:   Patient will meet greater than or equal to 90% of their needs  MONITOR:   PO intake, Supplement acceptance, Skin  REASON FOR ASSESSMENT:   Consult Hip fracture protocol  ASSESSMENT:   77 yo female with PMH of DM, HTN, CAD, Alzheimer's disease, stroke who was admitted on 12/8 with R hip fracture s/p fall at SNF.   Scheduled for hip repair surgery this afternoon.  Patient reports intake at SNF PTA was "same as now." She has been NPO since admission. She drank a "chocolate milk" supplement in the morning with breakfast every day PTA. She drinks what she can and leaves the rest. Unable to provide much reliable nutrition history. Medications PTA included Remeron daily, MVI daily, and a nutritional supplement TID.  Labs reviewed. CBG's: 167  Medications reviewed and include novolog, lantus, remeron.  Per review of weight encounters, no significant weight changes noted.  NUTRITION - FOCUSED PHYSICAL EXAM:    Most Recent Value  Orbital Region  Mild depletion  Upper Arm Region  No depletion  Thoracic and Lumbar Region  No depletion  Buccal Region  Mild depletion  Temple Region  Moderate depletion  Clavicle Bone Region  No depletion  Clavicle and Acromion Bone Region  Mild depletion  Scapular Bone Region  Mild depletion  Dorsal Hand  Mild depletion  Patellar Region  No depletion  Anterior Thigh Region  No depletion  Posterior Calf Region  No  depletion  Edema (RD Assessment)  None  Hair  Reviewed  Eyes  Reviewed  Mouth  Reviewed  Skin  Reviewed  Nails  Reviewed       Diet Order:   Diet Order            Diet NPO time specified  Diet effective now              EDUCATION NEEDS:   No education needs have been identified at this time  Skin:  Skin Assessment: Reviewed RN Assessment  Last BM:  12/8  Height:   Ht Readings from Last 1 Encounters:  08/03/18 5\' 3"  (1.6 m)    Weight:   Wt Readings from Last 1 Encounters:  08/03/18 62.6 kg    Ideal Body Weight:  52.3 kg  BMI:  Body mass index is 24.45 kg/m.  Estimated Nutritional Needs:   Kcal:  1500-1700  Protein:  80-90 gm  Fluid:  >/= 1.5 L    Joaquin CourtsKimberly Harris, RD, LDN, CNSC Pager [phone] After Hours Pager [phone]

## 2018-08-03 NOTE — Interval H&P Note (Signed)
History and Physical Interval Note:  08/03/2018 3:19 PM  Kristin Grant  has presented today for surgery, with the diagnosis of hip fracture  The various methods of treatment have been discussed with the patient and family. After consideration of risks, benefits and other options for treatment, the patient has consented to  Procedure(s): INTRAMEDULLARY (IM) NAIL INTERTROCHANTRIC (Right) as a surgical intervention .  The patient's history has been reviewed, patient examined, no change in status, stable for surgery.  I have reviewed the patient's chart and labs.  Questions were answered to the patient's satisfaction.     Eldred MangesMark C Yates

## 2018-08-03 NOTE — ED Notes (Signed)
Ph call to son per pt request, advised of status and [facility]

## 2018-08-03 NOTE — Op Note (Signed)
Preop diagnosis: Right comminuted intertrochanteric hip fracture.  Postop diagnosis: Same  Procedure: Right trochanteric nail with interlock for right comminuted closed intertrochanteric hip fracture.  Surgeon: Annell GreeningMark Albirta Rhinehart, MD  Assistant: Zonia KiefJames Owens PA-C present for the procedure until closure.  EBL 300 cc  Implantsbiomet affixis 11 short nail 100 mm lag screw and 34 mm distal interlock.  Procedure after induction of general anesthesia with recommendation patient placed on the fracture table using the Hana boot attachment well leg holder C-arm was brought in reduction was performed with distraction internal rotation and slight abduction.  There is near-anatomic position of the fracture and prepping with DuraPrep was performed.  Area squared with towels large shower curtain Betadine Steri-Drape applied timeout procedure preoperative Ancef prophylaxis.  Incision was made proximal to trochanter gluteus medius was split tip the trochanter was palpated and identified with C arm with the tip of the Steinmann pin drilled overreamed and then the short leg millimeter Biomet affixes nail was inserted.  It was advanced under fluoroscopy the appropriate position and then pin position center center in the head reaming and then placement 100 mm screw.  Screw was locked in proximally.  Distal interlock screw static was then placed measured and implanted 34 mm.  Final spot pictures were taken irrigation with saline solution.  Closure deep layer with running Vicryl.  2-0 Vicryl subtenons tissue skin staple closure postop dressing and then transferred recovery in stable condition.

## 2018-08-03 NOTE — Consult Note (Addendum)
Reason for Consult: Fall with right closed intertrochanteric hip fracture Referring Physician:  Dr.  Maryellen Grant Kristin Grant is an 77 y.o. female.  HPI: 77 year old female nursing facility patient try to get up to go to the bathroom her walker was just out of reach try to reach forward and fell injuring her right hip with a closed intertrochanteric hip fracture.  She did not hit her head no loss of consciousness no syncope.  She is to ambulate with a cane was recommended by her doctor to start using a walker more than 1 year ago.  She has some diabetes with neuropathy.  Coronary artery disease hypertension and reflux.  Past Medical History:  Diagnosis Date  . Alzheimer disease (HCC)   . Arthritis   . Coronary artery disease   . Diabetes mellitus   . Difficulty walking   . Hypertension   . Reflux   . Stroke [facility])     Past Surgical History:  Procedure Laterality Date  . APPENDECTOMY    . FINGER DEBRIDEMENT      Family History  Problem Relation Age of Onset  . Hypertension Mother   . Heart disease Mother   . Hypertension Father   . Heart disease Father   . Hypertension Sister   . Heart disease Sister   . Cancer Sister        BREAST   . Hypertension Brother   . Heart disease Brother     Social History:  reports that she has never smoked. She has never used smokeless tobacco. She reports that she does not drink alcohol or use drugs.  Allergies: No Known Allergies  Medications: I have reviewed the patient's current medications.  Results for orders placed or performed during the hospital encounter of 08/02/18 (from the past 48 hour(s))  Comprehensive metabolic panel     Status: Abnormal   Collection Time: 08/03/18  1:46 AM  Result Value Ref Range   Sodium 141 135 - 145 mmol/L   Potassium 3.8 3.5 - 5.1 mmol/L   Chloride 107 98 - 111 mmol/L   CO2 28 22 - 32 mmol/L   Glucose, Bld 163 (H) 70 - 99 mg/dL   BUN 14 8 - 23 mg/dL   Creatinine, Ser 1.61 0.44 - 1.00 mg/dL   Calcium 8.7 (L) 8.9 - 10.3 mg/dL   Total Protein 6.4 (L) 6.5 - 8.1 g/dL   Albumin 3.6 3.5 - 5.0 g/dL   AST 20 15 - 41 U/L   ALT 19 0 - 44 U/L   Alkaline Phosphatase 50 38 - 126 U/L   Total Bilirubin 0.6 0.3 - 1.2 mg/dL   GFR calc non Af Amer >60 >60 mL/min   GFR calc Af Amer >60 >60 mL/min   Anion gap 6 5 - 15    Comment: Performed at  [facility], [address] [place], [place] [address]  CBC with Differential     Status: Abnormal   Collection Time: 08/03/18  1:46 AM  Result Value Ref Range   WBC 15.1 (H) 4.0 - 10.5 K/uL   RBC 5.05 3.87 - 5.11 MIL/uL   Hemoglobin 12.9 12.0 - 15.0 g/dL   HCT 54.0 98.1 - 19.1 %   MCV 86.1 80.0 - 100.0 fL   MCH 25.5 (L) 26.0 - 34.0 pg   MCHC 29.7 (L) 30.0 - 36.0 g/dL   RDW 47.8 29.5 - 62.1 %   Platelets 149 (L) 150 - 400 K/uL   nRBC 0.0 0.0 -  0.2 %   Neutrophils Relative % 86 %   Neutro Abs 12.9 (H) 1.7 - 7.7 K/uL   Lymphocytes Relative 7 %   Lymphs Abs 1.0 0.7 - 4.0 K/uL   Monocytes Relative 5 %   Monocytes Absolute 0.8 0.1 - 1.0 K/uL   Eosinophils Relative 1 %   Eosinophils Absolute 0.2 0.0 - 0.5 K/uL   Basophils Relative 0 %   Basophils Absolute 0.1 0.0 - 0.1 K/uL   Immature Granulocytes 1 %   Abs Immature Granulocytes 0.20 (H) 0.00 - 0.07 K/uL    Comment: Performed at [facility], [address] [place], [place] [address]  Protime-INR     Status: None   Collection Time: 08/03/18  1:46 AM  Result Value Ref Range   Prothrombin Time 14.3 11.4 - 15.2 seconds   INR 1.12     Comment: Performed at [facility], [address] [place], [place] [address]  APTT     Status: None   Collection Time: 08/03/18  1:46 AM  Result Value Ref Range   aPTT 31 24 - 36 seconds    Comment: Performed at [facility], [address] [place], [place] [address]  Glucose, capillary     Status: Abnormal   Collection Time: 08/03/18  7:38 AM  Result Value Ref Range   Glucose-Capillary 167 (H) 70 - 99 mg/dL    Dg Chest 1 View  Result Date:  08/03/2018 CLINICAL DATA:  Seven 77-year-old female with fall and right hip pain. EXAM: CHEST  1 VIEW COMPARISON:  Chest radiograph dated 01/17/2017 FINDINGS: Mild chronic interstitial coarsening. No focal consolidation, pleural effusion, or pneumothorax. Minimal left lung base atelectasis/scarring. The cardiac silhouette is within normal limits. Coronary vascular calcifications. No acute osseous pathology. IMPRESSION: No active disease. Electronically Signed   By: Elgie CollardArash  Radparvar M.D.   On: 08/03/2018 01:26   Dg Hip Unilat W Or Wo Pelvis 2-3 Views Right  Result Date: 08/03/2018 CLINICAL DATA:  77 year old female with fall and right hip pain. EXAM: DG HIP (WITH OR WITHOUT PELVIS) 2-3V RIGHT COMPARISON:  None. FINDINGS: There is mildly displaced intertrochanteric fracture of the right femoral neck with mild varus angulation. There is advanced osteopenia. No dislocation. Vascular calcifications noted. The soft tissues are unremarkable. IMPRESSION: Mildly displaced intertrochanteric fracture of the right femoral neck. Electronically Signed   By: Elgie CollardArash  Radparvar M.D.   On: 08/03/2018 01:25    ROS 14 point review of systems positive for some Alzheimer's disease coronary artery disease, diabetes with peripheral neuropathy, hypertension, reflux, previous CVA, patient does not smoke or drink.  Previous right proximal humerus fracture with routine healing January 2019 Blood pressure (!) 137/53, pulse 82, temperature 99.1 F (37.3 C), temperature source Oral, resp. rate 16, height 5\' 3"  (1.6 m), weight 62.6 kg, SpO2 96 %. Physical Exam  Constitutional: She is oriented to person, place, and time. She appears well-developed and well-nourished.  Alert oriented conversant.  Oriented x4.  HENT:  Head: Normocephalic and atraumatic.  Eyes: Pupils are equal, round, and reactive to light.  Glasses  Neck: Normal range of motion. Neck supple. No tracheal deviation present. No thyromegaly present.  Cardiovascular:  Normal rate.  Respiratory: Effort normal. No respiratory distress. She has no wheezes. She has no rales. She exhibits no tenderness.  GI: Soft.  Musculoskeletal:  Right lower extremity shortening with external rotation.  Distal pulses are intact.  Sciatic   intact sensory.  Neurological: She is alert and oriented to person, place, and  time.  Psychiatric: She has a normal mood and affect. Her behavior is normal.    Assessment/Plan: Displaced shortened comminuted closed right intertrochanteric hip fracture in a patient that ambulates with a walker.  She has multiple medical problems but was an independent ambulator with her walker.  Plan her intertrochanteric hip fracture stabilization with trochanteric nail this afternoon.  Plan procedure discussed with patient risks of surgery discussed she understands request we proceed.  Urgent preoperative orthopedic order set entered.  Surgery today at approximately 3 PM.  N.p.o. for surgery  Eldred Manges 08/03/2018, 9:18 AM

## 2018-08-03 NOTE — H&P (View-Only) (Signed)
 Reason for Consult: Fall with right closed intertrochanteric hip fracture Referring Physician:  Dr.  R.Rai   Kristin Grant is an 77 y.o. female.  HPI: 77-year-old female nursing facility patient try to get up to go to the bathroom her walker was just out of reach try to reach forward and fell injuring her right hip with a closed intertrochanteric hip fracture.  She did not hit her head no loss of consciousness no syncope.  She is to ambulate with a cane was recommended by her doctor to start using a walker more than 1 year ago.  She has some diabetes with neuropathy.  Coronary artery disease hypertension and reflux.  Past Medical History:  Diagnosis Date  . Alzheimer disease (HCC)   . Arthritis   . Coronary artery disease   . Diabetes mellitus   . Difficulty walking   . Hypertension   . Reflux   . Stroke (HCC)     Past Surgical History:  Procedure Laterality Date  . APPENDECTOMY    . FINGER DEBRIDEMENT      Family History  Problem Relation Age of Onset  . Hypertension Mother   . Heart disease Mother   . Hypertension Father   . Heart disease Father   . Hypertension Sister   . Heart disease Sister   . Cancer Sister        BREAST   . Hypertension Brother   . Heart disease Brother     Social History:  reports that she has never smoked. She has never used smokeless tobacco. She reports that she does not drink alcohol or use drugs.  Allergies: No Known Allergies  Medications: I have reviewed the patient's current medications.  Results for orders placed or performed during the hospital encounter of 08/02/18 (from the past 48 hour(s))  Comprehensive metabolic panel     Status: Abnormal   Collection Time: 08/03/18  1:46 AM  Result Value Ref Range   Sodium 141 135 - 145 mmol/L   Potassium 3.8 3.5 - 5.1 mmol/L   Chloride 107 98 - 111 mmol/L   CO2 28 22 - 32 mmol/L   Glucose, Bld 163 (H) 70 - 99 mg/dL   BUN 14 8 - 23 mg/dL   Creatinine, Ser 0.81 0.44 - 1.00 mg/dL   Calcium 8.7 (L) 8.9 - 10.3 mg/dL   Total Protein 6.4 (L) 6.5 - 8.1 g/dL   Albumin 3.6 3.5 - 5.0 g/dL   AST 20 15 - 41 U/L   ALT 19 0 - 44 U/L   Alkaline Phosphatase 50 38 - 126 U/L   Total Bilirubin 0.6 0.3 - 1.2 mg/dL   GFR calc non Af Amer >60 >60 mL/min   GFR calc Af Amer >60 >60 mL/min   Anion gap 6 5 - 15    Comment: Performed at [facility], [address] [place], [place] [address]  CBC with Differential     Status: Abnormal   Collection Time: 08/03/18  1:46 AM  Result Value Ref Range   WBC 15.1 (H) 4.0 - 10.5 K/uL   RBC 5.05 3.87 - 5.11 MIL/uL   Hemoglobin 12.9 12.0 - 15.0 g/dL   HCT 43.5 36.0 - 46.0 %   MCV 86.1 80.0 - 100.0 fL   MCH 25.5 (L) 26.0 - 34.0 pg   MCHC 29.7 (L) 30.0 - 36.0 g/dL   RDW 14.8 11.5 - 15.5 %   Platelets 149 (L) 150 - 400 K/uL   nRBC 0.0 0.0 -   0.2 %   Neutrophils Relative % 86 %   Neutro Abs 12.9 (H) 1.7 - 7.7 K/uL   Lymphocytes Relative 7 %   Lymphs Abs 1.0 0.7 - 4.0 K/uL   Monocytes Relative 5 %   Monocytes Absolute 0.8 0.1 - 1.0 K/uL   Eosinophils Relative 1 %   Eosinophils Absolute 0.2 0.0 - 0.5 K/uL   Basophils Relative 0 %   Basophils Absolute 0.1 0.0 - 0.1 K/uL   Immature Granulocytes 1 %   Abs Immature Granulocytes 0.20 (H) 0.00 - 0.07 K/uL    Comment: Performed at [facility], [address] [place], [place] [address]  Protime-INR     Status: None   Collection Time: 08/03/18  1:46 AM  Result Value Ref Range   Prothrombin Time 14.3 11.4 - 15.2 seconds   INR 1.12     Comment: Performed at [facility], [address] [place], [place] [address]  APTT     Status: None   Collection Time: 08/03/18  1:46 AM  Result Value Ref Range   aPTT 31 24 - 36 seconds    Comment: Performed at [facility], [address] [place], [place] [address]  Glucose, capillary     Status: Abnormal   Collection Time: 08/03/18  7:38 AM  Result Value Ref Range   Glucose-Capillary 167 (H) 70 - 99 mg/dL    Dg Chest 1 View  Result Date:  08/03/2018 CLINICAL DATA:  Seven 7-year-old female with fall and right hip pain. EXAM: CHEST  1 VIEW COMPARISON:  Chest radiograph dated 01/17/2017 FINDINGS: Mild chronic interstitial coarsening. No focal consolidation, pleural effusion, or pneumothorax. Minimal left lung base atelectasis/scarring. The cardiac silhouette is within normal limits. Coronary vascular calcifications. No acute osseous pathology. IMPRESSION: No active disease. Electronically Signed   By: Arash  Radparvar M.D.   On: 08/03/2018 01:26   Dg Hip Unilat W Or Wo Pelvis 2-3 Views Right  Result Date: 08/03/2018 CLINICAL DATA:  77-year-old female with fall and right hip pain. EXAM: DG HIP (WITH OR WITHOUT PELVIS) 2-3V RIGHT COMPARISON:  None. FINDINGS: There is mildly displaced intertrochanteric fracture of the right femoral neck with mild varus angulation. There is advanced osteopenia. No dislocation. Vascular calcifications noted. The soft tissues are unremarkable. IMPRESSION: Mildly displaced intertrochanteric fracture of the right femoral neck. Electronically Signed   By: Arash  Radparvar M.D.   On: 08/03/2018 01:25    ROS 14 point review of systems positive for some Alzheimer's disease coronary artery disease, diabetes with peripheral neuropathy, hypertension, reflux, previous CVA, patient does not smoke or drink.  Previous right proximal humerus fracture with routine healing January 2019 Blood pressure (!) 137/53, pulse 82, temperature 99.1 F (37.3 C), temperature source Oral, resp. rate 16, height 5' 3" (1.6 m), weight 62.6 kg, SpO2 96 %. Physical Exam  Constitutional: She is oriented to person, place, and time. She appears well-developed and well-nourished.  Alert oriented conversant.  Oriented x4.  HENT:  Head: Normocephalic and atraumatic.  Eyes: Pupils are equal, round, and reactive to light.  Glasses  Neck: Normal range of motion. Neck supple. No tracheal deviation present. No thyromegaly present.  Cardiovascular:  Normal rate.  Respiratory: Effort normal. No respiratory distress. She has no wheezes. She has no rales. She exhibits no tenderness.  GI: Soft.  Musculoskeletal:  Right lower extremity shortening with external rotation.  Distal pulses are intact.  Sciatic   intact sensory.  Neurological: She is alert and oriented to person, place, and   time.  Psychiatric: She has a normal mood and affect. Her behavior is normal.    Assessment/Plan: Displaced shortened comminuted closed right intertrochanteric hip fracture in a patient that ambulates with a walker.  She has multiple medical problems but was an independent ambulator with her walker.  Plan her intertrochanteric hip fracture stabilization with trochanteric nail this afternoon.  Plan procedure discussed with patient risks of surgery discussed she understands request we proceed.  Urgent preoperative orthopedic order set entered.  Surgery today at approximately 3 PM.  N.p.o. for surgery  Kristin Grant 08/03/2018, 9:18 AM      

## 2018-08-03 NOTE — Anesthesia Preprocedure Evaluation (Signed)
Anesthesia Evaluation  Patient identified by MRN, date of birth, ID band Patient awake    Reviewed: Allergy & Precautions, H&P , NPO status , Patient's Chart, lab work & pertinent test results, reviewed documented beta blocker date and time   Airway Mallampati: II  TM Distance: >3 FB Neck ROM: full    Dental no notable dental hx.    Pulmonary neg pulmonary ROS,    Pulmonary exam normal breath sounds clear to auscultation       Cardiovascular Exercise Tolerance: Good hypertension, Pt. on medications + CAD   Rhythm:regular Rate:Normal  ECHO 19 - Left ventricle: The cavity size was normal. Wall thickness was   normal. Systolic function was normal. The estimated ejection   fraction was in the range of 55% to 60%. Wall motion was normal;   there were no regional wall motion abnormalities. Doppler   parameters are consistent with abnormal left ventricular   relaxation (grade 1 diastolic dysfunction).   Neuro/Psych PSYCHIATRIC DISORDERS Dementia CVA    GI/Hepatic negative GI ROS, Neg liver ROS,   Endo/Other  diabetesHypothyroidism   Renal/GU negative Renal ROS  negative genitourinary   Musculoskeletal  (+) Arthritis , Osteoarthritis,    Abdominal   Peds  Hematology negative hematology ROS (+)   Anesthesia Other Findings   Reproductive/Obstetrics negative OB ROS                             Anesthesia Physical Anesthesia Plan  ASA: III  Anesthesia Plan: General   Post-op Pain Management:    Induction: Intravenous  PONV Risk Score and Plan: 3 and Ondansetron and Treatment may vary due to age or medical condition  Airway Management Planned: LMA and Oral ETT  Additional Equipment:   Intra-op Plan:   Post-operative Plan: Extubation in OR  Informed Consent: I have reviewed the patients History and Physical, chart, labs and discussed the procedure including the risks, benefits and  alternatives for the proposed anesthesia with the patient or authorized representative who has indicated his/her understanding and acceptance.   Dental Advisory Given  Plan Discussed with: CRNA, Anesthesiologist and Surgeon  Anesthesia Plan Comments: ( )        Anesthesia Quick Evaluation

## 2018-08-03 NOTE — Progress Notes (Signed)
[facility]                                                                              Patient Demographics  Kristin Grant, is a 77 y.o. female, DOB - 05-07-1941, ZOX:[id number]  Admit date - 08/02/2018   Admitting Physician Hillary BowJared M Gardner, DO  Outpatient [facility] the [facility], Velna HatchetKawanta F, MD  Outpatient specialists:   LOS - 0  days   Medical records reviewed and are as summarized below:    Chief Complaint  Patient presents with  . Fall       Brief summary   Patient is a 77 year old female with history of Alzheimer's disease, CAD, diabetes, hypertension, GERD, prior CVA presented from skilled nursing facility after a mechanical fall with severe pain to right hip after the fall. Right hip x-ray showed mildly displaced intertrochanteric fracture of the right femoral neck  Assessment & Plan    Principal Problem:   Closed intertrochanteric fracture of hip, right, initial encounter (HCC) -Status post mechanical fall, orthopedics consulted -Plan for OR today, continue IV fluid hydration -DVT prophylaxis, pain control per orthopedics  Active Problems:   Dementia (HCC) -Currently stable, continue Remeron after surgery    Hypertension -Currently stable, continue propranolol and lisinopril  Prior history of CVA -Currently n.p.o., after surgery will restart aspirin and Plavix    Hypothyroidism -Continue Synthroid    Diabetes mellitus, type II (HCC) -Currently n.p.o., continue sliding scale insulin every 4 hours, Lantus 10 units daily, will need to adjust insulin regimen once back on diet    Coronary artery disease -Currently no active cardiac symptoms, no chest pain or shortness of breath, continue aspirin and Plavix after surgery   Code Status: Full CODE STATUS DVT Prophylaxis:   SCD's, will start prophylactic DVT prophylaxis once cleared by orthopedics Family Communication: Discussed in detail with the patient, all imaging results,  lab results explained to the patient   Disposition Plan: Plan for OR today  Time Spent in minutes   25 minutes  Procedures:  None  Consultants:   Orthopedics  Antimicrobials:      Medications  Scheduled Meds: . insulin aspart  0-9 Units Subcutaneous Q4H  . insulin glargine  10 Units Subcutaneous QHS  . levothyroxine  50 mcg Oral Daily  . lisinopril  40 mg Oral Daily  . mirtazapine  15 mg Oral QHS  . pantoprazole  40 mg Oral Daily  . propranolol  40 mg Oral BID   Continuous Infusions: . sodium chloride 75 mL/hr at 08/03/18 0409   PRN Meds:.HYDROcodone-acetaminophen, methocarbamol, morphine injection   Antibiotics   Anti-infectives (From admission, onward)   None        Subjective:   Kristin Grant was seen and examined today.  Per patient having pain in the right hip, 8/10, awaiting surgery. Patient denies dizziness, chest pain, shortness of breath, abdominal pain, N/V/D/C.  No fevers  Objective:   Vitals:   08/03/18 0543 08/03/18 0556 08/03/18 0647 08/03/18 0651  BP:  136/72 (!) 137/53   Pulse:  72 82   Resp:  (!) 23 16   Temp: 98.1 F (  36.7 C)   99.1 F (37.3 C)  TempSrc: Oral   Oral  SpO2:  91% 96%   Weight:      Height:       No intake or output data in the 24 hours ending 08/03/18 0934   Wt Readings from Last 3 Encounters:  08/03/18 62.6 kg  06/04/18 60.8 kg  05/20/18 60 kg     Exam  General: Alert and oriented x 3, NAD  Eyes:  HEENT:  Atraumatic, normocephalic  Cardiovascular: S1 S2 auscultated,  Regular rate and rhythm.  Respiratory: Clear to auscultation bilaterally, no wheezing, rales or rhonchi  Gastrointestinal: Soft, nontender, nondistended, + bowel sounds  Ext: no pedal edema bilaterally  Neuro: Able to wiggle toes, pain in the right lower extremity, strength 5/5 in the upper extremities bilaterally  Musculoskeletal: No digital cyanosis, clubbing  Skin: No rashes  Psych: Normal affect and demeanor, alert and  oriented x3    Data Reviewed:  I have personally reviewed following labs and imaging studies  Micro Results No results found for this or any previous visit (from the past 240 hour(s)).  Radiology Reports Dg Chest 1 View  Result Date: 08/03/2018 CLINICAL DATA:  Seven 58-year-old female with fall and right hip pain. EXAM: CHEST  1 VIEW COMPARISON:  Chest radiograph dated 01/17/2017 FINDINGS: Mild chronic interstitial coarsening. No focal consolidation, pleural effusion, or pneumothorax. Minimal left lung base atelectasis/scarring. The cardiac silhouette is within normal limits. Coronary vascular calcifications. No acute osseous pathology. IMPRESSION: No active disease. Electronically Signed   By: Elgie Collard M.D.   On: 08/03/2018 01:26   Dg Hip Unilat W Or Wo Pelvis 2-3 Views Right  Result Date: 08/03/2018 CLINICAL DATA:  77 year old female with fall and right hip pain. EXAM: DG HIP (WITH OR WITHOUT PELVIS) 2-3V RIGHT COMPARISON:  None. FINDINGS: There is mildly displaced intertrochanteric fracture of the right femoral neck with mild varus angulation. There is advanced osteopenia. No dislocation. Vascular calcifications noted. The soft tissues are unremarkable. IMPRESSION: Mildly displaced intertrochanteric fracture of the right femoral neck. Electronically Signed   By: Elgie Collard M.D.   On: 08/03/2018 01:25    Lab Data:  CBC: Recent Labs  Lab 08/03/18 0146  WBC 15.1*  NEUTROABS 12.9*  HGB 12.9  HCT 43.5  MCV 86.1  PLT 149*   Basic Metabolic Panel: Recent Labs  Lab 08/03/18 0146  NA 141  K 3.8  CL 107  CO2 28  GLUCOSE 163*  BUN 14  CREATININE 0.81  CALCIUM 8.7*   GFR: Estimated Creatinine Clearance: 46.6 mL/min (by C-G formula based on SCr of 0.81 mg/dL). Liver Function Tests: Recent Labs  Lab 08/03/18 0146  AST 20  ALT 19  ALKPHOS 50  BILITOT 0.6  PROT 6.4*  ALBUMIN 3.6   No results for input(s): LIPASE, AMYLASE in the last 168 hours. No results for  input(s): AMMONIA in the last 168 hours. Coagulation Profile: Recent Labs  Lab 08/03/18 0146  INR 1.12   Cardiac Enzymes: No results for input(s): CKTOTAL, CKMB, CKMBINDEX, TROPONINI in the last 168 hours. BNP (last 3 results) No results for input(s): PROBNP in the last 8760 hours. HbA1C: No results for input(s): HGBA1C in the last 72 hours. CBG: Recent Labs  Lab 08/03/18 0738  GLUCAP 167*   Lipid Profile: No results for input(s): CHOL, HDL, LDLCALC, TRIG, CHOLHDL, LDLDIRECT in the last 72 hours. Thyroid Function Tests: No results for input(s): TSH, T4TOTAL, FREET4, T3FREE, THYROIDAB in the last  72 hours. Anemia Panel: No results for input(s): VITAMINB12, FOLATE, FERRITIN, TIBC, IRON, RETICCTPCT in the last 72 hours. Urine analysis:    Component Value Date/Time   COLORURINE YELLOW 11/14/2017 2237   APPEARANCEUR CLEAR 11/14/2017 2237   LABSPEC 1.018 11/14/2017 2237   PHURINE 6.0 11/14/2017 2237   GLUCOSEU 50 (A) 11/14/2017 2237   HGBUR NEGATIVE 11/14/2017 2237   BILIRUBINUR NEGATIVE 11/14/2017 2237   KETONESUR NEGATIVE 11/14/2017 2237   PROTEINUR NEGATIVE 11/14/2017 2237   UROBILINOGEN 0.2 10/11/2013 0634   NITRITE NEGATIVE 11/14/2017 2237   LEUKOCYTESUR MODERATE (A) 11/14/2017 2237       M.D. [facility] 08/03/2018, 9:34 AM  Pager: [phone] Between 7am to 7pm - call Pager - [phone]  After 7pm go to [email or web address] - password [email or web address]  Call night coverage person covering after 7pm

## 2018-08-03 NOTE — ED Notes (Signed)
Pt off unit at this time via [facility] 

## 2018-08-04 ENCOUNTER — Encounter (HOSPITAL_COMMUNITY): Payer: Self-pay | Admitting: Orthopaedic Surgery

## 2018-08-04 ENCOUNTER — Other Ambulatory Visit: Payer: Self-pay

## 2018-08-04 DIAGNOSIS — E1100 Type 2 diabetes mellitus with hyperosmolarity without nonketotic hyperglycemic-hyperosmolar coma (NKHHC): Secondary | ICD-10-CM

## 2018-08-04 LAB — URINALYSIS, ROUTINE W REFLEX MICROSCOPIC
Bilirubin Urine: NEGATIVE
Glucose, UA: NEGATIVE mg/dL
Hgb urine dipstick: NEGATIVE
Ketones, ur: NEGATIVE mg/dL
Nitrite: NEGATIVE
Protein, ur: NEGATIVE mg/dL
Specific Gravity, Urine: 1.021 (ref 1.005–1.030)
WBC, UA: >50 WBC/hpf — ABNORMAL HIGH (ref 0–5)
pH: 5 (ref 5.0–8.0)

## 2018-08-04 LAB — HEMOGLOBIN A1C
Hgb A1c MFr Bld: 5.8 % — ABNORMAL HIGH (ref 4.8–5.6)
MEAN PLASMA GLUCOSE: 119.76 mg/dL

## 2018-08-04 LAB — CBC
HCT: 27.3 % — ABNORMAL LOW (ref 36.0–46.0)
Hemoglobin: 8.1 g/dL — ABNORMAL LOW (ref 12.0–15.0)
MCH: 25.3 pg — ABNORMAL LOW (ref 26.0–34.0)
MCHC: 29.7 g/dL — ABNORMAL LOW (ref 30.0–36.0)
MCV: 85.3 fL (ref 80.0–100.0)
PLATELETS: 129 10*3/uL — AB (ref 150–400)
RBC: 3.2 MIL/uL — ABNORMAL LOW (ref 3.87–5.11)
RDW: 15 % (ref 11.5–15.5)
WBC: 11 10*3/uL — ABNORMAL HIGH (ref 4.0–10.5)
nRBC: 0 % (ref 0.0–0.2)

## 2018-08-04 LAB — GLUCOSE, CAPILLARY
Glucose-Capillary: 171 mg/dL — ABNORMAL HIGH (ref 70–99)
Glucose-Capillary: 142 mg/dL — ABNORMAL HIGH (ref 70–99)
Glucose-Capillary: 144 mg/dL — ABNORMAL HIGH (ref 70–99)
Glucose-Capillary: 157 mg/dL — ABNORMAL HIGH (ref 70–99)
Glucose-Capillary: 144 mg/dL — ABNORMAL HIGH (ref 70–99)
Glucose-Capillary: 220 mg/dL — ABNORMAL HIGH (ref 70–99)

## 2018-08-04 LAB — BASIC METABOLIC PANEL
Anion gap: 9 (ref 5–15)
BUN: 19 mg/dL (ref 8–23)
CO2: 24 mmol/L (ref 22–32)
CREATININE: 1.03 mg/dL — AB (ref 0.44–1.00)
Calcium: 7.6 mg/dL — ABNORMAL LOW (ref 8.9–10.3)
Chloride: 110 mmol/L (ref 98–111)
GFR calc Af Amer: 60 mL/min — ABNORMAL LOW (ref >60)
GFR calc non Af Amer: 52 mL/min — ABNORMAL LOW (ref >60)
Glucose, Bld: 169 mg/dL — ABNORMAL HIGH (ref 70–99)
Potassium: 4 mmol/L (ref 3.5–5.1)
Sodium: 143 mmol/L (ref 135–145)

## 2018-08-04 MED ORDER — SODIUM CHLORIDE 0.9 % IV BOLUS: 500.0000 mL | Freq: Once | INTRAVENOUS | Status: DC

## 2018-08-04 MED ORDER — ACETAMINOPHEN 325 MG PO TABS
650.0000 mg | ORAL_TABLET | Freq: Four times a day (QID) | ORAL | Status: DC | PRN
  Administered 2018-08-04 – 2018-08-05 (×4): 650 mg via ORAL
  Filled 2018-08-04 (×4): qty 2

## 2018-08-04 MED ORDER — POLYETHYLENE GLYCOL 3350 17 G PO PACK
17.0000 g | PACK | Freq: Every day | ORAL | Status: DC
  Administered 2018-08-04 – 2018-08-06 (×3): 17 g via ORAL
  Filled 2018-08-04 (×3): qty 1

## 2018-08-04 MED ORDER — ASPIRIN 325 MG PO TBEC: 325.0000 mg | DELAYED_RELEASE_TABLET | Freq: Every day | ORAL | 0 refills | Status: DC

## 2018-08-04 MED ORDER — INSULIN ASPART 100 UNIT/ML ~~LOC~~ SOLN
0.0000 [IU] | Freq: Every day | SUBCUTANEOUS | Status: DC
  Administered 2018-08-04: 2 [IU] via SUBCUTANEOUS

## 2018-08-04 MED ORDER — INSULIN ASPART 100 UNIT/ML ~~LOC~~ SOLN
0.0000 [IU] | Freq: Three times a day (TID) | SUBCUTANEOUS | Status: DC
  Administered 2018-08-04: 1 [IU] via SUBCUTANEOUS
  Administered 2018-08-04: 2 [IU] via SUBCUTANEOUS
  Administered 2018-08-05: 3 [IU] via SUBCUTANEOUS
  Administered 2018-08-05: 5 [IU] via SUBCUTANEOUS
  Administered 2018-08-06: 1 [IU] via SUBCUTANEOUS
  Administered 2018-08-06: 2 [IU] via SUBCUTANEOUS

## 2018-08-04 MED ORDER — CLOPIDOGREL BISULFATE 75 MG PO TABS
75.0000 mg | ORAL_TABLET | Freq: Every morning | ORAL | Status: DC
  Administered 2018-08-04 – 2018-08-06 (×3): 75 mg via ORAL
  Filled 2018-08-04 (×3): qty 1

## 2018-08-04 MED ORDER — HYDROCODONE-ACETAMINOPHEN 5-325 MG PO TABS: 1.0000 | ORAL_TABLET | Freq: Four times a day (QID) | ORAL | 0 refills | Status: DC | PRN

## 2018-08-04 MED FILL — Fentanyl Citrate Preservative Free (PF) Inj 100 MCG/2ML: INTRAMUSCULAR | Qty: 2 | Status: AC

## 2018-08-04 NOTE — Evaluation (Signed)
Occupational Therapy Evaluation Patient Details Name: Kristin Grant MRN: [id number] DOB: Mar 10, 1941 Today's Date: 08/04/2018    History of Present Illness 77 y.o. female with medical history significant of Alzheimer's disease, CAD, stroke, HTN, DM2. Presenting post mechanical fall in bathroom sustained R hip intertrochanteric fx. S/p IM nail placement on 12/9.   Clinical Impression   PTA, pt was living at [facility] and was able to perform BADLs with assistance for bathing; used RW for functional mobility. Pt currently requiring Min A for UB ADLs, Max A +2 for LB ADLs, and Max A +2 for functional transfers. Pt presenting with decreased strength, balance, adherence to WB status, and activity tolerance. Pt would benefit from further acute OT to facilitate safe dc. Recommend dc to SNF for further OT to optimize safety, independence with ADLs, and return to PLOF.      Follow Up Recommendations  SNF;Supervision/Assistance - 24 hour    Equipment Recommendations  Other (comment);Wheelchair (measurements OT);Wheelchair cushion (measurements OT)(Defer to next venue)    Recommendations for Other Services PT consult     Precautions / Restrictions Precautions Precautions: Fall Restrictions Weight Bearing Restrictions: Yes RLE Weight Bearing: Partial weight bearing RLE Partial Weight Bearing Percentage or Pounds: 50      Mobility Bed Mobility Overal bed mobility: Needs Assistance Bed Mobility: Supine to Sit     Supine to sit: Max assist;+2 for physical assistance;HOB elevated     General bed mobility comments: Max A to manage BLEs and elevate trunk. use of pad to facilitate hips towards EOB.   Transfers Overall transfer level: Needs assistance Equipment used: Rolling walker (2 wheeled) Transfers: Sit to/from [employer] Sit to Stand: Max assist;+2 physical assistance Stand pivot transfers: Max assist;+2 physical assistance       General transfer comment: Max  A+2 to power up into standing. Requiring Max A +2 to maintain standing balance and pivot towards recliner placed on left side. Poor adherance to WB status    Balance Overall balance assessment: Needs assistance;History of Falls Sitting-balance support: No upper extremity supported;Feet supported Sitting balance-Leahy Scale: Poor     Standing balance support: Bilateral upper extremity supported;During functional activity Standing balance-Leahy Scale: Poor Standing balance comment: Reliant on UE support and physical A                           ADL either performed or assessed with clinical judgement   ADL Overall ADL's : Needs assistance/impaired Eating/Feeding: Sitting;Set up Eating/Feeding Details (indicate cue type and reason): Pt eating breakfast upon arrival. Grooming: Set up;Supervision/safety;Sitting   Upper Body Bathing: Minimal assistance;Sitting   Lower Body Bathing: Maximal assistance;Sit to/from stand;+2 for physical assistance   Upper Body Dressing : Minimal assistance;Sitting   Lower Body Dressing: Maximal assistance;+2 for physical assistance;Sit to/from stand Lower Body Dressing Details (indicate cue type and reason): Donned socks Toilet Transfer: Maximal assistance;+2 for physical assistance;Stand-pivot;RW           Functional mobility during ADLs: Maximal assistance;+2 for physical assistance;Rolling walker(stand pivot only) General ADL Comments: Pt with decreased performance of LB ADLs and mobility. Pt agreeable to therapy.      Vision Baseline Vision/History: Wears glasses Wears Glasses: At all times Patient Visual Report: Other (comment)(Does not have glasses with her)       Perception     Praxis      Pertinent Vitals/Pain Pain Assessment: Faces Faces Pain Scale: Hurts little more Pain Location: RLE Pain Descriptors /  Indicators: Discomfort;Grimacing;Guarding Pain Intervention(s): Monitored during session;Limited activity within  patient's tolerance;Repositioned     Hand Dominance Right   Extremity/Trunk Assessment Upper Extremity Assessment Upper Extremity Assessment: Generalized weakness   Lower Extremity Assessment Lower Extremity Assessment: Defer to PT evaluation   Cervical / Trunk Assessment Cervical / Trunk Assessment: Kyphotic   Communication Communication Communication: No difficulties   Cognition Arousal/Alertness: Awake/alert Behavior During Therapy: Flat affect Overall Cognitive Status: History of cognitive impairments - at baseline                                 General Comments: h/o dementia   General Comments  Daughter present throughout    Exercises     Shoulder Instructions      Home Living Family/patient expects to be discharged to:: Skilled nursing facility                                 Additional Comments: [facility]      Prior Functioning/Environment Level of Independence: Needs assistance  Gait / Transfers Assistance Needed: uses RW ADL's / Homemaking Assistance Needed: assist for bathing, indep for dressing            OT Problem List: Decreased strength;Decreased range of motion;Decreased activity tolerance;Impaired balance (sitting and/or standing);Decreased safety awareness;Decreased knowledge of use of DME or AE;Decreased knowledge of precautions;Pain      OT Treatment/Interventions: Self-care/ADL training;Therapeutic exercise;Energy conservation;DME and/or AE instruction;Therapeutic activities;Patient/family education    OT Goals(Current goals can be found in the care plan section) Acute Rehab OT Goals Patient Stated Goal: daughter, "return to walking and stay safe" OT Goal Formulation: With family Time For Goal Achievement: 08/18/18 Potential to Achieve Goals: Good ADL Goals Pt Will Perform Upper Body Dressing: with set-up;with supervision;sitting Pt Will Perform Lower Body Dressing: with min assist;sit to/from stand Pt  Will Transfer to Toilet: with min assist;bedside commode;stand pivot transfer Pt Will Perform Toileting - Clothing Manipulation and hygiene: with min assist;sit to/from stand Additional ADL Goal #1: Pt will perform bed mobility with Min A in preparation for ADLs Additional ADL Goal #2: Pt will maintain PWB status during 75% of functional transfers  OT Frequency: Min 2X/week   Barriers to D/C:            Co-evaluation              AM-PAC OT "6 Clicks" Daily Activity     Outcome Measure Help from another person eating meals?: None Help from another person taking care of personal grooming?: A Little Help from another person toileting, which includes using toliet, bedpan, or urinal?: A Lot Help from another person bathing (including washing, rinsing, drying)?: A Lot Help from another person to put on and taking off regular upper body clothing?: A Little Help from another person to put on and taking off regular lower body clothing?: A Lot 6 Click Score: 16   End of Session Equipment Utilized During Treatment: Gait belt;Rolling walker Nurse Communication: Mobility status;Weight bearing status  Activity Tolerance: Patient tolerated treatment well;Patient limited by pain Patient left: in chair;with call bell/phone within reach;with chair alarm set;with family/visitor present  OT Visit Diagnosis: Unsteadiness on feet (R26.81);Other abnormalities of gait and mobility (R26.89);Muscle weakness (generalized) (M62.81);Pain Pain - Right/Left: Right Pain - part of body: Leg  Time: 1610-9604 OT Time Calculation (min): 17 min Charges:  OT General Charges $OT Visit: 1 Visit OT Evaluation $OT Eval Moderate Complexity: 1 Mod  Emberly Tomasso MSOT, OTR/L Acute Rehab Pager: [phone] Office: [phone]  Theodoro Grist Janiyha Montufar 08/04/2018, 8:46 AM

## 2018-08-04 NOTE — Plan of Care (Signed)

## 2018-08-04 NOTE — Progress Notes (Signed)
[facility]                                                                              Patient Demographics  Kristin Grant, is a 77 y.o. female, DOB - 05-22-1941, JXB:[id number]  Admit date - 08/02/2018   Admitting Physician Hillary BowJared M Gardner, DO  Outpatient Primary MD for the patient is [facility], Velna HatchetKawanta F, MD  Outpatient specialists:   LOS - 1  days   Medical records reviewed and are as summarized below:    Chief Complaint  Patient presents with  . Fall       Brief summary   Patient is a 77 year old female with history of Alzheimer's disease, CAD, diabetes, hypertension, GERD, prior CVA presented from skilled nursing facility after a mechanical fall with severe pain to right hip after the fall. Right hip x-ray showed mildly displaced intertrochanteric fracture of the right femoral neck  Assessment & Plan    Principal Problem:   Closed intertrochanteric fracture of hip, right, initial encounter (HCC) -Status post mechanical fall, orthopedics consulted -Status post right enteric nail with interlock for right comminuted closed intertrochanteric hip fracture postop day #1 -Start PT evaluation, will go back to skilled nursing facility once stable -Pain control, DVT prophylaxis per orthopedics  Active Problems: Fevers with leukocytosis Overnight had temp 101.2 F, no respiratory symptoms, obtain UA and culture, follow blood cultures. Chest x-ray on admission showed no pneumonia  Acute blood loss anemia -Postop, with some hemodilution component -Hemoglobin 12.9 at the time of admission, now 8.1 -Hold off on blood transfusion until fever is down, will likely need 1 unit    Dementia (HCC) -Currently stable, continue Remeron after surgery    Hypertension -Currently stable, continue propranolol and lisinopril  Prior history of CVA -Restarted aspirin and Plavix    Hypothyroidism -Continue Synthroid    Diabetes mellitus, type II (HCC), uncontrolled with  hyperglycemia, history of CVA -Hemoglobin A1c 6.7 on 10/10 -Continue Lantus, placed on sliding scale insulin sensitive    Coronary artery disease -Currently no active cardiac symptoms, no chest pain or shortness of breath, continue aspirin and Plavix    Code Status: Full CODE STATUS DVT Prophylaxis:   SCD's, will start prophylactic DVT prophylaxis once cleared by orthopedics Family Communication: Discussed in detail with the patient, all imaging results, lab results explained to the patient and daughter at the bedside   Disposition Plan: Will likely need 1 or 2 days of PT then discharged back to skilled nursing facility  Time Spent in minutes   25 minutes  Procedures:   08/03/2018 right trochanteric nail with interlock for right comminuted closed intertrochanteric hip fracture.   Consultants:   Orthopedics  Antimicrobials:      Medications  Scheduled Meds: . aspirin EC  325 mg Oral Q breakfast  . docusate sodium  100 mg Oral BID  . feeding supplement (ENSURE ENLIVE)  237 mL Oral Q breakfast  . insulin aspart  0-9 Units Subcutaneous Q4H  . insulin glargine  10 Units Subcutaneous QHS  . levothyroxine  50 mcg Oral Daily  . lisinopril  40 mg Oral Daily  . mirtazapine  15  mg Oral QHS  . multivitamin with minerals  1 tablet Oral Daily  . pantoprazole  40 mg Oral Daily  . polyethylene glycol  17 g Oral Daily  . propranolol  40 mg Oral BID   Continuous Infusions: . sodium chloride 75 mL/hr at 08/04/18 0804  . lactated ringers 10 mL/hr at 08/03/18 1448  . sodium chloride     PRN Meds:.acetaminophen, HYDROcodone-acetaminophen, menthol-cetylpyridinium **OR** phenol, methocarbamol, metoCLOPramide **OR** metoCLOPramide (REGLAN) injection, morphine injection, ondansetron **OR** ondansetron (ZOFRAN) IV   Antibiotics   Anti-infectives (From admission, onward)   Start     Dose/Rate Route Frequency Ordered Stop   08/04/18 0600  ceFAZolin (ANCEF) IVPB 2g/100 mL premix     2  g 200 mL/hr over 30 Minutes Intravenous On call to O.R. 08/03/18 1440 08/03/18 1616   08/03/18 2200  ceFAZolin (ANCEF) IVPB 1 g/50 mL premix     1 g 100 mL/hr over 30 Minutes Intravenous Every 8 hours 08/03/18 1848 08/04/18 0643   08/03/18 1441  ceFAZolin (ANCEF) 2-4 GM/100ML-% IVPB    Note to Pharmacy:  Sabino Niemann   : cabinet override      08/03/18 1441 08/03/18 1616        Subjective:   [name] was seen and examined today.  Sitting up in the chair, pain in the right hip better controlled.  Overnight spiking fevers, 101.2 F.  No coughing or respiratory symptoms.  Patient denies dizziness, chest pain, shortness of breath, abdominal pain, N/V Objective:   Vitals:   08/04/18 0450 08/04/18 0733 08/04/18 0919 08/04/18 0950  BP: (!) 118/45  110/62   Pulse: (!) 108  98   Resp: 16     Temp: (!) 101.2 F (38.4 C) 100 F (37.8 C)  99.5 F (37.5 C)  TempSrc: Oral Oral    SpO2: 99%     Weight:      Height:        Intake/Output Summary (Last 24 hours) at 08/04/2018 1126 Last data filed at 08/04/2018 0800 Gross per 24 hour  Intake 2664.08 ml  Output 300 ml  Net 2364.08 ml     Wt Readings from Last 3 Encounters:  08/03/18 62.6 kg  06/04/18 60.8 kg  05/20/18 60 kg   Physical Exam  General: Alert and oriented x 3, NAD  Eyes:   HEENT:   Cardiovascular: S1 S2 auscultated, Regular rate and rhythm. No pedal edema b/l  Respiratory: Fairly clear to auscultation bilaterally  Gastrointestinal: Soft, nontender, nondistended, + bowel sounds  Ext: no pedal edema bilaterally  Neuro: No new deficits  Musculoskeletal: No digital cyanosis, clubbing  Skin: No rashes  Psych: Normal affect and demeanor, alert and oriented x3    Data Reviewed:  I have personally reviewed following labs and imaging studies  Micro Results Recent Results (from the past 240 hour(s))  MRSA PCR Screening     Status: None   Collection Time: 08/03/18  8:00 AM  Result Value Ref Range  Status   MRSA by PCR NEGATIVE NEGATIVE Final    Comment:        The GeneXpert MRSA Assay (FDA approved for NASAL specimens only), is one component of a comprehensive MRSA colonization surveillance program. It is not intended to diagnose MRSA infection nor to guide or monitor treatment for MRSA infections. Performed at [facility], [address], [place], [place] [address]     Radiology Reports Dg Chest 1 View  Result Date: 08/03/2018 CLINICAL DATA:  Seven  12-year-old female with fall and right hip pain. EXAM: CHEST  1 VIEW COMPARISON:  Chest radiograph dated 01/17/2017 FINDINGS: Mild chronic interstitial coarsening. No focal consolidation, pleural effusion, or pneumothorax. Minimal left lung base atelectasis/scarring. The cardiac silhouette is within normal limits. Coronary vascular calcifications. No acute osseous pathology. IMPRESSION: No active disease. Electronically Signed   By: Elgie Collard M.D.   On: 08/03/2018 01:26   Dg C-arm 1-60 Min  Result Date: 08/03/2018 CLINICAL DATA:  Intraoperative imaging for fixation of a right hip fracture which the patient suffered a fall last night. Initial encounter. EXAM: OPERATIVE RIGHT HIP (WITH PELVIS IF PERFORMED) to VIEWS TECHNIQUE: Fluoroscopic spot image(s) were submitted for interpretation post-operatively. COMPARISON:  Plain films right hip 08/03/2018. FINDINGS: Hip screw and short intramedullary nail with a single distal screw are in place for fixation of an intertrochanteric fracture. Position and alignment are anatomic. No acute abnormality. IMPRESSION: Intraoperative imaging for fixation of a right intertrochanteric fracture. Electronically Signed   By: Drusilla Kanner M.D.   On: 08/03/2018 17:17   Dg Hip Operative Unilat W Or W/o Pelvis Right  Result Date: 08/03/2018 CLINICAL DATA:  Intraoperative imaging for fixation of a right hip fracture which the patient suffered a fall last night. Initial encounter. EXAM: OPERATIVE  RIGHT HIP (WITH PELVIS IF PERFORMED) to VIEWS TECHNIQUE: Fluoroscopic spot image(s) were submitted for interpretation post-operatively. COMPARISON:  Plain films right hip 08/03/2018. FINDINGS: Hip screw and short intramedullary nail with a single distal screw are in place for fixation of an intertrochanteric fracture. Position and alignment are anatomic. No acute abnormality. IMPRESSION: Intraoperative imaging for fixation of a right intertrochanteric fracture. Electronically Signed   By: Drusilla Kanner M.D.   On: 08/03/2018 17:17   Dg Hip Unilat W Or Wo Pelvis 2-3 Views Right  Result Date: 08/03/2018 CLINICAL DATA:  77 year old female with fall and right hip pain. EXAM: DG HIP (WITH OR WITHOUT PELVIS) 2-3V RIGHT COMPARISON:  None. FINDINGS: There is mildly displaced intertrochanteric fracture of the right femoral neck with mild varus angulation. There is advanced osteopenia. No dislocation. Vascular calcifications noted. The soft tissues are unremarkable. IMPRESSION: Mildly displaced intertrochanteric fracture of the right femoral neck. Electronically Signed   By: Elgie Collard M.D.   On: 08/03/2018 01:25    Lab Data:  CBC: Recent Labs  Lab 08/03/18 0146 08/04/18 0305  WBC 15.1* 11.0*  NEUTROABS 12.9*  --   HGB 12.9 8.1*  HCT 43.5 27.3*  MCV 86.1 85.3  PLT 149* 129*   Basic Metabolic Panel: Recent Labs  Lab 08/03/18 0146 08/04/18 0305  NA 141 143  K 3.8 4.0  CL 107 110  CO2 28 24  GLUCOSE 163* 169*  BUN 14 19  CREATININE 0.81 1.03*  CALCIUM 8.7* 7.6*   GFR: Estimated Creatinine Clearance: 36.6 mL/min (A) (by C-G formula based on SCr of 1.03 mg/dL (H)). Liver Function Tests: Recent Labs  Lab 08/03/18 0146  AST 20  ALT 19  ALKPHOS 50  BILITOT 0.6  PROT 6.4*  ALBUMIN 3.6   No results for input(s): LIPASE, AMYLASE in the last 168 hours. No results for input(s): AMMONIA in the last 168 hours. Coagulation Profile: Recent Labs  Lab 08/03/18 0146  INR 1.12    Cardiac Enzymes: No results for input(s): CKTOTAL, CKMB, CKMBINDEX, TROPONINI in the last 168 hours. BNP (last 3 results) No results for input(s): PROBNP in the last 8760 hours. HbA1C: No results for input(s): HGBA1C in the last 72 hours. CBG: Recent  Labs  Lab 08/03/18 2038 08/03/18 2323 08/04/18 0517 08/04/18 0641 08/04/18 0739  GLUCAP 158* 166* 171* 142* 144*   Lipid Profile: No results for input(s): CHOL, HDL, LDLCALC, TRIG, CHOLHDL, LDLDIRECT in the last 72 hours. Thyroid Function Tests: No results for input(s): TSH, T4TOTAL, FREET4, T3FREE, THYROIDAB in the last 72 hours. Anemia Panel: No results for input(s): VITAMINB12, FOLATE, FERRITIN, TIBC, IRON, RETICCTPCT in the last 72 hours. Urine analysis:    Component Value Date/Time   COLORURINE YELLOW 11/14/2017 2237   APPEARANCEUR CLEAR 11/14/2017 2237   LABSPEC 1.018 11/14/2017 2237   PHURINE 6.0 11/14/2017 2237   GLUCOSEU 50 (A) 11/14/2017 2237   HGBUR NEGATIVE 11/14/2017 2237   BILIRUBINUR NEGATIVE 11/14/2017 2237   KETONESUR NEGATIVE 11/14/2017 2237   PROTEINUR NEGATIVE 11/14/2017 2237   UROBILINOGEN 0.2 10/11/2013 0634   NITRITE NEGATIVE 11/14/2017 2237   LEUKOCYTESUR MODERATE (A) 11/14/2017 2237     Marlei Glomski M.D. [facility] 08/04/2018, 11:26 AM  Pager: [phone] Between 7am to 7pm - call Pager - [phone]  After 7pm go to [email or web address] - password [email or web address]  Call night coverage person covering after 7pm

## 2018-08-04 NOTE — Progress Notes (Signed)
Patient has not voided since I&O this am. Bladder scan shows <170cc of urine. Patient encouraged to increase po fluids. Daughter at bedside. IV fluids infusing per order.

## 2018-08-04 NOTE — Plan of Care (Signed)
  Problem: Education: Goal: Knowledge of General Education information will improve Description: Including pain rating scale, medication(s)/side effects and non-pharmacologic comfort measures Outcome: Progressing   Problem: Clinical Measurements: Goal: Ability to maintain clinical measurements within normal limits will improve Outcome: Progressing   Problem: Activity: Goal: Risk for activity intolerance will decrease Outcome: Progressing   Problem: Elimination: Goal: Will not experience complications related to bowel motility Outcome: Progressing   Problem: Pain Managment: Goal: General experience of comfort will improve Outcome: Progressing   Problem: Safety: Goal: Ability to remain free from injury will improve Outcome: Progressing   Problem: Skin Integrity: Goal: Risk for impaired skin integrity will decrease Outcome: Progressing   

## 2018-08-04 NOTE — Clinical Social Work Note (Signed)
Clinical Social Work Assessment  Patient Details  Name: Kristin Grant MRN: [id number] Date of Birth: 30-May-1941  Date of referral:  08/04/18               Reason for consult:  Discharge Planning                Permission sought to share information with:  Case Manager, Facility Medical sales representativeContact Representative, Family Supports Permission granted to share information::  Yes, Verbal Permission Granted  Name::     [name]  Agency::  SNFs  Relationship::  daughter  Contact Information:  [phone]  Housing/Transportation Living arrangements for the past 2 months:  [facility]isted [facility] [place]) Source of Information:  Patient Patient Interpreter Needed:  None Criminal Activity/Legal Involvement Pertinent to Current Situation/Hospitalization:  No - Comment as needed Significant Relationships:  Adult Children Lives with:  Self Do you feel safe going back to the place where you live?  No Need for family participation in patient care:  Yes (Comment)  Care giving concerns:  CSW received referral for possible SNF placement at time of discharge. Spoke with patient regarding possibility of SNF placement . Patient's daughter   is currently unable to care for her at their home given patient's current needs and fall risk.  Patient and  daughter  expressed understanding of PT recommendation and are agreeable to SNF placement at time of discharge. CSW to continue to follow and assist with discharge planning needs.     Social Worker assessment / plan:  Spoke with patient and daughter at bedside concerning [facility] before [facility]. [facility] does not have PT services but recommended [facility] as closest facility [place].    Employment status:  Retired Health and safety inspectornsurance information:  Medicare PT Recommendations:  Skilled Nursing Facility Information / Referral to community resources:  Skilled Nursing Facility  Patient/Family's Response to care:  Patient  and  Daughter at bedside recognize need for rehab before returning home and are agreeable to a SNF in [place]. They [facility] . CSW awaiting call back if beds avail. CSW provided medicare.gov ratings of [facility], hard copy in chart. CSW explained insurance authorization process. Patient's family reported that they want patient to get stronger to be able to come back home.    Patient/Family's Understanding of and Emotional Response to Diagnosis, Current Treatment, and Prognosis:  Patient/family is realistic regarding therapy needs and expressed being hopeful for SNF placement. Patient expressed understanding of CSW role and discharge process as well as medical condition. No questions/concerns about plan or treatment.    Emotional Assessment Appearance:  Appears stated age Attitude/Demeanor/Rapport:  Gracious Affect (typically observed):  Accepting, Adaptable Orientation:  Oriented to Self, Oriented to Place, Oriented to  Time, Oriented to Situation Alcohol / Substance use:  Not Applicable Psych involvement (Current and /or in the community):  No (Comment)  Discharge Needs  Concerns to be addressed:  Discharge Planning Concerns Readmission within the last 30 days:  No Current discharge risk:  Dependent with Mobility Barriers to Discharge:  Continued Medical Work up   Dynegyshley M Galileo Colello, LCSW 08/04/2018, 11:03 AM

## 2018-08-04 NOTE — Progress Notes (Signed)
Patient accepted to [facility] nursing facility. Can discharge there when medically ready.   MayoAshley Samariah Grant, [place] [phone]

## 2018-08-04 NOTE — Progress Notes (Signed)
   Subjective: 1 Day Post-Op Procedure(s) (LRB): INTRAMEDULLARY (IM) NAIL INTERTROCHANTRIC (Right) Patient reports pain as mild and moderate.    Objective: Vital signs in last 24 hours: Temp:  [97.2 F (36.2 C)-101.2 F (38.4 C)] (P) 100 F (37.8 C) (12/10 0733) Pulse Rate:  [63-108] 108 (12/10 0450) Resp:  [12-22] 16 (12/10 0450) BP: (105-135)/(45-92) 118/45 (12/10 0450) SpO2:  [86 %-100 %] 99 % (12/10 0450)  Intake/Output from previous day: 12/09 0701 - 12/10 0700 In: 2544.1 [I.V.:2244.1; IV Piggyback:300] Out: 300 [Blood:300] Intake/Output this shift: No intake/output data recorded.  Recent Labs    08/03/18 0146 08/04/18 0305  HGB 12.9 8.1*   Recent Labs    08/03/18 0146 08/04/18 0305  WBC 15.1* 11.0*  RBC 5.05 3.20*  HCT 43.5 27.3*  PLT 149* 129*   Recent Labs    08/03/18 0146 08/04/18 0305  NA 141 143  K 3.8 4.0  CL 107 110  CO2 28 24  BUN 14 19  CREATININE 0.81 1.03*  GLUCOSE 163* 169*  CALCIUM 8.7* 7.6*   Recent Labs    08/03/18 0146  INR 1.12    Neurologically intact Dg C-arm 1-60 Min  Result Date: 08/03/2018 CLINICAL DATA:  Intraoperative imaging for fixation of a right hip fracture which the patient suffered a fall last night. Initial encounter. EXAM: OPERATIVE RIGHT HIP (WITH PELVIS IF PERFORMED) to VIEWS TECHNIQUE: Fluoroscopic spot image(s) were submitted for interpretation post-operatively. COMPARISON:  Plain films right hip 08/03/2018. FINDINGS: Hip screw and short intramedullary nail with a single distal screw are in place for fixation of an intertrochanteric fracture. Position and alignment are anatomic. No acute abnormality. IMPRESSION: Intraoperative imaging for fixation of a right intertrochanteric fracture. Electronically Signed   By: Drusilla Kannerhomas  Dalessio M.D.   On: 08/03/2018 17:17   Dg Hip Operative Unilat W Or W/o Pelvis Right  Result Date: 08/03/2018 CLINICAL DATA:  Intraoperative imaging for fixation of a right hip fracture which  the patient suffered a fall last night. Initial encounter. EXAM: OPERATIVE RIGHT HIP (WITH PELVIS IF PERFORMED) to VIEWS TECHNIQUE: Fluoroscopic spot image(s) were submitted for interpretation post-operatively. COMPARISON:  Plain films right hip 08/03/2018. FINDINGS: Hip screw and short intramedullary nail with a single distal screw are in place for fixation of an intertrochanteric fracture. Position and alignment are anatomic. No acute abnormality. IMPRESSION: Intraoperative imaging for fixation of a right intertrochanteric fracture. Electronically Signed   By: Drusilla Kannerhomas  Dalessio M.D.   On: 08/03/2018 17:17    Assessment/Plan: 1 Day Post-Op Procedure(s) (LRB): INTRAMEDULLARY (IM) NAIL INTERTROCHANTRIC (Right) Up with therapy, plan back to SNF after a few days.   Kristin Grant 08/04/2018, 8:25 AM

## 2018-08-04 NOTE — [place]2 (Signed)
[place] MEDICAID FL2 LEVEL OF CARE SCREENING TOOL     IDENTIFICATION  Patient Name: Kristin Grant Birthdate: 06-29-1941 Sex: female Admission Date (Current Location): 08/02/2018  [facility] [place]   Facility and Address:  [facility] [place], [address], [place], [place] [address]      Provider Number: [id number]  Attending Physician Name and Address:  Cathren Harshai, Ripudeep K, MD  Relative Name and Phone Number:       Current Level of Care: Hospital Recommended Level of Care: Skilled Nursing Facility Prior Approval Number:    Date Approved/Denied:   PASRR Number:   [id number]   Discharge Plan:      Current Diagnoses: Patient Active Problem List   Diagnosis Date Noted  . Closed intertrochanteric fracture of hip, right, initial encounter (HCC) 08/03/2018  . Malnutrition of moderate degree 08/03/2018  . Coronary artery disease 11/17/2017  . Thrombocytopenia (HCC) 11/17/2017  . Right arm fracture 11/17/2017  . Cerebral atrophy (HCC) 11/17/2017  . Hypokalemia 11/17/2017  . History of CVA (cerebrovascular accident) 11/15/2017  . Suspected stroke patient last known to be well 3 to 4.5 hours ago   . Closed fracture of proximal end of right humerus 09/18/17 10/16/2017  . Osteoporosis 01/09/2017  . Loss of weight 01/12/2016  . Stroke-like episode (HCC) s/p IV tPA 11/22/2015  . Diabetes mellitus, type II (HCC) 06/22/2014  . Protein-calorie malnutrition (HCC) 12/15/2013  . Fall 08/17/2013  . Back pain 08/17/2013  . Pressure ulcer, stage 1 07/06/2013  . Abnormal development of nail 03/28/2013  . Gait abnormality 12/10/2011  . Dementia (HCC) 11/21/2011  . Hypertension 11/21/2011  . Hypothyroidism 11/21/2011    Orientation RESPIRATION BLADDER Height & Weight     Self, Situation  O2(2L) Incontinent Weight: 138 lb (62.6 kg) Height:  5\' 3"  (160 cm)  BEHAVIORAL SYMPTOMS/MOOD NEUROLOGICAL BOWEL NUTRITION STATUS        Diet(Carb modified )   AMBULATORY STATUS COMMUNICATION OF NEEDS Skin   Limited Assist Verbally Other (Comment)(closed incision- right hip)                       Personal Care Assistance Level of Assistance  Bathing, Dressing, Feeding Bathing Assistance: Limited assistance Feeding assistance: Independent Dressing Assistance: Limited assistance     Functional Limitations Info             SPECIAL CARE FACTORS FREQUENCY  PT (By licensed PT), OT (By licensed OT)     PT Frequency: 5 OT Frequency: 5            Contractures      Additional Factors Info  Code Status, Allergies Code Status Info: Full code  Allergies Info: NKA            Current Medications (08/04/2018):  This is the current hospital active medication list Current Facility-Administered Medications  Medication Dose Route Frequency Provider Last Rate Last Dose  . 0.9 %  sodium chloride infusion   Intravenous Continuous Rai, Ripudeep K, MD 75 mL/hr at 08/04/18 0804    . acetaminophen (TYLENOL) tablet 650 mg  650 mg Oral Q6H PRN Schorr, Roma KayserKatherine P, NP   650 mg at 08/04/18 0603  . aspirin EC tablet 325 mg  325 mg Oral Q breakfast Naida SleightOwens, James M, PA-C   325 mg at 08/04/18 0920  . docusate sodium (COLACE) capsule 100 mg  100 mg Oral BID Naida SleightOwens, James M, PA-C   100 mg at 08/04/18  0920  . feeding supplement (ENSURE ENLIVE) (ENSURE ENLIVE) liquid 237 mL  237 mL Oral Q breakfast Rai, Ripudeep K, MD   237 mL at 08/04/18 0921  . HYDROcodone-acetaminophen (NORCO/VICODIN) 5-325 MG per tablet 1-2 tablet  1-2 tablet Oral Q6H PRN Hillary Bow, DO   1 tablet at 08/04/18 0104  . insulin aspart (novoLOG) injection 0-9 Units  0-9 Units Subcutaneous Q4H Hillary Bow, DO   2 Units at 08/04/18 1610  . insulin glargine (LANTUS) injection 10 Units  10 Units Subcutaneous QHS Hillary Bow, DO   10 Units at 08/03/18 2111  . lactated ringers infusion   Intravenous Continuous Dorris Singh, MD 10 mL/hr at 08/03/18 1448    . levothyroxine  (SYNTHROID, LEVOTHROID) tablet 50 mcg  50 mcg Oral Daily Lyda Perone M, DO   50 mcg at 08/04/18 9604  . lisinopril (PRINIVIL,ZESTRIL) tablet 40 mg  40 mg Oral Daily Lyda Perone M, DO   40 mg at 08/04/18 0919  . menthol-cetylpyridinium (CEPACOL) lozenge 3 mg  1 lozenge Oral PRN Naida Sleight, PA-C       Or  . phenol (CHLORASEPTIC) mouth spray 1 spray  1 spray Mouth/Throat PRN Naida Sleight, PA-C      . methocarbamol (ROBAXIN) tablet 500 mg  500 mg Oral BID PRN Hillary Bow, DO   500 mg at 08/03/18 1002  . metoCLOPramide (REGLAN) tablet 5-10 mg  5-10 mg Oral Q8H PRN Naida Sleight, PA-C       Or  . metoCLOPramide (REGLAN) injection 5-10 mg  5-10 mg Intravenous Q8H PRN Zonia Kief M, PA-C      . mirtazapine (REMERON) tablet 15 mg  15 mg Oral QHS Lyda Perone M, DO   15 mg at 08/03/18 2126  . morphine 2 MG/ML injection 1 mg  1 mg Intravenous Q2H PRN Rai, Ripudeep K, MD   1 mg at 08/03/18 1127  . multivitamin with minerals tablet 1 tablet  1 tablet Oral Daily Rai, Ripudeep K, MD   1 tablet at 08/04/18 0919  . ondansetron (ZOFRAN) tablet 4 mg  4 mg Oral Q6H PRN Naida Sleight, PA-C       Or  . ondansetron [facility]) injection 4 mg  4 mg Intravenous Q6H PRN Zonia Kief M, PA-C      . pantoprazole (PROTONIX) EC tablet 40 mg  40 mg Oral Daily Lyda Perone M, DO   40 mg at 08/04/18 0919  . polyethylene glycol (MIRALAX / GLYCOLAX) packet 17 g  17 g Oral Daily Rai, Ripudeep K, MD   17 g at 08/04/18 1002  . propranolol (INDERAL) tablet 40 mg  40 mg Oral BID Lyda Perone M, DO   40 mg at 08/04/18 0919  . sodium chloride 0.9 % bolus 500 mL  500 mL Intravenous Once Schorr, Roma Kayser, NP         Discharge Medications: Please see discharge summary for a list of discharge medications.  Relevant Imaging Results:  Relevant Lab Results:   Additional Information SS#: [id number]  Donnie Coffin, LCSW

## 2018-08-04 NOTE — Plan of Care (Signed)

## 2018-08-04 NOTE — Evaluation (Signed)
Physical Therapy Evaluation Patient Details Name: Kristin Grant MRN: [id number] DOB: [birth year] Today's Date: 08/04/2018   History of Present Illness  77 y.o. female with medical history significant of Alzheimer's disease, CAD, stroke, HTN, DM2. Presenting post mechanical fall in bathroom sustained R hip intertrochanteric fx. S/p IM nail placement on 12/9.  Clinical Impression  Pt admitted with above. Due to patients poor memory and dementia pt unable to comply with R LE PWB at 50%. Recommend pt use w/c as primary mode of mobility initially until she is cleared for WBAT on R LE. Pt required mod/maxA for all mobiltiy. Pt will need ST-SNF upon d/c. Acute PT to cont to follow.    Follow Up Recommendations SNF(return to SNF - [facility] if they have skilled therapy)    Equipment Recommendations  None recommended by PT    Recommendations for Other Services       Precautions / Restrictions Precautions Precautions: Fall Restrictions Weight Bearing Restrictions: Yes RLE Weight Bearing: Partial weight bearing RLE Partial Weight Bearing Percentage or Pounds: 50      Mobility  Bed Mobility Overal bed mobility: Needs Assistance Bed Mobility: Supine to Sit     Supine to sit: Max assist;+2 for physical assistance;HOB elevated     General bed mobility comments: Max A to manage BLEs and elevate trunk. use of pad to facilitate hips towards EOB.   Transfers Overall transfer level: Needs assistance Equipment used: Rolling walker (2 wheeled) Transfers: Sit to/from [employer] Sit to Stand: Max assist;+2 physical assistance Stand pivot transfers: Max assist;+2 physical assistance       General transfer comment: Max A+2 to power up into standing. Requiring Max A +2 to maintain standing balance and pivot towards recliner placed on left side. Poor adherance to WB status  Ambulation/Gait                Stairs            Wheelchair Mobility    Modified  Rankin (Stroke Patients Only)       Balance Overall balance assessment: Needs assistance;History of Falls Sitting-balance support: No upper extremity supported;Feet supported Sitting balance-Leahy Scale: Poor     Standing balance support: Bilateral upper extremity supported;During functional activity Standing balance-Leahy Scale: Poor Standing balance comment: Reliant on UE support and physical A                             Pertinent Vitals/Pain Pain Assessment: Faces Faces Pain Scale: Hurts little more Pain Location: RLE Pain Descriptors / Indicators: Discomfort;Grimacing;Guarding Pain Intervention(s): Monitored during session;Limited activity within patient's tolerance;Repositioned    Home Living Family/patient expects to be discharged to:: Skilled nursing facility                 Additional Comments: [facility]    Prior Function Level of Independence: Needs assistance   Gait / Transfers Assistance Needed: uses RW  ADL's / Homemaking Assistance Needed: assist for bathing, indep for dressing        Hand Dominance   Dominant Hand: Right    Extremity/Trunk Assessment   Upper Extremity Assessment Upper Extremity Assessment: Generalized weakness    Lower Extremity Assessment Lower Extremity Assessment: RLE deficits/detail RLE Deficits / Details: pt with minimal active mvment due to pain    Cervical / Trunk Assessment Cervical / Trunk Assessment: Kyphotic  Communication   Communication: No difficulties  Cognition Arousal/Alertness: Awake/alert Behavior During Therapy: Flat affect Overall  Cognitive Status: History of cognitive impairments - at baseline                                 General Comments: h/o dementia, very poor short term memory      General Comments General comments (skin integrity, edema, etc.): pt with dressing intact    Exercises     Assessment/Plan    PT Assessment Patient needs continued PT services   PT Problem List Decreased strength;Decreased range of motion;Decreased activity tolerance;Decreased balance;Decreased mobility;Decreased coordination;Decreased cognition;Decreased knowledge of use of DME;Decreased safety awareness       PT Treatment Interventions DME instruction;Stair training;Gait training;Functional mobility training;Therapeutic activities;Therapeutic exercise;Balance training;Cognitive remediation    PT Goals (Current goals can be found in the Care Plan section)  Acute Rehab PT Goals Patient Stated Goal: didn't state PT Goal Formulation: With patient/family Time For Goal Achievement: 08/18/18 Potential to Achieve Goals: Good Additional Goals Additional Goal #1: Pt supervision with w/c propulsion x 100'.    Frequency Min 3X/week   Barriers to discharge        Co-evaluation PT/OT/SLP Co-Evaluation/Treatment: Yes Reason for Co-Treatment: Complexity of the patient's impairments (multi-system involvement) PT goals addressed during session: Mobility/safety with mobility         AM-PAC PT "6 Clicks" Mobility  Outcome Measure Help needed turning from your back to your side while in a flat bed without using bedrails?: A Lot Help needed moving from lying on your back to sitting on the side of a flat bed without using bedrails?: A Lot Help needed moving to and from a bed to a chair (including a wheelchair)?: A Lot Help needed standing up from a chair using your arms (e.g., wheelchair or bedside chair)?: A Lot Help needed to walk in hospital room?: A Lot Help needed climbing 3-5 steps with a railing? : Total 6 Click Score: 11    End of Session Equipment Utilized During Treatment: Gait belt Activity Tolerance: Patient tolerated treatment well Patient left: in chair;with call bell/phone within reach;with nursing/sitter in room;with chair alarm set Nurse Communication: Mobility status PT Visit Diagnosis: Difficulty in walking, not elsewhere classified  (R26.2);Pain Pain - Right/Left: Right Pain - part of body: Hip    Time: [id number] PT Time Calculation (min) (ACUTE ONLY): 18 min   Charges:   PT Evaluation $PT Eval Moderate Complexity: 1 Mod          Lewis ShockAshly Cheyne Bungert, PT, DPT Acute Rehabilitation Services Pager #: [phone] Office #: [phone]   Iona Hansenshly M [place]olfo 08/04/2018, 11:12 AM

## 2018-08-04 NOTE — Anesthesia Postprocedure Evaluation (Signed)
Anesthesia Post Note  Patient: Kristin Grant  Procedure(s) Performed: INTRAMEDULLARY (IM) NAIL INTERTROCHANTRIC (Right Hip)     Patient location during evaluation: PACU Anesthesia Type: General Level of consciousness: awake and alert Pain management: pain level controlled Vital Signs Assessment: post-procedure vital signs reviewed and stable Respiratory status: spontaneous breathing, nonlabored ventilation, respiratory function stable and patient connected to nasal cannula oxygen Cardiovascular status: blood pressure returned to baseline and stable Postop Assessment: no apparent nausea or vomiting Anesthetic complications: no    Last Vitals:  Vitals:   08/04/18 0919 08/04/18 0950  BP: 110/62   Pulse: 98   Resp:    Temp:  37.5 C  SpO2:      Last Pain:  Vitals:   08/04/18 0733  TempSrc: Oral  PainSc:    Pain Goal:                 Kristin Grant

## 2018-08-05 DIAGNOSIS — D62 Acute posthemorrhagic anemia: Secondary | ICD-10-CM | POA: Diagnosis not present

## 2018-08-05 LAB — URINE CULTURE: Culture: NO GROWTH

## 2018-08-05 LAB — CBC
HCT: 20.6 % — ABNORMAL LOW (ref 36.0–46.0)
Hemoglobin: 6.2 g/dL — CL (ref 12.0–15.0)
MCH: 25.9 pg — ABNORMAL LOW (ref 26.0–34.0)
MCHC: 30.1 g/dL (ref 30.0–36.0)
MCV: 86.2 fL (ref 80.0–100.0)
Platelets: 91 10*3/uL — ABNORMAL LOW (ref 150–400)
RBC: 2.39 MIL/uL — ABNORMAL LOW (ref 3.87–5.11)
RDW: 15.1 % (ref 11.5–15.5)
WBC: 7.3 10*3/uL (ref 4.0–10.5)
nRBC: 0.3 % — ABNORMAL HIGH (ref 0.0–0.2)

## 2018-08-05 LAB — BASIC METABOLIC PANEL
Anion gap: 6 (ref 5–15)
BUN: 24 mg/dL — ABNORMAL HIGH (ref 8–23)
CO2: 26 mmol/L (ref 22–32)
Calcium: 7.6 mg/dL — ABNORMAL LOW (ref 8.9–10.3)
Chloride: 110 mmol/L (ref 98–111)
Creatinine, Ser: 0.97 mg/dL (ref 0.44–1.00)
GFR calc Af Amer: >60 mL/min (ref >60)
GFR calc non Af Amer: 56 mL/min — ABNORMAL LOW (ref >60)
Glucose, Bld: 214 mg/dL — ABNORMAL HIGH (ref 70–99)
Potassium: 3.6 mmol/L (ref 3.5–5.1)
Sodium: 142 mmol/L (ref 135–145)

## 2018-08-05 LAB — GLUCOSE, CAPILLARY
GLUCOSE-CAPILLARY: 263 mg/dL — AB (ref 70–99)
GLUCOSE-CAPILLARY: 141 mg/dL — AB (ref 70–99)
Glucose-Capillary: 140 mg/dL — ABNORMAL HIGH (ref 70–99)
Glucose-Capillary: 208 mg/dL — ABNORMAL HIGH (ref 70–99)

## 2018-08-05 LAB — ABO/RH: ABO/RH(D): O POS

## 2018-08-05 LAB — PREPARE RBC (CROSSMATCH)

## 2018-08-05 MED ORDER — SODIUM CHLORIDE 0.9% IV SOLUTION
Freq: Once | INTRAVENOUS | Status: AC
  Administered 2018-08-05: 09:00:00 via INTRAVENOUS

## 2018-08-05 MED ORDER — ACETAMINOPHEN 500 MG PO TABS
1000.0000 mg | ORAL_TABLET | Freq: Three times a day (TID) | ORAL | Status: DC
  Administered 2018-08-05 – 2018-08-06 (×4): 1000 mg via ORAL
  Filled 2018-08-05 (×3): qty 2

## 2018-08-05 MED ORDER — SODIUM CHLORIDE 0.9 % IV SOLN
1.0000 g | INTRAVENOUS | Status: DC
  Administered 2018-08-05 – 2018-08-06 (×2): 1 g via INTRAVENOUS
  Filled 2018-08-05 (×3): qty 10

## 2018-08-05 NOTE — Progress Notes (Signed)
[facility]                                                                              Patient Demographics  Kristin Grant, is a 77 y.o. female, DOB - 18-Apr-1941, ZOX:[id number]  Admit date - 08/02/2018   Admitting Physician Hillary Bow, DO  Outpatient Primary MD for the patient is [facility], Velna Hatchet, MD  Outpatient specialists:   LOS - 2  days   Medical records reviewed and are as summarized below:    Chief Complaint  Patient presents with  . Fall       Brief summary   Patient is a 77 year old female with history of Alzheimer's disease, CAD, diabetes, hypertension, GERD, prior CVA presented from skilled nursing facility after a mechanical fall with severe pain to right hip after the fall. Right hip x-ray showed mildly displaced intertrochanteric fracture of the right femoral neck  Assessment & Plan    Principal Problem:   Closed intertrochanteric fracture of hip, right, initial encounter (HCC) -Status post mechanical fall, orthopedics consulted -Status post right enteric nail with interlock for right comminuted closed intertrochanteric hip fracture postop day #1 -Start PT evaluation, will go back to skilled nursing facility once stable -Pain control, DVT prophylaxis per orthopedics  Active Problems: Acute metabolic encephalopathy likely due to UTI, superimposed on dementia -Follow urine cultures, continue IV Rocephin -Chest x-ray on admission showed no pneumonia -Discontinued opioids, Robaxin.  Placed on scheduled Tylenol for 3 days  Acute blood loss anemia -Postop, with some hemodilution component -Hemoglobin 6.2 today, transfused 2 units packed RBCs.     Hypertension -Currently stable, continue propranolol and lisinopril  Prior history of CVA -Restarted aspirin and Plavix    Hypothyroidism -Continue Synthroid    Diabetes mellitus, type II (HCC), uncontrolled with hyperglycemia, history of CVA -Hemoglobin A1c 6.7 on 10/10 -Continue  Lantus, placed on sliding scale insulin sensitive    Coronary artery disease -Currently no active cardiac symptoms, no chest pain or shortness of breath, continue aspirin and Plavix    Code Status: Full CODE STATUS DVT Prophylaxis:   SCD's, will start prophylactic DVT prophylaxis once cleared by orthopedics Family Communication: Discussed in detail with the patient, all imaging results, lab results explained to the patient and daughter at the bedside on 12/10   Disposition Plan: Patient will need to stay inpatient for packed RBC transfusion, currently acute encephalopathy.  Time Spent in minutes   25 minutes  Procedures:   08/03/2018 right trochanteric nail with interlock for right comminuted closed intertrochanteric hip fracture.   Consultants:   Orthopedics  Antimicrobials:      Medications  Scheduled Meds: . aspirin EC  325 mg Oral Q breakfast  . clopidogrel  75 mg Oral q morning - 10a  . docusate sodium  100 mg Oral BID  . feeding supplement (ENSURE ENLIVE)  237 mL Oral Q breakfast  . insulin aspart  0-5 Units Subcutaneous QHS  . insulin aspart  0-9 Units Subcutaneous TID WC  . insulin glargine  10 Units Subcutaneous QHS  . levothyroxine  50 mcg Oral Daily  . lisinopril  40 mg Oral Daily  .  mirtazapine  15 mg Oral QHS  . multivitamin with minerals  1 tablet Oral Daily  . pantoprazole  40 mg Oral Daily  . polyethylene glycol  17 g Oral Daily  . propranolol  40 mg Oral BID   Continuous Infusions: . sodium chloride 75 mL/hr at 08/04/18 0804  . cefTRIAXone (ROCEPHIN)  IV 1 g (08/05/18 0653)  . lactated ringers 10 mL/hr at 08/03/18 1448  . sodium chloride     PRN Meds:.acetaminophen, HYDROcodone-acetaminophen, menthol-cetylpyridinium **OR** phenol, methocarbamol, metoCLOPramide **OR** metoCLOPramide (REGLAN) injection, morphine injection, ondansetron **OR** ondansetron (ZOFRAN) IV   Antibiotics   Anti-infectives (From admission, onward)   Start     Dose/Rate  Route Frequency Ordered Stop   08/05/18 0630  cefTRIAXone (ROCEPHIN) 1 g in sodium chloride 0.9 % 100 mL IVPB     1 g 200 mL/hr over 30 Minutes Intravenous Every 24 hours 08/05/18 0620     08/04/18 0600  ceFAZolin (ANCEF) IVPB 2g/100 mL premix     2 g 200 mL/hr over 30 Minutes Intravenous On call to O.R. 08/03/18 1440 08/03/18 1616   08/03/18 2200  ceFAZolin (ANCEF) IVPB 1 g/50 mL premix     1 g 100 mL/hr over 30 Minutes Intravenous Every 8 hours 08/03/18 1848 08/04/18 0643   08/03/18 1441  ceFAZolin (ANCEF) 2-4 GM/100ML-% IVPB    Note to Pharmacy:  Sabino Niemann   : cabinet override      08/03/18 1441 08/03/18 1616        Subjective:   Mry Lucado was seen and examined today.  Confused, somewhat agitated.  Oriented to self. Spiking temp, 101.1 F.   Objective:   Vitals:   08/05/18 0404 08/05/18 0824 08/05/18 0909 08/05/18 1200  BP: (!) 118/48 (!) 126/55 125/77 (!) 129/47  Pulse: (!) 101 (!) 126 (!) 108 (!) 104  Resp: 16 18 16 17   Temp: 98 F (36.7 C) 100.3 F (37.9 C) 98.9 F (37.2 C) (!) 101.1 F (38.4 C)  TempSrc: Oral Axillary Oral Oral  SpO2: 93% 96% 96% 95%  Weight:      Height:        Intake/Output Summary (Last 24 hours) at 08/05/2018 1354 Last data filed at 08/05/2018 0800 Gross per 24 hour  Intake 240 ml  Output 500 ml  Net -260 ml     Wt Readings from Last 3 Encounters:  08/03/18 62.6 kg  06/04/18 60.8 kg  05/20/18 60 kg   Physical Exam  General: Alert and confused, somewhat agitated  Eyes:  HEENT:  Atraumatic, normocephalic  Cardiovascular: S1 S2 auscultated, sinus tachycardia, no pedal edema b/l  Respiratory: Clear to auscultation bilaterally, no wheezing, rales or rhonchi  Gastrointestinal: Soft, nontender, nondistended, + bowel sounds  Ext: no pedal edema bilaterally  Neuro: Unable to assess  Musculoskeletal: No digital cyanosis, clubbing  Skin: No rashes  Psych: Confused    Data Reviewed:  I have personally reviewed  following labs and imaging studies  Micro Results Recent Results (from the past 240 hour(s))  MRSA PCR Screening     Status: None   Collection Time: 08/03/18  8:00 AM  Result Value Ref Range Status   MRSA by PCR NEGATIVE NEGATIVE Final    Comment:        The GeneXpert MRSA Assay (FDA approved for NASAL specimens only), is one component of a comprehensive MRSA colonization surveillance program. It is not intended to diagnose MRSA infection nor to guide or monitor treatment for MRSA infections. Performed at  [facility], [address] [place], [place] [address]   Urine Culture     Status: None   Collection Time: 08/04/18 11:30 AM  Result Value Ref Range Status   Specimen Description URINE, CATHETERIZED  Final   Special Requests NONE  Final   Culture   Final    NO GROWTH Performed at [facility], [address], [place], [place] [address]    Report Status 08/05/2018 FINAL  Final    Radiology Reports Dg Chest 1 View  Result Date: 08/03/2018 CLINICAL DATA:  Seven 78-year-old female with fall and right hip pain. EXAM: CHEST  1 VIEW COMPARISON:  Chest radiograph dated 01/17/2017 FINDINGS: Mild chronic interstitial coarsening. No focal consolidation, pleural effusion, or pneumothorax. Minimal left lung base atelectasis/scarring. The cardiac silhouette is within normal limits. Coronary vascular calcifications. No acute osseous pathology. IMPRESSION: No active disease. Electronically Signed   By: Elgie Collard M.D.   On: 08/03/2018 01:26   Dg C-arm 1-60 Min  Result Date: 08/03/2018 CLINICAL DATA:  Intraoperative imaging for fixation of a right hip fracture which the patient suffered a fall last night. Initial encounter. EXAM: OPERATIVE RIGHT HIP (WITH PELVIS IF PERFORMED) to VIEWS TECHNIQUE: Fluoroscopic spot image(s) were submitted for interpretation post-operatively. COMPARISON:  Plain films right hip 08/03/2018. FINDINGS: Hip screw and short intramedullary nail with a  single distal screw are in place for fixation of an intertrochanteric fracture. Position and alignment are anatomic. No acute abnormality. IMPRESSION: Intraoperative imaging for fixation of a right intertrochanteric fracture. Electronically Signed   By: Drusilla Kanner M.D.   On: 08/03/2018 17:17   Dg Hip Operative Unilat W Or W/o Pelvis Right  Result Date: 08/03/2018 CLINICAL DATA:  Intraoperative imaging for fixation of a right hip fracture which the patient suffered a fall last night. Initial encounter. EXAM: OPERATIVE RIGHT HIP (WITH PELVIS IF PERFORMED) to VIEWS TECHNIQUE: Fluoroscopic spot image(s) were submitted for interpretation post-operatively. COMPARISON:  Plain films right hip 08/03/2018. FINDINGS: Hip screw and short intramedullary nail with a single distal screw are in place for fixation of an intertrochanteric fracture. Position and alignment are anatomic. No acute abnormality. IMPRESSION: Intraoperative imaging for fixation of a right intertrochanteric fracture. Electronically Signed   By: Drusilla Kanner M.D.   On: 08/03/2018 17:17   Dg Hip Unilat W Or Wo Pelvis 2-3 Views Right  Result Date: 08/03/2018 CLINICAL DATA:  77 year old female with fall and right hip pain. EXAM: DG HIP (WITH OR WITHOUT PELVIS) 2-3V RIGHT COMPARISON:  None. FINDINGS: There is mildly displaced intertrochanteric fracture of the right femoral neck with mild varus angulation. There is advanced osteopenia. No dislocation. Vascular calcifications noted. The soft tissues are unremarkable. IMPRESSION: Mildly displaced intertrochanteric fracture of the right femoral neck. Electronically Signed   By: Elgie Collard M.D.   On: 08/03/2018 01:25    Lab Data:  CBC: Recent Labs  Lab 08/03/18 0146 08/04/18 0305 08/05/18 0226  WBC 15.1* 11.0* 7.3  NEUTROABS 12.9*  --   --   HGB 12.9 8.1* 6.2*  HCT 43.5 27.3* 20.6*  MCV 86.1 85.3 86.2  PLT 149* 129* 91*   Basic Metabolic Panel: Recent Labs  Lab 08/03/18 0146  08/04/18 0305 08/05/18 0226  NA 141 143 142  K 3.8 4.0 3.6  CL 107 110 110  CO2 28 24 26   GLUCOSE 163* 169* 214*  BUN 14 19 24*  CREATININE 0.81 1.03* 0.97  CALCIUM 8.7* 7.6* 7.6*   GFR: Estimated Creatinine  Clearance: 38.9 mL/min (by C-G formula based on SCr of 0.97 mg/dL). Liver Function Tests: Recent Labs  Lab 08/03/18 0146  AST 20  ALT 19  ALKPHOS 50  BILITOT 0.6  PROT 6.4*  ALBUMIN 3.6   No results for input(s): LIPASE, AMYLASE in the last 168 hours. No results for input(s): AMMONIA in the last 168 hours. Coagulation Profile: Recent Labs  Lab 08/03/18 0146  INR 1.12   Cardiac Enzymes: No results for input(s): CKTOTAL, CKMB, CKMBINDEX, TROPONINI in the last 168 hours. BNP (last 3 results) No results for input(s): PROBNP in the last 8760 hours. HbA1C: Recent Labs    08/04/18 0305  HGBA1C 5.8*   CBG: Recent Labs  Lab 08/04/18 1151 08/04/18 1714 08/04/18 2201 08/05/18 0555 08/05/18 1136  GLUCAP 157* 144* 220* 140* 208*   Lipid Profile: No results for input(s): CHOL, HDL, LDLCALC, TRIG, CHOLHDL, LDLDIRECT in the last 72 hours. Thyroid Function Tests: No results for input(s): TSH, T4TOTAL, FREET4, T3FREE, THYROIDAB in the last 72 hours. Anemia Panel: No results for input(s): VITAMINB12, FOLATE, FERRITIN, TIBC, IRON, RETICCTPCT in the last 72 hours. Urine analysis:    Component Value Date/Time   COLORURINE YELLOW 08/04/2018 1147   APPEARANCEUR HAZY (A) 08/04/2018 1147   LABSPEC 1.021 08/04/2018 1147   PHURINE 5.0 08/04/2018 1147   GLUCOSEU NEGATIVE 08/04/2018 1147   HGBUR NEGATIVE 08/04/2018 1147   BILIRUBINUR NEGATIVE 08/04/2018 1147   KETONESUR NEGATIVE 08/04/2018 1147   PROTEINUR NEGATIVE 08/04/2018 1147   UROBILINOGEN 0.2 10/11/2013 0634   NITRITE NEGATIVE 08/04/2018 1147   LEUKOCYTESUR LARGE (A) 08/04/2018 1147     Rudra Hobbins M.D. [facility] 08/05/2018, 1:54 PM  Pager: [phone] Between 7am to 7pm - call Pager -  [phone]  After 7pm go to [email or web address] - password [email or web address]  Call night coverage person covering after 7pm

## 2018-08-05 NOTE — Progress Notes (Signed)
CRITICAL VALUE ALERT  Critical Value:  Hemoglobin 6.2 Date & Time Notied:  08/05/2018 4 AM  Provider Notified: [facility]  Orders Received/Actions taken: transfuse 2 units of RBC

## 2018-08-05 NOTE — Progress Notes (Addendum)
   Subjective: 2 Days Post-Op Procedure(s) (LRB): INTRAMEDULLARY (IM) NAIL INTERTROCHANTRIC (Right) Patient has been agitated. Waiting on transfusion  Objective: Vital signs in last 24 hours: Temp:  [98 F (36.7 C)-100.8 F (38.2 C)] 98 F (36.7 C) (12/11 0404) Pulse Rate:  [98-123] 101 (12/11 0404) Resp:  [14-18] 16 (12/11 0404) BP: (109-118)/(44-88) 118/48 (12/11 0404) SpO2:  [93 %-95 %] 93 % (12/11 0404)  Intake/Output from previous day: 12/10 0701 - 12/11 0700 In: 120 [P.O.:120] Out: 700 [Urine:700] Intake/Output this shift: No intake/output data recorded.  Recent Labs    08/03/18 0146 08/04/18 0305 08/05/18 0226  HGB 12.9 8.1* 6.2*   Recent Labs    08/04/18 0305 08/05/18 0226  WBC 11.0* 7.3  RBC 3.20* 2.39*  HCT 27.3* 20.6*  PLT 129* 91*   Recent Labs    08/04/18 0305 08/05/18 0226  NA 143 142  K 4.0 3.6  CL 110 110  CO2 24 26  BUN 19 24*  CREATININE 1.03* 0.97  GLUCOSE 169* 214*  CALCIUM 7.6* 7.6*   Recent Labs    08/03/18 0146  INR 1.12    Neurologically intact No results found.  Assessment/Plan: 2 Days Post-Op Procedure(s) (LRB): INTRAMEDULLARY (IM) NAIL INTERTROCHANTRIC (Right) Up with therapy, transfusion, SNF when medically stable.  Acute blood loss anemia.   Eldred MangesMark C Garrison Michie 08/05/2018, 8:06 AM

## 2018-08-05 NOTE — Plan of Care (Signed)
  Problem: Pain Managment: Goal: General experience of comfort will improve Outcome: Progressing   Problem: Safety: Goal: Ability to remain free from injury will improve Outcome: Progressing   

## 2018-08-06 ENCOUNTER — Inpatient Hospital Stay
Admission: RE | Admit: 2018-08-06 | Discharge: 2018-09-08 | Disposition: A | Discharge status: Skilled Nursing Facility | Payer: Medicare Other | Source: Ambulatory Visit | Attending: Internal Medicine | Admitting: Internal Medicine

## 2018-08-06 DIAGNOSIS — I10 Essential (primary) hypertension: Secondary | ICD-10-CM | POA: Diagnosis not present

## 2018-08-06 DIAGNOSIS — R262 Difficulty in walking, not elsewhere classified: Secondary | ICD-10-CM | POA: Diagnosis not present

## 2018-08-06 DIAGNOSIS — Z9181 History of falling: Secondary | ICD-10-CM | POA: Diagnosis not present

## 2018-08-06 DIAGNOSIS — S72001D Fracture of unspecified part of neck of right femur, subsequent encounter for closed fracture with routine healing: Secondary | ICD-10-CM | POA: Diagnosis not present

## 2018-08-06 DIAGNOSIS — I2510 Atherosclerotic heart disease of native coronary artery without angina pectoris: Secondary | ICD-10-CM | POA: Diagnosis not present

## 2018-08-06 DIAGNOSIS — D62 Acute posthemorrhagic anemia: Secondary | ICD-10-CM | POA: Diagnosis not present

## 2018-08-06 DIAGNOSIS — S72141D Displaced intertrochanteric fracture of right femur, subsequent encounter for closed fracture with routine healing: Secondary | ICD-10-CM | POA: Diagnosis not present

## 2018-08-06 DIAGNOSIS — M25551 Pain in right hip: Secondary | ICD-10-CM | POA: Diagnosis not present

## 2018-08-06 DIAGNOSIS — E1100 Type 2 diabetes mellitus with hyperosmolarity without nonketotic hyperglycemic-hyperosmolar coma (NKHHC): Secondary | ICD-10-CM | POA: Diagnosis not present

## 2018-08-06 DIAGNOSIS — F0280 Dementia in other diseases classified elsewhere without behavioral disturbance: Secondary | ICD-10-CM | POA: Diagnosis not present

## 2018-08-06 DIAGNOSIS — R41841 Cognitive communication deficit: Secondary | ICD-10-CM | POA: Diagnosis not present

## 2018-08-06 DIAGNOSIS — Z4789 Encounter for other orthopedic aftercare: Secondary | ICD-10-CM | POA: Diagnosis not present

## 2018-08-06 DIAGNOSIS — Z7401 Bed confinement status: Secondary | ICD-10-CM | POA: Diagnosis not present

## 2018-08-06 DIAGNOSIS — E119 Type 2 diabetes mellitus without complications: Secondary | ICD-10-CM | POA: Diagnosis not present

## 2018-08-06 DIAGNOSIS — M2550 Pain in unspecified joint: Secondary | ICD-10-CM | POA: Diagnosis not present

## 2018-08-06 DIAGNOSIS — S72141A Displaced intertrochanteric fracture of right femur, initial encounter for closed fracture: Principal | ICD-10-CM

## 2018-08-06 DIAGNOSIS — E440 Moderate protein-calorie malnutrition: Secondary | ICD-10-CM | POA: Diagnosis not present

## 2018-08-06 DIAGNOSIS — E039 Hypothyroidism, unspecified: Secondary | ICD-10-CM | POA: Diagnosis not present

## 2018-08-06 DIAGNOSIS — N390 Urinary tract infection, site not specified: Secondary | ICD-10-CM | POA: Diagnosis not present

## 2018-08-06 DIAGNOSIS — M6281 Muscle weakness (generalized): Secondary | ICD-10-CM | POA: Diagnosis not present

## 2018-08-06 DIAGNOSIS — K219 Gastro-esophageal reflux disease without esophagitis: Secondary | ICD-10-CM | POA: Diagnosis not present

## 2018-08-06 DIAGNOSIS — R1312 Dysphagia, oropharyngeal phase: Secondary | ICD-10-CM | POA: Diagnosis not present

## 2018-08-06 DIAGNOSIS — G309 Alzheimer's disease, unspecified: Secondary | ICD-10-CM | POA: Diagnosis not present

## 2018-08-06 DIAGNOSIS — Z8673 Personal history of transient ischemic attack (TIA), and cerebral infarction without residual deficits: Secondary | ICD-10-CM | POA: Diagnosis not present

## 2018-08-06 LAB — CBC
HEMATOCRIT: 27.8 % — AB (ref 36.0–46.0)
Hemoglobin: 8.7 g/dL — ABNORMAL LOW (ref 12.0–15.0)
MCH: 26.4 pg (ref 26.0–34.0)
MCHC: 31.3 g/dL (ref 30.0–36.0)
MCV: 84.5 fL (ref 80.0–100.0)
Platelets: 96 10*3/uL — ABNORMAL LOW (ref 150–400)
RBC: 3.29 MIL/uL — ABNORMAL LOW (ref 3.87–5.11)
RDW: 15.8 % — ABNORMAL HIGH (ref 11.5–15.5)
WBC: 8.1 10*3/uL (ref 4.0–10.5)
nRBC: 0.4 % — ABNORMAL HIGH (ref 0.0–0.2)

## 2018-08-06 LAB — BPAM RBC
Blood Product Expiration Date: 202001082359
Blood Product Expiration Date: 202001082359
ISSUE DATE / TIME: 201912110842
ISSUE DATE / TIME: 201912111542
Unit Type and Rh: 5100
Unit Type and Rh: 5100

## 2018-08-06 LAB — BASIC METABOLIC PANEL
Anion gap: 7 (ref 5–15)
BUN: 21 mg/dL (ref 8–23)
CO2: 25 mmol/L (ref 22–32)
Calcium: 7.7 mg/dL — ABNORMAL LOW (ref 8.9–10.3)
Chloride: 113 mmol/L — ABNORMAL HIGH (ref 98–111)
Creatinine, Ser: 0.79 mg/dL (ref 0.44–1.00)
GFR calc Af Amer: >60 mL/min (ref >60)
GFR calc non Af Amer: >60 mL/min (ref >60)
Glucose, Bld: 144 mg/dL — ABNORMAL HIGH (ref 70–99)
Potassium: 3.7 mmol/L (ref 3.5–5.1)
Sodium: 145 mmol/L (ref 135–145)

## 2018-08-06 LAB — GLUCOSE, CAPILLARY
Glucose-Capillary: 123 mg/dL — ABNORMAL HIGH (ref 70–99)
Glucose-Capillary: 129 mg/dL — ABNORMAL HIGH (ref 70–99)
Glucose-Capillary: 164 mg/dL — ABNORMAL HIGH (ref 70–99)

## 2018-08-06 LAB — TYPE AND SCREEN
ABO/RH(D): O POS
Antibody Screen: NEGATIVE
Unit division: 0
Unit division: 0

## 2018-08-06 MED ORDER — INSULIN ASPART 100 UNIT/ML ~~LOC~~ SOLN: 0.0000 [IU] | Freq: Three times a day (TID) | SUBCUTANEOUS | 11 refills | Status: DC

## 2018-08-06 MED ORDER — HYDROCODONE-ACETAMINOPHEN 5-325 MG PO TABS: 1.0000 | ORAL_TABLET | Freq: Four times a day (QID) | ORAL | 0 refills | Status: DC | PRN

## 2018-08-06 MED ORDER — INSULIN GLARGINE 100 UNIT/ML ~~LOC~~ SOLN: 10.0000 [IU] | Freq: Every day | SUBCUTANEOUS | 11 refills | Status: DC

## 2018-08-06 MED ORDER — DOCUSATE SODIUM 100 MG PO CAPS: 100.0000 mg | ORAL_CAPSULE | Freq: Two times a day (BID) | ORAL | 0 refills | Status: DC

## 2018-08-06 MED ORDER — ACETAMINOPHEN 325 MG PO TABS: 650.0000 mg | ORAL_TABLET | Freq: Four times a day (QID) | ORAL | 0 refills | Status: DC | PRN

## 2018-08-06 MED ORDER — CEFUROXIME AXETIL 500 MG PO TABS: 250.0000 mg | ORAL_TABLET | Freq: Two times a day (BID) | ORAL | 0 refills | Status: AC

## 2018-08-06 MED ORDER — ACETAMINOPHEN 500 MG PO TABS: 1000.0000 mg | ORAL_TABLET | Freq: Three times a day (TID) | ORAL | 0 refills | Status: DC

## 2018-08-06 MED ORDER — ENSURE ENLIVE PO LIQD: 237.0000 mL | Freq: Every day | ORAL | 12 refills | Status: DC

## 2018-08-06 NOTE — Care Management Important Message (Signed)
Important Message  Patient Details  Name: Kristin Grant MRN: [id number] Date of Birth: Dec 27, 1940   Medicare Important Message Given:  Yes    Tahj Lindseth Stefan ChurchBratton 08/06/2018, 4:07 PM

## 2018-08-06 NOTE — Discharge Summary (Addendum)
Physician Discharge Summary  Kristin Grant GBM:[id number] DOB: 27-Mar-1941 DOA: 08/02/2018  PCP: [name], MD  Admit date: 08/02/2018 Discharge date: 08/06/2018 Consultations: Orthopedics Dr Rodell Perna Admitted From: SNF Disposition: SNF  Discharge Diagnoses:  Principal Problem:   Closed intertrochanteric fracture of hip, right, initial encounter Kristin Grant) Active Problems:   Dementia (Kristin Grant)   Hypertension   Hypothyroidism   Diabetes mellitus, type II (Kristin Grant)   Coronary artery Grant   Malnutrition of moderate degree   Acute blood loss anemia   Hospitalization Summary: Patient is a 77 year old female with history of Kristin Grant, CAD, diabetes, hypertension, GERD, prior CVA presented from skilled nursing facility after a mechanical fall with severe pain to right hip after the fall. Right hip x-ray showed mildly displaced intertrochanteric fracture of the right femoral neck. She was admitted for further evaluation and management with orthopedic consultation.  Mechanical fall, Closed intertrochanteric fracture of hip, right, initial encounter (Kristin Grant) -Status post right enteric nail with interlock for right comminuted closed intertrochanteric hip fracture by orthopedics. Seen by  PT during the Grant course and per their evaluation, will go back to skilled nursing facility with WBAT to RLE and further progression per PT eval at SNF.  -Pain control with scheduled acetaminophen and as needed hydrocodone (advised to minimize opiate use) , DVT prophylaxis with full dose ASA X 4 weeks per orthopedics  Acute metabolic encephalopathy likely due to UTI, superimposed on dementia -No growth on urine cultures, S/P 2 days abx therapy with IV Rocephin, will transition to oral abx  -Leucocytosis resolved. Afebrile. Mental status back to baseline now. -Discontinued Tramadol, Robaxin.  Placed on scheduled Tylenol for 3 days. Hydrocodone available for severe pain but would limit use in this elderly  patient.   Acute blood loss anemia -Postop, with some hemodilution component -Hemoglobin 6.2 on 12/11 requiring 2 units packed RBCs.     Hypertension -Currently stable, continue propranolol and lisinopril  Prior history of CVA -Restarted aspirin and Plavix. ( Can transition ASA back to 81 mg in 3-4 weeks)    Hypothyroidism -Continue Synthroid    Diabetes mellitus, type II (Kristin Grant), uncontrolled with hyperglycemia, history of CVA -Hemoglobin A1c 6.7 on 10/10 -BG stable here on Lantus 10 units (was on higher dose of Lantus and premeal insulin prior to admission) and sliding scale insulin sensitive. Will discharge on same but to be titrated per dietary intake at SNF. Resume Tradjenta.     Coronary artery Grant -Currently no active cardiac symptoms, no chest pain or shortness of breath, continue aspirin and Plavix        Discharge Exam: Vitals:   08/06/18 0549 08/06/18 0837  BP: (!) 128/54 126/67  Pulse: 78 76  Resp: 18 18  Temp: 98.1 F (36.7 C) 98.4 F (36.9 C)  SpO2: 96% 98%   Vitals:   08/05/18 1817 08/05/18 2016 08/06/18 0549 08/06/18 0837  BP: 119/61 (!) 114/52 (!) 128/54 126/67  Pulse: 90 79 78 76  Resp: _0 Temp: 97.7 F (36.5 C) 98.4 F (36.9 C) 98.1 F (36.7 C) 98.4 F (36.9 C)  TempSrc: Oral Oral Oral Oral  SpO2: 95% 96% 96% 98%  Weight:      Height:        General: Pt is alert, awake, not in acute distress. Oriented to place, person but not time (baseline per daughter bedside) Cardiovascular: RRR, S1/S2 +, no rubs, no gallops Respiratory: CTA bilaterally, no wheezing, no rhonchi Abdominal: Soft, NT, ND, bowel sounds +  Extremities: staples at surgical site/right hip  Discharge Instructions  Discharge Instructions    Call MD for:  extreme fatigue   Complete by:  As directed    Call MD for:  persistant nausea and vomiting   Complete by:  As directed    Call MD for:  redness, tenderness, or signs of infection (pain, swelling,  redness, odor or green/yellow discharge around incision site)   Complete by:  As directed    Call MD for:  severe uncontrolled pain   Complete by:  As directed    Call MD for:  temperature >100.4   Complete by:  As directed    Diet - low sodium heart healthy   Complete by:  As directed    Increase activity slowly   Complete by:  As directed    Other Restrictions   Complete by:  As directed    WBAT to RLE     Allergies as of 08/06/2018   No Known Allergies     Medication List    STOP taking these medications   aspirin 81 MG tablet Replaced by:  aspirin 325 MG EC tablet   Blood Glucose Monitor System w/Device Kit   guaiFENesin-dextromethorphan 100-10 MG/5ML syrup Commonly known as:  ROBITUSSIN DM   Insulin Glargine 100 UNIT/ML Solostar Pen Commonly known as:  LANTUS SOLOSTAR Replaced by:  insulin glargine 100 UNIT/ML injection   insulin lispro 100 UNIT/ML cartridge Commonly known as:  HUMALOG   Insulin Pen Needle 30G X 8 MM Misc Commonly known as:  NOVOFINE AUTOCOVER   methocarbamol 500 MG tablet Commonly known as:  ROBAXIN   traMADol 50 MG tablet Commonly known as:  ULTRAM     TAKE these medications   acetaminophen 500 MG tablet Commonly known as:  TYLENOL Take 2 tablets (1,000 mg total) by mouth every 8 (eight) hours for 4 days.   acetaminophen 325 MG tablet Commonly known as:  TYLENOL Take 2 tablets (650 mg total) by mouth every 6 (six) hours as needed for mild pain, moderate pain or fever.   aspirin 325 MG EC tablet Take 1 tablet (325 mg total) by mouth daily with breakfast. Replaces:  aspirin 81 MG tablet   cefUROXime 500 MG tablet Commonly known as:  CEFTIN Take 0.5 tablets (250 mg total) by mouth 2 (two) times daily with a meal for 3 days.   clopidogrel 75 MG tablet Commonly known as:  PLAVIX TAKE 1 TABLET (54m) BY MOUTH ONCE DAILY. What changed:    how much to take  how to take this  when to take this  additional instructions   DAILY  VITE Tabs TAKE 1 TABLET BY MOUTH ONCE DAILY. What changed:  when to take this   docusate sodium 100 MG capsule Commonly known as:  COLACE Take 1 capsule (100 mg total) by mouth 2 (two) times daily.   feeding supplement (ENSURE ENLIVE) Liqd Take 237 mLs by mouth daily with breakfast. Start taking on:  August 07, 2018 What changed:    how much to take  when to take this   HYDROcodone-acetaminophen 5-325 MG tablet Commonly known as:  NORCO/VICODIN Take 1 tablet by mouth every 6 (six) hours as needed for up to 3 days for moderate pain.   insulin aspart 100 UNIT/ML injection Commonly known as:  novoLOG Inject 0-9 Units into the skin 3 (three) times daily with meals.   insulin glargine 100 UNIT/ML injection Commonly known as:  LANTUS Inject 0.1 mLs (10 Units total)  into the skin at bedtime. Replaces:  Insulin Glargine 100 UNIT/ML Solostar Pen   levothyroxine 50 MCG tablet Commonly known as:  SYNTHROID, LEVOTHROID TAKE 1 TABLET BY MOUTH ONCE DAILY. What changed:  when to take this   lisinopril 40 MG tablet Commonly known as:  PRINIVIL,ZESTRIL TAKE 1 TABLET BY MOUTH ONCE DAILY. What changed:    how much to take  how to take this  when to take this   mirtazapine 15 MG tablet Commonly known as:  REMERON TAKE 1 TABLET BY MOUTH AT BEDTIME.   pantoprazole 40 MG tablet Commonly known as:  PROTONIX TAKE 1 TABLET BY MOUTH ONCE DAILY. What changed:  when to take this   propranolol 40 MG tablet Commonly known as:  INDERAL TAKE 1 TABLET BY MOUTH TWICE DAILY.   TRADJENTA 5 MG Tabs tablet Generic drug:  linagliptin TAKE 1 TABLET BY MOUTH ONCE DAILY. What changed:  how much to take       Contact information for follow-up providers    Marybelle Killings, MD. Schedule an appointment as soon as possible for a visit today.   Specialty:  Orthopedic Surgery Why:  need return office visit with dr yates 2 weeks postop Contact information: [place]  [phone]            Contact information for after-discharge care    [facility] .   Service:  Skilled Nursing Contact information: [address] [place] [phone]                 No Known Allergies     Discharge Condition:stable CODE STATUS:Full code Diet recommendation: 2 gram sodium, diabetic diet Wound care: Dry dressing at staples Recommendations for Outpatient Follow-up:  1. Follow up with PCP at SNF 2. Please obtain CBC in one week 3. Follow up Orthopedics, Dr Lorin Mercy in 10-14 days FOR STAPLE REMOVAL      The results of significant diagnostics from this hospitalization (including imaging, microbiology, ancillary and laboratory) are listed below for reference.     Microbiology: Recent Results (from the past 240 hour(s))  MRSA PCR Screening     Status: None   Collection Time: 08/03/18  8:00 AM  Result Value Ref Range Status   MRSA by PCR NEGATIVE NEGATIVE Final    Comment:        The GeneXpert MRSA Assay (FDA approved for NASAL specimens only), is one component of a comprehensive MRSA colonization surveillance program. It is not intended to diagnose MRSA infection nor to guide or monitor treatment for MRSA infections. Performed at [facility], [place] [address], [place], [place] [address]   Culture, blood (routine x 2)     Status: None (Preliminary result)   Collection Time: 08/04/18  5:48 AM  Result Value Ref Range Status   Specimen Description BLOOD RIGHT HAND  Final   Special Requests   Final    BOTTLES DRAWN AEROBIC ONLY Blood Culture adequate volume   Culture   Final    NO GROWTH 2 DAYS Performed at [facility], [place] [address] [place], [place] [address]    Report Status PENDING  Incomplete  Culture, blood (routine x 2)     Status: None (Preliminary result)   Collection Time: 08/04/18  6:00 AM  Result Value Ref Range Status   Specimen  Description BLOOD RIGHT ANTECUBITAL  Final   Special Requests  Final    BOTTLES DRAWN AEROBIC ONLY Blood Culture adequate volume   Culture   Final    NO GROWTH 2 DAYS Performed at [facility], [place] [address], [place], [place] [address]    Report Status PENDING  Incomplete  Urine Culture     Status: None   Collection Time: 08/04/18 11:30 AM  Result Value Ref Range Status   Specimen Description URINE, CATHETERIZED  Final   Special Requests NONE  Final   Culture   Final    NO GROWTH Performed at [facility], [place] [address], [place], [place] [address]    Report Status 08/05/2018 FINAL  Final     Labs: BNP (last 3 results) No results for input(s): BNP in the last 8760 hours. Basic Metabolic Panel: Recent Labs  Lab 08/03/18 0146 08/04/18 0305 08/05/18 0226 08/06/18 0702  NA 141 143 142 145  K 3.8 4.0 3.6 3.7  CL 107 110 110 113*  CO2 _0 GLUCOSE 163* 169* 214* 144*  BUN 14 19 24* 21  CREATININE 0.81 1.03* 0.97 0.79  CALCIUM 8.7* 7.6* 7.6* 7.7*   Liver Function Tests: Recent Labs  Lab 08/03/18 0146  AST 20  ALT 19  ALKPHOS 50  BILITOT 0.6  PROT 6.4*  ALBUMIN 3.6   No results for input(s): LIPASE, AMYLASE in the last 168 hours. No results for input(s): AMMONIA in the last 168 hours. CBC: Recent Labs  Lab 08/03/18 0146 08/04/18 0305 08/05/18 0226 08/06/18 0702  WBC 15.1* 11.0* 7.3 8.1  NEUTROABS 12.9*  --   --   --   HGB 12.9 8.1* 6.2* 8.7*  HCT 43.5 27.3* 20.6* 27.8*  MCV 86.1 85.3 86.2 84.5  PLT 149* 129* 91* 96*   Cardiac Enzymes: No results for input(s): CKTOTAL, CKMB, CKMBINDEX, TROPONINI in the last 168 hours. BNP: Invalid input(s): POCBNP CBG: Recent Labs  Lab 08/05/18 1136 08/05/18 1635 08/05/18 2125 08/06/18 0653 08/06/18 1132  GLUCAP 208* 263* 141* 123* 129*   D-Dimer No results for input(s): DDIMER in the last 72 hours. Hgb A1c Recent Labs    08/04/18 0305  HGBA1C 5.8*   Lipid Profile No results for  input(s): CHOL, HDL, LDLCALC, TRIG, CHOLHDL, LDLDIRECT in the last 72 hours. Thyroid function studies No results for input(s): TSH, T4TOTAL, T3FREE, THYROIDAB in the last 72 hours.  Invalid input(s): FREET3 Anemia work up No results for input(s): VITAMINB12, FOLATE, FERRITIN, TIBC, IRON, RETICCTPCT in the last 72 hours. Urinalysis    Component Value Date/Time   COLORURINE YELLOW 08/04/2018 1147   APPEARANCEUR HAZY (A) 08/04/2018 1147   LABSPEC 1.021 08/04/2018 1147   PHURINE 5.0 08/04/2018 1147   GLUCOSEU NEGATIVE 08/04/2018 1147   HGBUR NEGATIVE 08/04/2018 1147   [place] 08/04/2018 1147   [place] 08/04/2018 1147   PROTEINUR NEGATIVE 08/04/2018 1147   UROBILINOGEN 0.2 10/11/2013 0634   NITRITE NEGATIVE 08/04/2018 1147   LEUKOCYTESUR LARGE (A) 08/04/2018 1147   Sepsis Labs Invalid input(s): PROCALCITONIN,  WBC,  LACTICIDVEN Microbiology Recent Results (from the past 240 hour(s))  MRSA PCR Screening     Status: None   Collection Time: 08/03/18  8:00 AM  Result Value Ref Range Status   MRSA by PCR NEGATIVE NEGATIVE Final    Comment:        The GeneXpert MRSA Assay (FDA approved for NASAL specimens only), is one component of a comprehensive MRSA colonization surveillance program. It is not intended to diagnose  MRSA infection nor to guide or monitor treatment for MRSA infections. Performed at [facility], [place] [address], [place], [place] [address]   Culture, blood (routine x 2)     Status: None (Preliminary result)   Collection Time: 08/04/18  5:48 AM  Result Value Ref Range Status   Specimen Description BLOOD RIGHT HAND  Final   Special Requests   Final    BOTTLES DRAWN AEROBIC ONLY Blood Culture adequate volume   Culture   Final    NO GROWTH 2 DAYS Performed at [facility], [place] [address], [place], [place] [address]    Report Status PENDING  Incomplete  Culture, blood (routine x 2)     Status: None (Preliminary result)    Collection Time: 08/04/18  6:00 AM  Result Value Ref Range Status   Specimen Description BLOOD RIGHT ANTECUBITAL  Final   Special Requests   Final    BOTTLES DRAWN AEROBIC ONLY Blood Culture adequate volume   Culture   Final    NO GROWTH 2 DAYS Performed at [facility], [place] [address] [place], [place] [address]    Report Status PENDING  Incomplete  Urine Culture     Status: None   Collection Time: 08/04/18 11:30 AM  Result Value Ref Range Status   Specimen Description URINE, CATHETERIZED  Final   Special Requests NONE  Final   Culture   Final    NO GROWTH Performed at [facility], [place] [address], [place], [place] [address]    Report Status 08/05/2018 FINAL  Final    Procedures/Studies: Dg Chest 1 View  Result Date: 08/03/2018 CLINICAL DATA:  [age]-year-old female with fall and right hip pain. EXAM: CHEST  1 VIEW COMPARISON:  Chest radiograph dated 01/17/2017 FINDINGS: Mild chronic interstitial coarsening. No focal consolidation, pleural effusion, or pneumothorax. Minimal left lung base atelectasis/scarring. The cardiac silhouette is within normal limits. Coronary vascular calcifications. No acute osseous pathology. IMPRESSION: No active Grant. Electronically Signed   By: Anner Crete M.D.   On: 08/03/2018 01:26   Dg C-arm 1-60 Min  Result Date: 08/03/2018 CLINICAL DATA:  Intraoperative imaging for fixation of a right hip fracture which the patient suffered a fall last night. Initial encounter. EXAM: OPERATIVE RIGHT HIP (WITH PELVIS IF PERFORMED) to VIEWS TECHNIQUE: Fluoroscopic spot image(s) were submitted for interpretation post-operatively. COMPARISON:  Plain films right hip 08/03/2018. FINDINGS: Hip screw and short intramedullary nail with a single distal screw are in place for fixation of an intertrochanteric fracture. Position and alignment are anatomic. No acute abnormality. IMPRESSION: Intraoperative imaging for fixation of a right intertrochanteric  fracture. Electronically Signed   By: Inge Rise M.D.   On: 08/03/2018 17:17   Dg Hip Operative Unilat W Or W/o Pelvis Right  Result Date: 08/03/2018 CLINICAL DATA:  Intraoperative imaging for fixation of a right hip fracture which the patient suffered a fall last night. Initial encounter. EXAM: OPERATIVE RIGHT HIP (WITH PELVIS IF PERFORMED) to VIEWS TECHNIQUE: Fluoroscopic spot image(s) were submitted for interpretation post-operatively. COMPARISON:  Plain films right hip 08/03/2018. FINDINGS: Hip screw and short intramedullary nail with a single distal screw are in place for fixation of an intertrochanteric fracture. Position and alignment are anatomic. No acute abnormality. IMPRESSION: Intraoperative imaging for fixation of a right intertrochanteric fracture. Electronically Signed   By: Inge Rise M.D.   On: 08/03/2018 17:17   Dg Hip Unilat W Or Wo Pelvis 2-3 Views Right  Result  Date: 08/03/2018 CLINICAL DATA:  77 year old female with fall and right hip pain. EXAM: DG HIP (WITH OR WITHOUT PELVIS) 2-3V RIGHT COMPARISON:  None. FINDINGS: There is mildly displaced intertrochanteric fracture of the right femoral neck with mild varus angulation. There is advanced osteopenia. No dislocation. Vascular calcifications noted. The soft tissues are unremarkable. IMPRESSION: Mildly displaced intertrochanteric fracture of the right femoral neck. Electronically Signed   By: Anner Crete M.D.   On: 08/03/2018 01:25   (Echo, Carotid, EGD, Colonoscopy, ERCP)  Time coordinating discharge: Over 30 minutes  SIGNED:   Guilford Shi, MD  [facility] 08/06/2018, 2:17 PM Pager   If 7PM-7AM, please contact night-coverage [email or web address] Password [email or web address]

## 2018-08-06 NOTE — Clinical Social Work Placement (Signed)
   CLINICAL SOCIAL WORK PLACEMENT  NOTE  Date:  08/06/2018  Patient Details  Name: Kristin Grant MRN: [id number] Date of Birth: March 07, 1941  Clinical Social Work is seeking post-discharge placement for this patient at the Skilled  Nursing Facility level of care (*CSW will initial, date and re-position this form in  chart as items are completed):      Patient/family provided with [facility] Clinical [facility] Work Department's list of facilities offering this level of care within the geographic area requested by the patient (or if unable, by the patient's family).  Yes   Patient/family informed of their freedom to choose among providers that offer the needed level of care, that participate in Medicare, Medicaid or managed care program needed by the patient, have an available bed and are willing to accept the patient.      Patient/family informed of [facility]'s ownership interest in [facility] and [facility], as well as of the fact that they are under no obligation to receive care at these facilities.  PASRR submitted to EDS on       PASRR number received on 08/04/18     Existing PASRR number confirmed on       FL2 transmitted to all facilities in geographic area requested by pt/family on 08/04/18     FL2 transmitted to all facilities within larger geographic area on       Patient informed that his/her managed care company has contracts with or will negotiate with certain facilities, including the following:        Yes   Patient/family informed of bed offers received.  [facility]     Physician recommends and patient chooses bed at      Patient to be transferred to [facility] on 08/06/18.  Patient to be transferred to facility by PTAR     Patient family notified on 08/06/18 of transfer.  Name of family member notified:  Gerlene BurdockRichard (son)     PHYSICIAN       Additional Comment:     _______________________________________________ Gildardo GriffesAshley M Jady Braggs, LCSW 08/06/2018, 2:47 PM

## 2018-08-06 NOTE — Plan of Care (Signed)
  Problem: Education: Goal: Knowledge of General Education information will improve Description Including pain rating scale, medication(s)/side effects and non-pharmacologic comfort measures Outcome: Progressing   Problem: Health Behavior/Discharge Planning: Goal: Ability to manage health-related needs will improve Outcome: Progressing   

## 2018-08-06 NOTE — Progress Notes (Signed)
Subjective: Doing well.  Pain controlled.  Family member in room.     Objective: Vital signs in last 24 hours: Temp:  [97.7 F (36.5 C)-101.1 F (38.4 C)] 98.4 F (36.9 C) (12/12 0837) Pulse Rate:  [76-104] 76 (12/12 0837) Resp:  [14-18] 18 (12/12 0837) BP: (114-129)/(47-70) 126/67 (12/12 0837) SpO2:  [95 %-98 %] 98 % (12/12 0837)  Intake/Output from previous day: 12/11 0701 - 12/12 0700 In: 3497.8 [P.O.:240; I.V.:3157.8; IV Piggyback:100] Out: -  Intake/Output this shift: Total I/O In: -  Out: 1 [Stool:1]  Recent Labs    08/04/18 0305 08/05/18 0226 08/06/18 0702  HGB 8.1* 6.2* 8.7*   Recent Labs    08/05/18 0226 08/06/18 0702  WBC 7.3 8.1  RBC 2.39* 3.29*  HCT 20.6* 27.8*  PLT 91* 96*   Recent Labs    08/05/18 0226 08/06/18 0702  NA 142 145  K 3.6 3.7  CL 110 113*  CO2 26 25  BUN 24* 21  CREATININE 0.97 0.79  GLUCOSE 214* 144*  CALCIUM 7.6* 7.7*   No results for input(s): LABPT, INR in the last 72 hours.  Exam: Pleasant female.  NAD.  Wounds look good.  Staples intact.  No drainage or signs of infection.  NVI.        Assessment/Plan: Stable from ortho standpoint.  Transfer to SNF when ok with medicine service.     Zonia KiefJames Rilea Arutyunyan 08/06/2018, 9:32 AM

## 2018-08-06 NOTE — Progress Notes (Signed)
Patient will DC to: [facility] Anticipated DC date: 08/06/18 Family notified: Gerlene Burdockichard Transport by: Sharin MonsPTAR  Per MD patient ready for DC to [facility] . RN, patient, patient's family, and facility notified of DC. Discharge Summary sent to facility. RN given number for report [phone]. DC packet on chart. Ambulance transport requested for patient.  CSW signing off.  HattonAshley Daymond Cordts, Kentucky[place]CSW [phone]

## 2018-08-06 NOTE — Progress Notes (Signed)
[phone] x [phone]. Each time [facility] North[facility]ine answered and transferred to nursing unit and no answer each time.

## 2018-08-07 ENCOUNTER — Encounter: Payer: Self-pay | Admitting: Internal Medicine

## 2018-08-07 ENCOUNTER — Other Ambulatory Visit: Payer: Self-pay

## 2018-08-07 ENCOUNTER — Non-Acute Institutional Stay (SKILLED_NURSING_FACILITY): Payer: Medicare Other | Admitting: Internal Medicine

## 2018-08-07 DIAGNOSIS — I10 Essential (primary) hypertension: Secondary | ICD-10-CM

## 2018-08-07 DIAGNOSIS — F0280 Dementia in other diseases classified elsewhere without behavioral disturbance: Secondary | ICD-10-CM | POA: Diagnosis not present

## 2018-08-07 DIAGNOSIS — E1100 Type 2 diabetes mellitus with hyperosmolarity without nonketotic hyperglycemic-hyperosmolar coma (NKHHC): Secondary | ICD-10-CM

## 2018-08-07 DIAGNOSIS — D62 Acute posthemorrhagic anemia: Secondary | ICD-10-CM

## 2018-08-07 DIAGNOSIS — I2510 Atherosclerotic heart disease of native coronary artery without angina pectoris: Secondary | ICD-10-CM

## 2018-08-07 DIAGNOSIS — S72141A Displaced intertrochanteric fracture of right femur, initial encounter for closed fracture: Secondary | ICD-10-CM

## 2018-08-07 MED ORDER — HYDROCODONE-ACETAMINOPHEN 5-325 MG PO TABS: ORAL_TABLET | ORAL | 0 refills | Status: DC

## 2018-08-07 NOTE — Consult Note (Signed)
            [facility] CM Primary Care Navigator  08/07/2018  Henrietta Dinelsene Fundora 12/10/1940 [id number]   Attempt to see patientat the bedsideto identify possible discharge needs but she was already discharged per staff report.  Patientwas discharged to skilled nursing facility per therapy recommendation [facility]). Per Inpatient social worker note, [facility] [place].  Per chart review,patientpresented from nursing facility after a mechanical fall with severe pain to right hip after falling, sustained displaced intertrochanteric fracture of the right femoral neck and underwent IM- intramedullary nailing of right hip.   Primary care provider's office is listed as providing transition of care (TOC) follow-up.  Patient has discharge instruction to follow-up with primary care provider and orthopedic surgery follow-up in 2 weeks.   For additional questions please contact:  Karin GoldenLorraine A. Trexton Escamilla, BSN, RN-BC [facility] PRIMARY CARE Navigator Cell: [phone]

## 2018-08-07 NOTE — Telephone Encounter (Signed)
RX Fax for [facility]@ [phone]  

## 2018-08-07 NOTE — Progress Notes (Signed)
Location:    [facility] Room Number: 115/D Place of Service:  SNF (31) Provider:  Edmon CrapeArlo Perl [facility]  Holland, Velna HatchetKawanta F, MD  Patient Care Team: Salley Scarleturham, Kawanta F, MD as PCP - General [facility](Family Medicine)  Extended Emergency Contact Information Primary Emergency Contact: Tharon AquasGoins,Richard          [place], [place] Mobile Phone: [phone] Relation: Son Secondary Emergency Contact: Lynwood DawleyZorn, Ann Mobile Phone: [phone] Relation: Daughter  Code Status:  Full Code Goals of care: Advanced Directive information Advanced Directives 08/07/2018  Does Patient Have a Medical Advance Directive? Yes  Type of Advance Directive (No Data)  Does patient want to make changes to medical advance directive? No - Patient declined  Would patient like information on creating a medical advance directive? No - Patient declined  Pre-existing out of facility DNR order (yellow form or pink MOST form) -     Chief Complaint  Patient presents with  . Hospitalization Follow-up    Hospitalization F/U Visit  Status post right hip fracture with repair  HPI:  Pt is a 77 y.o. female seen today for a hospital follow-up after undergoing a right hip repair after a fall with subsequent fracture.  Patient also has a history of Alzheimer's dementia as well as coronary artery disease type 2 diabetes hypertension GERD and a prior CVA.  She lives in an assisted living facility apparently had a mechanical fall with severe pain to her right hip- x-ray showed a mildly displaced intertrochanteric fracture of the right femoral neck.  She underwent a nailing with interlock-by orthopedics- apparently she did have postop anemia and required a transfusion hemoglobin was as low as 6.2 it was 8.7 yesterday  She does have orders for Tylenol and Norco as needed for pain she not currently complaining of pain.  She also apparently had increased confusion thought secondary to UTI  superimposed on dementia there was no growth on urine culture she did receive antibiotic therapy which she is completing.  She did have mild leukocytosis which resolved.  Apparently confusion is resolved she does have baseline dementia however Her tramadol was discontinued as well as Robaxin and she was put on scheduled Tylenol for 3 days which she is completing-recommendation to treat use hydrocodone judiciously.  She also has a history of hypertension which appears well controlled on propanolol and lisinopril manual blood pressure today was 130/56.  For DVT prophylaxis she is on aspirin 325 mg for 4 weeks to be reduced to 81 mg for 4 weeks-she is also on Plavix she is also on this for history of CVA as well as coronary artery disease.  She is a type II diabetic she is on Tradjenta and Lantus A1c was 6.7 in October blood sugar so far appear to be more in the mid 100s it was 239 at lunch today at this point will monitor   Currently she is sitting in her wheelchair comfortably she does not have any complaints she appears to be pleasantly confused    Past Medical History:  Diagnosis Date  . Alzheimer disease (HCC)   . Arthritis   . Coronary artery disease   . Diabetes mellitus   . Difficulty walking   . Hypertension   . Reflux   . Stroke [facility](HCC)    Past Surgical History:  Procedure Laterality Date  . APPENDECTOMY    . FINGER DEBRIDEMENT    . INTRAMEDULLARY (IM) NAIL INTERTROCHANTERIC Right 08/03/2018   Procedure: INTRAMEDULLARY (IM)  NAIL INTERTROCHANTRIC;  Surgeon: Eldred Manges, MD;  Location: [facility] OR;  Service: Orthopedics;  Laterality: Right;    No Known Allergies  Outpatient Encounter Medications as of 08/07/2018  Medication Sig  . acetaminophen (TYLENOL) 325 MG tablet Take 2 tablets (650 mg total) by mouth every 6 (six) hours as needed for mild pain, moderate pain or fever.  Marland Kitchen acetaminophen (TYLENOL) 500 MG tablet Take 2 tablets (1,000 mg total) by mouth every 8 (eight) hours  for 4 days.  Marland Kitchen aspirin 81 MG chewable tablet Chew 81 mg by mouth daily. Starting 09/04/2018  . aspirin EC 325 MG EC tablet Take 1 tablet (325 mg total) by mouth daily with breakfast.  . cefUROXime (CEFTIN) 500 MG tablet Take 0.5 tablets (250 mg total) by mouth 2 (two) times daily with a meal for 3 days.  . clopidogrel (PLAVIX) 75 MG tablet TAKE 1 TABLET (75mg ) BY MOUTH ONCE DAILY.  Marland Kitchen docusate sodium (COLACE) 100 MG capsule Take 1 capsule (100 mg total) by mouth 2 (two) times daily.  . feeding supplement, ENSURE ENLIVE, (ENSURE ENLIVE) LIQD Take 237 mLs by mouth daily with breakfast.  . HYDROcodone-acetaminophen (NORCO/VICODIN) 5-325 MG tablet Take 1 tablet by mouth every 6 (six) hours as needed for up to 3 days for moderate pain.  Marland Kitchen insulin glargine (LANTUS) 100 UNIT/ML injection Inject 0.1 mLs (10 Units total) into the skin at bedtime.  Marland Kitchen levothyroxine (SYNTHROID, LEVOTHROID) 50 MCG tablet TAKE 1 TABLET BY MOUTH ONCE DAILY.  Marland Kitchen lisinopril (PRINIVIL,ZESTRIL) 40 MG tablet TAKE 1 TABLET BY MOUTH ONCE DAILY.  . mirtazapine (REMERON) 15 MG tablet TAKE 1 TABLET BY MOUTH AT BEDTIME.  . Multiple Vitamin (DAILY VITE) TABS TAKE 1 TABLET BY MOUTH ONCE DAILY.  . pantoprazole (PROTONIX) 40 MG tablet TAKE 1 TABLET BY MOUTH ONCE DAILY.  Marland Kitchen propranolol (INDERAL) 40 MG tablet TAKE 1 TABLET BY MOUTH TWICE DAILY.  Marland Kitchen TRADJENTA 5 MG TABS tablet TAKE 1 TABLET BY MOUTH ONCE DAILY.  . [DISCONTINUED] insulin aspart (NOVOLOG) 100 UNIT/ML injection Inject 0-9 Units into the skin 3 (three) times daily with meals.   No facility-administered encounter medications on file as of 08/07/2018.      Review of Systems   This is limited secondary to dementia but she is not complaining of any pain or shortness of breath or discomfort at this time nursing does not report any issues  Immunization History  Administered Date(s) Administered  . Influenza Split 06/30/2012  . Influenza, High Dose Seasonal PF 06/04/2018  .  Influenza,inj,Quad PF,6+ Mos 07/05/2013, 06/14/2015, 05/17/2016  . Influenza-Unspecified 06/06/2014  . PPD Test 07/19/2013  . Pneumococcal Conjugate-13 08/16/2013  . Tdap 08/13/2013   Pertinent  Health Maintenance Due  Topic Date Due  . FOOT EXAM  09/07/2018 (Originally 05/12/2018)  . OPHTHALMOLOGY EXAM  09/07/2018 (Originally 05/09/2016)  . HEMOGLOBIN A1C  02/03/2019  . INFLUENZA VACCINE  Completed  . DEXA SCAN  Completed  . PNA vac Low Risk Adult  Completed   Fall Risk  03/26/2018 03/04/2018 07/11/2017 01/01/2017 01/12/2016  Falls in the past year? No Yes No Yes No  Number falls in past yr: - 2 or more - 2 or more -  Injury with Fall? - Yes - No -  Comment - - - - -  Risk Factor Category  - High Fall Risk - - -  Risk for fall due to : - - - - -   Functional Status Survey:    Vitals:   08/07/18  1143  BP: (!) 153/66  Pulse: 83  Resp: 20  Temp: (!) 97.4 F (36.3 C)  TempSrc: Oral  SpO2: 100%  Manual blood pressure this afternoon was 130/56 Physical Exam  In general this is a pleasant fairly well-nourished elderly female in no distress sitting comfortably in her wheelchair.  Her skin is warm and dry she does have some slight bruising lower abdominal area.  She also has a small covering over what I suspect is a small skin tear right lower arm.  Eyes she has prescription lenses she does have some deviation to the left of her left eye-extraocular movements do appear to be intact pupils appear to be reactive to light sclera and conjunctive are clear visual acuity intact.  Oropharynx is clear mucous membranes moist.  Chest is clear to auscultation there is no labored breathing.  Heart is regular rate and rhythm without murmur gallop or rub she does not really have significant lower extremity edema.  Abdomen is soft nontender with positive bowel sounds.  Musculoskeletal Limited exam since she is in a wheelchair but is able to move her upper extremities at baseline as well as left  lower extremity limitations of her right lower extremity secondary to the recent surgery.  Surgical site appears to be benign staples are in place I do not see drainage bleeding or sign of infection.  Neurologic is grossly intact her speech is clear no lateralizing findings.  Psych she is oriented to self she is pleasant follow simple verbal commands without difficulty could not really tell me the year--- could tell me the month could not tell me day of week-- she knew that Christmas is coming up-- could not name the president  Labs reviewed: Recent Labs    08/04/18 0305 08/05/18 0226 08/06/18 0702  NA 143 142 145  K 4.0 3.6 3.7  CL 110 110 113*  CO2 24 26 25   GLUCOSE 169* 214* 144*  BUN 19 24* 21  CREATININE 1.03* 0.97 0.79  CALCIUM 7.6* 7.6* 7.7*   Recent Labs    11/14/17 2215 03/04/18 1044 06/04/18 1028 08/03/18 0146  AST 15 11 12 20   ALT 12* 9 9 19   ALKPHOS 67  --   --  50  BILITOT 0.4 0.4 0.4 0.6  PROT 6.4* 6.4 6.2 6.4*  ALBUMIN 3.7  --   --  3.6   Recent Labs    03/04/18 1044 06/04/18 1028 08/03/18 0146 08/04/18 0305 08/05/18 0226 08/06/18 0702  WBC 7.6 9.4 15.1* 11.0* 7.3 8.1  NEUTROABS 5,198 6,843 12.9*  --   --   --   HGB 13.5 12.9 12.9 8.1* 6.2* 8.7*  HCT 42.1 40.9 43.5 27.3* 20.6* 27.8*  MCV 84.0 83.6 86.1 85.3 86.2 84.5  PLT 145 184 149* 129* 91* 96*   Lab Results  Component Value Date   TSH 2.52 06/04/2018   Lab Results  Component Value Date   HGBA1C 5.8 (H) 08/04/2018   Lab Results  Component Value Date   CHOL 97 11/15/2017   HDL 35 (L) 11/15/2017   LDLCALC 47 11/15/2017   TRIG 75 11/15/2017   CHOLHDL 2.8 11/15/2017    Significant Diagnostic Results in last 30 days:  No results found.  Assessment/Plan  #1 history of right hip fracture status post repair- she continues on aspirin 325 mg a day for DVT prophylaxis for 4 weeks and then back to 81 mg a day-she will need PT and OT she is finishing a 3-day course of  routine Tylenol at  that point will become PRN she does have orders for Norco but suggestion to use this as judiciously as possible secondary to apparently some increased confusion in the hospital narcotics-her tramadol and Robaxin were discontinued in the hospital this point will monitor.  2.  History of anemia most likely postop hemoglobin has risen status post transfusion up to 8.7 will have this checked first laboratory day next week.  I also note she has what appears to be somewhat chronic thrombocytopenia platelet count was in the low 90s on most recent lab this will warrant updating as well I really did not appreciate much bruising except a lower abdominal area which did not appear to be  extensive acute or new.  3.  History of Alzheimer's dementia she appears to be doing well with supportive care she was living in an assisted living facility at this point continue supportive care I have not noted any behaviors.  4.  History of type 2 diabetes she is on Tradjenta and Lantus at this point will monitor blood sugar this morning was 132 at noon 239 at this point would not be real aggressive secondary to concerns for hypoglycemia hemoglobin A1c was 6.7 back in October which is acceptable.  5.  History of hypertension she is on propanolol and lisinopril systolic was mildly elevated this morning but this afternoon systolic is 130 at this point will monitor would like to get more readings before making any adjustments heart rate appears to be controlled.  6.  History of CVA continues on Plavix and aspirin again her aspirin will be titrated down to 81 mg in 4 weeks-.  7.  History of coronary artery disease as noted above she is on aspirin and Plavix does not appear to be symptomatic apparently this was stable during hospitalization.  8.  History of hypothyroidism she is on Synthroid TSH in October was within normal limits at 2.56.  9.  History of altered mental status again this was thought possibly to be possible  postop and medication related urine culture apparently did not grow out any specific organism she was put on an antibiotic apparently and improved she is completing this in the facility.  Again will update a CBC as well as a metabolic panel on Monday, December 16 clinically she appears to be stable will need therapy.  Also will have to watch her Tylenol use she is completing a course of thousand milligrams every 8 hours again this will be discontinued Monday-.  She will have PRN  [id number]- of note greater than 35 minutes spent assessing patient reviewing her chart and labs and coordinating and formulating a plan of care for numerous diagnoses-of note greater than 50% spent coordinating a plan of care with input as noted above

## 2018-08-10 ENCOUNTER — Encounter (HOSPITAL_COMMUNITY)
Admission: RE | Admit: 2018-08-10 | Discharge: 2018-08-10 | Disposition: A | Discharge status: Home / Self Care | Payer: Medicare Other | Source: Skilled Nursing Facility | Attending: Internal Medicine | Admitting: Internal Medicine

## 2018-08-10 ENCOUNTER — Encounter: Payer: Self-pay | Admitting: Internal Medicine

## 2018-08-10 ENCOUNTER — Non-Acute Institutional Stay (SKILLED_NURSING_FACILITY): Payer: Medicare Other | Admitting: Internal Medicine

## 2018-08-10 DIAGNOSIS — I10 Essential (primary) hypertension: Secondary | ICD-10-CM | POA: Diagnosis not present

## 2018-08-10 DIAGNOSIS — E1100 Type 2 diabetes mellitus with hyperosmolarity without nonketotic hyperglycemic-hyperosmolar coma (NKHHC): Secondary | ICD-10-CM | POA: Diagnosis not present

## 2018-08-10 DIAGNOSIS — I2510 Atherosclerotic heart disease of native coronary artery without angina pectoris: Secondary | ICD-10-CM | POA: Diagnosis not present

## 2018-08-10 DIAGNOSIS — E039 Hypothyroidism, unspecified: Secondary | ICD-10-CM | POA: Diagnosis not present

## 2018-08-10 DIAGNOSIS — F0280 Dementia in other diseases classified elsewhere without behavioral disturbance: Secondary | ICD-10-CM

## 2018-08-10 DIAGNOSIS — D62 Acute posthemorrhagic anemia: Secondary | ICD-10-CM | POA: Diagnosis not present

## 2018-08-10 DIAGNOSIS — Z9181 History of falling: Secondary | ICD-10-CM | POA: Insufficient documentation

## 2018-08-10 DIAGNOSIS — Z8673 Personal history of transient ischemic attack (TIA), and cerebral infarction without residual deficits: Secondary | ICD-10-CM

## 2018-08-10 DIAGNOSIS — Z4789 Encounter for other orthopedic aftercare: Secondary | ICD-10-CM | POA: Insufficient documentation

## 2018-08-10 DIAGNOSIS — S72141D Displaced intertrochanteric fracture of right femur, subsequent encounter for closed fracture with routine healing: Secondary | ICD-10-CM | POA: Insufficient documentation

## 2018-08-10 LAB — CBC WITH DIFFERENTIAL/PLATELET
Abs Immature Granulocytes: 0.05 10*3/uL (ref 0.00–0.07)
Basophils Absolute: 0 10*3/uL (ref 0.0–0.1)
Basophils Relative: 1 %
Eosinophils Absolute: 0.4 10*3/uL (ref 0.0–0.5)
Eosinophils Relative: 7 %
HCT: 28.8 % — ABNORMAL LOW (ref 36.0–46.0)
Hemoglobin: 8.9 g/dL — ABNORMAL LOW (ref 12.0–15.0)
Immature Granulocytes: 1 %
Lymphocytes Relative: 15 %
Lymphs Abs: 0.9 10*3/uL (ref 0.7–4.0)
MCH: 27.1 pg (ref 26.0–34.0)
MCHC: 30.9 g/dL (ref 30.0–36.0)
MCV: 87.8 fL (ref 80.0–100.0)
Monocytes Absolute: 0.7 10*3/uL (ref 0.1–1.0)
Monocytes Relative: 11 %
Neutro Abs: 3.9 10*3/uL (ref 1.7–7.7)
Neutrophils Relative %: 65 %
Platelets: 143 10*3/uL — ABNORMAL LOW (ref 150–400)
RBC: 3.28 MIL/uL — ABNORMAL LOW (ref 3.87–5.11)
RDW: 15.9 % — ABNORMAL HIGH (ref 11.5–15.5)
WBC: 6 10*3/uL (ref 4.0–10.5)
nRBC: 0.5 % — ABNORMAL HIGH (ref 0.0–0.2)

## 2018-08-10 LAB — BASIC METABOLIC PANEL
ANION GAP: 7 (ref 5–15)
BUN: 16 mg/dL (ref 8–23)
CO2: 26 mmol/L (ref 22–32)
Calcium: 8 mg/dL — ABNORMAL LOW (ref 8.9–10.3)
Chloride: 109 mmol/L (ref 98–111)
Creatinine, Ser: 0.61 mg/dL (ref 0.44–1.00)
GFR calc Af Amer: >60 mL/min (ref >60)
GFR calc non Af Amer: >60 mL/min (ref >60)
Glucose, Bld: 148 mg/dL — ABNORMAL HIGH (ref 70–99)
Potassium: 3.6 mmol/L (ref 3.5–5.1)
Sodium: 142 mmol/L (ref 135–145)

## 2018-08-10 NOTE — Progress Notes (Signed)
Provider: Einar Crow MD  Location:    [facility] Room Number: 115/D Place of Service:  SNF (31)  PCP: Salley Scarlet, MD Patient Care Team: Salley Scarlet, MD as PCP - General [facility] Medicine)  Extended Emergency Contact Information Primary Emergency Contact: Tharon Aquas [place], [place] Phone: [phone] Relation: Son Secondary Emergency Contact: Lynwood Dawley Mobile Phone: [phone] Relation: Daughter  Code Status: Full Code Goals of Care: Advanced Directive information Advanced Directives 08/10/2018  Does Patient Have a Medical Advance Directive? Yes  Type of Advance Directive (No Data)  Does patient want to make changes to medical advance directive? No - Patient declined  Would patient like information on creating a medical advance directive? No - Patient declined  Pre-existing out of facility DNR order (yellow form or pink MOST form) -      Chief Complaint  Patient presents with  . New Admit To SNF    New Admission Visit    HPI: Patient is a 77 y.o. female seen today for admission to SNF for therapy She stayed in the hospital from 12/08-12/12 For Right Hip Fracture after the Fall.  Patient has a history of Alzheimer's disease, CAD, diabetes type 2, Hypertension, history of CVA and GERD Patient had a mechanical fall at the facility that she resides.  She was found to have Right hip mildly displaced intertrochanteric fracture. She underwent intramedullary nail with Interlock on 12/09. Post op patient needed 2 units of PRBC He also had acute metabolic encephalopathy thought to be due to UTI though her cultures were negative.  She was treated with antibiotics.  Also to avoid pain medicines which were causing confusion. Patient is in SNF for therapy.  She is complaining of pain in that leg also was complaining of some nausea.  She was able to give some history but did not know much details. It  looks like patient lives in ALF and per patient she was walking with Dan Humphreys.  Past Medical History:  Diagnosis Date  . Alzheimer disease (HCC)   . Arthritis   . Coronary artery disease   . Diabetes mellitus   . Difficulty walking   . Hypertension   . Reflux   . Stroke [facility])    Past Surgical History:  Procedure Laterality Date  . APPENDECTOMY    . FINGER DEBRIDEMENT    . INTRAMEDULLARY (IM) NAIL INTERTROCHANTERIC Right 08/03/2018   Procedure: INTRAMEDULLARY (IM) NAIL INTERTROCHANTRIC;  Surgeon: Eldred Manges, MD;  Location: [facility] OR;  Service: Orthopedics;  Laterality: Right;    reports that she has never smoked. She has never used smokeless tobacco. She reports that she does not drink alcohol or use drugs. Social History   Socioeconomic History  . Marital status: Widowed    Spouse name: Not on file  . Number of children: Not on file  . Years of education: Not on file  . Highest education level: Not on file  Occupational History  . Not on file  Social Needs  . Financial resource strain: Not on file  . Food insecurity:    Worry: Not on file    Inability: Not on file  . Transportation needs:    Medical: Not on file    Non-medical: Not on file  Tobacco Use  . Smoking status: Never Smoker  . Smokeless tobacco: Never Used  Substance and Sexual Activity  . Alcohol use: No  .  Drug use: No  . Sexual activity: Not on file  Lifestyle  . Physical activity:    Days per week: Not on file    Minutes per session: Not on file  . Stress: Not on file  Relationships  . Social connections:    Talks on phone: Not on file    Gets together: Not on file    Attends religious service: Not on file    Active member of club or organization: Not on file    Attends meetings of clubs or organizations: Not on file    Relationship status: Not on file  . Intimate partner violence:    Fear of current or ex partner: Not on file    Emotionally abused: Not on file    Physically abused: Not on file      Forced sexual activity: Not on file  Other Topics Concern  . Not on file  Social History Narrative  . Not on file    Functional Status Survey:    Family History  Problem Relation Age of Onset  . Hypertension Mother   . Heart disease Mother   . Hypertension Father   . Heart disease Father   . Hypertension Sister   . Heart disease Sister   . Cancer Sister        BREAST   . Hypertension Brother   . Heart disease Brother     Health Maintenance  Topic Date Due  . FOOT EXAM  09/07/2018 (Originally 05/12/2018)  . OPHTHALMOLOGY EXAM  09/07/2018 (Originally 05/09/2016)  . URINE MICROALBUMIN  09/10/2018 (Originally 07/11/2018)  . HEMOGLOBIN A1C  02/03/2019  . TETANUS/TDAP  08/14/2023  . INFLUENZA VACCINE  Completed  . DEXA SCAN  Completed  . PNA vac Low Risk Adult  Completed    No Known Allergies  Outpatient Encounter Medications as of 08/10/2018  Medication Sig  . acetaminophen (TYLENOL) 325 MG tablet Take 2 tablets (650 mg total) by mouth every 6 (six) hours as needed for mild pain, moderate pain or fever.  Marland Kitchen. acetaminophen (TYLENOL) 325 MG tablet Take 650 mg by mouth 2 (two) times daily as needed.  Marland Kitchen. aspirin 81 MG chewable tablet Chew 81 mg by mouth daily. Starting 09/04/2018  . aspirin EC 325 MG EC tablet Take 1 tablet (325 mg total) by mouth daily with breakfast.  . clopidogrel (PLAVIX) 75 MG tablet TAKE 1 TABLET (75mg ) BY MOUTH ONCE DAILY.  Marland Kitchen. docusate sodium (COLACE) 100 MG capsule Take 1 capsule (100 mg total) by mouth 2 (two) times daily.  . insulin glargine (LANTUS) 100 UNIT/ML injection Inject 0.1 mLs (10 Units total) into the skin at bedtime.  Marland Kitchen. levothyroxine (SYNTHROID, LEVOTHROID) 50 MCG tablet TAKE 1 TABLET BY MOUTH ONCE DAILY.  Marland Kitchen. lisinopril (PRINIVIL,ZESTRIL) 40 MG tablet TAKE 1 TABLET BY MOUTH ONCE DAILY.  . mirtazapine (REMERON) 15 MG tablet TAKE 1 TABLET BY MOUTH AT BEDTIME.  . Multiple Vitamin (DAILY VITE) TABS TAKE 1 TABLET BY MOUTH ONCE DAILY.  .  pantoprazole (PROTONIX) 40 MG tablet TAKE 1 TABLET BY MOUTH ONCE DAILY.  Marland Kitchen. propranolol (INDERAL) 40 MG tablet TAKE 1 TABLET BY MOUTH TWICE DAILY.  Marland Kitchen. TRADJENTA 5 MG TABS tablet TAKE 1 TABLET BY MOUTH ONCE DAILY.  . [DISCONTINUED] acetaminophen (TYLENOL) 500 MG tablet Take 2 tablets (1,000 mg total) by mouth every 8 (eight) hours for 4 days.  . [DISCONTINUED] feeding supplement, ENSURE ENLIVE, (ENSURE ENLIVE) LIQD Take 237 mLs by mouth daily with breakfast.  . [DISCONTINUED] HYDROcodone-acetaminophen (  NORCO/VICODIN) 5-325 MG tablet Take 1 tablet by mouth every 6 hours as needed for severe pain.   No facility-administered encounter medications on file as of 08/10/2018.      Review of Systems  Constitutional: Positive for activity change, appetite change and fatigue.  HENT: Negative.   Respiratory: Positive for cough.   Cardiovascular: Negative.   Gastrointestinal: Negative.   Genitourinary: Negative.   Musculoskeletal: Positive for arthralgias and myalgias.  Skin: Negative.   Neurological: Negative.   Psychiatric/Behavioral: Negative.     Vitals:   08/10/18 0949  BP: 126/65  Pulse: 83  Resp: 18  Temp: 99.2 F (37.3 C)  TempSrc: Oral   There is no height or weight on file to calculate BMI. Physical Exam Constitutional:      Appearance: Normal appearance.  HENT:     Head: Normocephalic.     Nose: No congestion.     Mouth/Throat:     Mouth: Mucous membranes are moist.     Pharynx: Oropharynx is clear.  Eyes:     Pupils: Pupils are equal, round, and reactive to light.  Neck:     Musculoskeletal: Neck supple.  Cardiovascular:     Rate and Rhythm: Normal rate and regular rhythm.     Heart sounds: No murmur.  Pulmonary:     Effort: Pulmonary effort is normal.     Comments: Bilateral Fine Crackles Abdominal:     General: Abdomen is flat. Bowel sounds are normal. There is no distension.     Palpations: Abdomen is soft.     Tenderness: There is no abdominal tenderness.    Musculoskeletal: Normal range of motion.     Right lower leg: No edema.     Left lower leg: No edema.  Skin:    General: Skin is warm and dry.  Neurological:     Mental Status: She is alert.     Comments: Alert Oriented to time. Didn't know where she is . Knew that she fell and had fracture. No Focal Deficits.Cannot raise UE due to Pain in [place]  Psychiatric:        Mood and Affect: Mood is depressed.     Comments: Starting to Cry when I asked about her Kids.     Labs reviewed: Basic Metabolic Panel: Recent Labs    08/05/18 0226 08/06/18 0702 08/10/18 0700  NA 142 145 142  K 3.6 3.7 3.6  CL 110 113* 109  CO2 26 25 26   GLUCOSE 214* 144* 148*  BUN 24* 21 16  CREATININE 0.97 0.79 0.61  CALCIUM 7.6* 7.7* 8.0*   Liver Function Tests: Recent Labs    11/14/17 2215 03/04/18 1044 06/04/18 1028 08/03/18 0146  AST 15 11 12 20   ALT 12* 9 9 19   ALKPHOS 67  --   --  50  BILITOT 0.4 0.4 0.4 0.6  PROT 6.4* 6.4 6.2 6.4*  ALBUMIN 3.7  --   --  3.6   No results for input(s): LIPASE, AMYLASE in the last 8760 hours. No results for input(s): AMMONIA in the last 8760 hours. CBC: Recent Labs    06/04/18 1028 08/03/18 0146  08/05/18 0226 08/06/18 0702 08/10/18 0700  WBC 9.4 15.1*   < > 7.3 8.1 6.0  NEUTROABS 6,843 12.9*  --   --   --  3.9  HGB 12.9 12.9   < > 6.2* 8.7* 8.9*  HCT 40.9 43.5   < > 20.6* 27.8* 28.8*  MCV 83.6 86.1   < >  86.2 84.5 87.8  PLT 184 149*   < > 91* 96* 143*   < > = values in this interval not displayed.   Cardiac Enzymes: No results for input(s): CKTOTAL, CKMB, CKMBINDEX, TROPONINI in the last 8760 hours. BNP: Invalid input(s): POCBNP Lab Results  Component Value Date   HGBA1C 5.8 (H) 08/04/2018   Lab Results  Component Value Date   TSH 2.52 06/04/2018   No results found for: VITAMINB12 No results found for: FOLATE No results found for: IRON, TIBC, FERRITIN  Imaging and Procedures obtained prior to SNF admission: No results  found.  Assessment/Plan  S/P ORIF of Right Hip Patient WBAT Will start on Full dose aspirin for 4 weeks. Try to avoid stroger Meds for Pain due to h/o Confusion in hospital with Opiates and ultram Follow up with Ortho  Acute metabolic encephalopathy Thought to be due to UTI and narcotics We will continue to monitor patient in the facility   Type 2 diabetes mellitus  On Lantus and Tradjenta blood sugars mostly well controlled A1c was 5.8 in Hospital  Hypothyroidism TSH Normal in 10/10 Same dose of Synthroid  Coronary artery disease  On Aspirin and Propanolol  Essential hypertension Controlled on Propanolol and Lisinopril  History of CVA  Continue Aspirin and Plavix  Acute Blood Loss anemia Start on Iron S/P Transfusion in hospital Dementia Avoid Narcotics Supportive care' On Remeron by her PCP Depression 'Continue Remeron      Family/ staff Communication:   Labs/tests ordered: Total time spent in this patient care encounter was 45_ minutes; greater than 50% of the visit spent counseling patient, reviewing records , Labs and coordinating care for problems addressed at this encounter.

## 2018-08-11 ENCOUNTER — Other Ambulatory Visit: Payer: Self-pay | Admitting: *Deleted

## 2018-08-11 LAB — CULTURE, BLOOD (ROUTINE X 2)
Culture: NO GROWTH
Culture: NO GROWTH
Special Requests: ADEQUATE
Special Requests: ADEQUATE

## 2018-08-11 NOTE — Patient Outreach (Signed)
[facility]) Care Management  08/11/2018  Kristin Grant 10/04/1940 [id number]   Onsite IDT meeting. Patient is fromALF she has history of CHF, DM, and right hip fracture.  Discharge plan is to return to ALF after rehab is completed.  No [facility] care management needs at this time. Plan to sign off, can be re-consulted if any [facility] care [facility] needs develop.  Alben SpittleMary E. Albertha GheeNiemczura, RN, BSN, CCM  Post Acute Chartered loss adjusterCare Coordinator [facility] [phone]) Business Cell  [phone]) Toll Free Office

## 2018-08-14 ENCOUNTER — Ambulatory Visit (INDEPENDENT_AMBULATORY_CARE_PROVIDER_SITE_OTHER): Payer: Medicare Other | Admitting: Orthopaedic Surgery

## 2018-08-14 ENCOUNTER — Encounter (INDEPENDENT_AMBULATORY_CARE_PROVIDER_SITE_OTHER): Payer: Self-pay | Admitting: Orthopaedic Surgery

## 2018-08-14 ENCOUNTER — Inpatient Hospital Stay (INDEPENDENT_AMBULATORY_CARE_PROVIDER_SITE_OTHER): Payer: No Typology Code available for payment source

## 2018-08-14 VITALS — Ht 64.0 in | Wt 134.0 lb

## 2018-08-14 DIAGNOSIS — M25551 Pain in right hip: Secondary | ICD-10-CM

## 2018-08-14 DIAGNOSIS — S72141A Displaced intertrochanteric fracture of right femur, initial encounter for closed fracture: Secondary | ICD-10-CM

## 2018-08-14 NOTE — Progress Notes (Signed)
Post-Op Visit Note   Patient: Kristin Grant           Date of Birth: Oct 28, 1940           MRN: [id number] Visit Date: 08/14/2018 PCP: Salley Scarleturham, Kawanta F, MD   Assessment & Plan: Post right trochanteric nail with interlock.  Staples removed x-rays look good.  Weightbearing as tolerated return 1 month.  Chief Complaint:  Chief Complaint  Patient presents with  . Right Hip - Routine Post Op    08/03/18 IM Nail Right Intertroch   Visit Diagnoses:  1. Pain in right hip   2. Closed intertrochanteric fracture of hip, right, initial encounter [facility](HCC)     Plan: Return 1 month for final x-rays.  Follow-Up Instructions: Return in about 1 month (around 09/14/2018).   Orders:  Orders Placed This Encounter  Procedures  . XR HIP UNILAT W OR W/O PELVIS 2-3 VIEWS RIGHT   No orders of the defined types were placed in this encounter.   Imaging: Xr Hip Unilat W Or W/o Pelvis 2-3 Views Right  Result Date: 08/14/2018 2 view x-rays right hip demonstrates truck nail with proximal distal interlock in good position and alignment. Impression: Satisfactory truck nail in good position.  Staples have been harvested.   PMFS History: Patient Active Problem List   Diagnosis Date Noted  . Acute blood loss anemia 08/05/2018  . Closed intertrochanteric fracture of hip, right, initial encounter (HCC) 08/03/2018  . Malnutrition of moderate degree 08/03/2018  . Coronary artery disease 11/17/2017  . Thrombocytopenia (HCC) 11/17/2017  . Right arm fracture 11/17/2017  . Cerebral atrophy (HCC) 11/17/2017  . Hypokalemia 11/17/2017  . History of CVA (cerebrovascular accident) 11/15/2017  . Suspected stroke patient last known to be well 3 to 4.5 hours ago   . Closed fracture of proximal end of right humerus 09/18/17 10/16/2017  . Osteoporosis 01/09/2017  . Loss of weight 01/12/2016  . Stroke-like episode (HCC) s/p IV tPA 11/22/2015  . Diabetes mellitus, type II (HCC) 06/22/2014  . Protein-calorie  malnutrition (HCC) 12/15/2013  . Fall 08/17/2013  . Back pain 08/17/2013  . Pressure ulcer, stage 1 07/06/2013  . Abnormal development of nail 03/28/2013  . Gait abnormality 12/10/2011  . Dementia (HCC) 11/21/2011  . Hypertension 11/21/2011  . Hypothyroidism 11/21/2011   Past Medical History:  Diagnosis Date  . Alzheimer disease (HCC)   . Arthritis   . Coronary artery disease   . Diabetes mellitus   . Difficulty walking   . Hypertension   . Reflux   . Stroke [facility](HCC)     Family History  Problem Relation Age of Onset  . Hypertension Mother   . Heart disease Mother   . Hypertension Father   . Heart disease Father   . Hypertension Sister   . Heart disease Sister   . Cancer Sister        BREAST   . Hypertension Brother   . Heart disease Brother     Past Surgical History:  Procedure Laterality Date  . APPENDECTOMY    . FINGER DEBRIDEMENT    . INTRAMEDULLARY (IM) NAIL INTERTROCHANTERIC Right 08/03/2018   Procedure: INTRAMEDULLARY (IM) NAIL INTERTROCHANTRIC;  Surgeon: Eldred MangesYates, Audrina Marten C, MD;  Location: [facility] OR;  Service: Orthopedics;  Laterality: Right;   Social History   Occupational History  . Not on file  Tobacco Use  . Smoking status: Never Smoker  . Smokeless tobacco: Never Used  Substance and Sexual Activity  . Alcohol use: No  .  Drug use: No  . Sexual activity: Not on file

## 2018-08-17 ENCOUNTER — Encounter (HOSPITAL_COMMUNITY)
Admission: RE | Admit: 2018-08-17 | Discharge: 2018-08-17 | Disposition: A | Discharge status: Home / Self Care | Payer: Medicare Other | Source: Skilled Nursing Facility | Attending: Internal Medicine | Admitting: Internal Medicine

## 2018-08-17 DIAGNOSIS — D62 Acute posthemorrhagic anemia: Secondary | ICD-10-CM | POA: Insufficient documentation

## 2018-08-17 DIAGNOSIS — Z9181 History of falling: Secondary | ICD-10-CM | POA: Insufficient documentation

## 2018-08-17 DIAGNOSIS — E119 Type 2 diabetes mellitus without complications: Secondary | ICD-10-CM | POA: Insufficient documentation

## 2018-08-17 DIAGNOSIS — E039 Hypothyroidism, unspecified: Secondary | ICD-10-CM | POA: Insufficient documentation

## 2018-08-17 DIAGNOSIS — Z4789 Encounter for other orthopedic aftercare: Secondary | ICD-10-CM | POA: Insufficient documentation

## 2018-08-17 DIAGNOSIS — I10 Essential (primary) hypertension: Secondary | ICD-10-CM | POA: Insufficient documentation

## 2018-08-17 LAB — BASIC METABOLIC PANEL
ANION GAP: 6 (ref 5–15)
BUN: 16 mg/dL (ref 8–23)
CO2: 28 mmol/L (ref 22–32)
Calcium: 8.7 mg/dL — ABNORMAL LOW (ref 8.9–10.3)
Chloride: 105 mmol/L (ref 98–111)
Creatinine, Ser: 0.55 mg/dL (ref 0.44–1.00)
GFR calc Af Amer: >60 mL/min (ref >60)
GFR calc non Af Amer: >60 mL/min (ref >60)
GLUCOSE: 202 mg/dL — AB (ref 70–99)
Potassium: 3.8 mmol/L (ref 3.5–5.1)
Sodium: 139 mmol/L (ref 135–145)

## 2018-08-17 LAB — CBC
HCT: 34.2 % — ABNORMAL LOW (ref 36.0–46.0)
Hemoglobin: 10.2 g/dL — ABNORMAL LOW (ref 12.0–15.0)
MCH: 26.3 pg (ref 26.0–34.0)
MCHC: 29.8 g/dL — ABNORMAL LOW (ref 30.0–36.0)
MCV: 88.1 fL (ref 80.0–100.0)
Platelets: 173 10*3/uL (ref 150–400)
RBC: 3.88 MIL/uL (ref 3.87–5.11)
RDW: 16.5 % — ABNORMAL HIGH (ref 11.5–15.5)
WBC: 8.5 10*3/uL (ref 4.0–10.5)
nRBC: 0 % (ref 0.0–0.2)

## 2018-08-18 ENCOUNTER — Non-Acute Institutional Stay (SKILLED_NURSING_FACILITY): Payer: Medicare Other | Admitting: Internal Medicine

## 2018-08-18 ENCOUNTER — Encounter: Payer: Self-pay | Admitting: Internal Medicine

## 2018-08-18 DIAGNOSIS — E1100 Type 2 diabetes mellitus with hyperosmolarity without nonketotic hyperglycemic-hyperosmolar coma (NKHHC): Secondary | ICD-10-CM

## 2018-08-18 DIAGNOSIS — I10 Essential (primary) hypertension: Secondary | ICD-10-CM | POA: Diagnosis not present

## 2018-08-18 DIAGNOSIS — I2510 Atherosclerotic heart disease of native coronary artery without angina pectoris: Secondary | ICD-10-CM

## 2018-08-18 NOTE — Progress Notes (Signed)
Location:    [facility] Room Number: 115/D Place of Service:  SNF (31) Provider: Einar CrowAnjali  MD  Salley Scarleturham, Kawanta F, MD  Patient Care Team: Salley Scarleturham, Kawanta F, MD as PCP - General [facility](Family Medicine)  Extended Emergency Contact Information Primary Emergency Contact: Tharon AquasGoins,Richard          [place], [place] Phone: [phone] Relation: Son Secondary Emergency Contact: Lynwood DawleyZorn, Ann Mobile Phone: [phone] Relation: Daughter  Code Status:  Full Code Goals of care: Advanced Directive information Advanced Directives 08/18/2018  Does Patient Have a Medical Advance Directive? Yes  Type of Advance Directive (No Data)  Does patient want to make changes to medical advance directive? No - Patient declined  Would patient like information on creating a medical advance directive? No - Patient declined  Pre-existing out of facility DNR order (yellow form or pink MOST form) -     Chief Complaint  Patient presents with  . Acute Visit    BS Management    HPI:  Pt is a 77 y.o. female seen today for an acute visit for BS running more then 300  She stayed in the hospital from 12/08-12/12 For Right Hip Fracture after the Fall.  Patient has a history of Alzheimer's disease, CAD, diabetes type 2, Hypertension, history of CVA and GERD Patient had a mechanical fall at the facility that she resides.  She was found to have Right hip mildly displaced intertrochanteric fracture. She underwent intramedullary nail with Interlock on 12/09. Post op patient needed 2 units of PRBC SHe also had acute metabolic encephalopathy thought to be due to UTI though her cultures were negative.  She was treated with antibiotics.  Also to avoid pain medicines which were causing confusion. Patient is in SNF for therapy.  Her BS have been running more then 200 in the morning and More then 300 at night Per Nurses she has been Non Complaint with her Diet Her pain is  better controlled now. She is working with therapy and walking with Environmental consultantWalker. No Fever , Chills, SOB or Cough  Past Medical History:  Diagnosis Date  . Alzheimer disease (HCC)   . Arthritis   . Coronary artery disease   . Diabetes mellitus   . Difficulty walking   . Hypertension   . Reflux   . Stroke [facility](HCC)    Past Surgical History:  Procedure Laterality Date  . APPENDECTOMY    . FINGER DEBRIDEMENT    . INTRAMEDULLARY (IM) NAIL INTERTROCHANTERIC Right 08/03/2018   Procedure: INTRAMEDULLARY (IM) NAIL INTERTROCHANTRIC;  Surgeon: Eldred MangesYates, Mark C, MD;  Location: [facility] OR;  Service: Orthopedics;  Laterality: Right;    No Known Allergies  Outpatient Encounter Medications as of 08/18/2018  Medication Sig  . acetaminophen (TYLENOL) 325 MG tablet Take 2 tablets (650 mg total) by mouth every 6 (six) hours as needed for mild pain, moderate pain or fever.  Marland Kitchen. acetaminophen (TYLENOL) 325 MG tablet Take 650 mg by mouth 2 (two) times daily as needed.  Marland Kitchen. aspirin 81 MG chewable tablet Chew 81 mg by mouth daily. Starting 09/04/2018  . aspirin EC 325 MG EC tablet Take 1 tablet (325 mg total) by mouth daily with breakfast.  . clopidogrel (PLAVIX) 75 MG tablet TAKE 1 TABLET (75mg ) BY MOUTH ONCE DAILY.  Marland Kitchen. docusate sodium (COLACE) 100 MG capsule Take 1 capsule (100 mg total) by mouth 2 (two) times daily.  . Ferrous Sulfate (IRON) 325 (65 Fe) MG TABS Take 1  tablet by mouth once a day  . insulin glargine (LANTUS) 100 UNIT/ML injection Inject 0.1 mLs (10 Units total) into the skin at bedtime.  Marland Kitchen levothyroxine (SYNTHROID, LEVOTHROID) 50 MCG tablet TAKE 1 TABLET BY MOUTH ONCE DAILY.  Marland Kitchen lisinopril (PRINIVIL,ZESTRIL) 40 MG tablet TAKE 1 TABLET BY MOUTH ONCE DAILY.  . mirtazapine (REMERON) 15 MG tablet TAKE 1 TABLET BY MOUTH AT BEDTIME.  . Multiple Vitamin (DAILY VITE) TABS TAKE 1 TABLET BY MOUTH ONCE DAILY.  . pantoprazole (PROTONIX) 40 MG tablet TAKE 1 TABLET BY MOUTH ONCE DAILY.  Marland Kitchen propranolol (INDERAL) 40 MG tablet  TAKE 1 TABLET BY MOUTH TWICE DAILY.  Marland Kitchen TRADJENTA 5 MG TABS tablet TAKE 1 TABLET BY MOUTH ONCE DAILY.   No facility-administered encounter medications on file as of 08/18/2018.      Review of Systems  Review of Systems  Constitutional: Negative for activity change, appetite change, chills, diaphoresis, fatigue and fever.  HENT: Negative for mouth sores, postnasal drip, rhinorrhea, sinus pain and sore throat.   Respiratory: Negative for apnea, cough, chest tightness, shortness of breath and wheezing.   Cardiovascular: Negative for chest pain, palpitations and leg swelling.  Gastrointestinal: Negative for abdominal distention, abdominal pain, constipation, diarrhea, nausea and vomiting.  Genitourinary: Negative for dysuria and frequency.  Musculoskeletal: Negative for arthralgias, joint swelling and myalgias.  Skin: Negative for rash.  Neurological: Negative for dizziness, syncope, weakness, light-headedness and numbness.  Psychiatric/Behavioral: Negative for behavioral problems, confusion and sleep disturbance.     Immunization History  Administered Date(s) Administered  . Influenza Split 06/30/2012  . Influenza, High Dose Seasonal PF 06/04/2018  . Influenza,inj,Quad PF,6+ Mos 07/05/2013, 06/14/2015, 05/17/2016  . Influenza-Unspecified 06/06/2014  . PPD Test 07/19/2013  . Pneumococcal Conjugate-13 08/16/2013  . Tdap 08/13/2013   Pertinent  Health Maintenance Due  Topic Date Due  . FOOT EXAM  09/07/2018 (Originally 05/12/2018)  . OPHTHALMOLOGY EXAM  09/07/2018 (Originally 05/09/2016)  . URINE MICROALBUMIN  09/10/2018 (Originally 07/11/2018)  . HEMOGLOBIN A1C  02/03/2019  . INFLUENZA VACCINE  Completed  . DEXA SCAN  Completed  . PNA vac Low Risk Adult  Completed   Fall Risk  03/26/2018 03/04/2018 07/11/2017 01/01/2017 01/12/2016  Falls in the past year? No Yes No Yes No  Number falls in past yr: - 2 or more - 2 or more -  Injury with Fall? - Yes - No -  Comment - - - - -  Risk  Factor Category  - High Fall Risk - - -  Risk for fall due to : - - - - -   Functional Status Survey:    Vitals:   08/18/18 1509  BP: 115/66  Pulse: 83  Resp: 18  Temp: 98.1 F (36.7 C)  TempSrc: Oral   There is no height or weight on file to calculate BMI. Physical Exam Constitutional:      Appearance: Normal appearance.  HENT:     Head: Normocephalic.     Nose: No congestion.     Mouth/Throat:     Mouth: Mucous membranes are moist.     Pharynx: Oropharynx is clear.  Eyes:     Pupils: Pupils are equal, round, and reactive to light.  Neck:     Musculoskeletal: Neck supple.  Cardiovascular:     Rate and Rhythm: Normal rate and regular rhythm.     Heart sounds: No murmur.  Pulmonary:     Effort: Pulmonary effort is normal.     Comments: Bilateral Fine Crackles Abdominal:  General: Abdomen is flat. Bowel sounds are normal. There is no distension.     Palpations: Abdomen is soft.     Tenderness: There is no abdominal tenderness.  Musculoskeletal: Normal range of motion.     Right lower leg: Edema present.     Left lower leg: No edema.     Comments: Mild edema in Right LE  Skin:    General: Skin is warm and dry.  Neurological:     Mental Status: She is alert.     Comments: Alert Oriented to time. Didn't know where she is . Knew that she fell and had fracture. No Focal Deficits.Cannot raise UE due to Pain in [place]  Psychiatric:        Mood and Affect: Mood is depressed.     Labs reviewed: Recent Labs    08/06/18 0702 08/10/18 0700 08/17/18 0740  NA 145 142 139  K 3.7 3.6 3.8  CL 113* 109 105  CO2 25 26 28   GLUCOSE 144* 148* 202*  BUN 21 16 16   CREATININE 0.79 0.61 0.55  CALCIUM 7.7* 8.0* 8.7*   Recent Labs    11/14/17 2215 03/04/18 1044 06/04/18 1028 08/03/18 0146  AST 15 11 12 20   ALT 12* 9 9 19   ALKPHOS 67  --   --  50  BILITOT 0.4 0.4 0.4 0.6  PROT 6.4* 6.4 6.2 6.4*  ALBUMIN 3.7  --   --  3.6   Recent Labs    06/04/18 1028  08/03/18 0146  08/06/18 0702 08/10/18 0700 08/17/18 0740  WBC 9.4 15.1*   < > 8.1 6.0 8.5  NEUTROABS 6,843 12.9*  --   --  3.9  --   HGB 12.9 12.9   < > 8.7* 8.9* 10.2*  HCT 40.9 43.5   < > 27.8* 28.8* 34.2*  MCV 83.6 86.1   < > 84.5 87.8 88.1  PLT 184 149*   < > 96* 143* 173   < > = values in this interval not displayed.   Lab Results  Component Value Date   TSH 2.52 06/04/2018   Lab Results  Component Value Date   HGBA1C 5.8 (H) 08/04/2018   Lab Results  Component Value Date   CHOL 97 11/15/2017   HDL 35 (L) 11/15/2017   LDLCALC 47 11/15/2017   TRIG 75 11/15/2017   CHOLHDL 2.8 11/15/2017    Significant Diagnostic Results in last 30 days:  Dg Chest 1 View  Result Date: 08/03/2018 CLINICAL DATA:  Seven 13-year-old female with fall and right hip pain. EXAM: CHEST  1 VIEW COMPARISON:  Chest radiograph dated 01/17/2017 FINDINGS: Mild chronic interstitial coarsening. No focal consolidation, pleural effusion, or pneumothorax. Minimal left lung base atelectasis/scarring. The cardiac silhouette is within normal limits. Coronary vascular calcifications. No acute osseous pathology. IMPRESSION: No active disease. Electronically Signed   By: Elgie Collard M.D.   On: 08/03/2018 01:26   Dg C-arm 1-60 Min  Result Date: 08/03/2018 CLINICAL DATA:  Intraoperative imaging for fixation of a right hip fracture which the patient suffered a fall last night. Initial encounter. EXAM: OPERATIVE RIGHT HIP (WITH PELVIS IF PERFORMED) to VIEWS TECHNIQUE: Fluoroscopic spot image(s) were submitted for interpretation post-operatively. COMPARISON:  Plain films right hip 08/03/2018. FINDINGS: Hip screw and short intramedullary nail with a single distal screw are in place for fixation of an intertrochanteric fracture. Position and alignment are anatomic. No acute abnormality. IMPRESSION: Intraoperative imaging for fixation of a right intertrochanteric fracture. Electronically Signed  By: Drusilla Kannerhomas  Dalessio M.D.    On: 08/03/2018 17:17   Dg Hip Operative Unilat W Or W/o Pelvis Right  Result Date: 08/03/2018 CLINICAL DATA:  Intraoperative imaging for fixation of a right hip fracture which the patient suffered a fall last night. Initial encounter. EXAM: OPERATIVE RIGHT HIP (WITH PELVIS IF PERFORMED) to VIEWS TECHNIQUE: Fluoroscopic spot image(s) were submitted for interpretation post-operatively. COMPARISON:  Plain films right hip 08/03/2018. FINDINGS: Hip screw and short intramedullary nail with a single distal screw are in place for fixation of an intertrochanteric fracture. Position and alignment are anatomic. No acute abnormality. IMPRESSION: Intraoperative imaging for fixation of a right intertrochanteric fracture. Electronically Signed   By: Drusilla Kannerhomas  Dalessio M.D.   On: 08/03/2018 17:17   Dg Hip Unilat W Or Wo Pelvis 2-3 Views Right  Result Date: 08/03/2018 CLINICAL DATA:  77 year old female with fall and right hip pain. EXAM: DG HIP (WITH OR WITHOUT PELVIS) 2-3V RIGHT COMPARISON:  None. FINDINGS: There is mildly displaced intertrochanteric fracture of the right femoral neck with mild varus angulation. There is advanced osteopenia. No dislocation. Vascular calcifications noted. The soft tissues are unremarkable. IMPRESSION: Mildly displaced intertrochanteric fracture of the right femoral neck. Electronically Signed   By: Elgie CollardArash  Radparvar M.D.   On: 08/03/2018 01:25   Xr Hip Unilat W Or W/o Pelvis 2-3 Views Right  Result Date: 08/14/2018 2 view x-rays right hip demonstrates truck nail with proximal distal interlock in good position and alignment. Impression: Satisfactory truck nail in good position.  Staples have been harvested.   Assessment/Plan  Type 2 diabetes mellitus  On Lantus and Tradjenta BS are running high in facility mostly due to non complaint with her diet Increase her Lantus to 14 units and Novolog Bolus with Meals. A1c was 5.8 in Hospital S/P ORIF of Right Hip Patient WBAT On Full  dose aspirin for 4 weeks. Try to avoid stroger Meds for Pain due to h/o Confusion in hospital with Opiates and ultram Follow up with Ortho Acute metabolic encephalopathy Thought to be due to UTI and narcotics Has been stable in Facility Hypothyroidism TSH Normal in 10/10 Same dose of Synthroid  Coronary artery disease  On Aspirin and Propanolol  Essential hypertension Controlled on Propanolol and Lisinopril  History of CVA  Continue Aspirin and Plavix  Acute Blood Loss anemia Started on Iron S/P Transfusion in hospital Hgb Stable Dementia Avoid Narcotics Supportive care' On Remeron by her PCP Depression 'Continue Remeron       Family/ staff Communication:   Labs/tests ordered:

## 2018-09-04 ENCOUNTER — Ambulatory Visit: Payer: Medicare Other

## 2018-09-07 ENCOUNTER — Encounter: Payer: Self-pay | Admitting: Internal Medicine

## 2018-09-07 ENCOUNTER — Non-Acute Institutional Stay (SKILLED_NURSING_FACILITY): Payer: Medicare Other | Admitting: Internal Medicine

## 2018-09-07 DIAGNOSIS — F0280 Dementia in other diseases classified elsewhere without behavioral disturbance: Secondary | ICD-10-CM | POA: Diagnosis not present

## 2018-09-07 DIAGNOSIS — S72001D Fracture of unspecified part of neck of right femur, subsequent encounter for closed fracture with routine healing: Secondary | ICD-10-CM | POA: Diagnosis not present

## 2018-09-07 DIAGNOSIS — I10 Essential (primary) hypertension: Secondary | ICD-10-CM

## 2018-09-07 DIAGNOSIS — E119 Type 2 diabetes mellitus without complications: Secondary | ICD-10-CM | POA: Diagnosis not present

## 2018-09-07 DIAGNOSIS — D62 Acute posthemorrhagic anemia: Secondary | ICD-10-CM

## 2018-09-07 NOTE — Progress Notes (Signed)
Location:    [facility] Room Number: 115/D Place of Service:  SNF (31) Provider:  Merlyn Lot, MD  Patient Care Team: Salley Scarlet, MD as PCP - General [facility])  Extended Emergency Contact Information Primary Emergency Contact: Tharon Aquas [place], [place] Phone: [phone] Relation: Son Secondary Emergency Contact: Lynwood Dawley [place] Phone: [phone] Relation: Daughter  Code Status:  Full Code Goals of care: Advanced Directive information Advanced Directives 09/07/2018  Does Patient Have a Medical Advance Directive? Yes  Type of Advance Directive (No Data)  Does patient want to make changes to medical advance directive? No - Patient declined  Would patient like information on creating a medical advance directive? No - Patient declined  Pre-existing out of facility DNR order (yellow form or pink MOST form) -     Chief Complaint  Patient presents with  . Discharge Note    Discharge Visit    HPI:  Pt is a 78 y.o. female seen today for an acute visit for discharge from facility tomorrow.  Patient was here for rehab after hospitalization in December for right hip fracture after a fall.  That was surgically repaired.  She also has a history of dementia coronary artery disease type 2 diabetes hypertension history of CVA and GERD.  Patient does live apparently in an assisted living facility and had a fall found to have a right hip mildly displaced intra-chronic fracture.  She underwent IM nailing with interlock on December 9.  Needed 2 units of packed red blood cells secondary to a hemoglobin to she is on iron and last hemoglobin taken December 23 appeared to show improvement at 10.2.  She also had acute metabolic encephalopathy thought secondary to UTI although cultures are negative--she was treated with antibiotics also recommendation to try to avoid narcotics which  thought to be contributing to her confusion.  At this point her pain appears to be controlled with the Tylenol.  She did is a type II diabetic blood sugars appear to be more in the mid 100s in the morning with some more variability with blood sugars occasionally above 200 later in the day- he continues on Lantus 14 units a day as well as Tradjenta 5 mg a day.  She is also on NovoLog 6 units at noon and 5 PM.  Regards to hypertension she continues on lisinopril as well as propanolol blood sugars appear to be fairly stable ranging from the low 100s to around 150 systolically but appears generally she is more in the low 100s-120 systolically I got 104/70 today  Regards coronary artery disease this is been asymptomatic during her stay here she she is on a beta-blocker as well as aspirin.  She also has a history of CVA and is on aspirin as well as Plavix.  Currently she is sitting in her wheelchair comfortably she has no complaints says her pain is controlled/she does have elements of dementia but apparently this is not a change from her previous condition     Past Medical History:  Diagnosis Date  . Alzheimer disease (HCC)   . Arthritis   . Coronary artery disease   . Diabetes mellitus   . Difficulty walking   . Hypertension   . Reflux   . Stroke [facility])    Past Surgical History:  Procedure Laterality Date  . APPENDECTOMY    . FINGER DEBRIDEMENT    .  INTRAMEDULLARY (IM) NAIL INTERTROCHANTERIC Right 08/03/2018   Procedure: INTRAMEDULLARY (IM) NAIL INTERTROCHANTRIC;  Surgeon: Eldred MangesYates, Mark C, MD;  Location: [facility] OR;  Service: Orthopedics;  Laterality: Right;    No Known Allergies  Outpatient Encounter Medications as of 09/07/2018  Medication Sig  . acetaminophen (TYLENOL) 325 MG tablet Take 2 tablets (650 mg total) by mouth every 6 (six) hours as needed for mild pain, moderate pain or fever.  Marland Kitchen. aspirin 81 MG chewable tablet Chew 81 mg by mouth daily. Starting 09/04/2018  . clopidogrel  (PLAVIX) 75 MG tablet TAKE 1 TABLET (75mg ) BY MOUTH ONCE DAILY.  Marland Kitchen. docusate sodium (COLACE) 100 MG capsule Take 1 capsule (100 mg total) by mouth 2 (two) times daily.  . Ferrous Sulfate (IRON) 325 (65 Fe) MG TABS Take 1 tablet by mouth once a day  . insulin aspart (NOVOLOG FLEXPEN) 100 UNIT/ML FlexPen Inject 6 Units into the skin 2 (two) times daily.  . insulin glargine (LANTUS) 100 UNIT/ML injection Inject 14 Units into the skin at bedtime.  Marland Kitchen. levothyroxine (SYNTHROID, LEVOTHROID) 50 MCG tablet TAKE 1 TABLET BY MOUTH ONCE DAILY.  Marland Kitchen. lisinopril (PRINIVIL,ZESTRIL) 40 MG tablet TAKE 1 TABLET BY MOUTH ONCE DAILY.  . mirtazapine (REMERON) 15 MG tablet TAKE 1 TABLET BY MOUTH AT BEDTIME.  . Multiple Vitamin (DAILY VITE) TABS TAKE 1 TABLET BY MOUTH ONCE DAILY.  . pantoprazole (PROTONIX) 40 MG tablet TAKE 1 TABLET BY MOUTH ONCE DAILY.  Marland Kitchen. propranolol (INDERAL) 40 MG tablet TAKE 1 TABLET BY MOUTH TWICE DAILY.  Marland Kitchen. TRADJENTA 5 MG TABS tablet TAKE 1 TABLET BY MOUTH ONCE DAILY.  . [DISCONTINUED] acetaminophen (TYLENOL) 325 MG tablet Take 650 mg by mouth 2 (two) times daily as needed.  . [DISCONTINUED] aspirin EC 325 MG EC tablet Take 1 tablet (325 mg total) by mouth daily with breakfast.  . [DISCONTINUED] insulin glargine (LANTUS) 100 UNIT/ML injection Inject 0.1 mLs (10 Units total) into the skin at bedtime. (Patient taking differently: Inject 14 Units into the skin at bedtime. )   No facility-administered encounter medications on file as of 09/07/2018.     Review of Systems   In general he is not complaining of any fever or chills.  Skin is not complaining of rashes or itching surgical scar right hip appears to be well-healed.  Head ears eyes nose mouth and throat is not complaining of any sore throat or visual changes she has prescription lenses.  Respiratory does not complain of shortness of breath or cough.  Cardiac does not complain of chest pain or palpitations she does not have significant lower  extremity edema.  GI does not complain of nausea vomiting diarrhea or constipation.  GU is not complaining of dysuria.  Musculoskeletal at this point says any pain is controlled does not complain of hip discomfort.  Neurologic is not complaining of dizziness headache or numbness.  And psych does have a history of depression on Remeron also a history of dementia  Immunization History  Administered Date(s) Administered  . Influenza Split 06/30/2012  . Influenza, High Dose Seasonal PF 06/04/2018  . Influenza,inj,Quad PF,6+ Mos 07/05/2013, 06/14/2015, 05/17/2016  . Influenza-Unspecified 06/06/2014  . PPD Test 07/19/2013  . Pneumococcal Conjugate-13 08/16/2013  . Tdap 08/13/2013   Pertinent  Health Maintenance Due  Topic Date Due  . FOOT EXAM  09/07/2018 (Originally 05/12/2018)  . OPHTHALMOLOGY EXAM  09/07/2018 (Originally 05/09/2016)  . URINE MICROALBUMIN  09/10/2018 (Originally 07/11/2018)  . HEMOGLOBIN A1C  02/03/2019  . INFLUENZA VACCINE  Completed  .  DEXA SCAN  Completed  . PNA vac Low Risk Adult  Completed   Fall Risk  03/26/2018 03/04/2018 07/11/2017 01/01/2017 01/12/2016  Falls in the past year? No Yes No Yes No  Number falls in past yr: - 2 or more - 2 or more -  Injury with Fall? - Yes - No -  Comment - - - - -  Risk Factor Category  - High Fall Risk - - -  Risk for fall due to : - - - - -   Functional Status Survey:    Vitals:   09/07/18 1519  BP: (!) 104/59  Pulse: 79  Resp: 18  Temp: (!) 97.5 F (36.4 C)  TempSrc: Oral  SpO2: 95%  Manual blood pressure was 104/70 again systolics appear to be largely in the lower 100s --- Physical Exam   In general this is a pleasant elderly female in no distress sitting comfortably in her wheelchair.  Her skin is warm and dry she does have a well-healed surgical scar right hip.  Eyes visual acuity appears to be intact sclera and conjunctive are clear she has prescription lenses.  Oropharynx is clear mucous membranes  moist.  Chest is clear to auscultation she does have shallow air entry there is no labored breathing.  Heart is regular rate and rhythm without murmur gallop or rub she does not have significant lower extremity edema.  Abdomen is soft nontender with positive bowel sounds.  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  Neurologic she is alert could not really appreciate lateralizing findings her speech is clear.  Psych she is oriented to self could not really tell me the day of week or month said the year was somewhere in the 2000's--she was pleasant cooperative and follows simple verbal commands without any difficulty Labs reviewed: Recent Labs    08/06/18 0702 08/10/18 0700 08/17/18 0740  NA 145 142 139  K 3.7 3.6 3.8  CL 113* 109 105  CO2 25 26 28   GLUCOSE 144* 148* 202*  BUN 21 16 16   CREATININE 0.79 0.61 0.55  CALCIUM 7.7* 8.0* 8.7*   Recent Labs    11/14/17 2215 03/04/18 1044 06/04/18 1028 08/03/18 0146  AST 15 11 12 20   ALT 12* 9 9 19   ALKPHOS 67  --   --  50  BILITOT 0.4 0.4 0.4 0.6  PROT 6.4* 6.4 6.2 6.4*  ALBUMIN 3.7  --   --  3.6   Recent Labs    06/04/18 1028 08/03/18 0146  08/06/18 0702 08/10/18 0700 08/17/18 0740  WBC 9.4 15.1*   < > 8.1 6.0 8.5  NEUTROABS 6,843 12.9*  --   --  3.9  --   HGB 12.9 12.9   < > 8.7* 8.9* 10.2*  HCT 40.9 43.5   < > 27.8* 28.8* 34.2*  MCV 83.6 86.1   < > 84.5 87.8 88.1  PLT 184 149*   < > 96* 143* 173   < > = values in this interval not displayed.   Lab Results  Component Value Date   TSH 2.52 06/04/2018   Lab Results  Component Value Date   HGBA1C 5.8 (H) 08/04/2018   Lab Results  Component Value Date   CHOL 97 11/15/2017   HDL 35 (L) 11/15/2017   LDLCALC 47 11/15/2017   TRIG 75 11/15/2017   CHOLHDL 2.8 11/15/2017  Significant Diagnostic Results in last 30 days:  Xr Hip  Unilat W Or W/o Pelvis 2-3 Views Right  Result Date: 08/14/2018 2 view x-rays right hip demonstrates truck nail with proximal distal interlock in good position and alignment. Impression: Satisfactory truck nail in good position.  Staples have been harvested.   Assessment/Plan  #1 history of right hip fracture with repair she appears to have tolerated this surgery well-she will need continued PT and OT as well as home health support apparently she will be going back to her assisted living facility.  At this point pain appears to be controlled with Tylenol she has finished her course of higher dose aspirin for DVT prophylaxis and now is on aspirin 81 mg daily.  2.-  History of type 2 diabetes continues on Lantus as well as Tradjenta she has NovoLog bolus at noon and dinner this will have to be clarified that this can be accomplished at her facility-hemoglobin A1c was 5.8 in the hospital blood sugars here appear to be more in the mid 100s in the morning with some variability ranging from the 100s-in the 200s later in the day  #3- history of hypertension as noted above this appears to be fairly stable on propanolol and lisinopril.  4.  History of dementia-again she will need assistance at her facility- also recommendation to avoid narcotics secondary to confusion- she is on Remeron for coexistent depression.  5.  History of anemia thought postop with hemoglobin going down to 6.2 this appears to be rebounding nicely with a hemoglobin of 10.2 on lab done on December 23- she is on iron will have this updated tomorrow.  6.  History of CVA she continues on aspirin and Plavix.  7.  History of coronary artery disease this is been essentially asymptomatic during her stay here she is on aspirin and Plavix.  8.  History of GERD she continues on Protonix.  9.  History of hypothyroidism TSH was normal on lab done in [place]- at this point will defer to primary care provider for follow-up.  At this point  will update a CBC and metabolic panel tomorrow.  She will need support at her facility especially in regards to her dementia as well as diabetes-.  Would benefit from PT and OT as well.  [id number]-of note greater than 30 minutes spent on this discharge summary-greater than 50% of time spent coordinating a plan of care for numerous diagnoses

## 2018-09-08 ENCOUNTER — Encounter (HOSPITAL_COMMUNITY)
Admission: RE | Admit: 2018-09-08 | Discharge: 2018-09-08 | Disposition: A | Discharge status: Home / Self Care | Payer: Medicare Other | Source: Skilled Nursing Facility | Attending: Internal Medicine | Admitting: Internal Medicine

## 2018-09-08 ENCOUNTER — Other Ambulatory Visit: Payer: Self-pay | Admitting: Family Medicine

## 2018-09-08 DIAGNOSIS — Z9181 History of falling: Secondary | ICD-10-CM | POA: Insufficient documentation

## 2018-09-08 DIAGNOSIS — E119 Type 2 diabetes mellitus without complications: Secondary | ICD-10-CM | POA: Insufficient documentation

## 2018-09-08 DIAGNOSIS — E039 Hypothyroidism, unspecified: Secondary | ICD-10-CM | POA: Insufficient documentation

## 2018-09-08 DIAGNOSIS — I10 Essential (primary) hypertension: Secondary | ICD-10-CM | POA: Insufficient documentation

## 2018-09-08 DIAGNOSIS — D62 Acute posthemorrhagic anemia: Secondary | ICD-10-CM | POA: Insufficient documentation

## 2018-09-08 DIAGNOSIS — Z4789 Encounter for other orthopedic aftercare: Secondary | ICD-10-CM | POA: Insufficient documentation

## 2018-09-08 LAB — CBC WITH DIFFERENTIAL/PLATELET
Abs Immature Granulocytes: 0.07 10*3/uL (ref 0.00–0.07)
BASOS ABS: 0.1 10*3/uL (ref 0.0–0.1)
Basophils Relative: 1 %
Eosinophils Absolute: 0.3 10*3/uL (ref 0.0–0.5)
Eosinophils Relative: 3 %
HCT: 42 % (ref 36.0–46.0)
Hemoglobin: 12.5 g/dL (ref 12.0–15.0)
Immature Granulocytes: 1 %
Lymphocytes Relative: 20 %
Lymphs Abs: 1.7 10*3/uL (ref 0.7–4.0)
MCH: 26.4 pg (ref 26.0–34.0)
MCHC: 29.8 g/dL — AB (ref 30.0–36.0)
MCV: 88.8 fL (ref 80.0–100.0)
Monocytes Absolute: 0.6 10*3/uL (ref 0.1–1.0)
Monocytes Relative: 7 %
NEUTROS PCT: 68 %
Neutro Abs: 5.7 10*3/uL (ref 1.7–7.7)
Platelets: 130 10*3/uL — ABNORMAL LOW (ref 150–400)
RBC: 4.73 MIL/uL (ref 3.87–5.11)
RDW: 15.5 % (ref 11.5–15.5)
WBC: 8.5 10*3/uL (ref 4.0–10.5)
nRBC: 0 % (ref 0.0–0.2)

## 2018-09-08 LAB — BASIC METABOLIC PANEL
Anion gap: 7 (ref 5–15)
BUN: 21 mg/dL (ref 8–23)
CO2: 28 mmol/L (ref 22–32)
Calcium: 8.6 mg/dL — ABNORMAL LOW (ref 8.9–10.3)
Chloride: 106 mmol/L (ref 98–111)
Creatinine, Ser: 0.7 mg/dL (ref 0.44–1.00)
GFR calc Af Amer: >60 mL/min (ref >60)
GFR calc non Af Amer: >60 mL/min (ref >60)
Glucose, Bld: 101 mg/dL — ABNORMAL HIGH (ref 70–99)
Potassium: 3.9 mmol/L (ref 3.5–5.1)
Sodium: 141 mmol/L (ref 135–145)

## 2018-09-09 DIAGNOSIS — M80051D Age-related osteoporosis with current pathological fracture, right femur, subsequent encounter for fracture with routine healing: Secondary | ICD-10-CM | POA: Diagnosis not present

## 2018-09-09 DIAGNOSIS — I2510 Atherosclerotic heart disease of native coronary artery without angina pectoris: Secondary | ICD-10-CM | POA: Diagnosis not present

## 2018-09-09 DIAGNOSIS — R1312 Dysphagia, oropharyngeal phase: Secondary | ICD-10-CM | POA: Diagnosis not present

## 2018-09-09 DIAGNOSIS — W19XXXD Unspecified fall, subsequent encounter: Secondary | ICD-10-CM | POA: Diagnosis not present

## 2018-09-09 DIAGNOSIS — Z794 Long term (current) use of insulin: Secondary | ICD-10-CM | POA: Diagnosis not present

## 2018-09-09 DIAGNOSIS — F0280 Dementia in other diseases classified elsewhere without behavioral disturbance: Secondary | ICD-10-CM | POA: Diagnosis not present

## 2018-09-09 DIAGNOSIS — I10 Essential (primary) hypertension: Secondary | ICD-10-CM | POA: Diagnosis not present

## 2018-09-09 DIAGNOSIS — E1149 Type 2 diabetes mellitus with other diabetic neurological complication: Secondary | ICD-10-CM | POA: Diagnosis not present

## 2018-09-09 DIAGNOSIS — F0150 Vascular dementia without behavioral disturbance: Secondary | ICD-10-CM | POA: Diagnosis not present

## 2018-09-09 DIAGNOSIS — I69318 Other symptoms and signs involving cognitive functions following cerebral infarction: Secondary | ICD-10-CM | POA: Diagnosis not present

## 2018-09-09 DIAGNOSIS — G309 Alzheimer's disease, unspecified: Secondary | ICD-10-CM | POA: Diagnosis not present

## 2018-09-09 DIAGNOSIS — I69391 Dysphagia following cerebral infarction: Secondary | ICD-10-CM | POA: Diagnosis not present

## 2018-09-10 ENCOUNTER — Ambulatory Visit (INDEPENDENT_AMBULATORY_CARE_PROVIDER_SITE_OTHER): Payer: Medicare Other | Admitting: Orthopaedic Surgery

## 2018-09-11 DIAGNOSIS — F0150 Vascular dementia without behavioral disturbance: Secondary | ICD-10-CM | POA: Diagnosis not present

## 2018-09-11 DIAGNOSIS — I69318 Other symptoms and signs involving cognitive functions following cerebral infarction: Secondary | ICD-10-CM | POA: Diagnosis not present

## 2018-09-11 DIAGNOSIS — I2510 Atherosclerotic heart disease of native coronary artery without angina pectoris: Secondary | ICD-10-CM | POA: Diagnosis not present

## 2018-09-11 DIAGNOSIS — F0280 Dementia in other diseases classified elsewhere without behavioral disturbance: Secondary | ICD-10-CM | POA: Diagnosis not present

## 2018-09-11 DIAGNOSIS — G309 Alzheimer's disease, unspecified: Secondary | ICD-10-CM | POA: Diagnosis not present

## 2018-09-11 DIAGNOSIS — M80051D Age-related osteoporosis with current pathological fracture, right femur, subsequent encounter for fracture with routine healing: Secondary | ICD-10-CM | POA: Diagnosis not present

## 2018-09-12 DIAGNOSIS — F0280 Dementia in other diseases classified elsewhere without behavioral disturbance: Secondary | ICD-10-CM | POA: Diagnosis not present

## 2018-09-12 DIAGNOSIS — I2510 Atherosclerotic heart disease of native coronary artery without angina pectoris: Secondary | ICD-10-CM | POA: Diagnosis not present

## 2018-09-12 DIAGNOSIS — G309 Alzheimer's disease, unspecified: Secondary | ICD-10-CM | POA: Diagnosis not present

## 2018-09-12 DIAGNOSIS — I69318 Other symptoms and signs involving cognitive functions following cerebral infarction: Secondary | ICD-10-CM | POA: Diagnosis not present

## 2018-09-12 DIAGNOSIS — M80051D Age-related osteoporosis with current pathological fracture, right femur, subsequent encounter for fracture with routine healing: Secondary | ICD-10-CM | POA: Diagnosis not present

## 2018-09-12 DIAGNOSIS — F0150 Vascular dementia without behavioral disturbance: Secondary | ICD-10-CM | POA: Diagnosis not present

## 2018-09-15 DIAGNOSIS — M80051D Age-related osteoporosis with current pathological fracture, right femur, subsequent encounter for fracture with routine healing: Secondary | ICD-10-CM | POA: Diagnosis not present

## 2018-09-15 DIAGNOSIS — F0280 Dementia in other diseases classified elsewhere without behavioral disturbance: Secondary | ICD-10-CM | POA: Diagnosis not present

## 2018-09-15 DIAGNOSIS — I69318 Other symptoms and signs involving cognitive functions following cerebral infarction: Secondary | ICD-10-CM | POA: Diagnosis not present

## 2018-09-15 DIAGNOSIS — F0150 Vascular dementia without behavioral disturbance: Secondary | ICD-10-CM | POA: Diagnosis not present

## 2018-09-15 DIAGNOSIS — G309 Alzheimer's disease, unspecified: Secondary | ICD-10-CM | POA: Diagnosis not present

## 2018-09-15 DIAGNOSIS — I2510 Atherosclerotic heart disease of native coronary artery without angina pectoris: Secondary | ICD-10-CM | POA: Diagnosis not present

## 2018-09-16 DIAGNOSIS — M80051D Age-related osteoporosis with current pathological fracture, right femur, subsequent encounter for fracture with routine healing: Secondary | ICD-10-CM | POA: Diagnosis not present

## 2018-09-16 DIAGNOSIS — I69318 Other symptoms and signs involving cognitive functions following cerebral infarction: Secondary | ICD-10-CM | POA: Diagnosis not present

## 2018-09-16 DIAGNOSIS — G309 Alzheimer's disease, unspecified: Secondary | ICD-10-CM | POA: Diagnosis not present

## 2018-09-16 DIAGNOSIS — I2510 Atherosclerotic heart disease of native coronary artery without angina pectoris: Secondary | ICD-10-CM | POA: Diagnosis not present

## 2018-09-16 DIAGNOSIS — F0280 Dementia in other diseases classified elsewhere without behavioral disturbance: Secondary | ICD-10-CM | POA: Diagnosis not present

## 2018-09-16 DIAGNOSIS — F0150 Vascular dementia without behavioral disturbance: Secondary | ICD-10-CM | POA: Diagnosis not present

## 2018-09-17 DIAGNOSIS — F0150 Vascular dementia without behavioral disturbance: Secondary | ICD-10-CM | POA: Diagnosis not present

## 2018-09-17 DIAGNOSIS — M80051D Age-related osteoporosis with current pathological fracture, right femur, subsequent encounter for fracture with routine healing: Secondary | ICD-10-CM | POA: Diagnosis not present

## 2018-09-17 DIAGNOSIS — F0280 Dementia in other diseases classified elsewhere without behavioral disturbance: Secondary | ICD-10-CM | POA: Diagnosis not present

## 2018-09-17 DIAGNOSIS — G309 Alzheimer's disease, unspecified: Secondary | ICD-10-CM | POA: Diagnosis not present

## 2018-09-17 DIAGNOSIS — I69318 Other symptoms and signs involving cognitive functions following cerebral infarction: Secondary | ICD-10-CM | POA: Diagnosis not present

## 2018-09-17 DIAGNOSIS — I2510 Atherosclerotic heart disease of native coronary artery without angina pectoris: Secondary | ICD-10-CM | POA: Diagnosis not present

## 2018-09-18 DIAGNOSIS — F0150 Vascular dementia without behavioral disturbance: Secondary | ICD-10-CM | POA: Diagnosis not present

## 2018-09-18 DIAGNOSIS — F0280 Dementia in other diseases classified elsewhere without behavioral disturbance: Secondary | ICD-10-CM | POA: Diagnosis not present

## 2018-09-18 DIAGNOSIS — M80051D Age-related osteoporosis with current pathological fracture, right femur, subsequent encounter for fracture with routine healing: Secondary | ICD-10-CM | POA: Diagnosis not present

## 2018-09-18 DIAGNOSIS — G309 Alzheimer's disease, unspecified: Secondary | ICD-10-CM | POA: Diagnosis not present

## 2018-09-18 DIAGNOSIS — I69318 Other symptoms and signs involving cognitive functions following cerebral infarction: Secondary | ICD-10-CM | POA: Diagnosis not present

## 2018-09-18 DIAGNOSIS — I2510 Atherosclerotic heart disease of native coronary artery without angina pectoris: Secondary | ICD-10-CM | POA: Diagnosis not present

## 2018-09-21 ENCOUNTER — Inpatient Hospital Stay: Payer: Medicare Other | Admitting: Family Medicine

## 2018-09-23 ENCOUNTER — Other Ambulatory Visit: Payer: Self-pay

## 2018-09-23 ENCOUNTER — Ambulatory Visit (INDEPENDENT_AMBULATORY_CARE_PROVIDER_SITE_OTHER): Payer: Medicare Other | Admitting: Family Medicine

## 2018-09-23 ENCOUNTER — Encounter: Payer: Self-pay | Admitting: Family Medicine

## 2018-09-23 VITALS — BP 136/68 | HR 82 | Temp 98.3°F | Resp 16 | Wt 122.8 lb

## 2018-09-23 DIAGNOSIS — F0150 Vascular dementia without behavioral disturbance: Secondary | ICD-10-CM | POA: Diagnosis not present

## 2018-09-23 DIAGNOSIS — E43 Unspecified severe protein-calorie malnutrition: Secondary | ICD-10-CM

## 2018-09-23 DIAGNOSIS — I10 Essential (primary) hypertension: Secondary | ICD-10-CM | POA: Diagnosis not present

## 2018-09-23 DIAGNOSIS — M8000XS Age-related osteoporosis with current pathological fracture, unspecified site, sequela: Secondary | ICD-10-CM

## 2018-09-23 DIAGNOSIS — M80051D Age-related osteoporosis with current pathological fracture, right femur, subsequent encounter for fracture with routine healing: Secondary | ICD-10-CM | POA: Diagnosis not present

## 2018-09-23 DIAGNOSIS — I69318 Other symptoms and signs involving cognitive functions following cerebral infarction: Secondary | ICD-10-CM | POA: Diagnosis not present

## 2018-09-23 DIAGNOSIS — J209 Acute bronchitis, unspecified: Secondary | ICD-10-CM | POA: Diagnosis not present

## 2018-09-23 DIAGNOSIS — I2510 Atherosclerotic heart disease of native coronary artery without angina pectoris: Secondary | ICD-10-CM | POA: Diagnosis not present

## 2018-09-23 DIAGNOSIS — E039 Hypothyroidism, unspecified: Secondary | ICD-10-CM | POA: Diagnosis not present

## 2018-09-23 DIAGNOSIS — E1100 Type 2 diabetes mellitus with hyperosmolarity without nonketotic hyperglycemic-hyperosmolar coma (NKHHC): Secondary | ICD-10-CM | POA: Diagnosis not present

## 2018-09-23 DIAGNOSIS — Z8673 Personal history of transient ischemic attack (TIA), and cerebral infarction without residual deficits: Secondary | ICD-10-CM | POA: Diagnosis not present

## 2018-09-23 DIAGNOSIS — G309 Alzheimer's disease, unspecified: Secondary | ICD-10-CM | POA: Diagnosis not present

## 2018-09-23 DIAGNOSIS — F0280 Dementia in other diseases classified elsewhere without behavioral disturbance: Secondary | ICD-10-CM | POA: Diagnosis not present

## 2018-09-23 MED ORDER — GUAIFENESIN-DM 100-10 MG/5ML PO SYRP: 5.0000 mL | ORAL_SOLUTION | Freq: Four times a day (QID) | ORAL | 0 refills | Status: DC | PRN

## 2018-09-23 MED ORDER — AZITHROMYCIN 250 MG PO TABS: ORAL_TABLET | ORAL | 0 refills | Status: DC

## 2018-09-23 MED ORDER — ALBUTEROL SULFATE HFA 108 (90 BASE) MCG/ACT IN AERS: 2.0000 | INHALATION_SPRAY | Freq: Four times a day (QID) | RESPIRATORY_TRACT | 0 refills | Status: DC | PRN

## 2018-09-23 MED ORDER — ALBUTEROL SULFATE (2.5 MG/3ML) 0.083% IN NEBU
2.5000 mg | INHALATION_SOLUTION | Freq: Once | RESPIRATORY_TRACT | Status: AC
  Administered 2018-09-23: 2.5 mg via RESPIRATORY_TRACT

## 2018-09-23 MED ORDER — DENOSUMAB 60 MG/ML ~~LOC~~ SOSY
60.0000 mg | PREFILLED_SYRINGE | Freq: Once | SUBCUTANEOUS | Status: AC
  Administered 2018-09-23: 60 mg via SUBCUTANEOUS

## 2018-09-23 NOTE — Patient Instructions (Addendum)
antibiotics robitussin DM given  F/U 6 weeks, bring blood sugar readings

## 2018-09-23 NOTE — Assessment & Plan Note (Signed)
Terms of her blood sugars are very difficult to understand as her A1c was last at 5.8%.  We will recheck her A1c today and not to make any changes to her Lantus 14 units or the 6 units with lunch and dinner.  Last week her blood sugars have been all less than 200s

## 2018-09-23 NOTE — Assessment & Plan Note (Signed)
Thyroid function at goal no changes 

## 2018-09-23 NOTE — Progress Notes (Signed)
Subjective:    Patient ID: Kristin Grant, female    DOB: Sep 30, 1940, 78 y.o.   MRN: [id number]  Patient presents for Follow-up (return to ALF from Rehab) and Injections (Prolia)   Pt here for [facility] and hospital follow up, she fell resulting in right hip fracture     - no requring any pain medication, has tyelnol as needed but she does not ask for it     - currently getting PT at her facility [facility]     - Asa 81mg  for DVT prevention    Cough without production, care givers  Noticed some ratteling in chest. No wheezing, no fever, no wheezing.     DM- last A1C 5.8%, currently on Lantus 14 units and Humalog 6 units at noon and 5pm   And also tradjenta   CBG last week Fasting 96-140'S Avg, Lunch 118-377 , 5pm 144-463, bedtime 103-343 She states that her appetite is good however she has lost 12 pounds over the past couple months   HTN- taking BP meds no concerns  Hypothyrodism- TSH normal 3 months ago    Dementia-she has not had any behavioral problems at the nursing home or her current assisted living facility.  Review Of Systems: unable to obtain complete ROS  GEN- denies fatigue, fever, +weight loss,weakness, recent illness HEENT- denies eye drainage, change in vision, nasal discharge, CVS- denies chest pain, palpitations RESP- denies SOB, +cough, wheeze ABD- denies N/V, change in stools, abd pain GU- denies dysuria, hematuria, dribbling, incontinence MSK- denies joint pain, muscle aches, injury Neuro- denies headache, dizziness, syncope, seizure activity       Objective:    BP 136/68   Pulse 82   Temp 98.3 F (36.8 C) (Oral)   Resp 16   Wt 122 lb 12.8 oz (55.7 kg) Comment: in wheelchair  SpO2 98%   BMI 21.08 kg/m  GEN- NAD, alert and oriented x3,sitting in wheelchair HEENT- PERRL, EOMI, non injected sclera, pink conjunctiva, MMM, oropharynx clear, nares clear, TM clear bialt, no sinus tenderness  Neck- Supple, no thyromegaly, no LAD CVS- RRR, no  murmur RESP-rhonchi biat, wheezing expiratory, normal WOB at rest  ABD-NABS,soft,NT,ND EXT- No edema MSK- Decreased ROM HIPS/KNEES Pulses- Radial, DP- 2+        Assessment & Plan:      Problem List Items Addressed This Visit      Unprioritized   Coronary artery disease - Primary (Chronic)    No chest pain, BP controlled , on plavix with stroke history as well       Diabetes mellitus, type II (HCC) (Chronic)    Terms of her blood sugars are very difficult to understand as her A1c was last at 5.8%.  We will recheck her A1c today and not to make any changes to her Lantus 14 units or the 6 units with lunch and dinner.  Last week her blood sugars have been all less than 200s      Relevant Orders   Hemoglobin A1c   History of CVA (cerebrovascular accident)   Hypertension (Chronic)   Relevant Orders   Basic metabolic panel   CBC with Differential/Platelet   Hypothyroidism (Chronic)    Thyroid function at goal no changes.      Osteoporosis    Prolia injection given.      Relevant Medications   denosumab (PROLIA) injection 60 mg (Completed)   Protein-calorie malnutrition (HCC)    4 and weight loss after hospitalization for hip fracture and she was  in a rehab facility her appetite has improved per the aide that is with her today.  She unfortunately cannot afford daily boost or Ensure but they are providing protein rich meals.  We will recheck her weight in 6 weeks.  She is on Remeron 15 mg at bedtime       Other Visit Diagnoses    Acute bronchitis, unspecified organism       Concern for probable overlying community-acquired pneumonia she has been in the hospital been a nursing facility now in an ALF she did have improvement in her wheezing with the albuterol neb.  We will give her azithromycin albuterol inhaler and Robitussin-DM she does not have any current fever and her work of breathing is good   Relevant Medications   albuterol (PROVENTIL) (2.5 MG/3ML) 0.083% nebulizer  solution 2.5 mg (Completed)      Note: This dictation was prepared with Dragon dictation along with smaller phrase technology. Any transcriptional errors that result from this process are unintentional.

## 2018-09-23 NOTE — Assessment & Plan Note (Signed)
4 and weight loss after hospitalization for hip fracture and she was in a rehab facility her appetite has improved per the aide that is with her today.  She unfortunately cannot afford daily boost or Ensure but they are providing protein rich meals.  We will recheck her weight in 6 weeks.  She is on Remeron 15 mg at bedtime

## 2018-09-23 NOTE — Assessment & Plan Note (Signed)
No chest pain, BP controlled , on plavix with stroke history as well

## 2018-09-23 NOTE — Assessment & Plan Note (Signed)
Prolia injection given.

## 2018-09-24 DIAGNOSIS — F0280 Dementia in other diseases classified elsewhere without behavioral disturbance: Secondary | ICD-10-CM | POA: Diagnosis not present

## 2018-09-24 DIAGNOSIS — M80051D Age-related osteoporosis with current pathological fracture, right femur, subsequent encounter for fracture with routine healing: Secondary | ICD-10-CM | POA: Diagnosis not present

## 2018-09-24 DIAGNOSIS — F0150 Vascular dementia without behavioral disturbance: Secondary | ICD-10-CM | POA: Diagnosis not present

## 2018-09-24 DIAGNOSIS — I69318 Other symptoms and signs involving cognitive functions following cerebral infarction: Secondary | ICD-10-CM | POA: Diagnosis not present

## 2018-09-24 DIAGNOSIS — I2510 Atherosclerotic heart disease of native coronary artery without angina pectoris: Secondary | ICD-10-CM | POA: Diagnosis not present

## 2018-09-24 DIAGNOSIS — G309 Alzheimer's disease, unspecified: Secondary | ICD-10-CM | POA: Diagnosis not present

## 2018-09-24 LAB — CBC WITH DIFFERENTIAL/PLATELET
Absolute Monocytes: 374 cells/uL (ref 200–950)
Basophils Absolute: 8 cells/uL (ref 0–200)
Basophils Relative: 0.2 %
EOS ABS: 130 {cells}/uL (ref 15–500)
Eosinophils Relative: 3.1 %
HCT: 36.8 % (ref 35.0–45.0)
Hemoglobin: 12.1 g/dL (ref 11.7–15.5)
Lymphs Abs: 1235 cells/uL (ref 850–3900)
MCH: 27.1 pg (ref 27.0–33.0)
MCHC: 32.9 g/dL (ref 32.0–36.0)
MCV: 82.5 fL (ref 80.0–100.0)
MPV: 13.5 fL — ABNORMAL HIGH (ref 7.5–12.5)
Monocytes Relative: 8.9 %
NEUTROS ABS: 2453 {cells}/uL (ref 1500–7800)
Neutrophils Relative %: 58.4 %
Platelets: 132 10*3/uL — ABNORMAL LOW (ref 140–400)
RBC: 4.46 10*6/uL (ref 3.80–5.10)
RDW: 14.8 % (ref 11.0–15.0)
Total Lymphocyte: 29.4 %
WBC: 4.2 10*3/uL (ref 3.8–10.8)

## 2018-09-24 LAB — BASIC METABOLIC PANEL
BUN: 17 mg/dL (ref 7–25)
CO2: 29 mmol/L (ref 20–32)
CREATININE: 0.72 mg/dL (ref 0.60–0.93)
Calcium: 8.6 mg/dL (ref 8.6–10.4)
Chloride: 103 mmol/L (ref 98–110)
Glucose, Bld: 149 mg/dL — ABNORMAL HIGH (ref 65–99)
Potassium: 4 mmol/L (ref 3.5–5.3)
Sodium: 142 mmol/L (ref 135–146)

## 2018-09-24 LAB — HEMOGLOBIN A1C
EAG (MMOL/L): 9 (calc)
Hgb A1c MFr Bld: 7.3 % of total Hgb — ABNORMAL HIGH (ref <5.7)
MEAN PLASMA GLUCOSE: 163 (calc)

## 2018-09-25 ENCOUNTER — Encounter: Payer: Self-pay | Admitting: *Deleted

## 2018-09-25 DIAGNOSIS — F0150 Vascular dementia without behavioral disturbance: Secondary | ICD-10-CM | POA: Diagnosis not present

## 2018-09-25 DIAGNOSIS — G309 Alzheimer's disease, unspecified: Secondary | ICD-10-CM | POA: Diagnosis not present

## 2018-09-25 DIAGNOSIS — I2510 Atherosclerotic heart disease of native coronary artery without angina pectoris: Secondary | ICD-10-CM | POA: Diagnosis not present

## 2018-09-25 DIAGNOSIS — F0280 Dementia in other diseases classified elsewhere without behavioral disturbance: Secondary | ICD-10-CM | POA: Diagnosis not present

## 2018-09-25 DIAGNOSIS — M80051D Age-related osteoporosis with current pathological fracture, right femur, subsequent encounter for fracture with routine healing: Secondary | ICD-10-CM | POA: Diagnosis not present

## 2018-09-25 DIAGNOSIS — I69318 Other symptoms and signs involving cognitive functions following cerebral infarction: Secondary | ICD-10-CM | POA: Diagnosis not present

## 2018-09-28 ENCOUNTER — Ambulatory Visit (INDEPENDENT_AMBULATORY_CARE_PROVIDER_SITE_OTHER): Payer: Medicare Other | Admitting: Adult Health

## 2018-09-28 ENCOUNTER — Encounter: Payer: Self-pay | Admitting: Adult Health

## 2018-09-28 VITALS — BP 139/73 | HR 78 | Ht 64.0 in | Wt 122.2 lb

## 2018-09-28 DIAGNOSIS — E119 Type 2 diabetes mellitus without complications: Secondary | ICD-10-CM | POA: Diagnosis not present

## 2018-09-28 DIAGNOSIS — I639 Cerebral infarction, unspecified: Secondary | ICD-10-CM

## 2018-09-28 DIAGNOSIS — I10 Essential (primary) hypertension: Secondary | ICD-10-CM

## 2018-09-28 DIAGNOSIS — R2990 Unspecified symptoms and signs involving the nervous system: Secondary | ICD-10-CM

## 2018-09-28 DIAGNOSIS — Z794 Long term (current) use of insulin: Secondary | ICD-10-CM

## 2018-09-28 NOTE — Progress Notes (Signed)
I agree with the above plan 

## 2018-09-28 NOTE — Progress Notes (Signed)
[facility] [address] [place]. [place] [address] [phone]       OFFICE FOLLOW UP NOTE  [name] Date of Birth:  [birth year] Medical Record Number:  [id number]   Reason for Referral:  hospital suspected stroke s/p TPA follow up  CHIEF COMPLAINT:  Chief Complaint  Kristin Grant presents with  . Follow-up    6 month follow up. Caregiver present. Rm 9. Kristin Grant's caregiver mentioned that Kristin Grant has a knot on Kristin Grant right foot.     HPI:  09/28/18  Kristin Grant is being seen today for stroke follow-up and is accompanied by a caregiver at [facility].  Kristin Grant has been stable from a neurological standpoint but unfortunately suffered a fall on 08/02/2018 resulting in a closed intertrochanteric fracture of right hip.  Kristin Grant continues to reside at [facility]  Kristin Grant does continue to participate in PT for recent hip fracture. Kristin Grant is currently in w/c and caregiver is unsure if Kristin Grant is ambulating with RW at this time. Kristin Grant continues on aspirin and Plavix with mild bruising but no bleeding.  Blood pressure today 139/73.  Caregiver is unsure if cognition/memory has worsened since prior visit.  Caregiver also has concern of right ankle bulge. Apparently this was just noticed today prior to leaving appt. Kristin Grant denies pain.  Deferred further work up to PCP. Denies new or worsening stroke/TIA symptoms.   History summary:  Kristin Grant is being seen today for initial visit in Kristin office for suspected stroke s/p TPA on 11/14/17. History obtained from Kristin Grant and chart review. Reviewed all radiology images and labs personally.  Kristin Grant is a 78 y.o. female with history of Alzheimer's dementia, hypothyroidism, coronary artery disease, diabetes mellitus, hypertension, right shoulder fracture in January and previous stroke presenting with increased confusion and aphasia at [facility]. CT head showed no acute abnormality. Through tele-neurology, it was determined Kristin Grant should receive IV tPA   and then transferred to [facility]. MRI brain reviewed and showed no acute infarct but did show brain atrophy including midbrain atrophy. CTA and neck showed bilateral V4 stenosis. EEG showed mild background slowing but negative for seizure. 2D echo showed EF of 55-60%. LDL satisfactory at 47. A1c 7.0 and recommended close follow up with PCP. Previously on aspirin and plavix and recommended to continue aspirin and plavix. Kristin Grant returned back to assisted living at [facility].   Since discharge, Kristin Grant's been doing well.  Continues to reside at [facility]. Continues aspirin and Plavix without side effects of myalgias.  Blood pressures today's visit 122/62.  Kristin Grant states that Kristin Grant speech is good and is at Kristin Grant baseline.  Kristin Grant does have a history of Alzheimer's dementia and CNA who is present with Kristin Grant does not believe Kristin Grant memory has worsened.  Kristin Grant is disoriented to time and place.  Recall 0/3.  AFT 7 and clock drawing 0/4.  MMSE performed on Kristin Grant today in order to get a baseline and monitor memory status.  Denies new or worsening stroke/TIA symptoms.  03/26/18 UPDATE: Kristin Grant is being seen today for follow-up and is accompanied by aide from [facility].  Overall Kristin Grant states Kristin Grant has been doing well and Kristin Grant memory has been stable.  Per Kristin aide, Kristin Grant believes Kristin Grant memory has been improving.  Kristin Grant is able to state Kristin date with Kristin day, month and year along with current city that we are in (previous appointment, unable to state month, year or place).  Kristin Grant continues to take aspirin and Plavix with  mild bruising but no bleeding.  Blood pressure satisfactory 132/69.  Continues to use rolling walker due to balance.  Denies new or worsening stroke/TIA symptoms.  ROS:   14 system review of systems performed and negative with exception of memory loss  PMH:  Past Medical History:  Diagnosis Date  . Alzheimer disease (HCC)   . Arthritis   . Coronary artery disease   . Diabetes mellitus   . Difficulty walking   .  Hypertension   . Reflux   . Stroke [facility])     PSH:  Past Surgical History:  Procedure Laterality Date  . APPENDECTOMY    . FINGER DEBRIDEMENT    . INTRAMEDULLARY (IM) NAIL INTERTROCHANTERIC Right 08/03/2018   Procedure: INTRAMEDULLARY (IM) NAIL INTERTROCHANTRIC;  Surgeon: Eldred Manges, MD;  Location: [facility] OR;  Service: Orthopedics;  Laterality: Right;    Social History:  Social History   Socioeconomic History  . Marital status: Widowed    Spouse name: Not on file  . Number of children: Not on file  . Years of education: Not on file  . Highest education level: Not on file  Occupational History  . Not on file  Social Needs  . Financial resource strain: Not on file  . Food insecurity:    Worry: Not on file    Inability: Not on file  . Transportation needs:    Medical: Not on file    Non-medical: Not on file  Tobacco Use  . Smoking status: Never Smoker  . Smokeless tobacco: Never Used  Substance and Sexual Activity  . Alcohol use: No  . Drug use: No  . Sexual activity: Not on file  Lifestyle  . Physical activity:    Days per week: Not on file    Minutes per session: Not on file  . Stress: Not on file  Relationships  . Social connections:    Talks on phone: Not on file    Gets together: Not on file    Attends religious service: Not on file    Active member of club or organization: Not on file    Attends meetings of clubs or organizations: Not on file    Relationship status: Not on file  . Intimate partner violence:    Fear of current or ex partner: Not on file    Emotionally abused: Not on file    Physically abused: Not on file    Forced sexual activity: Not on file  Other Topics Concern  . Not on file  Social History Narrative  . Not on file    Family History:  Family History  Problem Relation Age of Onset  . Hypertension Mother   . Heart disease Mother   . Hypertension Father   . Heart disease Father   . Hypertension Sister   . Heart disease Sister     . Cancer Sister        BREAST   . Hypertension Brother   . Heart disease Brother     Medications:   Current Outpatient Medications on File Prior to Visit  Medication Sig Dispense Refill  . acetaminophen (TYLENOL) 325 MG tablet Take 2 tablets (650 mg total) by mouth every 6 (six) hours as needed for mild pain, moderate pain or fever. 30 tablet 0  . albuterol (PROVENTIL HFA;VENTOLIN HFA) 108 (90 Base) MCG/ACT inhaler Inhale 2 puffs into Kristin lungs every 6 (six) hours as needed for wheezing or shortness of breath. 1 Inhaler 0  . aspirin 81  MG chewable tablet Chew 81 mg by mouth daily. Starting 09/04/2018    . azithromycin (ZITHROMAX) 250 MG tablet Take 2 tablets x 1 day, then 1 tablet daily x 4 days 6 tablet 0  . clopidogrel (PLAVIX) 75 MG tablet TAKE 1 TABLET (75mg ) BY MOUTH ONCE DAILY. 30 tablet 11  . docusate sodium (COLACE) 100 MG capsule Take 1 capsule (100 mg total) by mouth 2 (two) times daily. 10 capsule 0  . Ferrous Sulfate (IRON) 325 (65 Fe) MG TABS Take 1 tablet by mouth once a day    . guaiFENesin-dextromethorphan (ROBITUSSIN DM) 100-10 MG/5ML syrup Take 5 mLs by mouth every 6 (six) hours as needed for cough. 118 mL 0  . insulin aspart (NOVOLOG FLEXPEN) 100 UNIT/ML FlexPen Inject 6 Units into Kristin skin 2 (two) times daily.    . insulin glargine (LANTUS) 100 UNIT/ML injection Inject 14 Units into Kristin skin at bedtime.    Marland Kitchen. levothyroxine (SYNTHROID, LEVOTHROID) 50 MCG tablet TAKE 1 TABLET BY MOUTH ONCE DAILY. 30 tablet 11  . lisinopril (PRINIVIL,ZESTRIL) 40 MG tablet TAKE 1 TABLET BY MOUTH ONCE DAILY. 30 tablet 3  . mirtazapine (REMERON) 15 MG tablet TAKE 1 TABLET BY MOUTH AT BEDTIME. 30 tablet 11  . Multiple Vitamin (DAILY VITE) TABS TAKE 1 TABLET BY MOUTH ONCE DAILY. 30 each 0  . NOVOFINE AUTOCOVER 30G X 8 MM MISC USE AS DIRECTED FOR INSULIN ADMINISTRATION 3 TIMES DAILY. 100 each 0  . pantoprazole (PROTONIX) 40 MG tablet TAKE 1 TABLET BY MOUTH ONCE DAILY. 30 tablet 11  . propranolol  (INDERAL) 40 MG tablet TAKE 1 TABLET BY MOUTH TWICE DAILY. 60 tablet 3  . TRADJENTA 5 MG TABS tablet TAKE 1 TABLET BY MOUTH ONCE DAILY. 30 tablet 11   No current facility-administered medications on file prior to visit.     Allergies:  No Known Allergies   Physical Exam  Vitals:   09/28/18 1256  BP: 139/73  Pulse: 78  Weight: 122 lb 3.2 oz (55.4 kg)  Height: 5\' 4"  (1.626 m)   Body mass index is 20.98 kg/m. No exam data present  General: Frail elderly pleasant Caucasian female, seated, in no evident distress Head: head normocephalic and atraumatic.   Neck: supple with no carotid or supraclavicular bruits Cardiovascular: regular rate and rhythm, no murmurs Musculoskeletal: no deformity Skin:  no rash/petichiae Vascular:  Normal pulses all extremities  Neurologic Exam Mental Status: Awake and fully alert.  Mild dysarthria at baseline. Disoriented to place and time. Attention span, concentration and fund of knowledge not appropriate. Mood and affect appropriate and cooperative with exam.  Cranial Nerves: Pupils equal, briskly reactive to light. Extraocular movements full without nystagmus. Visual fields full to confrontation. Hearing intact. Facial sensation intact. Face, tongue, palate moves normally and symmetrically. Disconjugate gaze with left eye lazy eye, chronic, no vertical eye movement difficulty Motor: Normal bulk and tone.  Bilateral hip flexor weakness. Sensory.: intact to touch , pinprick , position and vibratory sensation.  Coordination: Rapid alternating movements normal in all extremities. Finger-to-nose and heel-to-shin performed accurately bilaterally. Gait and Station: Currently sitting in w/c. Unable to determine if Kristin Grant ambulating since hip fracture. Gait assessment deferred.  Reflexes: 1+ and symmetric. Toes downgoing.    Diagnostic Data (Labs, Imaging, Testing)  CT HEAD WO CONTRAST 11/14/17 IMPRESSION: 1. Severe chronic ischemic microangiopathy  without acute intracranial abnormality. 2. ASPECTS is 10.  CT CEREBRAL PERFUSION W CONTRAST 11/14/17 IMPRESSION: No acute infarct or ischemic penumbra demonstrated by CT perfusion  criteria.  CT ANGIO HEAD W OR WO CONTRAST CT ANGIO NECK W OR WO CONTRAST 11/15/17 IMPRESSION: 1. Negative CTA for large vessel occlusion. 2. Atherosclerotic plaque involving Kristin bilateral V4 segments with resultant moderate stenosis on Kristin left and severe stenosis on Kristin right. Left vertebral artery is dominant. Otherwise widely patent vertebrobasilar system. 3. Additional scattered mild atherosclerotic change for age. No other high-grade or correctable stenosis identified.  MR BRAIN WO CONTRAST 11/16/17 IMPRESSION: 1. No acute infarct or hemorrhage. 2. Severe chronic small vessel ischemia. 3. 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  EEG 11/15/17 Impression: This is an abnormal EEG due to mild background slowing seen throughout Kristin tracing.  This is a very non-specific finding that can be seen with toxic, metabolic, diffuse, or multifocal structural processes, and would not be inconsistent with Kristin patients known history of dementia.  No definite epileptiform changes were noted.   A single EEG without epileptiform changes does not exclude Kristin diagnosis of epilepsy. Clinical correlation advised.  2D ECHOCARDIOGRAM 11/17/17 Study Conclusions - Left ventricle: Kristin cavity size was normal. Wall thickness was   normal. Systolic function was normal. Kristin estimated ejection   fraction was in Kristin range of 55% to 60%. Wall motion was normal;   there were no regional wall motion abnormalities. Doppler   parameters are consistent with abnormal left ventricular   relaxation (grade 1 diastolic dysfunction). - Ventricular septum: Septal motion showed paradox. These changes   are consistent with a left bundle branch block. -  Mitral valve: Moderately calcified annulus. - Pericardium, extracardiac: A trivial pericardial effusion was   identified posterior to Kristin heart.    ASSESSMENT: Henrietta Dinelsene Mallis is a 78 y.o. year old female here with suspected stroke s/p TPA on 11/14/17. Vascular risk factors include CAD, dementia, DM, and HTN.  Returns today for follow-up appointment overall is doing well.  Kristin Grant returns today for follow-up visit and overall continues to do well from a stroke standpoint with residual deficits of dysarthria and cognitive deficits. Unfortunately suffered right hip fracture 2/2 fall.    PLAN: -Continue aspirin 81 mg daily and clopidogrel 75 mg daily for secondary stroke prevention -F/u with PCP regarding your HTN, DM and CAD management -continue to monitor BP at home -Advised to continue to stay active and maintain a healthy diet -ankle nodule deferred to PCP -Maintain strict control of hypertension with blood pressure goal below 130/90, diabetes with hemoglobin A1c goal below 6.5% and cholesterol with LDL cholesterol (bad cholesterol) goal below 70 mg/dL. I also advised Kristin Grant to eat a healthy diet with plenty of whole grains, cereals, fruits and vegetables, exercise regularly and maintain ideal body weight.  Stable from stroke standpoint therefore recommended follow-up as needed   Greater than 50% time during this 25 minute consultation visit was spent on counseling and coordination of care about HTN, DM and CAD, discussion about risk benefit of anticoagulation and answering questions.     George HughJessica Tali Cleaves, AGNP-BC  [facility] [address] [place], [place] [id number]  Phone [phone] Fax [phone]

## 2018-09-28 NOTE — Patient Instructions (Addendum)
Continue aspirin 81 mg daily and clopidogrel 75 mg daily for secondary stroke prevention  Continue to follow up with PCP regarding cholesterol, blood pressure and diabetes management   Possible cyst right ankle - deferred to PCP for further evaluation  Continue to monitor blood pressure at home  Maintain strict control of hypertension with blood pressure goal below 130/90, diabetes with hemoglobin A1c goal below 6.5% and cholesterol with LDL cholesterol (bad cholesterol) goal below 70 mg/dL. I also advised the patient to eat a healthy diet with plenty of whole grains, cereals, fruits and vegetables, exercise regularly and maintain ideal body weight.  Followup in the future with me as needed or call with questions, concerns or need of future follow-up appointment       Thank you for coming to see [place] at [facility]. I hope we have been able to provide you high quality care today.  You may receive a patient satisfaction survey over the next few weeks. We would appreciate your feedback and comments so that we may continue to improve ourselves and the health of our patients.

## 2018-09-29 DIAGNOSIS — I69318 Other symptoms and signs involving cognitive functions following cerebral infarction: Secondary | ICD-10-CM | POA: Diagnosis not present

## 2018-09-29 DIAGNOSIS — F0150 Vascular dementia without behavioral disturbance: Secondary | ICD-10-CM | POA: Diagnosis not present

## 2018-09-29 DIAGNOSIS — M80051D Age-related osteoporosis with current pathological fracture, right femur, subsequent encounter for fracture with routine healing: Secondary | ICD-10-CM | POA: Diagnosis not present

## 2018-09-29 DIAGNOSIS — F0280 Dementia in other diseases classified elsewhere without behavioral disturbance: Secondary | ICD-10-CM | POA: Diagnosis not present

## 2018-09-29 DIAGNOSIS — I2510 Atherosclerotic heart disease of native coronary artery without angina pectoris: Secondary | ICD-10-CM | POA: Diagnosis not present

## 2018-09-29 DIAGNOSIS — G309 Alzheimer's disease, unspecified: Secondary | ICD-10-CM | POA: Diagnosis not present

## 2018-10-01 DIAGNOSIS — F0150 Vascular dementia without behavioral disturbance: Secondary | ICD-10-CM | POA: Diagnosis not present

## 2018-10-01 DIAGNOSIS — I69318 Other symptoms and signs involving cognitive functions following cerebral infarction: Secondary | ICD-10-CM | POA: Diagnosis not present

## 2018-10-01 DIAGNOSIS — G309 Alzheimer's disease, unspecified: Secondary | ICD-10-CM | POA: Diagnosis not present

## 2018-10-01 DIAGNOSIS — I2510 Atherosclerotic heart disease of native coronary artery without angina pectoris: Secondary | ICD-10-CM | POA: Diagnosis not present

## 2018-10-01 DIAGNOSIS — M80051D Age-related osteoporosis with current pathological fracture, right femur, subsequent encounter for fracture with routine healing: Secondary | ICD-10-CM | POA: Diagnosis not present

## 2018-10-01 DIAGNOSIS — F0280 Dementia in other diseases classified elsewhere without behavioral disturbance: Secondary | ICD-10-CM | POA: Diagnosis not present

## 2018-10-05 DIAGNOSIS — I2510 Atherosclerotic heart disease of native coronary artery without angina pectoris: Secondary | ICD-10-CM | POA: Diagnosis not present

## 2018-10-05 DIAGNOSIS — I69318 Other symptoms and signs involving cognitive functions following cerebral infarction: Secondary | ICD-10-CM | POA: Diagnosis not present

## 2018-10-05 DIAGNOSIS — F0280 Dementia in other diseases classified elsewhere without behavioral disturbance: Secondary | ICD-10-CM | POA: Diagnosis not present

## 2018-10-05 DIAGNOSIS — F0150 Vascular dementia without behavioral disturbance: Secondary | ICD-10-CM | POA: Diagnosis not present

## 2018-10-05 DIAGNOSIS — M80051D Age-related osteoporosis with current pathological fracture, right femur, subsequent encounter for fracture with routine healing: Secondary | ICD-10-CM | POA: Diagnosis not present

## 2018-10-05 DIAGNOSIS — G309 Alzheimer's disease, unspecified: Secondary | ICD-10-CM | POA: Diagnosis not present

## 2018-10-06 ENCOUNTER — Ambulatory Visit (INDEPENDENT_AMBULATORY_CARE_PROVIDER_SITE_OTHER): Payer: Medicare Other | Admitting: Family Medicine

## 2018-10-06 ENCOUNTER — Other Ambulatory Visit: Payer: Self-pay

## 2018-10-06 ENCOUNTER — Encounter: Payer: Self-pay | Admitting: Family Medicine

## 2018-10-06 VITALS — BP 132/66 | HR 80 | Temp 98.9°F | Resp 14 | Ht 62.0 in | Wt 120.4 lb

## 2018-10-06 DIAGNOSIS — I69318 Other symptoms and signs involving cognitive functions following cerebral infarction: Secondary | ICD-10-CM | POA: Diagnosis not present

## 2018-10-06 DIAGNOSIS — I639 Cerebral infarction, unspecified: Secondary | ICD-10-CM | POA: Diagnosis not present

## 2018-10-06 DIAGNOSIS — G309 Alzheimer's disease, unspecified: Secondary | ICD-10-CM | POA: Diagnosis not present

## 2018-10-06 DIAGNOSIS — M80051D Age-related osteoporosis with current pathological fracture, right femur, subsequent encounter for fracture with routine healing: Secondary | ICD-10-CM | POA: Diagnosis not present

## 2018-10-06 DIAGNOSIS — F0150 Vascular dementia without behavioral disturbance: Secondary | ICD-10-CM | POA: Diagnosis not present

## 2018-10-06 DIAGNOSIS — E1100 Type 2 diabetes mellitus with hyperosmolarity without nonketotic hyperglycemic-hyperosmolar coma (NKHHC): Secondary | ICD-10-CM

## 2018-10-06 DIAGNOSIS — M85672 Other cyst of bone, left ankle and foot: Secondary | ICD-10-CM | POA: Diagnosis not present

## 2018-10-06 DIAGNOSIS — E440 Moderate protein-calorie malnutrition: Secondary | ICD-10-CM

## 2018-10-06 DIAGNOSIS — I2510 Atherosclerotic heart disease of native coronary artery without angina pectoris: Secondary | ICD-10-CM | POA: Diagnosis not present

## 2018-10-06 DIAGNOSIS — F0280 Dementia in other diseases classified elsewhere without behavioral disturbance: Secondary | ICD-10-CM | POA: Diagnosis not present

## 2018-10-06 MED ORDER — MIRTAZAPINE 30 MG PO TABS: 30.0000 mg | ORAL_TABLET | Freq: Every day | ORAL | 2 refills | Status: DC

## 2018-10-06 NOTE — Patient Instructions (Signed)
F/U as previous 

## 2018-10-06 NOTE — Progress Notes (Signed)
   Subjective:    Patient ID: Kristin Grant, female    DOB: Jan 19, 1941, 78 y.o.   MRN: [id number]  Patient presents for Follow-up (is not fasting) Pt here to f/u Diabetes and weight- intermin visit seen 3 weeks ago  Her weight is down 2 more lbs since last visit  Dm- LAST a1c 7.3% fastings 84-136 , noon 135-216  Dinner 89-233  Bedtime 100-199  Cough has resolved   No new concerns   Review Of Systems:  GEN- denies fatigue, fever, weight loss,weakness, recent illness HEENT- denies eye drainage, change in vision, nasal discharge, CVS- denies chest pain, palpitations RESP- denies SOB, cough, wheeze ABD- denies N/V, change in stools, abd pain GU- denies dysuria, hematuria, dribbling, incontinence MSK- denies joint pain, muscle aches, injury Neuro- denies headache, dizziness, syncope, seizure activity       Objective:    BP 132/66   Pulse 80   Temp 98.9 F (37.2 C) (Oral)   Resp 14   Ht 5\' 2"  (1.575 m)   Wt 120 lb 6.4 oz (54.6 kg)   SpO2 96%   BMI 22.02 kg/m  GEN- NAD, alert and oriented x2,sitting in wheelchair  HEENT- PERRL, EOMI, non injected sclera, pink conjunctiva, MMM, oropharynx clear\ CVS- RRR, no murmur RESP-CTAB EXT- No edema,has small cystic knot on inner right foot in ant to medial malleous, NT Pulses- Radial, DP- 2+        Assessment & Plan:      Problem List Items Addressed This Visit      Unprioritized   Diabetes mellitus, type II (HCC) - Primary (Chronic)    CBG overall much improved No change to insulin , lantus 14 units, Novolog with meals       Malnutrition of moderate degree    Will not drink protein supplements Increase remeron to 30 units Weight weekly, staff to report every month       Other Visit Diagnoses    Other cyst of bone, left ankle and foot       Likely from her fall, no pain or sign of infection, will monitor for now       Note: This dictation was prepared with Dragon dictation along with smaller phrase technology. Any  transcriptional errors that result from this process are unintentional.

## 2018-10-06 NOTE — Assessment & Plan Note (Addendum)
CBG overall much improved No change to insulin , lantus 14 units, Novolog with meals

## 2018-10-06 NOTE — Assessment & Plan Note (Signed)
Will not drink protein supplements Increase remeron to 30 units Weight weekly, staff to report every month

## 2018-10-08 ENCOUNTER — Telehealth: Payer: Self-pay | Admitting: Family Medicine

## 2018-10-08 ENCOUNTER — Other Ambulatory Visit: Payer: Self-pay | Admitting: Family Medicine

## 2018-10-08 DIAGNOSIS — M80051D Age-related osteoporosis with current pathological fracture, right femur, subsequent encounter for fracture with routine healing: Secondary | ICD-10-CM | POA: Diagnosis not present

## 2018-10-08 DIAGNOSIS — F0150 Vascular dementia without behavioral disturbance: Secondary | ICD-10-CM | POA: Diagnosis not present

## 2018-10-08 DIAGNOSIS — F0280 Dementia in other diseases classified elsewhere without behavioral disturbance: Secondary | ICD-10-CM | POA: Diagnosis not present

## 2018-10-08 DIAGNOSIS — I69318 Other symptoms and signs involving cognitive functions following cerebral infarction: Secondary | ICD-10-CM | POA: Diagnosis not present

## 2018-10-08 DIAGNOSIS — G309 Alzheimer's disease, unspecified: Secondary | ICD-10-CM | POA: Diagnosis not present

## 2018-10-08 DIAGNOSIS — I2510 Atherosclerotic heart disease of native coronary artery without angina pectoris: Secondary | ICD-10-CM | POA: Diagnosis not present

## 2018-10-08 NOTE — Telephone Encounter (Signed)
[phone] Lupita Leash from [employer]  Calling to say that patients urine is very strong smelling  Would like to know if something can be called in for her

## 2018-10-09 DIAGNOSIS — G309 Alzheimer's disease, unspecified: Secondary | ICD-10-CM | POA: Diagnosis not present

## 2018-10-09 DIAGNOSIS — I69391 Dysphagia following cerebral infarction: Secondary | ICD-10-CM | POA: Diagnosis not present

## 2018-10-09 DIAGNOSIS — I10 Essential (primary) hypertension: Secondary | ICD-10-CM | POA: Diagnosis not present

## 2018-10-09 DIAGNOSIS — M80051D Age-related osteoporosis with current pathological fracture, right femur, subsequent encounter for fracture with routine healing: Secondary | ICD-10-CM | POA: Diagnosis not present

## 2018-10-09 DIAGNOSIS — E1149 Type 2 diabetes mellitus with other diabetic neurological complication: Secondary | ICD-10-CM | POA: Diagnosis not present

## 2018-10-09 DIAGNOSIS — W19XXXD Unspecified fall, subsequent encounter: Secondary | ICD-10-CM | POA: Diagnosis not present

## 2018-10-09 DIAGNOSIS — Z794 Long term (current) use of insulin: Secondary | ICD-10-CM | POA: Diagnosis not present

## 2018-10-09 DIAGNOSIS — I2510 Atherosclerotic heart disease of native coronary artery without angina pectoris: Secondary | ICD-10-CM | POA: Diagnosis not present

## 2018-10-09 DIAGNOSIS — I69318 Other symptoms and signs involving cognitive functions following cerebral infarction: Secondary | ICD-10-CM | POA: Diagnosis not present

## 2018-10-09 DIAGNOSIS — R1312 Dysphagia, oropharyngeal phase: Secondary | ICD-10-CM | POA: Diagnosis not present

## 2018-10-09 DIAGNOSIS — F0150 Vascular dementia without behavioral disturbance: Secondary | ICD-10-CM | POA: Diagnosis not present

## 2018-10-09 DIAGNOSIS — F0280 Dementia in other diseases classified elsewhere without behavioral disturbance: Secondary | ICD-10-CM | POA: Diagnosis not present

## 2018-10-09 MED ORDER — CEPHALEXIN 250 MG PO CAPS: 250.0000 mg | ORAL_CAPSULE | Freq: Two times a day (BID) | ORAL | 0 refills | Status: AC

## 2018-10-09 NOTE — Telephone Encounter (Signed)
Keflex 250mg QID x  5 days 

## 2018-10-09 NOTE — Telephone Encounter (Signed)
Kristin Grant aware and med sent to [facility]

## 2018-10-13 DIAGNOSIS — G309 Alzheimer's disease, unspecified: Secondary | ICD-10-CM | POA: Diagnosis not present

## 2018-10-13 DIAGNOSIS — M80051D Age-related osteoporosis with current pathological fracture, right femur, subsequent encounter for fracture with routine healing: Secondary | ICD-10-CM | POA: Diagnosis not present

## 2018-10-13 DIAGNOSIS — I69318 Other symptoms and signs involving cognitive functions following cerebral infarction: Secondary | ICD-10-CM | POA: Diagnosis not present

## 2018-10-13 DIAGNOSIS — F0280 Dementia in other diseases classified elsewhere without behavioral disturbance: Secondary | ICD-10-CM | POA: Diagnosis not present

## 2018-10-13 DIAGNOSIS — I2510 Atherosclerotic heart disease of native coronary artery without angina pectoris: Secondary | ICD-10-CM | POA: Diagnosis not present

## 2018-10-13 DIAGNOSIS — F0150 Vascular dementia without behavioral disturbance: Secondary | ICD-10-CM | POA: Diagnosis not present

## 2018-10-14 DIAGNOSIS — F0150 Vascular dementia without behavioral disturbance: Secondary | ICD-10-CM | POA: Diagnosis not present

## 2018-10-14 DIAGNOSIS — I2510 Atherosclerotic heart disease of native coronary artery without angina pectoris: Secondary | ICD-10-CM | POA: Diagnosis not present

## 2018-10-14 DIAGNOSIS — M80051D Age-related osteoporosis with current pathological fracture, right femur, subsequent encounter for fracture with routine healing: Secondary | ICD-10-CM | POA: Diagnosis not present

## 2018-10-14 DIAGNOSIS — I69318 Other symptoms and signs involving cognitive functions following cerebral infarction: Secondary | ICD-10-CM | POA: Diagnosis not present

## 2018-10-14 DIAGNOSIS — G309 Alzheimer's disease, unspecified: Secondary | ICD-10-CM | POA: Diagnosis not present

## 2018-10-14 DIAGNOSIS — F0280 Dementia in other diseases classified elsewhere without behavioral disturbance: Secondary | ICD-10-CM | POA: Diagnosis not present

## 2018-10-15 DIAGNOSIS — I69318 Other symptoms and signs involving cognitive functions following cerebral infarction: Secondary | ICD-10-CM | POA: Diagnosis not present

## 2018-10-15 DIAGNOSIS — G309 Alzheimer's disease, unspecified: Secondary | ICD-10-CM | POA: Diagnosis not present

## 2018-10-15 DIAGNOSIS — M80051D Age-related osteoporosis with current pathological fracture, right femur, subsequent encounter for fracture with routine healing: Secondary | ICD-10-CM | POA: Diagnosis not present

## 2018-10-15 DIAGNOSIS — F0280 Dementia in other diseases classified elsewhere without behavioral disturbance: Secondary | ICD-10-CM | POA: Diagnosis not present

## 2018-10-15 DIAGNOSIS — F0150 Vascular dementia without behavioral disturbance: Secondary | ICD-10-CM | POA: Diagnosis not present

## 2018-10-15 DIAGNOSIS — I2510 Atherosclerotic heart disease of native coronary artery without angina pectoris: Secondary | ICD-10-CM | POA: Diagnosis not present

## 2018-10-21 DIAGNOSIS — I69318 Other symptoms and signs involving cognitive functions following cerebral infarction: Secondary | ICD-10-CM | POA: Diagnosis not present

## 2018-10-21 DIAGNOSIS — M80051D Age-related osteoporosis with current pathological fracture, right femur, subsequent encounter for fracture with routine healing: Secondary | ICD-10-CM | POA: Diagnosis not present

## 2018-10-21 DIAGNOSIS — I2510 Atherosclerotic heart disease of native coronary artery without angina pectoris: Secondary | ICD-10-CM | POA: Diagnosis not present

## 2018-10-21 DIAGNOSIS — F0150 Vascular dementia without behavioral disturbance: Secondary | ICD-10-CM | POA: Diagnosis not present

## 2018-10-21 DIAGNOSIS — G309 Alzheimer's disease, unspecified: Secondary | ICD-10-CM | POA: Diagnosis not present

## 2018-10-21 DIAGNOSIS — F0280 Dementia in other diseases classified elsewhere without behavioral disturbance: Secondary | ICD-10-CM | POA: Diagnosis not present

## 2018-10-22 ENCOUNTER — Other Ambulatory Visit: Payer: Self-pay | Admitting: Family Medicine

## 2018-10-23 DIAGNOSIS — I2510 Atherosclerotic heart disease of native coronary artery without angina pectoris: Secondary | ICD-10-CM | POA: Diagnosis not present

## 2018-10-23 DIAGNOSIS — F0280 Dementia in other diseases classified elsewhere without behavioral disturbance: Secondary | ICD-10-CM | POA: Diagnosis not present

## 2018-10-23 DIAGNOSIS — I69318 Other symptoms and signs involving cognitive functions following cerebral infarction: Secondary | ICD-10-CM | POA: Diagnosis not present

## 2018-10-23 DIAGNOSIS — G309 Alzheimer's disease, unspecified: Secondary | ICD-10-CM | POA: Diagnosis not present

## 2018-10-23 DIAGNOSIS — F0150 Vascular dementia without behavioral disturbance: Secondary | ICD-10-CM | POA: Diagnosis not present

## 2018-10-23 DIAGNOSIS — M80051D Age-related osteoporosis with current pathological fracture, right femur, subsequent encounter for fracture with routine healing: Secondary | ICD-10-CM | POA: Diagnosis not present

## 2018-10-26 DIAGNOSIS — I2510 Atherosclerotic heart disease of native coronary artery without angina pectoris: Secondary | ICD-10-CM | POA: Diagnosis not present

## 2018-10-26 DIAGNOSIS — F0150 Vascular dementia without behavioral disturbance: Secondary | ICD-10-CM | POA: Diagnosis not present

## 2018-10-26 DIAGNOSIS — G309 Alzheimer's disease, unspecified: Secondary | ICD-10-CM | POA: Diagnosis not present

## 2018-10-26 DIAGNOSIS — M80051D Age-related osteoporosis with current pathological fracture, right femur, subsequent encounter for fracture with routine healing: Secondary | ICD-10-CM | POA: Diagnosis not present

## 2018-10-26 DIAGNOSIS — I69318 Other symptoms and signs involving cognitive functions following cerebral infarction: Secondary | ICD-10-CM | POA: Diagnosis not present

## 2018-10-26 DIAGNOSIS — F0280 Dementia in other diseases classified elsewhere without behavioral disturbance: Secondary | ICD-10-CM | POA: Diagnosis not present

## 2018-10-28 DIAGNOSIS — I69318 Other symptoms and signs involving cognitive functions following cerebral infarction: Secondary | ICD-10-CM | POA: Diagnosis not present

## 2018-10-28 DIAGNOSIS — I2510 Atherosclerotic heart disease of native coronary artery without angina pectoris: Secondary | ICD-10-CM | POA: Diagnosis not present

## 2018-10-28 DIAGNOSIS — F0280 Dementia in other diseases classified elsewhere without behavioral disturbance: Secondary | ICD-10-CM | POA: Diagnosis not present

## 2018-10-28 DIAGNOSIS — M80051D Age-related osteoporosis with current pathological fracture, right femur, subsequent encounter for fracture with routine healing: Secondary | ICD-10-CM | POA: Diagnosis not present

## 2018-10-28 DIAGNOSIS — G309 Alzheimer's disease, unspecified: Secondary | ICD-10-CM | POA: Diagnosis not present

## 2018-10-28 DIAGNOSIS — F0150 Vascular dementia without behavioral disturbance: Secondary | ICD-10-CM | POA: Diagnosis not present

## 2018-11-02 DIAGNOSIS — I2510 Atherosclerotic heart disease of native coronary artery without angina pectoris: Secondary | ICD-10-CM | POA: Diagnosis not present

## 2018-11-02 DIAGNOSIS — G309 Alzheimer's disease, unspecified: Secondary | ICD-10-CM | POA: Diagnosis not present

## 2018-11-02 DIAGNOSIS — M80051D Age-related osteoporosis with current pathological fracture, right femur, subsequent encounter for fracture with routine healing: Secondary | ICD-10-CM | POA: Diagnosis not present

## 2018-11-02 DIAGNOSIS — F0280 Dementia in other diseases classified elsewhere without behavioral disturbance: Secondary | ICD-10-CM | POA: Diagnosis not present

## 2018-11-02 DIAGNOSIS — F0150 Vascular dementia without behavioral disturbance: Secondary | ICD-10-CM | POA: Diagnosis not present

## 2018-11-02 DIAGNOSIS — I69318 Other symptoms and signs involving cognitive functions following cerebral infarction: Secondary | ICD-10-CM | POA: Diagnosis not present

## 2018-11-03 ENCOUNTER — Ambulatory Visit: Payer: Medicare Other | Admitting: Family Medicine

## 2018-11-05 DIAGNOSIS — G309 Alzheimer's disease, unspecified: Secondary | ICD-10-CM | POA: Diagnosis not present

## 2018-11-05 DIAGNOSIS — F0280 Dementia in other diseases classified elsewhere without behavioral disturbance: Secondary | ICD-10-CM | POA: Diagnosis not present

## 2018-11-05 DIAGNOSIS — I69318 Other symptoms and signs involving cognitive functions following cerebral infarction: Secondary | ICD-10-CM | POA: Diagnosis not present

## 2018-11-05 DIAGNOSIS — I2510 Atherosclerotic heart disease of native coronary artery without angina pectoris: Secondary | ICD-10-CM | POA: Diagnosis not present

## 2018-11-05 DIAGNOSIS — F0150 Vascular dementia without behavioral disturbance: Secondary | ICD-10-CM | POA: Diagnosis not present

## 2018-11-05 DIAGNOSIS — M80051D Age-related osteoporosis with current pathological fracture, right femur, subsequent encounter for fracture with routine healing: Secondary | ICD-10-CM | POA: Diagnosis not present

## 2018-11-06 ENCOUNTER — Other Ambulatory Visit: Payer: Self-pay | Admitting: Family Medicine

## 2018-11-06 DIAGNOSIS — F0280 Dementia in other diseases classified elsewhere without behavioral disturbance: Secondary | ICD-10-CM | POA: Diagnosis not present

## 2018-11-06 DIAGNOSIS — I2510 Atherosclerotic heart disease of native coronary artery without angina pectoris: Secondary | ICD-10-CM | POA: Diagnosis not present

## 2018-11-06 DIAGNOSIS — G309 Alzheimer's disease, unspecified: Secondary | ICD-10-CM | POA: Diagnosis not present

## 2018-11-06 DIAGNOSIS — F0150 Vascular dementia without behavioral disturbance: Secondary | ICD-10-CM | POA: Diagnosis not present

## 2018-11-06 DIAGNOSIS — I69318 Other symptoms and signs involving cognitive functions following cerebral infarction: Secondary | ICD-10-CM | POA: Diagnosis not present

## 2018-11-06 DIAGNOSIS — M80051D Age-related osteoporosis with current pathological fracture, right femur, subsequent encounter for fracture with routine healing: Secondary | ICD-10-CM | POA: Diagnosis not present

## 2018-11-08 DIAGNOSIS — I2510 Atherosclerotic heart disease of native coronary artery without angina pectoris: Secondary | ICD-10-CM | POA: Diagnosis not present

## 2018-11-08 DIAGNOSIS — Z794 Long term (current) use of insulin: Secondary | ICD-10-CM | POA: Diagnosis not present

## 2018-11-08 DIAGNOSIS — M80051D Age-related osteoporosis with current pathological fracture, right femur, subsequent encounter for fracture with routine healing: Secondary | ICD-10-CM | POA: Diagnosis not present

## 2018-11-08 DIAGNOSIS — I69391 Dysphagia following cerebral infarction: Secondary | ICD-10-CM | POA: Diagnosis not present

## 2018-11-08 DIAGNOSIS — E1149 Type 2 diabetes mellitus with other diabetic neurological complication: Secondary | ICD-10-CM | POA: Diagnosis not present

## 2018-11-08 DIAGNOSIS — G309 Alzheimer's disease, unspecified: Secondary | ICD-10-CM | POA: Diagnosis not present

## 2018-11-08 DIAGNOSIS — F0280 Dementia in other diseases classified elsewhere without behavioral disturbance: Secondary | ICD-10-CM | POA: Diagnosis not present

## 2018-11-08 DIAGNOSIS — R1312 Dysphagia, oropharyngeal phase: Secondary | ICD-10-CM | POA: Diagnosis not present

## 2018-11-08 DIAGNOSIS — I69318 Other symptoms and signs involving cognitive functions following cerebral infarction: Secondary | ICD-10-CM | POA: Diagnosis not present

## 2018-11-08 DIAGNOSIS — W19XXXD Unspecified fall, subsequent encounter: Secondary | ICD-10-CM | POA: Diagnosis not present

## 2018-11-08 DIAGNOSIS — I10 Essential (primary) hypertension: Secondary | ICD-10-CM | POA: Diagnosis not present

## 2018-11-08 DIAGNOSIS — F0150 Vascular dementia without behavioral disturbance: Secondary | ICD-10-CM | POA: Diagnosis not present

## 2018-11-09 DIAGNOSIS — M80051D Age-related osteoporosis with current pathological fracture, right femur, subsequent encounter for fracture with routine healing: Secondary | ICD-10-CM | POA: Diagnosis not present

## 2018-11-09 DIAGNOSIS — F0150 Vascular dementia without behavioral disturbance: Secondary | ICD-10-CM | POA: Diagnosis not present

## 2018-11-09 DIAGNOSIS — I69318 Other symptoms and signs involving cognitive functions following cerebral infarction: Secondary | ICD-10-CM | POA: Diagnosis not present

## 2018-11-09 DIAGNOSIS — I69391 Dysphagia following cerebral infarction: Secondary | ICD-10-CM | POA: Diagnosis not present

## 2018-11-09 DIAGNOSIS — R1312 Dysphagia, oropharyngeal phase: Secondary | ICD-10-CM | POA: Diagnosis not present

## 2018-11-09 DIAGNOSIS — G309 Alzheimer's disease, unspecified: Secondary | ICD-10-CM | POA: Diagnosis not present

## 2018-11-10 DIAGNOSIS — I69391 Dysphagia following cerebral infarction: Secondary | ICD-10-CM | POA: Diagnosis not present

## 2018-11-10 DIAGNOSIS — G309 Alzheimer's disease, unspecified: Secondary | ICD-10-CM | POA: Diagnosis not present

## 2018-11-10 DIAGNOSIS — M80051D Age-related osteoporosis with current pathological fracture, right femur, subsequent encounter for fracture with routine healing: Secondary | ICD-10-CM | POA: Diagnosis not present

## 2018-11-10 DIAGNOSIS — F0150 Vascular dementia without behavioral disturbance: Secondary | ICD-10-CM | POA: Diagnosis not present

## 2018-11-10 DIAGNOSIS — R1312 Dysphagia, oropharyngeal phase: Secondary | ICD-10-CM | POA: Diagnosis not present

## 2018-11-10 DIAGNOSIS — I69318 Other symptoms and signs involving cognitive functions following cerebral infarction: Secondary | ICD-10-CM | POA: Diagnosis not present

## 2018-11-11 ENCOUNTER — Ambulatory Visit: Payer: Medicare Other | Admitting: Family Medicine

## 2018-11-11 DIAGNOSIS — F0150 Vascular dementia without behavioral disturbance: Secondary | ICD-10-CM | POA: Diagnosis not present

## 2018-11-11 DIAGNOSIS — I69391 Dysphagia following cerebral infarction: Secondary | ICD-10-CM | POA: Diagnosis not present

## 2018-11-11 DIAGNOSIS — M80051D Age-related osteoporosis with current pathological fracture, right femur, subsequent encounter for fracture with routine healing: Secondary | ICD-10-CM | POA: Diagnosis not present

## 2018-11-11 DIAGNOSIS — R1312 Dysphagia, oropharyngeal phase: Secondary | ICD-10-CM | POA: Diagnosis not present

## 2018-11-11 DIAGNOSIS — I69318 Other symptoms and signs involving cognitive functions following cerebral infarction: Secondary | ICD-10-CM | POA: Diagnosis not present

## 2018-11-11 DIAGNOSIS — G309 Alzheimer's disease, unspecified: Secondary | ICD-10-CM | POA: Diagnosis not present

## 2018-11-12 DIAGNOSIS — G309 Alzheimer's disease, unspecified: Secondary | ICD-10-CM | POA: Diagnosis not present

## 2018-11-12 DIAGNOSIS — I69391 Dysphagia following cerebral infarction: Secondary | ICD-10-CM | POA: Diagnosis not present

## 2018-11-12 DIAGNOSIS — R1312 Dysphagia, oropharyngeal phase: Secondary | ICD-10-CM | POA: Diagnosis not present

## 2018-11-12 DIAGNOSIS — F0150 Vascular dementia without behavioral disturbance: Secondary | ICD-10-CM | POA: Diagnosis not present

## 2018-11-12 DIAGNOSIS — I69318 Other symptoms and signs involving cognitive functions following cerebral infarction: Secondary | ICD-10-CM | POA: Diagnosis not present

## 2018-11-12 DIAGNOSIS — M80051D Age-related osteoporosis with current pathological fracture, right femur, subsequent encounter for fracture with routine healing: Secondary | ICD-10-CM | POA: Diagnosis not present

## 2018-11-16 ENCOUNTER — Telehealth: Payer: Self-pay | Admitting: *Deleted

## 2018-11-16 DIAGNOSIS — I69391 Dysphagia following cerebral infarction: Secondary | ICD-10-CM | POA: Diagnosis not present

## 2018-11-16 DIAGNOSIS — G309 Alzheimer's disease, unspecified: Secondary | ICD-10-CM | POA: Diagnosis not present

## 2018-11-16 DIAGNOSIS — R1312 Dysphagia, oropharyngeal phase: Secondary | ICD-10-CM | POA: Diagnosis not present

## 2018-11-16 DIAGNOSIS — M80051D Age-related osteoporosis with current pathological fracture, right femur, subsequent encounter for fracture with routine healing: Secondary | ICD-10-CM | POA: Diagnosis not present

## 2018-11-16 DIAGNOSIS — I69318 Other symptoms and signs involving cognitive functions following cerebral infarction: Secondary | ICD-10-CM | POA: Diagnosis not present

## 2018-11-16 DIAGNOSIS — F0150 Vascular dementia without behavioral disturbance: Secondary | ICD-10-CM | POA: Diagnosis not present

## 2018-11-16 MED ORDER — CEPHALEXIN 250 MG PO CAPS: 250.0000 mg | ORAL_CAPSULE | Freq: Four times a day (QID) | ORAL | 0 refills | Status: AC

## 2018-11-16 NOTE — Telephone Encounter (Signed)
Received call from Lupita Leash with [facility] [phone]~ telephone.   Reports that patient urine is very strong and dark colored. Also states that patient is having increased irritability and agitation. States that she normally gets this way when she has a UTI.   Requested MD to send ABTx.   MD please advise.

## 2018-11-16 NOTE — Telephone Encounter (Signed)
Keflex 250mg  QID x 5 days  If she does not improve, schedule OV

## 2018-11-16 NOTE — Telephone Encounter (Signed)
Prescription sent to pharmacy.   Order sent to facility.

## 2018-11-18 DIAGNOSIS — M80051D Age-related osteoporosis with current pathological fracture, right femur, subsequent encounter for fracture with routine healing: Secondary | ICD-10-CM | POA: Diagnosis not present

## 2018-11-18 DIAGNOSIS — I69391 Dysphagia following cerebral infarction: Secondary | ICD-10-CM | POA: Diagnosis not present

## 2018-11-18 DIAGNOSIS — G309 Alzheimer's disease, unspecified: Secondary | ICD-10-CM | POA: Diagnosis not present

## 2018-11-18 DIAGNOSIS — I69318 Other symptoms and signs involving cognitive functions following cerebral infarction: Secondary | ICD-10-CM | POA: Diagnosis not present

## 2018-11-18 DIAGNOSIS — F0150 Vascular dementia without behavioral disturbance: Secondary | ICD-10-CM | POA: Diagnosis not present

## 2018-11-18 DIAGNOSIS — R1312 Dysphagia, oropharyngeal phase: Secondary | ICD-10-CM | POA: Diagnosis not present

## 2018-11-20 ENCOUNTER — Ambulatory Visit: Payer: Medicare Other | Admitting: Family Medicine

## 2018-11-23 ENCOUNTER — Other Ambulatory Visit: Payer: Self-pay | Admitting: Family Medicine

## 2018-11-23 DIAGNOSIS — F0150 Vascular dementia without behavioral disturbance: Secondary | ICD-10-CM | POA: Diagnosis not present

## 2018-11-23 DIAGNOSIS — R1312 Dysphagia, oropharyngeal phase: Secondary | ICD-10-CM | POA: Diagnosis not present

## 2018-11-23 DIAGNOSIS — G309 Alzheimer's disease, unspecified: Secondary | ICD-10-CM | POA: Diagnosis not present

## 2018-11-23 DIAGNOSIS — I69391 Dysphagia following cerebral infarction: Secondary | ICD-10-CM | POA: Diagnosis not present

## 2018-11-23 DIAGNOSIS — I69318 Other symptoms and signs involving cognitive functions following cerebral infarction: Secondary | ICD-10-CM | POA: Diagnosis not present

## 2018-11-23 DIAGNOSIS — M80051D Age-related osteoporosis with current pathological fracture, right femur, subsequent encounter for fracture with routine healing: Secondary | ICD-10-CM | POA: Diagnosis not present

## 2018-11-24 DIAGNOSIS — G309 Alzheimer's disease, unspecified: Secondary | ICD-10-CM | POA: Diagnosis not present

## 2018-11-24 DIAGNOSIS — F0150 Vascular dementia without behavioral disturbance: Secondary | ICD-10-CM | POA: Diagnosis not present

## 2018-11-24 DIAGNOSIS — R1312 Dysphagia, oropharyngeal phase: Secondary | ICD-10-CM | POA: Diagnosis not present

## 2018-11-24 DIAGNOSIS — I69318 Other symptoms and signs involving cognitive functions following cerebral infarction: Secondary | ICD-10-CM | POA: Diagnosis not present

## 2018-11-24 DIAGNOSIS — I69391 Dysphagia following cerebral infarction: Secondary | ICD-10-CM | POA: Diagnosis not present

## 2018-11-24 DIAGNOSIS — M80051D Age-related osteoporosis with current pathological fracture, right femur, subsequent encounter for fracture with routine healing: Secondary | ICD-10-CM | POA: Diagnosis not present

## 2018-11-27 ENCOUNTER — Other Ambulatory Visit: Payer: Self-pay | Admitting: Family Medicine

## 2018-12-01 DIAGNOSIS — I69391 Dysphagia following cerebral infarction: Secondary | ICD-10-CM | POA: Diagnosis not present

## 2018-12-01 DIAGNOSIS — R1312 Dysphagia, oropharyngeal phase: Secondary | ICD-10-CM | POA: Diagnosis not present

## 2018-12-01 DIAGNOSIS — I69318 Other symptoms and signs involving cognitive functions following cerebral infarction: Secondary | ICD-10-CM | POA: Diagnosis not present

## 2018-12-01 DIAGNOSIS — M80051D Age-related osteoporosis with current pathological fracture, right femur, subsequent encounter for fracture with routine healing: Secondary | ICD-10-CM | POA: Diagnosis not present

## 2018-12-01 DIAGNOSIS — F0150 Vascular dementia without behavioral disturbance: Secondary | ICD-10-CM | POA: Diagnosis not present

## 2018-12-01 DIAGNOSIS — G309 Alzheimer's disease, unspecified: Secondary | ICD-10-CM | POA: Diagnosis not present

## 2018-12-03 DIAGNOSIS — M80051D Age-related osteoporosis with current pathological fracture, right femur, subsequent encounter for fracture with routine healing: Secondary | ICD-10-CM | POA: Diagnosis not present

## 2018-12-03 DIAGNOSIS — G309 Alzheimer's disease, unspecified: Secondary | ICD-10-CM | POA: Diagnosis not present

## 2018-12-03 DIAGNOSIS — R1312 Dysphagia, oropharyngeal phase: Secondary | ICD-10-CM | POA: Diagnosis not present

## 2018-12-03 DIAGNOSIS — I69318 Other symptoms and signs involving cognitive functions following cerebral infarction: Secondary | ICD-10-CM | POA: Diagnosis not present

## 2018-12-03 DIAGNOSIS — F0150 Vascular dementia without behavioral disturbance: Secondary | ICD-10-CM | POA: Diagnosis not present

## 2018-12-03 DIAGNOSIS — I69391 Dysphagia following cerebral infarction: Secondary | ICD-10-CM | POA: Diagnosis not present

## 2018-12-04 ENCOUNTER — Encounter (HOSPITAL_COMMUNITY): Payer: Self-pay | Admitting: Internal Medicine

## 2018-12-04 ENCOUNTER — Inpatient Hospital Stay (HOSPITAL_COMMUNITY): Payer: Medicare Other

## 2018-12-04 ENCOUNTER — Encounter (HOSPITAL_COMMUNITY)
Admission: EM | Disposition: A | Discharge status: Skilled Nursing Facility | Payer: Self-pay | Source: Home / Self Care | Attending: Family Medicine

## 2018-12-04 ENCOUNTER — Emergency Department (HOSPITAL_COMMUNITY): Payer: Medicare Other

## 2018-12-04 ENCOUNTER — Inpatient Hospital Stay (HOSPITAL_COMMUNITY): Payer: Medicare Other | Admitting: Registered Nurse

## 2018-12-04 ENCOUNTER — Inpatient Hospital Stay (HOSPITAL_COMMUNITY)
Admission: EM | Admit: 2018-12-04 | Discharge: 2018-12-08 | DRG: 956 | Disposition: A | Discharge status: Skilled Nursing Facility | Payer: Medicare Other | Attending: Family Medicine | Admitting: Family Medicine

## 2018-12-04 ENCOUNTER — Other Ambulatory Visit: Payer: Self-pay

## 2018-12-04 DIAGNOSIS — F0280 Dementia in other diseases classified elsewhere without behavioral disturbance: Secondary | ICD-10-CM | POA: Diagnosis not present

## 2018-12-04 DIAGNOSIS — Z79890 Hormone replacement therapy (postmenopausal): Secondary | ICD-10-CM

## 2018-12-04 DIAGNOSIS — Z01818 Encounter for other preprocedural examination: Secondary | ICD-10-CM

## 2018-12-04 DIAGNOSIS — G309 Alzheimer's disease, unspecified: Secondary | ICD-10-CM | POA: Diagnosis not present

## 2018-12-04 DIAGNOSIS — Z8673 Personal history of transient ischemic attack (TIA), and cerebral infarction without residual deficits: Secondary | ICD-10-CM

## 2018-12-04 DIAGNOSIS — S72145A Nondisplaced intertrochanteric fracture of left femur, initial encounter for closed fracture: Secondary | ICD-10-CM

## 2018-12-04 DIAGNOSIS — K219 Gastro-esophageal reflux disease without esophagitis: Secondary | ICD-10-CM | POA: Diagnosis present

## 2018-12-04 DIAGNOSIS — F0281 Dementia in other diseases classified elsewhere with behavioral disturbance: Secondary | ICD-10-CM | POA: Diagnosis not present

## 2018-12-04 DIAGNOSIS — Z7982 Long term (current) use of aspirin: Secondary | ICD-10-CM | POA: Diagnosis not present

## 2018-12-04 DIAGNOSIS — E1100 Type 2 diabetes mellitus with hyperosmolarity without nonketotic hyperglycemic-hyperosmolar coma (NKHHC): Secondary | ICD-10-CM | POA: Diagnosis not present

## 2018-12-04 DIAGNOSIS — E785 Hyperlipidemia, unspecified: Secondary | ICD-10-CM | POA: Diagnosis not present

## 2018-12-04 DIAGNOSIS — R1312 Dysphagia, oropharyngeal phase: Secondary | ICD-10-CM | POA: Diagnosis not present

## 2018-12-04 DIAGNOSIS — Z7902 Long term (current) use of antithrombotics/antiplatelets: Secondary | ICD-10-CM | POA: Diagnosis not present

## 2018-12-04 DIAGNOSIS — E039 Hypothyroidism, unspecified: Secondary | ICD-10-CM | POA: Diagnosis not present

## 2018-12-04 DIAGNOSIS — S72142D Displaced intertrochanteric fracture of left femur, subsequent encounter for closed fracture with routine healing: Secondary | ICD-10-CM | POA: Diagnosis not present

## 2018-12-04 DIAGNOSIS — R410 Disorientation, unspecified: Secondary | ICD-10-CM | POA: Diagnosis not present

## 2018-12-04 DIAGNOSIS — R41841 Cognitive communication deficit: Secondary | ICD-10-CM | POA: Diagnosis not present

## 2018-12-04 DIAGNOSIS — I10 Essential (primary) hypertension: Secondary | ICD-10-CM | POA: Diagnosis present

## 2018-12-04 DIAGNOSIS — D62 Acute posthemorrhagic anemia: Secondary | ICD-10-CM | POA: Diagnosis not present

## 2018-12-04 DIAGNOSIS — Z79899 Other long term (current) drug therapy: Secondary | ICD-10-CM | POA: Diagnosis not present

## 2018-12-04 DIAGNOSIS — G92 Toxic encephalopathy: Secondary | ICD-10-CM | POA: Diagnosis not present

## 2018-12-04 DIAGNOSIS — M6281 Muscle weakness (generalized): Secondary | ICD-10-CM | POA: Diagnosis not present

## 2018-12-04 DIAGNOSIS — I639 Cerebral infarction, unspecified: Secondary | ICD-10-CM | POA: Diagnosis not present

## 2018-12-04 DIAGNOSIS — W19XXXA Unspecified fall, initial encounter: Secondary | ICD-10-CM

## 2018-12-04 DIAGNOSIS — W06XXXA Fall from bed, initial encounter: Secondary | ICD-10-CM | POA: Diagnosis present

## 2018-12-04 DIAGNOSIS — I2510 Atherosclerotic heart disease of native coronary artery without angina pectoris: Secondary | ICD-10-CM | POA: Diagnosis not present

## 2018-12-04 DIAGNOSIS — J449 Chronic obstructive pulmonary disease, unspecified: Secondary | ICD-10-CM | POA: Diagnosis present

## 2018-12-04 DIAGNOSIS — D696 Thrombocytopenia, unspecified: Secondary | ICD-10-CM | POA: Diagnosis present

## 2018-12-04 DIAGNOSIS — M2550 Pain in unspecified joint: Secondary | ICD-10-CM | POA: Diagnosis not present

## 2018-12-04 DIAGNOSIS — M810 Age-related osteoporosis without current pathological fracture: Secondary | ICD-10-CM | POA: Diagnosis not present

## 2018-12-04 DIAGNOSIS — S32592A Other specified fracture of left pubis, initial encounter for closed fracture: Secondary | ICD-10-CM | POA: Diagnosis present

## 2018-12-04 DIAGNOSIS — Z794 Long term (current) use of insulin: Secondary | ICD-10-CM | POA: Diagnosis not present

## 2018-12-04 DIAGNOSIS — R4182 Altered mental status, unspecified: Secondary | ICD-10-CM | POA: Diagnosis not present

## 2018-12-04 DIAGNOSIS — E119 Type 2 diabetes mellitus without complications: Secondary | ICD-10-CM

## 2018-12-04 DIAGNOSIS — R2689 Other abnormalities of gait and mobility: Secondary | ICD-10-CM | POA: Diagnosis not present

## 2018-12-04 DIAGNOSIS — R509 Fever, unspecified: Secondary | ICD-10-CM | POA: Diagnosis not present

## 2018-12-04 DIAGNOSIS — E1165 Type 2 diabetes mellitus with hyperglycemia: Secondary | ICD-10-CM | POA: Diagnosis not present

## 2018-12-04 DIAGNOSIS — Z8249 Family history of ischemic heart disease and other diseases of the circulatory system: Secondary | ICD-10-CM | POA: Diagnosis not present

## 2018-12-04 DIAGNOSIS — S72142A Displaced intertrochanteric fracture of left femur, initial encounter for closed fracture: Principal | ICD-10-CM | POA: Diagnosis present

## 2018-12-04 DIAGNOSIS — S299XXA Unspecified injury of thorax, initial encounter: Secondary | ICD-10-CM | POA: Diagnosis not present

## 2018-12-04 DIAGNOSIS — Z7401 Bed confinement status: Secondary | ICD-10-CM | POA: Diagnosis not present

## 2018-12-04 DIAGNOSIS — Z9181 History of falling: Secondary | ICD-10-CM | POA: Diagnosis not present

## 2018-12-04 DIAGNOSIS — F339 Major depressive disorder, recurrent, unspecified: Secondary | ICD-10-CM | POA: Diagnosis not present

## 2018-12-04 DIAGNOSIS — R0602 Shortness of breath: Secondary | ICD-10-CM | POA: Diagnosis not present

## 2018-12-04 DIAGNOSIS — Z419 Encounter for procedure for purposes other than remedying health state, unspecified: Secondary | ICD-10-CM

## 2018-12-04 DIAGNOSIS — R52 Pain, unspecified: Secondary | ICD-10-CM | POA: Diagnosis not present

## 2018-12-04 DIAGNOSIS — F0390 Unspecified dementia without behavioral disturbance: Secondary | ICD-10-CM | POA: Diagnosis present

## 2018-12-04 DIAGNOSIS — M25552 Pain in left hip: Secondary | ICD-10-CM | POA: Diagnosis not present

## 2018-12-04 DIAGNOSIS — G9341 Metabolic encephalopathy: Secondary | ICD-10-CM | POA: Diagnosis not present

## 2018-12-04 HISTORY — PX: INTRAMEDULLARY (IM) NAIL INTERTROCHANTERIC: SHX5875

## 2018-12-04 LAB — CBC WITH DIFFERENTIAL/PLATELET
Abs Immature Granulocytes: 0.16 10*3/uL — ABNORMAL HIGH (ref 0.00–0.07)
Basophils Absolute: 0.1 10*3/uL (ref 0.0–0.1)
Basophils Relative: 0 %
Eosinophils Absolute: 0.4 10*3/uL (ref 0.0–0.5)
Eosinophils Relative: 3 %
HCT: 41.6 % (ref 36.0–46.0)
Hemoglobin: 13 g/dL (ref 12.0–15.0)
Immature Granulocytes: 1 %
Lymphocytes Relative: 16 %
Lymphs Abs: 1.9 10*3/uL (ref 0.7–4.0)
MCH: 27.1 pg (ref 26.0–34.0)
MCHC: 31.3 g/dL (ref 30.0–36.0)
MCV: 86.7 fL (ref 80.0–100.0)
Monocytes Absolute: 0.9 10*3/uL (ref 0.1–1.0)
Monocytes Relative: 8 %
Neutro Abs: 8.5 10*3/uL — ABNORMAL HIGH (ref 1.7–7.7)
Neutrophils Relative %: 72 %
Platelets: 157 10*3/uL (ref 150–400)
RBC: 4.8 MIL/uL (ref 3.87–5.11)
RDW: 14.8 % (ref 11.5–15.5)
WBC: 11.9 10*3/uL — ABNORMAL HIGH (ref 4.0–10.5)
nRBC: 0 % (ref 0.0–0.2)

## 2018-12-04 LAB — COMPREHENSIVE METABOLIC PANEL
ALT: 20 U/L (ref 0–44)
AST: 19 U/L (ref 15–41)
Albumin: 3.6 g/dL (ref 3.5–5.0)
Alkaline Phosphatase: 68 U/L (ref 38–126)
Anion gap: 8 (ref 5–15)
BUN: 17 mg/dL (ref 8–23)
CO2: 27 mmol/L (ref 22–32)
Calcium: 8.2 mg/dL — ABNORMAL LOW (ref 8.9–10.3)
Chloride: 106 mmol/L (ref 98–111)
Creatinine, Ser: 0.73 mg/dL (ref 0.44–1.00)
GFR calc Af Amer: >60 mL/min (ref >60)
GFR calc non Af Amer: >60 mL/min (ref >60)
Glucose, Bld: 144 mg/dL — ABNORMAL HIGH (ref 70–99)
Potassium: 3.6 mmol/L (ref 3.5–5.1)
Sodium: 141 mmol/L (ref 135–145)
Total Bilirubin: 0.4 mg/dL (ref 0.3–1.2)
Total Protein: 6.2 g/dL — ABNORMAL LOW (ref 6.5–8.1)

## 2018-12-04 LAB — APTT: aPTT: 30 seconds (ref 24–36)

## 2018-12-04 LAB — PROTIME-INR
INR: 1.2 (ref 0.8–1.2)
Prothrombin Time: 15 seconds (ref 11.4–15.2)

## 2018-12-04 LAB — GLUCOSE, CAPILLARY
Glucose-Capillary: 147 mg/dL — ABNORMAL HIGH (ref 70–99)
Glucose-Capillary: 130 mg/dL — ABNORMAL HIGH (ref 70–99)
Glucose-Capillary: 110 mg/dL — ABNORMAL HIGH (ref 70–99)
Glucose-Capillary: 140 mg/dL — ABNORMAL HIGH (ref 70–99)
Glucose-Capillary: 170 mg/dL — ABNORMAL HIGH (ref 70–99)
Glucose-Capillary: 173 mg/dL — ABNORMAL HIGH (ref 70–99)

## 2018-12-04 LAB — CK: Total CK: 46 U/L (ref 38–234)

## 2018-12-04 LAB — HEMOGLOBIN A1C
Hgb A1c MFr Bld: 6.3 % — ABNORMAL HIGH (ref 4.8–5.6)
Mean Plasma Glucose: 134.11 mg/dL

## 2018-12-04 LAB — SURGICAL PCR SCREEN
MRSA, PCR: NEGATIVE
Staphylococcus aureus: NEGATIVE

## 2018-12-04 SURGERY — FIXATION, FRACTURE, INTERTROCHANTERIC, WITH INTRAMEDULLARY ROD
Anesthesia: General | Laterality: Left

## 2018-12-04 MED ORDER — POVIDONE-IODINE 10 % EX SWAB: 2.0000 "application " | Freq: Once | CUTANEOUS | Status: DC

## 2018-12-04 MED ORDER — LIDOCAINE 2% (20 MG/ML) 5 ML SYRINGE
INTRAMUSCULAR | Status: DC | PRN
  Administered 2018-12-04: 40 mg via INTRAVENOUS

## 2018-12-04 MED ORDER — ENSURE ENLIVE PO LIQD
237.0000 mL | Freq: Two times a day (BID) | ORAL | Status: DC
  Administered 2018-12-05 – 2018-12-08 (×5): 237 mL via ORAL

## 2018-12-04 MED ORDER — DOCUSATE SODIUM 100 MG PO CAPS
100.0000 mg | ORAL_CAPSULE | Freq: Two times a day (BID) | ORAL | Status: DC
  Administered 2018-12-04 – 2018-12-08 (×7): 100 mg via ORAL
  Filled 2018-12-04 (×7): qty 1

## 2018-12-04 MED ORDER — FENTANYL CITRATE (PF) 100 MCG/2ML IJ SOLN: 25.0000 ug | INTRAMUSCULAR | Status: DC | PRN

## 2018-12-04 MED ORDER — CELECOXIB 200 MG PO CAPS
200.0000 mg | ORAL_CAPSULE | Freq: Two times a day (BID) | ORAL | Status: DC
  Administered 2018-12-04 – 2018-12-08 (×7): 200 mg via ORAL
  Filled 2018-12-04 (×8): qty 1

## 2018-12-04 MED ORDER — PROPRANOLOL HCL 20 MG PO TABS
40.0000 mg | ORAL_TABLET | Freq: Two times a day (BID) | ORAL | Status: DC
  Administered 2018-12-04 – 2018-12-08 (×8): 40 mg via ORAL
  Filled 2018-12-04 (×10): qty 2

## 2018-12-04 MED ORDER — ONDANSETRON HCL 4 MG/2ML IJ SOLN
INTRAMUSCULAR | Status: AC
  Administered 2018-12-04: 02:00:00
  Filled 2018-12-04: qty 2

## 2018-12-04 MED ORDER — LACTATED RINGERS IV SOLN
INTRAVENOUS | Status: DC | PRN
  Administered 2018-12-04: 14:00:00 via INTRAVENOUS

## 2018-12-04 MED ORDER — FERROUS SULFATE 325 (65 FE) MG PO TABS
325.0000 mg | ORAL_TABLET | Freq: Every day | ORAL | Status: DC
  Administered 2018-12-04 – 2018-12-08 (×5): 325 mg via ORAL
  Filled 2018-12-04 (×5): qty 1

## 2018-12-04 MED ORDER — ALBUTEROL SULFATE (2.5 MG/3ML) 0.083% IN NEBU: 3.0000 mL | INHALATION_SOLUTION | Freq: Four times a day (QID) | RESPIRATORY_TRACT | Status: DC | PRN

## 2018-12-04 MED ORDER — PROMETHAZINE HCL 25 MG/ML IJ SOLN
6.2500 mg | Freq: Four times a day (QID) | INTRAMUSCULAR | Status: DC | PRN
  Administered 2018-12-04: 16:00:00 6.25 mg via INTRAVENOUS

## 2018-12-04 MED ORDER — INSULIN ASPART 100 UNIT/ML ~~LOC~~ SOLN
0.0000 [IU] | SUBCUTANEOUS | Status: DC
  Administered 2018-12-04 – 2018-12-05 (×5): 2 [IU] via SUBCUTANEOUS

## 2018-12-04 MED ORDER — HYDROCODONE-ACETAMINOPHEN 5-325 MG PO TABS
1.0000 | ORAL_TABLET | ORAL | Status: DC | PRN
  Administered 2018-12-04 – 2018-12-05 (×3): 1 via ORAL
  Filled 2018-12-04 (×4): qty 1

## 2018-12-04 MED ORDER — EPHEDRINE SULFATE-NACL 50-0.9 MG/10ML-% IV SOSY
PREFILLED_SYRINGE | INTRAVENOUS | Status: DC | PRN
  Administered 2018-12-04: 10 mg via INTRAVENOUS

## 2018-12-04 MED ORDER — METOCLOPRAMIDE HCL 5 MG PO TABS: 5.0000 mg | ORAL_TABLET | Freq: Three times a day (TID) | ORAL | Status: DC | PRN

## 2018-12-04 MED ORDER — MORPHINE SULFATE (PF) 4 MG/ML IV SOLN
4.0000 mg | Freq: Once | INTRAVENOUS | Status: AC
  Administered 2018-12-04: 4 mg via INTRAVENOUS
  Filled 2018-12-04: qty 1

## 2018-12-04 MED ORDER — ACETAMINOPHEN 325 MG PO TABS
650.0000 mg | ORAL_TABLET | Freq: Four times a day (QID) | ORAL | Status: DC | PRN
  Administered 2018-12-04: 650 mg via ORAL
  Filled 2018-12-04: qty 2

## 2018-12-04 MED ORDER — ONDANSETRON HCL 4 MG/2ML IJ SOLN
INTRAMUSCULAR | Status: AC
  Filled 2018-12-04: qty 2

## 2018-12-04 MED ORDER — METOCLOPRAMIDE HCL 5 MG/ML IJ SOLN: 5.0000 mg | Freq: Three times a day (TID) | INTRAMUSCULAR | Status: DC | PRN

## 2018-12-04 MED ORDER — ASPIRIN 81 MG PO CHEW
81.0000 mg | CHEWABLE_TABLET | Freq: Every day | ORAL | Status: DC
  Administered 2018-12-04: 09:00:00 81 mg via ORAL
  Filled 2018-12-04: qty 1

## 2018-12-04 MED ORDER — 0.9 % SODIUM CHLORIDE (POUR BTL) OPTIME
TOPICAL | Status: DC | PRN
  Administered 2018-12-04: 1000 mL

## 2018-12-04 MED ORDER — CHLORHEXIDINE GLUCONATE 4 % EX LIQD: 60.0000 mL | Freq: Once | CUTANEOUS | Status: DC

## 2018-12-04 MED ORDER — PANTOPRAZOLE SODIUM 40 MG PO TBEC
40.0000 mg | DELAYED_RELEASE_TABLET | Freq: Every day | ORAL | Status: DC
  Administered 2018-12-04 – 2018-12-08 (×4): 40 mg via ORAL
  Filled 2018-12-04 (×4): qty 1

## 2018-12-04 MED ORDER — CEFAZOLIN SODIUM-DEXTROSE 2-4 GM/100ML-% IV SOLN
2.0000 g | INTRAVENOUS | Status: AC
  Administered 2018-12-04: 14:00:00 2 g via INTRAVENOUS
  Filled 2018-12-04: qty 100

## 2018-12-04 MED ORDER — LIDOCAINE 2% (20 MG/ML) 5 ML SYRINGE
INTRAMUSCULAR | Status: AC
  Filled 2018-12-04: qty 5

## 2018-12-04 MED ORDER — DOCUSATE SODIUM 100 MG PO CAPS
100.0000 mg | ORAL_CAPSULE | Freq: Two times a day (BID) | ORAL | Status: DC
  Administered 2018-12-04: 100 mg via ORAL
  Filled 2018-12-04: qty 1

## 2018-12-04 MED ORDER — ONDANSETRON HCL 4 MG/2ML IJ SOLN: 4.0000 mg | Freq: Four times a day (QID) | INTRAMUSCULAR | Status: DC | PRN

## 2018-12-04 MED ORDER — ONDANSETRON HCL 4 MG/2ML IJ SOLN
INTRAMUSCULAR | Status: DC | PRN
  Administered 2018-12-04: 4 mg via INTRAVENOUS

## 2018-12-04 MED ORDER — SODIUM CHLORIDE 0.9 % IV SOLN
INTRAVENOUS | Status: DC | PRN
  Administered 2018-12-04: 15 ug/min via INTRAVENOUS

## 2018-12-04 MED ORDER — ACETAMINOPHEN 325 MG PO TABS
650.0000 mg | ORAL_TABLET | Freq: Once | ORAL | Status: AC
  Administered 2018-12-04: 650 mg via ORAL
  Filled 2018-12-04: qty 2

## 2018-12-04 MED ORDER — CLOPIDOGREL BISULFATE 75 MG PO TABS
75.0000 mg | ORAL_TABLET | Freq: Every day | ORAL | Status: DC
  Administered 2018-12-04 – 2018-12-08 (×4): 75 mg via ORAL
  Filled 2018-12-04 (×5): qty 1

## 2018-12-04 MED ORDER — SODIUM CHLORIDE 0.9 % IV SOLN
INTRAVENOUS | Status: AC
  Administered 2018-12-04 (×2): via INTRAVENOUS

## 2018-12-04 MED ORDER — BUPIVACAINE-EPINEPHRINE 0.5% -1:200000 IJ SOLN
INTRAMUSCULAR | Status: DC | PRN
  Administered 2018-12-04: 30 mL

## 2018-12-04 MED ORDER — ONDANSETRON HCL 4 MG PO TABS: 4.0000 mg | ORAL_TABLET | Freq: Four times a day (QID) | ORAL | Status: DC | PRN

## 2018-12-04 MED ORDER — LEVOTHYROXINE SODIUM 50 MCG PO TABS
50.0000 ug | ORAL_TABLET | Freq: Every day | ORAL | Status: DC
  Administered 2018-12-04 – 2018-12-08 (×5): 50 ug via ORAL
  Filled 2018-12-04 (×5): qty 1

## 2018-12-04 MED ORDER — DEXAMETHASONE SODIUM PHOSPHATE 10 MG/ML IJ SOLN
INTRAMUSCULAR | Status: AC
  Filled 2018-12-04: qty 1

## 2018-12-04 MED ORDER — SUCCINYLCHOLINE CHLORIDE 200 MG/10ML IV SOSY
PREFILLED_SYRINGE | INTRAVENOUS | Status: AC
  Filled 2018-12-04: qty 10

## 2018-12-04 MED ORDER — ASPIRIN EC 325 MG PO TBEC
325.0000 mg | DELAYED_RELEASE_TABLET | Freq: Every day | ORAL | Status: DC
  Administered 2018-12-05 – 2018-12-08 (×4): 325 mg via ORAL
  Filled 2018-12-04 (×4): qty 1

## 2018-12-04 MED ORDER — PHENYLEPHRINE 40 MCG/ML (10ML) SYRINGE FOR IV PUSH (FOR BLOOD PRESSURE SUPPORT)
PREFILLED_SYRINGE | INTRAVENOUS | Status: AC
  Filled 2018-12-04: qty 10

## 2018-12-04 MED ORDER — ACETAMINOPHEN 10 MG/ML IV SOLN
INTRAVENOUS | Status: AC
  Filled 2018-12-04: qty 100

## 2018-12-04 MED ORDER — DEXAMETHASONE SODIUM PHOSPHATE 10 MG/ML IJ SOLN
INTRAMUSCULAR | Status: DC | PRN
  Administered 2018-12-04: 10 mg via INTRAVENOUS

## 2018-12-04 MED ORDER — ADULT MULTIVITAMIN W/MINERALS CH
1.0000 | ORAL_TABLET | Freq: Every day | ORAL | Status: DC
  Administered 2018-12-04 – 2018-12-08 (×4): 1 via ORAL
  Filled 2018-12-04 (×4): qty 1

## 2018-12-04 MED ORDER — INSULIN ASPART 100 UNIT/ML ~~LOC~~ SOLN
0.0000 [IU] | Freq: Three times a day (TID) | SUBCUTANEOUS | Status: DC
  Administered 2018-12-04: 1 [IU] via SUBCUTANEOUS

## 2018-12-04 MED ORDER — FENTANYL CITRATE (PF) 250 MCG/5ML IJ SOLN
INTRAMUSCULAR | Status: AC
  Filled 2018-12-04: qty 5

## 2018-12-04 MED ORDER — ONDANSETRON HCL 4 MG/5ML PO SOLN
4.0000 mg | Freq: Once | ORAL | Status: AC
  Administered 2018-12-04: 4 mg via ORAL

## 2018-12-04 MED ORDER — LINAGLIPTIN 5 MG PO TABS
5.0000 mg | ORAL_TABLET | Freq: Every day | ORAL | Status: DC
  Administered 2018-12-04 – 2018-12-08 (×4): 5 mg via ORAL
  Filled 2018-12-04 (×5): qty 1

## 2018-12-04 MED ORDER — DEXTROMETHORPHAN-GUAIFENESIN 10-100 MG/5ML PO SYRP: 5.0000 mL | ORAL_SOLUTION | Freq: Four times a day (QID) | ORAL | Status: DC | PRN

## 2018-12-04 MED ORDER — MIRTAZAPINE 15 MG PO TABS
30.0000 mg | ORAL_TABLET | Freq: Every day | ORAL | Status: DC
  Administered 2018-12-04 – 2018-12-07 (×4): 30 mg via ORAL
  Filled 2018-12-04 (×4): qty 2

## 2018-12-04 MED ORDER — PROPOFOL 10 MG/ML IV BOLUS
INTRAVENOUS | Status: DC | PRN
  Administered 2018-12-04: 100 mg via INTRAVENOUS

## 2018-12-04 MED ORDER — MENTHOL 3 MG MT LOZG: 1.0000 | LOZENGE | OROMUCOSAL | Status: DC | PRN

## 2018-12-04 MED ORDER — PROMETHAZINE HCL 25 MG/ML IJ SOLN
INTRAMUSCULAR | Status: AC
  Filled 2018-12-04: qty 1

## 2018-12-04 MED ORDER — FENTANYL CITRATE (PF) 250 MCG/5ML IJ SOLN
INTRAMUSCULAR | Status: DC | PRN
  Administered 2018-12-04: 25 ug via INTRAVENOUS
  Administered 2018-12-04: 50 ug via INTRAVENOUS
  Administered 2018-12-04 (×3): 25 ug via INTRAVENOUS

## 2018-12-04 MED ORDER — ALBUMIN HUMAN 5 % IV SOLN
INTRAVENOUS | Status: DC | PRN
  Administered 2018-12-04: 15:00:00 via INTRAVENOUS

## 2018-12-04 MED ORDER — MORPHINE SULFATE (PF) 2 MG/ML IV SOLN
1.0000 mg | INTRAVENOUS | Status: DC | PRN
  Administered 2018-12-04 – 2018-12-06 (×3): 1 mg via INTRAVENOUS
  Filled 2018-12-04 (×3): qty 1

## 2018-12-04 MED ORDER — BUPIVACAINE-EPINEPHRINE (PF) 0.5% -1:200000 IJ SOLN
INTRAMUSCULAR | Status: AC
  Filled 2018-12-04: qty 30

## 2018-12-04 MED ORDER — PHENOL 1.4 % MT LIQD: 1.0000 | OROMUCOSAL | Status: DC | PRN

## 2018-12-04 MED ORDER — ROCURONIUM BROMIDE 50 MG/5ML IV SOSY
PREFILLED_SYRINGE | INTRAVENOUS | Status: AC
  Filled 2018-12-04: qty 5

## 2018-12-04 MED ORDER — FENTANYL CITRATE (PF) 100 MCG/2ML IJ SOLN
50.0000 ug | Freq: Once | INTRAMUSCULAR | Status: AC
  Administered 2018-12-04: 50 ug via INTRAVENOUS
  Filled 2018-12-04: qty 2

## 2018-12-04 MED ORDER — EPHEDRINE 5 MG/ML INJ
INTRAVENOUS | Status: AC
  Filled 2018-12-04: qty 10

## 2018-12-04 MED ORDER — ROCURONIUM BROMIDE 10 MG/ML (PF) SYRINGE
PREFILLED_SYRINGE | INTRAVENOUS | Status: DC | PRN
  Administered 2018-12-04: 50 mg via INTRAVENOUS

## 2018-12-04 MED ORDER — PHENYLEPHRINE 40 MCG/ML (10ML) SYRINGE FOR IV PUSH (FOR BLOOD PRESSURE SUPPORT)
PREFILLED_SYRINGE | INTRAVENOUS | Status: DC | PRN
  Administered 2018-12-04 (×2): 80 ug via INTRAVENOUS
  Administered 2018-12-04 (×2): 120 ug via INTRAVENOUS

## 2018-12-04 MED ORDER — SUGAMMADEX SODIUM 200 MG/2ML IV SOLN
INTRAVENOUS | Status: DC | PRN
  Administered 2018-12-04: 200 mg via INTRAVENOUS

## 2018-12-04 MED ORDER — PROPOFOL 10 MG/ML IV BOLUS
INTRAVENOUS | Status: AC
  Filled 2018-12-04: qty 20

## 2018-12-04 SURGICAL SUPPLY — 42 items
BIT DRILL CANN LG 4.3MM (BIT) IMPLANT
BNDG COHESIVE 4X5 TAN STRL (GAUZE/BANDAGES/DRESSINGS) ×3 IMPLANT
CANISTER SUCT 3000ML PPV (MISCELLANEOUS) ×3 IMPLANT
COVER MAYO STAND STRL (DRAPES) ×3 IMPLANT
COVER PERINEAL POST (MISCELLANEOUS) ×3 IMPLANT
COVER SURGICAL LIGHT HANDLE (MISCELLANEOUS) ×3 IMPLANT
COVER WAND RF STERILE (DRAPES) ×3 IMPLANT
DRAPE C-ARM 42X72 X-RAY (DRAPES) ×3 IMPLANT
DRAPE STERI IOBAN 125X83 (DRAPES) ×3 IMPLANT
DRILL BIT CANN LG 4.3MM (BIT) ×3
DRSG PAD ABDOMINAL 8X10 ST (GAUZE/BANDAGES/DRESSINGS) ×3 IMPLANT
DURAPREP 26ML APPLICATOR (WOUND CARE) ×3 IMPLANT
ELECT REM PT RETURN 9FT ADLT (ELECTROSURGICAL) ×3
ELECTRODE REM PT RTRN 9FT ADLT (ELECTROSURGICAL) ×1 IMPLANT
EVACUATOR 1/8 PVC DRAIN (DRAIN) IMPLANT
GAUZE SPONGE 4X4 12PLY STRL (GAUZE/BANDAGES/DRESSINGS) ×3 IMPLANT
GAUZE XEROFORM 1X8 LF (GAUZE/BANDAGES/DRESSINGS) ×3 IMPLANT
GLOVE BIOGEL PI IND STRL 8 (GLOVE) ×2 IMPLANT
GLOVE BIOGEL PI INDICATOR 8 (GLOVE) ×4
GLOVE ORTHO TXT STRL SZ7.5 (GLOVE) ×6 IMPLANT
GOWN STRL REUS W/ TWL LRG LVL3 (GOWN DISPOSABLE) ×2 IMPLANT
GOWN STRL REUS W/TWL 2XL LVL3 (GOWN DISPOSABLE) ×3 IMPLANT
GOWN STRL REUS W/TWL LRG LVL3 (GOWN DISPOSABLE) ×4
GUIDEPIN 3.2X17.5 THRD DISP (PIN) ×2 IMPLANT
HIP FRAC NAIL LAG SCR 10.5X100 (Orthopedic Implant) ×2 IMPLANT
KIT BASIN OR (CUSTOM PROCEDURE TRAY) ×3 IMPLANT
KIT TURNOVER KIT B (KITS) ×3 IMPLANT
LINER BOOT UNIVERSAL DISP (MISCELLANEOUS) ×3 IMPLANT
MANIFOLD NEPTUNE II (INSTRUMENTS) ×3 IMPLANT
NAIL HIP FRACT 130D 11X180 (Screw) ×2 IMPLANT
NS IRRIG 1000ML POUR BTL (IV SOLUTION) ×3 IMPLANT
PACK GENERAL/GYN (CUSTOM PROCEDURE TRAY) ×3 IMPLANT
PAD ARMBOARD 7.5X6 YLW CONV (MISCELLANEOUS) ×6 IMPLANT
SCREW BONE CORTICAL 5.0X3 (Screw) ×2 IMPLANT
SCREW CANN THRD AFF 10.5X100 (Orthopedic Implant) IMPLANT
STAPLER VISISTAT 35W (STAPLE) ×3 IMPLANT
SUT VIC AB 0 CT1 27 (SUTURE) ×2
SUT VIC AB 0 CT1 27XBRD ANBCTR (SUTURE) ×1 IMPLANT
SUT VIC AB 2-0 CT1 27 (SUTURE) ×4
SUT VIC AB 2-0 CT1 TAPERPNT 27 (SUTURE) ×2 IMPLANT
TAPE CLOTH SURG 4X10 WHT LF (GAUZE/BANDAGES/DRESSINGS) ×2 IMPLANT
WATER STERILE IRR 1000ML POUR (IV SOLUTION) ×3 IMPLANT

## 2018-12-04 NOTE — ED Notes (Signed)
Pt vomiting upon arrival back to room from xray- new order for zofran received.

## 2018-12-04 NOTE — ED Triage Notes (Signed)
Pt reports she fell tonight, having pain with rotation and shortening to left hip.  Pt denies other injury

## 2018-12-04 NOTE — Consult Note (Signed)
Reason for Consult:left closed comminuted intertrochanteric hip fracture Referring Physician: Lacretia Nicks MD  Kristin Grant is an 78 y.o. female.  HPI: 78 year old female walker ambulation with past history of right hip fixation last year by me fell she states going to the bathroom although she has dementia and suffered a left comminuted intertrochanteric hip fracture which is closed angulated and displaced.  Previous right trochanteric nail fixation in December.  Patient is complaining of hip pain with any motion.  She not able ambulate as soon as she fell.  She does have history of diabetes although A1c is 6.3.  Coronary artery disease hypertension reflux.  Past Medical History:  Diagnosis Date   Alzheimer disease (HCC)    Arthritis    Coronary artery disease    Diabetes mellitus    Difficulty walking    Hypertension    Reflux    Stroke [facility])     Past Surgical History:  Procedure Laterality Date   APPENDECTOMY     FINGER DEBRIDEMENT     INTRAMEDULLARY (IM) NAIL INTERTROCHANTERIC Right 08/03/2018   Procedure: INTRAMEDULLARY (IM) NAIL INTERTROCHANTRIC;  Surgeon: Eldred Manges, MD;  Location: [facility] OR;  Service: Orthopedics;  Laterality: Right;    Family History  Problem Relation Age of Onset   Hypertension Mother    Heart disease Mother    Hypertension Father    Heart disease Father    Hypertension Sister    Heart disease Sister    Cancer Sister        BREAST    Hypertension Brother    Heart disease Brother     Social History:  reports that she has never smoked. She has never used smokeless tobacco. She reports that she does not drink alcohol or use drugs.  Allergies: No Known Allergies  Medications: I have reviewed the patient's current medications.  Results for orders placed or performed during the hospital encounter of 12/04/18 (from the past 48 hour(s))  CBC with Differential     Status: Abnormal   Collection Time: 12/04/18 12:39 AM  Result  Value Ref Range   WBC 11.9 (H) 4.0 - 10.5 K/uL   RBC 4.80 3.87 - 5.11 MIL/uL   Hemoglobin 13.0 12.0 - 15.0 g/dL   HCT 60.0 45.9 - 97.7 %   MCV 86.7 80.0 - 100.0 fL   MCH 27.1 26.0 - 34.0 pg   MCHC 31.3 30.0 - 36.0 g/dL   RDW 41.4 23.9 - 53.2 %   Platelets 157 150 - 400 K/uL   nRBC 0.0 0.0 - 0.2 %   Neutrophils Relative % 72 %   Neutro Abs 8.5 (H) 1.7 - 7.7 K/uL   Lymphocytes Relative 16 %   Lymphs Abs 1.9 0.7 - 4.0 K/uL   Monocytes Relative 8 %   Monocytes Absolute 0.9 0.1 - 1.0 K/uL   Eosinophils Relative 3 %   Eosinophils Absolute 0.4 0.0 - 0.5 K/uL   Basophils Relative 0 %   Basophils Absolute 0.1 0.0 - 0.1 K/uL   Immature Granulocytes 1 %   Abs Immature Granulocytes 0.16 (H) 0.00 - 0.07 K/uL    Comment: Performed at [facility], [address], [place], [place] [address]  Comprehensive metabolic panel     Status: Abnormal   Collection Time: 12/04/18 12:39 AM  Result Value Ref Range   Sodium 141 135 - 145 mmol/L   Potassium 3.6 3.5 - 5.1 mmol/L   Chloride 106 98 - 111 mmol/L   CO2 27 22 -  32 mmol/L   Glucose, Bld 144 (H) 70 - 99 mg/dL   BUN 17 8 - 23 mg/dL   Creatinine, Ser 1.610.73 0.44 - 1.00 mg/dL   Calcium 8.2 (L) 8.9 - 10.3 mg/dL   Total Protein 6.2 (L) 6.5 - 8.1 g/dL   Albumin 3.6 3.5 - 5.0 g/dL   AST 19 15 - 41 U/L   ALT 20 0 - 44 U/L   Alkaline Phosphatase 68 38 - 126 U/L   Total Bilirubin 0.4 0.3 - 1.2 mg/dL   GFR calc non Af Amer >60 >60 mL/min   GFR calc Af Amer >60 >60 mL/min   Anion gap 8 5 - 15    Comment: Performed at [facility], [address] [place], [place] [address]  [place]     Status: None   Collection Time: 12/04/18 12:39 AM  Result Value Ref Range   Total CK 46 38 - 234 U/L    Comment: Performed at [facility], [address] [place], [place] [address]  Surgical pcr screen     Status: None   Collection Time: 12/04/18  4:52 AM  Result Value Ref Range   MRSA, PCR NEGATIVE NEGATIVE   Staphylococcus aureus NEGATIVE NEGATIVE    Comment:  (NOTE) The Xpert SA Assay (FDA approved for NASAL specimens in patients 80 years of age and older), is one component of a comprehensive surveillance program. It is not intended to diagnose infection nor to guide or monitor treatment. Performed at [facility], [address] [place], [place] [address]   Glucose, capillary     Status: Abnormal   Collection Time: 12/04/18  4:56 AM  Result Value Ref Range   Glucose-Capillary 147 (H) 70 - 99 mg/dL  Glucose, capillary     Status: Abnormal   Collection Time: 12/04/18  7:56 AM  Result Value Ref Range   Glucose-Capillary 130 (H) 70 - 99 mg/dL  Protime-INR     Status: None   Collection Time: 12/04/18  8:00 AM  Result Value Ref Range   Prothrombin Time 15.0 11.4 - 15.2 seconds   INR 1.2 0.8 - 1.2    Comment: (NOTE) INR goal varies based on device and disease states. Performed at [facility], [address] [place], [place] [address]   APTT     Status: None   Collection Time: 12/04/18  8:00 AM  Result Value Ref Range   aPTT 30 24 - 36 seconds    Comment: Performed at [facility], [address] [place], [place] [address]  Hemoglobin A1c     Status: Abnormal   Collection Time: 12/04/18  8:00 AM  Result Value Ref Range   Hgb A1c MFr Bld 6.3 (H) 4.8 - 5.6 %    Comment: (NOTE) Pre diabetes:          5.7%-6.4% Diabetes:              >6.4% Glycemic control for   <7.0% adults with diabetes    Mean Plasma Glucose 134.11 mg/dL    Comment: Performed at [facility], [address] [place], [place] [address]    Dg Chest 1 View  Result Date: 12/04/2018 CLINICAL DATA:  Initial evaluation for acute trauma, hip fracture. EXAM: CHEST  1 VIEW COMPARISON:  Prior radiograph from 08/03/2018 FINDINGS: Transverse heart size within normal limits. Mediastinal silhouette normal. Lungs hypoinflated. Associated mild left basilar subsegmental atelectasis. No focal infiltrates. No edema or effusion. No  pneumothorax. Chronic  deformity about the bilateral humeral heads, could be degenerative and/or related to remote trauma. No acute osseous abnormality. Osteopenia. IMPRESSION: 1. Shallow lung inflation with mild left basilar subsegmental atelectasis. 2. No other active cardiopulmonary disease. Electronically Signed   By: Benjamin  McClintock M.D.   On: 12/04/2018 01:45  ° °Dg Hip Unilat W Or Wo Pelvis 2-3 Views Left ° °Result Date: 12/04/2018 °CLINICAL DATA:  Initial evaluation for possible hip fracture. EXAM: DG HIP (WITH OR WITHOUT PELVIS) 2-3V LEFT COMPARISON:  None. FINDINGS: Acute comminuted intertrochanteric fracture of the left hip with mild displacement. Femoral head remains normally position within the acetabulum. Cortical irregularity through the left superior and inferior pubic rami suspicious for associated fractures. No pubic diastasis. SI joints approximated. Fixation rod in place within the right hip/femur. Diffuse osteopenia. No acute soft tissue abnormality. Vascular calcifications noted. IMPRESSION: 1. Acute comminuted intertrochanteric fracture of the left hip. 2. Cortical irregularity through the left superior and inferior pubic rami, suspicious for associated pelvic fractures. Electronically Signed   By: Benjamin  McClintock M.D.   On: 12/04/2018 01:43  ° ° °ROS 14 point view of systems not well obtained through patient due to Alzheimer's disease chart review and update from 08/03/2018 consult.  Of note his coronary disease, diabetes with history of peripheral neuropathy, hypertension, reflux, previous CVA.  Patient does not smoke or drink.  Previous right proximal humerus fracture.  Trochanteric nail December °Blood pressure 108/77, pulse 80, temperature (!) 97.5 °F (36.4 °C), temperature source Oral, resp. rate 17, height 5' 4" (1.626 m), weight 57.5 kg, SpO2 100 %. °Physical Exam  °Constitutional: She appears well-developed and well-nourished.  °HENT:  °Head: Normocephalic and atraumatic.  °Eyes: Pupils are equal,  round, and reactive to light. Conjunctivae are normal.  °Neck: Normal range of motion. Neck supple. No tracheal deviation present. No thyromegaly present.  °Cardiovascular: Normal rate.  °Respiratory: No respiratory distress. She has no wheezes.  °GI: Soft. She exhibits no distension. There is no abdominal tenderness.  °Musculoskeletal:  °   Comments: Left lower extremity shortened 2 inches actually rotated.  Distal pulses are intact sciatic function is intact.  Pain with any hip range of motion.  No knee effusion.  Well-healed scars right hip from previous drug nail.  Good capillary refill.  No pitting edema.  ° ° °Assessment/Plan: °Comminuted displaced left intertrochanteric hip fracture.  Phone call 10×2 to talk with son is power of attorney unsuccessful I left a message for him to call back.  I discussed with him previously in December fixation of the opposite hip.  Talked with her other child which is Betty and explained outlined treatment plan.  She will try to reach the son Rick and have them call me back on my cell.  Plan procedure around 1 or 1:30 PM.  She has multiple medical problems increased risk similar to December 2019 fixation of right hip fracture.  She was at a skilled facility ambulates with a walker.  Fracture is comminuted but is close injury.  Plan choice anesthesia and identical hardware fixation of the left hip that was done on the right. ° °Amel Gianino C Mattisyn Cardona °12/04/2018, 9:23 AM  ° ° ° ° °

## 2018-12-04 NOTE — Anesthesia Preprocedure Evaluation (Addendum)
Anesthesia Evaluation  Patient identified by MRN, date of birth, ID band Patient awake    Reviewed: Allergy & Precautions, NPO status , Patient's Chart, lab work & pertinent test results  History of Anesthesia Complications Negative for: history of anesthetic complications  Airway Mallampati: II  TM Distance: >3 FB Neck ROM: Full    Dental  (+) Dental Advisory Given   Pulmonary COPD,  COPD inhaler,    breath sounds clear to auscultation       Cardiovascular hypertension, Pt. on medications and Pt. on home beta blockers (-) CAD  Rhythm:Regular Rate:Normal  '19 ECHO: 55% to 60%, valves OK   Neuro/Psych PSYCHIATRIC DISORDERS Dementia CVA    GI/Hepatic Neg liver ROS, GERD  Medicated and Controlled,  Endo/Other  diabetes, Insulin DependentHypothyroidism   Renal/GU negative Renal ROS     Musculoskeletal  (+) Arthritis ,   Abdominal   Peds  Hematology negative hematology ROS (+)   Anesthesia Other Findings   Reproductive/Obstetrics                            Anesthesia Physical Anesthesia Plan  ASA: III  Anesthesia Plan: General   Post-op Pain Management:    Induction: Intravenous  PONV Risk Score and Plan: 3 and Ondansetron, Dexamethasone and Treatment may vary due to age or medical condition  Airway Management Planned: Oral ETT  Additional Equipment:   Intra-op Plan:   Post-operative Plan: Extubation in OR  Informed Consent: I have reviewed the patients History and Physical, chart, labs and discussed the procedure including the risks, benefits and alternatives for the proposed anesthesia with the patient or authorized representative who has indicated his/her understanding and acceptance.     Dental advisory given and Consent reviewed with POA  Plan Discussed with: Surgeon and CRNA  Anesthesia Plan Comments: (Discussed with son, Meital Merrett, by phone)       Anesthesia  Quick Evaluation

## 2018-12-04 NOTE — ED Notes (Signed)
[facility] here for transport. Floor RN called and given report on this pt.

## 2018-12-04 NOTE — ED Provider Notes (Signed)
Emergency Department Provider Note   I have reviewed the triage vital signs and the nursing notes.   HISTORY  Chief Complaint Fall (hip injury)   HPI Kristin Grant is a 78 y.o. female with dementia and multiple medical problems as documented below who presents the emergency department today after an unwitnessed fall.  Patient was found on the ground with left hip pain and left leg deformity.  Patient states she members eat supper does not member how she fell.  The facility does not have any idea how she fell.  Patient states she does not hurt anywhere else besides around her left pelvis and left hip.  She states she does not have a headache or chest pain or back pain at this time.  No abdominal pain.   No other associated or modifying symptoms.    Past Medical History:  Diagnosis Date   Alzheimer disease (HCC)    Arthritis    Coronary artery disease    Diabetes mellitus    Difficulty walking    Hypertension    Reflux    Stroke [facility](HCC)     Patient Active Problem List   Diagnosis Date Noted   Intertrochanteric fracture of left hip (HCC) 12/04/2018   Intertrochanteric fracture of left hip, closed, initial encounter (HCC) 12/04/2018   Acute blood loss anemia 08/05/2018   Closed intertrochanteric fracture of hip, right, initial encounter (HCC) 08/03/2018   Malnutrition of moderate degree 08/03/2018   Coronary artery disease 11/17/2017   Thrombocytopenia (HCC) 11/17/2017   Right arm fracture 11/17/2017   Cerebral atrophy (HCC) 11/17/2017   Hypokalemia 11/17/2017   History of CVA (cerebrovascular accident) 11/15/2017   Suspected stroke patient last known to be well 3 to 4.5 hours ago    Closed fracture of proximal end of right humerus 09/18/17 10/16/2017   Osteoporosis 01/09/2017   Loss of weight 01/12/2016   Stroke-like episode (HCC) s/p IV tPA 11/22/2015   Diabetes mellitus, type II (HCC) 06/22/2014   Protein-calorie malnutrition (HCC) 12/15/2013     Fall 08/17/2013   Back pain 08/17/2013   Pressure ulcer, stage 1 07/06/2013   Abnormal development of nail 03/28/2013   Gait abnormality 12/10/2011   Dementia (HCC) 11/21/2011   Hypertension 11/21/2011   Hypothyroidism 11/21/2011    Past Surgical History:  Procedure Laterality Date   APPENDECTOMY     FINGER DEBRIDEMENT     INTRAMEDULLARY (IM) NAIL INTERTROCHANTERIC Right 08/03/2018   Procedure: INTRAMEDULLARY (IM) NAIL INTERTROCHANTRIC;  Surgeon: Eldred MangesYates, Mark C, MD;  Location: [facility] OR;  Service: Orthopedics;  Laterality: Right;    Current Outpatient Rx   Order #: [id number] Class: Historical Med   Order #: [id number] Class: Normal   Order #: [id number] Class: Normal   Order #: [id number] Class: Normal   Order #: [id number] Class: Historical Med   Order #: [id number] Class: Historical Med   Order #: [id number] Class: Normal   Order #: [id number] Class: Normal   Order #: [id number] Class: Normal   Order #: [id number] Class: Normal   Order #: [id number] Class: Normal   Order #: [id number] Class: Normal   Order #: [id number] Class: Normal   Order #: [id number] Class: Normal   Order #: [id number] Class: Normal    Allergies Patient has no known allergies.  Family History  Problem Relation Age of Onset   Hypertension Mother    Heart disease Mother    Hypertension Father    Heart disease Father    Hypertension Sister    Heart disease Sister    Cancer Sister  BREAST    Hypertension Brother    Heart disease Brother     Social History Social History   Tobacco Use   Smoking status: Never Smoker   Smokeless tobacco: Never Used  Substance Use Topics   Alcohol use: No   Drug use: No    Review of Systems  All other systems negative except as documented in the HPI. All pertinent positives and negatives as reviewed in the HPI. ____________________________________________   PHYSICAL EXAM:  VITAL SIGNS: ED Triage Vitals  Enc Vitals Group     BP  12/04/18 0016 (!) 156/76     Pulse Rate 12/04/18 0016 81     Resp 12/04/18 0016 17     Temp 12/04/18 0016 98.8 F (37.1 C)     Temp Source 12/04/18 0016 Oral     SpO2 12/04/18 0016 96 %     Weight 12/04/18 0017 138 lb (62.6 kg)     Height 12/04/18 0017 5\' 4"  (1.626 m)     Head Circumference --      Peak Flow --      Pain Score --      Pain Loc --      Pain Edu? --      Excl. in GC? --     Constitutional: Alert and oriented. Well appearing and in no acute distress. Eyes: Conjunctivae are normal. PERRL. EOMI. Head: Atraumatic. Nose: No congestion/rhinnorhea. Mouth/Throat: Mucous membranes are moist.  Oropharynx non-erythematous. Neck: No stridor.  No meningeal signs.   Cardiovascular: Normal rate, regular rhythm. Good peripheral circulation. Grossly normal heart sounds.   Respiratory: Normal respiratory effort.  No retractions. Lungs CTAB. Gastrointestinal: Soft and nontender. No distention.  Musculoskeletal: No lower extremity tenderness nor edema. No gross deformities of extremities. LLE with shortening, external rotation, pain with ROM. Pain with palpation of left anterior hip. No cervical/lumbar/thoracic pain. No pain with palpation of RLE and BUE. No pain with AP or lateral compression of ribs.  Neurologic:  Normal speech and language. Significant left eye lateral deviation. Moves both toes/feet symmetrically, moves both hands symmetrically. Symmetrical smile and eyebrow raising.  Skin:  Skin is warm, dry and intact. No rash noted.  ____________________________________________   LABS (all labs ordered are listed, but only abnormal results are displayed)  Labs Reviewed  CBC WITH DIFFERENTIAL/PLATELET - Abnormal; Notable for the following components:      Result Value   WBC 11.9 (*)    Neutro Abs 8.5 (*)    Abs Immature Granulocytes 0.16 (*)    All other components within normal limits  COMPREHENSIVE METABOLIC PANEL - Abnormal; Notable for the following components:    Glucose, Bld 144 (*)    Calcium 8.2 (*)    Total Protein 6.2 (*)    All other components within normal limits  CK  URINALYSIS, ROUTINE W REFLEX MICROSCOPIC   ____________________________________________  EKG   EKG Interpretation  Date/Time:    Ventricular Rate:    PR Interval:    QRS Duration:   QT Interval:    QTC Calculation:   R Axis:     Text Interpretation:         ____________________________________________  RADIOLOGY  Dg Chest 1 View  Result Date: 12/04/2018 CLINICAL DATA:  Initial evaluation for acute trauma, hip fracture. EXAM: CHEST  1 VIEW COMPARISON:  Prior radiograph from 08/03/2018 FINDINGS: Transverse heart size within normal limits. Mediastinal silhouette normal. Lungs hypoinflated. Associated mild left basilar subsegmental atelectasis. No focal infiltrates. No edema or effusion.  No pneumothorax. Chronic deformity about the bilateral humeral heads, could be degenerative and/or related to remote trauma. No acute osseous abnormality. Osteopenia. IMPRESSION: 1. Shallow lung inflation with mild left basilar subsegmental atelectasis. 2. No other active cardiopulmonary disease. Electronically Signed   By: Rise Mu M.D.   On: 12/04/2018 01:45   Dg Hip Unilat W Or Wo Pelvis 2-3 Views Left  Result Date: 12/04/2018 CLINICAL DATA:  Initial evaluation for possible hip fracture. EXAM: DG HIP (WITH OR WITHOUT PELVIS) 2-3V LEFT COMPARISON:  None. FINDINGS: Acute comminuted intertrochanteric fracture of the left hip with mild displacement. Femoral head remains normally position within the acetabulum. Cortical irregularity through the left superior and inferior pubic rami suspicious for associated fractures. No pubic diastasis. SI joints approximated. Fixation rod in place within the right hip/femur. Diffuse osteopenia. No acute soft tissue abnormality. Vascular calcifications noted. IMPRESSION: 1. Acute comminuted intertrochanteric fracture of the left hip. 2. Cortical  irregularity through the left superior and inferior pubic rami, suspicious for associated pelvic fractures. Electronically Signed   By: Rise Mu M.D.   On: 12/04/2018 01:43    ____________________________________________   PROCEDURES  Procedure(s) performed:   Procedures   ____________________________________________   INITIAL IMPRESSION / ASSESSMENT AND PLAN / ED COURSE  Likely hip fx.  Hip fx. No other abnormalities aside from superior/inferior pubic rami fractures on same side.   Discussed with Dr. Ophelia Charter with [facility] as he was previous Careers adviser. Will keep NPO, transfer to [facility] for likely surgery.   Attempted to call son without success.   Pertinent labs & imaging results that were available during my care of the patient were reviewed by me and considered in my medical decision making (see chart for details).  ____________________________________________  FINAL CLINICAL IMPRESSION(S) / ED DIAGNOSES  Final diagnoses:  Fall     MEDICATIONS GIVEN DURING THIS VISIT:  Medications  morphine 2 MG/ML injection 1 mg (has no administration in time range)  fentaNYL (SUBLIMAZE) injection 50 mcg (50 mcg Intravenous Given 12/04/18 0030)  ondansetron (ZOFRAN) 4 MG/2ML injection (  Given 12/04/18 0138)  ondansetron (ZOFRAN) 4 MG/5ML solution 4 mg (4 mg Oral Given 12/04/18 0123)  acetaminophen (TYLENOL) tablet 650 mg (650 mg Oral Given 12/04/18 0202)  morphine 4 MG/ML injection 4 mg (4 mg Intravenous Given 12/04/18 0202)     NEW OUTPATIENT MEDICATIONS STARTED DURING THIS VISIT:  New Prescriptions   No medications on file    Note:  This note was prepared with assistance of Dragon voice recognition software. Occasional wrong-word or sound-a-like substitutions may have occurred due to the inherent limitations of voice recognition software.   Shanin Szymanowski, Barbara Cower, MD 12/04/18 [phone]

## 2018-12-04 NOTE — Progress Notes (Signed)
Initial Nutrition Assessment  RD working remotely.  DOCUMENTATION CODES:   Not applicable  INTERVENTION:   - Add Ensure Enlive BID (each provides 350 kcal, 20 g protein) when diet advances post-op   NUTRITION DIAGNOSIS:   Increased nutrient needs related to post-op healing as evidenced by estimated needs.  GOAL:   Patient will meet greater than or equal to 90% of their needs  MONITOR:   PO intake, Supplement acceptance, Diet advancement, Weight trends, Labs  REASON FOR ASSESSMENT:   Malnutrition Screening Tool    ASSESSMENT:   78 yo female, admitted with hip fracture. Plan for hardware fixation to L hip. PMH Alzheimer disease, CAD, DM, HTN, reflux, h/o stroke.  Labs: glucose 144 mg/dL, G8Q 7.6%  Meds: Colace, ferrous sulfate, Novolog, levothyroxine, Remeron, MVI with minerals, Protonix EC, propranolol, IVF at 75 mL/hr  RD unable to reach patient via phone this morning, pt in OR on second attempt. Per chart review, pt with mild wt loss x 6 months, unknown PO intake PTA. Will order Ensure Enlive BID and continue to monitor adequacy of intake.  NUTRITION - FOCUSED PHYSICAL EXAM: Deferred - RD working remotely  Diet Order:  No known food allergies Diet Order            Diet NPO time specified  Diet effective now            No meals recorded  EDUCATION NEEDS:  No education needs have been identified at this time  Skin:  Skin Assessment: Skin Integrity Issues: Skin Integrity Issues:: Other (Comment) Other: Ecchymosis - arm, abdomen  Last BM:  PTA  Height:  Ht Readings from Last 1 Encounters:  12/04/18 5\' 4"  (1.626 m)    Weight:  Wt Readings from Last 10 Encounters:  12/04/18 57.5 kg  10/06/18 54.6 kg  09/28/18 55.4 kg  09/23/18 55.7 kg  08/14/18 60.8 kg  08/03/18 62.6 kg  06/04/18 60.8 kg  3.3 kg wt loss x 6 months = 5%, not significant  Ideal Body Weight:  54.5 kg  BMI:  Body mass index is 21.76 kg/m., considered underweight for advanced  age  Estimated Nutritional Needs:   Kcal:  1450-1740 (25-30 kcal/kg)  Protein:  70-87 gm (1.2-1.5 g/kg)  Fluid:  1 mL/kcal or per MD  Jolaine Artist, MS, RDN, LDN Pager: [phone] Available Mondays and Fridays, 9am-2pm

## 2018-12-04 NOTE — Interval H&P Note (Signed)
History and Physical Interval Note:  12/04/2018 12:55 PM  Kristin Grant  has presented today for surgery, with the diagnosis of Left intertrochanteric fracture..  The various methods of treatment have been discussed with the patient and family. After consideration of risks, benefits and other options for treatment, the patient has consented to  Procedure(s): INTRAMEDULLARY (IM) NAIL INTERTROCHANTRIC (Left) as a surgical intervention.  The patient's history has been reviewed, patient examined, no change in status, stable for surgery.  I have reviewed the patient's chart and labs.  Questions were answered to the patient's satisfaction.     Eldred Manges

## 2018-12-04 NOTE — H&P (View-Only) (Signed)
Reason for Consult:left closed comminuted intertrochanteric hip fracture Referring Physician: Lacretia Nicks MD  Kristin Grant is an 78 y.o. female.  HPI: 78 year old female walker ambulation with past history of right hip fixation last year by me fell she states going to the bathroom although she has dementia and suffered a left comminuted intertrochanteric hip fracture which is closed angulated and displaced.  Previous right trochanteric nail fixation in December.  Patient is complaining of hip pain with any motion.  She not able ambulate as soon as she fell.  She does have history of diabetes although A1c is 6.3.  Coronary artery disease hypertension reflux.  Past Medical History:  Diagnosis Date   Alzheimer disease (HCC)    Arthritis    Coronary artery disease    Diabetes mellitus    Difficulty walking    Hypertension    Reflux    Stroke [facility])     Past Surgical History:  Procedure Laterality Date   APPENDECTOMY     FINGER DEBRIDEMENT     INTRAMEDULLARY (IM) NAIL INTERTROCHANTERIC Right 08/03/2018   Procedure: INTRAMEDULLARY (IM) NAIL INTERTROCHANTRIC;  Surgeon: Eldred Manges, MD;  Location: [facility] OR;  Service: Orthopedics;  Laterality: Right;    Family History  Problem Relation Age of Onset   Hypertension Mother    Heart disease Mother    Hypertension Father    Heart disease Father    Hypertension Sister    Heart disease Sister    Cancer Sister        BREAST    Hypertension Brother    Heart disease Brother     Social History:  reports that she has never smoked. She has never used smokeless tobacco. She reports that she does not drink alcohol or use drugs.  Allergies: No Known Allergies  Medications: I have reviewed the patient's current medications.  Results for orders placed or performed during the hospital encounter of 12/04/18 (from the past 48 hour(s))  CBC with Differential     Status: Abnormal   Collection Time: 12/04/18 12:39 AM  Result  Value Ref Range   WBC 11.9 (H) 4.0 - 10.5 K/uL   RBC 4.80 3.87 - 5.11 MIL/uL   Hemoglobin 13.0 12.0 - 15.0 g/dL   HCT 60.0 45.9 - 97.7 %   MCV 86.7 80.0 - 100.0 fL   MCH 27.1 26.0 - 34.0 pg   MCHC 31.3 30.0 - 36.0 g/dL   RDW 41.4 23.9 - 53.2 %   Platelets 157 150 - 400 K/uL   nRBC 0.0 0.0 - 0.2 %   Neutrophils Relative % 72 %   Neutro Abs 8.5 (H) 1.7 - 7.7 K/uL   Lymphocytes Relative 16 %   Lymphs Abs 1.9 0.7 - 4.0 K/uL   Monocytes Relative 8 %   Monocytes Absolute 0.9 0.1 - 1.0 K/uL   Eosinophils Relative 3 %   Eosinophils Absolute 0.4 0.0 - 0.5 K/uL   Basophils Relative 0 %   Basophils Absolute 0.1 0.0 - 0.1 K/uL   Immature Granulocytes 1 %   Abs Immature Granulocytes 0.16 (H) 0.00 - 0.07 K/uL    Comment: Performed at [facility], [address], [place], [place] [address]  Comprehensive metabolic panel     Status: Abnormal   Collection Time: 12/04/18 12:39 AM  Result Value Ref Range   Sodium 141 135 - 145 mmol/L   Potassium 3.6 3.5 - 5.1 mmol/L   Chloride 106 98 - 111 mmol/L   CO2 27 22 -  32 mmol/L   Glucose, Bld 144 (H) 70 - 99 mg/dL   BUN 17 8 - 23 mg/dL   Creatinine, Ser 1.610.73 0.44 - 1.00 mg/dL   Calcium 8.2 (L) 8.9 - 10.3 mg/dL   Total Protein 6.2 (L) 6.5 - 8.1 g/dL   Albumin 3.6 3.5 - 5.0 g/dL   AST 19 15 - 41 U/L   ALT 20 0 - 44 U/L   Alkaline Phosphatase 68 38 - 126 U/L   Total Bilirubin 0.4 0.3 - 1.2 mg/dL   GFR calc non Af Amer >60 >60 mL/min   GFR calc Af Amer >60 >60 mL/min   Anion gap 8 5 - 15    Comment: Performed at [facility], [address] [place], [place] [address]  [place]     Status: None   Collection Time: 12/04/18 12:39 AM  Result Value Ref Range   Total CK 46 38 - 234 U/L    Comment: Performed at [facility], [address] [place], [place] [address]  Surgical pcr screen     Status: None   Collection Time: 12/04/18  4:52 AM  Result Value Ref Range   MRSA, PCR NEGATIVE NEGATIVE   Staphylococcus aureus NEGATIVE NEGATIVE    Comment:  (NOTE) The Xpert SA Assay (FDA approved for NASAL specimens in patients 80 years of age and older), is one component of a comprehensive surveillance program. It is not intended to diagnose infection nor to guide or monitor treatment. Performed at [facility], [address] [place], [place] [address]   Glucose, capillary     Status: Abnormal   Collection Time: 12/04/18  4:56 AM  Result Value Ref Range   Glucose-Capillary 147 (H) 70 - 99 mg/dL  Glucose, capillary     Status: Abnormal   Collection Time: 12/04/18  7:56 AM  Result Value Ref Range   Glucose-Capillary 130 (H) 70 - 99 mg/dL  Protime-INR     Status: None   Collection Time: 12/04/18  8:00 AM  Result Value Ref Range   Prothrombin Time 15.0 11.4 - 15.2 seconds   INR 1.2 0.8 - 1.2    Comment: (NOTE) INR goal varies based on device and disease states. Performed at [facility], [address] [place], [place] [address]   APTT     Status: None   Collection Time: 12/04/18  8:00 AM  Result Value Ref Range   aPTT 30 24 - 36 seconds    Comment: Performed at [facility], [address] [place], [place] [address]  Hemoglobin A1c     Status: Abnormal   Collection Time: 12/04/18  8:00 AM  Result Value Ref Range   Hgb A1c MFr Bld 6.3 (H) 4.8 - 5.6 %    Comment: (NOTE) Pre diabetes:          5.7%-6.4% Diabetes:              >6.4% Glycemic control for   <7.0% adults with diabetes    Mean Plasma Glucose 134.11 mg/dL    Comment: Performed at [facility], [address] [place], [place] [address]    Dg Chest 1 View  Result Date: 12/04/2018 CLINICAL DATA:  Initial evaluation for acute trauma, hip fracture. EXAM: CHEST  1 VIEW COMPARISON:  Prior radiograph from 08/03/2018 FINDINGS: Transverse heart size within normal limits. Mediastinal silhouette normal. Lungs hypoinflated. Associated mild left basilar subsegmental atelectasis. No focal infiltrates. No edema or effusion. No  pneumothorax. Chronic  deformity about the bilateral humeral heads, could be degenerative and/or related to remote trauma. No acute osseous abnormality. Osteopenia. IMPRESSION: 1. Shallow lung inflation with mild left basilar subsegmental atelectasis. 2. No other active cardiopulmonary disease. Electronically Signed   By: Rise Mu M.D.   On: 12/04/2018 01:45   Dg Hip Unilat W Or Wo Pelvis 2-3 Views Left  Result Date: 12/04/2018 CLINICAL DATA:  Initial evaluation for possible hip fracture. EXAM: DG HIP (WITH OR WITHOUT PELVIS) 2-3V LEFT COMPARISON:  None. FINDINGS: Acute comminuted intertrochanteric fracture of the left hip with mild displacement. Femoral head remains normally position within the acetabulum. Cortical irregularity through the left superior and inferior pubic rami suspicious for associated fractures. No pubic diastasis. SI joints approximated. Fixation rod in place within the right hip/femur. Diffuse osteopenia. No acute soft tissue abnormality. Vascular calcifications noted. IMPRESSION: 1. Acute comminuted intertrochanteric fracture of the left hip. 2. Cortical irregularity through the left superior and inferior pubic rami, suspicious for associated pelvic fractures. Electronically Signed   By: Rise Mu M.D.   On: 12/04/2018 01:43    ROS 14 point view of systems not well obtained through patient due to Alzheimer's disease chart review and update from 08/03/2018 consult.  Of note his coronary disease, diabetes with history of peripheral neuropathy, hypertension, reflux, previous CVA.  Patient does not smoke or drink.  Previous right proximal humerus fracture.  Trochanteric nail December Blood pressure 108/77, pulse 80, temperature (!) 97.5 F (36.4 C), temperature source Oral, resp. rate 17, height  (1.626 m), weight 57.5 kg, SpO2 100 %. Physical Exam  Constitutional: She appears well-developed and well-nourished.  HENT:  Head: Normocephalic and atraumatic.  Eyes: Pupils are equal,  round, and reactive to light. Conjunctivae are normal.  Neck: Normal range of motion. Neck supple. No tracheal deviation present. No thyromegaly present.  Cardiovascular: Normal rate.  Respiratory: No respiratory distress. She has no wheezes.  GI: Soft. She exhibits no distension. There is no abdominal tenderness.  Musculoskeletal:     Comments: Left lower extremity shortened 2 inches actually rotated.  Distal pulses are intact sciatic function is intact.  Pain with any hip range of motion.  No knee effusion.  Well-healed scars right hip from previous drug nail.  Good capillary refill.  No pitting edema.    Assessment/Plan: Comminuted displaced left intertrochanteric hip fracture.  Phone call [phone] to talk with son is power of attorney unsuccessful I left a message for him to call back.  I discussed with him previously in December fixation of the opposite hip.  Talked with her other child which is Kathie Rhodes and explained outlined treatment plan.  She will try to reach the son Raiford Noble and have them call me back on my cell.  Plan procedure around 1 or 1:30 PM.  She has multiple medical problems increased risk similar to December 2019 fixation of right hip fracture.  She was at a skilled facility ambulates with a walker.  Fracture is comminuted but is close injury.  Plan choice anesthesia and identical hardware fixation of the left hip that was done on the right.  Eldred Manges 12/04/2018, 9:23 AM

## 2018-12-04 NOTE — Op Note (Signed)
Preop diagnosis: Comminuted left intertrochanteric hip fracture, closed.  Postop diagnosis: Same  Procedure: Left trochanteric nail with interlock for comminuted closed intertrochanteric hip fracture.  Surgeon: Annell Greening, MD  Anesthesia: General oral tracheal.  EBL 300cc  Implants 11 mm short Biomet affixes trochanteric nail with 34 mm distal interlock and 100 mm lag screw.  Procedure: After induction general anesthesia patient placed on the fracture table Hana table Hana boot well leg holder careful padding positioning left arm across the chest careful padding C arm was brought in arm arm was padded out of the way leg was distracted internally rotated until the patella was pointing at the ceiling reduction was obtained AP and lateral and the C-arm was removed hip was prepped preoperative Ancef prophylaxis timeout procedure was completed.  Patient had already had a fracture and I fixed her opposite right hip in December 2019 with similar device.  Incision was made laterally proximal to the trochanter tip was palpated.  Fracture line went almost to the starting point nail was placed after drilling the Steinmann pin overreaming briefly break the cortex and then inserting the nail pushing it medially to keep it aligned in good position.  It was advanced until pin was able to be passed center in the head AP and lateral center center with good position.  This was 130 mm nail.  Distal screw was then placed 34 mm bicortical tightly locked in.  Nail was locked proximally statically locking the compression screw.  The side arm was removed AP and lateral spot final pictures were taken showing good position alignment irrigation with saline solution.  Patient is on aspirin and Plavix and had some oozing despite use of the Bovie electrocautery for hemostasis.  Dressing was applied with 4 x 4's ABD and tape.  Patient tolerated the procedure well transfer the care of room in stable condition.

## 2018-12-04 NOTE — Anesthesia Procedure Notes (Signed)
Procedure Name: Intubation Date/Time: 12/04/2018 1:57 PM Performed by: Jearld Pies, CRNA Pre-anesthesia Checklist: Patient identified, Emergency Drugs available, Suction available and Patient being monitored Patient Re-evaluated:Patient Re-evaluated prior to induction Oxygen Delivery Method: Circle System Utilized Preoxygenation: Pre-oxygenation with 100% oxygen Induction Type: IV induction and Rapid sequence Laryngoscope Size: Mac and 3 Grade View: Grade I Tube type: Oral Tube size: 7.0 mm Number of attempts: 1 Airway Equipment and Method: Stylet and Oral airway Placement Confirmation: ETT inserted through vocal cords under direct vision,  positive ETCO2 and breath sounds checked- equal and bilateral Secured at: 20 cm Tube secured with: Tape Dental Injury: Teeth and Oropharynx as per pre-operative assessment

## 2018-12-04 NOTE — H&P (Signed)
TRH H&P    Patient Demographics:    Kristin Grant, is a 78 y.o. female  MRN: [id number]  DOB - [birth year]  Admit Date - 12/04/2018  Referring MD/NP/PA: Marily MemosJason Mesner  Outpatient Primary MD for the patient [facility], Velna HatchetKawanta F, MD  Patient coming from:  [facility]  Extended Emergency Contact Information Primary Emergency Contact: Tharon AquasGoins,Richard [place], [place] Mobile Phone: [phone] Relation: Son Secondary Emergency Contact: Lynwood DawleyZorn, Ann Mobile Phone: [phone] Relation: Daughter  Chief complaint- fall   HPI:    Kristin Grant  is a 78 y.o. female, with hypertension, dm2, hypothyroidism, h/o CVA, dementia, apparently fell at assisted living.  Its unclear how she fell. Pt states that she fell while getting out of bed to go to the bathroom, but she has dementia.   Pt denies any cp, palp, sob, n/v, diarrhea, brbpr, black stool, dysuria, but once again history is limited by her dementia,  She is axox1 (person), not place or time.    Pt was apparently a full code while at SNF. I attempted to contact her son, but got voicemail. Left message that [facility].    In ED,  T 98.8, afebrile,  Pt 65, Bp 119/59, pox 100% on RA  Xray:  IMPRESSION: 1. Acute comminuted intertrochanteric fracture of the left hip. 2. Cortical irregularity through the left superior and inferior pubic rami, suspicious for associated pelvic fractures.   Wbc 11.9, hgb 13.0, Plt 157 Na 141, K 3.6 Bun 17, Creatinine 0.73 Glucose 144  ED spoke with orthopedics and they [facility] L hip intertrochanteric fracture with possible left superior and inferior pubic rami fracture.     Review of systems:    In addition to the HPI above, (note limited by Dementia) No Fever-chills, No Headache, No changes with Vision or hearing, No problems  swallowing food or Liquids, No Chest pain, Cough or Shortness of Breath, No Abdominal pain, No Nausea or Vomiting, bowel movements are regular, No Blood in stool or Urine, No dysuria, No new skin rashes or bruises,  No new weakness, tingling, numbness in any extremity, No recent weight gain or loss, No polyuria, polydypsia or polyphagia, No significant Mental Stressors.  All other systems reviewed and are negative.    Past History of the following :    Past Medical History:  Diagnosis Date  . Alzheimer disease (HCC)   . Arthritis   . Coronary artery disease   . Diabetes mellitus   . Difficulty walking   . Hypertension   . Reflux   . Stroke [facility](HCC)       Past Surgical History:  Procedure Laterality Date  . APPENDECTOMY    . FINGER DEBRIDEMENT    . INTRAMEDULLARY (IM) NAIL INTERTROCHANTERIC Right 08/03/2018   Procedure: INTRAMEDULLARY (IM) NAIL INTERTROCHANTRIC;  Surgeon: Eldred MangesYates, Mark C, MD;  Location: [facility] OR;  Service: Orthopedics;  Laterality: Right;      Social History:      Social History  Tobacco Use  . Smoking status: Never Smoker  . Smokeless tobacco: Never Used  Substance Use Topics  . Alcohol use: No       Family History :     Family History  Problem Relation Age of Onset  . Hypertension Mother   . Heart disease Mother   . Hypertension Father   . Heart disease Father   . Hypertension Sister   . Heart disease Sister   . Cancer Sister        BREAST   . Hypertension Brother   . Heart disease Brother        Home Medications:   Prior to Admission medications   Medication Sig Start Date End Date Taking? Authorizing Provider  aspirin 81 MG chewable tablet Chew 81 mg by mouth daily. Starting 09/04/2018   Yes [provider]  clopidogrel (PLAVIX) 75 MG tablet TAKE 1 TABLET ( ) BY MOUTH ONCE DAILY. 12/08/17  Yes [place], Velna Hatchet, MD  docusate sodium (COLACE) 100 MG capsule Take 1 capsule (100 mg total) by mouth 2 (two) times daily.  08/06/18  Yes Alessandra Bevels, MD  Ferrous Sulfate (IRON) 325 (65 Fe) MG TABS TAKE 1 TABLET BY MOUTH ONCE DAILY. 11/27/18  Yes [place], Velna Hatchet, MD  insulin aspart (NOVOLOG FLEXPEN) 100 UNIT/ML FlexPen Inject 6 Units into the skin 2 (two) times daily.   Yes [provider]  insulin glargine (LANTUS) 100 UNIT/ML injection Inject 14 Units into the skin at bedtime.   Yes [provider]  levothyroxine (SYNTHROID, LEVOTHROID) 50 MCG tablet TAKE 1 TABLET BY MOUTH ONCE DAILY. 12/08/17  Yes [place], Velna Hatchet, MD  lisinopril (PRINIVIL,ZESTRIL) 40 MG tablet TAKE 1 TABLET BY MOUTH ONCE DAILY. 08/09/13  Yes [place], Velna Hatchet, MD  mirtazapine (REMERON) 30 MG tablet Take 1 tablet (30 mg total) by mouth at bedtime. 10/06/18  Yes [place], Velna Hatchet, MD  Multiple Vitamin (DAILY VITE) TABS TAKE 1 TABLET BY MOUTH ONCE DAILY. 12/08/17  Yes [place], Velna Hatchet, MD  NOVOFINE AUTOCOVER 30G X 8 MM MISC USE AS DIRECTED FOR INSULIN ADMINISTRATION 3 TIMES DAILY. 11/23/18  Yes [place], Velna Hatchet, MD  pantoprazole (PROTONIX) 40 MG tablet TAKE 1 TABLET BY MOUTH ONCE DAILY. 12/08/17  Yes [place], Velna Hatchet, MD  propranolol (INDERAL) 40 MG tablet TAKE 1 TABLET BY MOUTH TWICE DAILY. 08/09/13  Yes [place], Velna Hatchet, MD  TRADJENTA 5 MG TABS tablet TAKE 1 TABLET BY MOUTH ONCE DAILY. 12/08/17  Yes [place], Velna Hatchet, MD  acetaminophen (TYLENOL) 325 MG tablet Take 2 tablets (650 mg total) by mouth every 6 (six) hours as needed for mild pain, moderate pain or fever. 08/06/18   Alessandra Bevels, MD     Allergies:    No Known Allergies   Physical Exam:   Vitals  Blood pressure 124/64, pulse 68, temperature 98.8 F (37.1 C), temperature source Oral, resp. rate 20, height  (1.626 m), weight 62.6 kg, SpO2 99 %.  1.  General: axox1 (person), elderly female c/o left hip pain  2. Psychiatric: Euthymic  3. Neurologic: cn2-12 intact, reflexes 2+ symmetric, diffuse , downgoing toes bil, motor 5/5 in all 4 ext  4.  HEENMT:  Anicteric, pupils 1.78mm symmetric, direct, consensual , near intact  5. Respiratory : CTAB  6. Cardiovascular : rrr s1, s2, no m/g/r  7. Gastrointestinal:  Abd: soft, nt, nd, +bs  8. Skin:  No c/c/e,   9.Musculoskeletal:  Good ROM joints, left hip/ knee not tested  Data Review:    CBC Recent Labs  Lab 12/04/18 0039  WBC 11.9*  HGB 13.0  HCT 41.6  PLT 157  MCV 86.7  MCH 27.1  MCHC 31.3  RDW 14.8  LYMPHSABS 1.9  MONOABS 0.9  EOSABS 0.4  BASOSABS 0.1   ------------------------------------------------------------------------------------------------------------------  Results for orders placed or performed during the hospital encounter of 12/04/18 (from the past 48 hour(s))  CBC with Differential     Status: Abnormal   Collection Time: 12/04/18 12:39 AM  Result Value Ref Range   WBC 11.9 (H) 4.0 - 10.5 K/uL   RBC 4.80 3.87 - 5.11 MIL/uL   Hemoglobin 13.0 12.0 - 15.0 g/dL   HCT 16.1 09.6 - 04.5 %   MCV 86.7 80.0 - 100.0 fL   MCH 27.1 26.0 - 34.0 pg   MCHC 31.3 30.0 - 36.0 g/dL   RDW 40.9 81.1 - 91.4 %   Platelets 157 150 - 400 K/uL   nRBC 0.0 0.0 - 0.2 %   Neutrophils Relative % 72 %   Neutro Abs 8.5 (H) 1.7 - 7.7 K/uL   Lymphocytes Relative 16 %   Lymphs Abs 1.9 0.7 - 4.0 K/uL   Monocytes Relative 8 %   Monocytes Absolute 0.9 0.1 - 1.0 K/uL   Eosinophils Relative 3 %   Eosinophils Absolute 0.4 0.0 - 0.5 K/uL   Basophils Relative 0 %   Basophils Absolute 0.1 0.0 - 0.1 K/uL   Immature Granulocytes 1 %   Abs Immature Granulocytes 0.16 (H) 0.00 - 0.07 K/uL    Comment: Performed at [facility], [address], [place], [place] [address]  Comprehensive metabolic panel     Status: Abnormal   Collection Time: 12/04/18 12:39 AM  Result Value Ref Range   Sodium 141 135 - 145 mmol/L   Potassium 3.6 3.5 - 5.1 mmol/L   Chloride 106 98 - 111 mmol/L   CO2 27 22 - 32 mmol/L   Glucose, Bld 144 (H) 70 - 99 mg/dL   BUN 17 8 - 23 mg/dL   Creatinine, Ser  6.21 0.44 - 1.00 mg/dL   Calcium 8.2 (L) 8.9 - 10.3 mg/dL   Total Protein 6.2 (L) 6.5 - 8.1 g/dL   Albumin 3.6 3.5 - 5.0 g/dL   AST 19 15 - 41 U/L   ALT 20 0 - 44 U/L   Alkaline Phosphatase 68 38 - 126 U/L   Total Bilirubin 0.4 0.3 - 1.2 mg/dL   GFR calc non Af Amer >60 >60 mL/min   GFR calc Af Amer >60 >60 mL/min   Anion gap 8 5 - 15    Comment: Performed at [facility], [address], [place], [place] [address]  [place]     Status: None   Collection Time: 12/04/18 12:39 AM  Result Value Ref Range   Total CK 46 38 - 234 U/L    Comment: Performed at [facility], [address], [place], [place] [address]    Chemistries  Recent Labs  Lab 12/04/18 0039  NA 141  K 3.6  CL 106  CO2 27  GLUCOSE 144*  BUN 17  CREATININE 0.73  CALCIUM 8.2*  AST 19  ALT 20  ALKPHOS 68  BILITOT 0.4   ------------------------------------------------------------------------------------------------------------------  ------------------------------------------------------------------------------------------------------------------ GFR: Estimated Creatinine Clearance: 49.2 mL/min (by C-G formula based on SCr of 0.73 mg/dL). Liver Function Tests: Recent Labs  Lab 12/04/18 0039  AST 19  ALT 20  ALKPHOS 68  BILITOT 0.4  PROT  6.2*  ALBUMIN 3.6   No results for input(s): LIPASE, AMYLASE in the last 168 hours. No results for input(s): AMMONIA in the last 168 hours. Coagulation Profile: No results for input(s): INR, PROTIME in the last 168 hours. Cardiac Enzymes: Recent Labs  Lab 12/04/18 0039  CKTOTAL 46   BNP (last 3 results) No results for input(s): PROBNP in the last 8760 hours. HbA1C: No results for input(s): HGBA1C in the last 72 hours. CBG: No results for input(s): GLUCAP in the last 168 hours. Lipid Profile: No results for input(s): CHOL, HDL, LDLCALC, TRIG, CHOLHDL, LDLDIRECT in the last 72 hours. Thyroid Function Tests: No results for input(s): TSH, T4TOTAL, FREET4, T3FREE,  THYROIDAB in the last 72 hours. Anemia Panel: No results for input(s): VITAMINB12, FOLATE, FERRITIN, TIBC, IRON, RETICCTPCT in the last 72 hours.  --------------------------------------------------------------------------------------------------------------- Urine analysis:    Component Value Date/Time   COLORURINE YELLOW 08/04/2018 1147   APPEARANCEUR HAZY (A) 08/04/2018 1147   LABSPEC 1.021 08/04/2018 1147   PHURINE 5.0 08/04/2018 1147   GLUCOSEU NEGATIVE 08/04/2018 1147   HGBUR NEGATIVE 08/04/2018 1147   BILIRUBINUR NEGATIVE 08/04/2018 1147   KETONESUR NEGATIVE 08/04/2018 1147   PROTEINUR NEGATIVE 08/04/2018 1147   UROBILINOGEN 0.2 10/11/2013 0634   NITRITE NEGATIVE 08/04/2018 1147   LEUKOCYTESUR LARGE (A) 08/04/2018 1147      Imaging Results:    Dg Chest 1 View  Result Date: 12/04/2018 CLINICAL DATA:  Initial evaluation for acute trauma, hip fracture. EXAM: CHEST  1 VIEW COMPARISON:  Prior radiograph from 08/03/2018 FINDINGS: Transverse heart size within normal limits. Mediastinal silhouette normal. Lungs hypoinflated. Associated mild left basilar subsegmental atelectasis. No focal infiltrates. No edema or effusion. No pneumothorax. Chronic deformity about the bilateral humeral heads, could be degenerative and/or related to remote trauma. No acute osseous abnormality. Osteopenia. IMPRESSION: 1. Shallow lung inflation with mild left basilar subsegmental atelectasis. 2. No other active cardiopulmonary disease. Electronically Signed   By: Rise Mu M.D.   On: 12/04/2018 01:45   Dg Hip Unilat W Or Wo Pelvis 2-3 Views Left  Result Date: 12/04/2018 CLINICAL DATA:  Initial evaluation for possible hip fracture. EXAM: DG HIP (WITH OR WITHOUT PELVIS) 2-3V LEFT COMPARISON:  None. FINDINGS: Acute comminuted intertrochanteric fracture of the left hip with mild displacement. Femoral head remains normally position within the acetabulum. Cortical irregularity through the left superior  and inferior pubic rami suspicious for associated fractures. No pubic diastasis. SI joints approximated. Fixation rod in place within the right hip/femur. Diffuse osteopenia. No acute soft tissue abnormality. Vascular calcifications noted. IMPRESSION: 1. Acute comminuted intertrochanteric fracture of the left hip. 2. Cortical irregularity through the left superior and inferior pubic rami, suspicious for associated pelvic fractures. Electronically Signed   By: Rise Mu M.D.   On: 12/04/2018 01:43   EKG not yet performed though ordered in computer, unable to review   Assessment & Plan:    Principal Problem:   Intertrochanteric fracture of left hip (HCC) Active Problems:   Dementia (HCC)   Hypertension   Hypothyroidism   Diabetes mellitus, type II (HCC)   History of CVA (cerebrovascular accident)   Intertrochanteric fracture of left hip, closed, initial encounter (HCC)   Left intertrochanteric hip fracture NPO except medication Morphine sulfate 1mg  iv q4h prn  Orthopedics consulted by ED,   Dm2 Hold Tradjenta, Hold Novlog, Hold Lantus since NPO fsbs q4h , ISS  Hypertension Cont Propranolol 40mg  po bid Hold lisinopril for now since NPO  H/o CVA Cont aspirin, Cont  plavix  Hypothyroidism Cont Levothyroxine 50 micrograms po qday  Dementia Cont Remeron  po qhs  Gerd Cont Protonix  po qday    DVT Prophylaxis-   No lovenox for now since anticipate OR later today.   Defer to orthopedics, SCDs for now  AM Labs Ordered, also please review Full Orders  Family Communication: Admission, patients condition and plan of care including tests being ordered have been discussed with the patient who indicate understanding and agree with the plan and Code Status.  Code Status:  FULL CODE  Please contact son in AM regarding code status  Admission status: Observation/Inpatient: Based on patients clinical presentation and evaluation of above clinical data, I have made  determination that patient meets Inpatient criteria at this time.  Time spent in minutes : 70   Pearson Grippe M.D on 12/04/2018 at 2:47 AM

## 2018-12-04 NOTE — Progress Notes (Signed)
PROGRESS NOTE    Kristin Grant  UPJ:[id number] DOB: 04-05-1941 DOA: 12/04/2018 PCP: Salley Scarlet, MD  Brief Narrative:  Per HPI  Kristin Grant  is Kristin Grant 78 y.o. female, with hypertension, dm2, hypothyroidism, h/o CVA, dementia, apparently fell at assisted living.  Its unclear how she fell. Pt states that she fell while getting out of bed to go to the bathroom, but she has dementia.   Pt denies any cp, palp, sob, n/v, diarrhea, brbpr, black stool, dysuria, but once again history is limited by her dementia,  She is axox1 (person), not place or time.    Pt was apparently Ashraf Mesta full code while at SNF. I attempted to contact her son, but got voicemail. Left message that she will be admitted to [facility]    In ED,  T 98.8, afebrile,  Pt 65, Bp 119/59, pox 100% on RA  Xray:  IMPRESSION: 1. Acute comminuted intertrochanteric fracture of the left hip. 2. Cortical irregularity through the left superior and inferior pubic rami, suspicious for associated pelvic fractures.   Wbc 11.9, hgb 13.0, Plt 157 Na 141, K 3.6 Bun 17, Creatinine 0.73 Glucose 144  ED spoke with orthopedics and they requested admission to [facility] for L hip intertrochanteric fracture with possible left superior and inferior pubic rami fracture.   Assessment & Plan:   Principal Problem:   Intertrochanteric fracture of left hip (HCC) Active Problems:   Dementia (HCC)   Hypertension   Hypothyroidism   Diabetes mellitus, type II (HCC)   History of CVA (cerebrovascular accident)   Intertrochanteric fracture of left hip, closed, initial encounter (HCC)   Left intertrochanteric hip fracture  Possible Left Superior and Inferior Pubic Rami Fx Appreciate ortho assistance  NPO  RCRI 2-3 (hx CVA, CAD, and T2DM).  Discussed with daughter Kathie Rhodes) (unable to reach son who is POA).  At increased risk with surgery due to her age and comorbidities, but further testing unlikely to change management at this time.  Planning  for surgery later today.  Daughter expressed understanding.  Morphine sulfate 1mg  iv q4h prn   Dm2 Hold Tradjenta, Hold Novlog, Hold Lantus since NPO fsbs q4h , ISS  Hypertension Cont Propranolol 40mg  po bid Hold lisinopril for now since NPO  H/o CVA Cont aspirin, Cont plavix  Hypothyroidism Cont Levothyroxine 50 micrograms po qday  Dementia Cont Remeron 30mg  po qhs  Gerd Cont Protonix 40mg  po qday  DVT prophylaxis: SCD Code Status: full  Family Communication: daughter, Kathie Rhodes Disposition Plan: pending OR   Consultants:   orthopedics  Procedures:   none  Antimicrobials:  Anti-infectives (From admission, onward)   None     Subjective: "I was just walking and I fell" Kristin Grant&Ox1-2 (knew hospital, but thought [place] - knew April)  Objective: Vitals:   12/04/18 0300 12/04/18 0330 12/04/18 0424 12/04/18 0444  BP: 98/84 101/87 108/77   Pulse: 72 71 80   Resp: 19 16 17    Temp:   (!) 97.5 F (36.4 C)   TempSrc:   Oral   SpO2: 98% 100% 100%   Weight:    57.5 kg  Height:        Intake/Output Summary (Last 24 hours) at 12/04/2018 1108 Last data filed at 12/04/2018 0600 Gross per 24 hour  Intake 95.03 ml  Output -  Net 95.03 ml   Filed Weights   12/04/18 0017 12/04/18 0444  Weight: 62.6 kg 57.5 kg    Examination:  General exam: Appears calm and comfortable  Respiratory  system: Clear to auscultation. Respiratory effort normal. Cardiovascular system: S1 & S2 heard, RRR. Gastrointestinal system: Abdomen is nondistended, soft and nontender. Central nervous system: Alert and oriented. No focal neurological deficits. Extremities: left leg shortened and externally rotated Skin: No rashes, lesions or ulcers Psychiatry: Judgement and insight appear normal. Mood & affect appropriate.     Data Reviewed: I have personally reviewed following labs and imaging studies  CBC: Recent Labs  Lab 12/04/18 0039  WBC 11.9*  NEUTROABS 8.5*  HGB 13.0  HCT  41.6  MCV 86.7  PLT 157   Basic Metabolic Panel: Recent Labs  Lab 12/04/18 0039  NA 141  K 3.6  CL 106  CO2 27  GLUCOSE 144*  BUN 17  CREATININE 0.73  CALCIUM 8.2*   GFR: Estimated Creatinine Clearance: 49.2 mL/min (by C-G formula based on SCr of 0.73 mg/dL). Liver Function Tests: Recent Labs  Lab 12/04/18 0039  AST 19  ALT 20  ALKPHOS 68  BILITOT 0.4  PROT 6.2*  ALBUMIN 3.6   No results for input(s): LIPASE, AMYLASE in the last 168 hours. No results for input(s): AMMONIA in the last 168 hours. Coagulation Profile: Recent Labs  Lab 12/04/18 0800  INR 1.2   Cardiac Enzymes: Recent Labs  Lab 12/04/18 0039  CKTOTAL 46   BNP (last 3 results) No results for input(s): PROBNP in the last 8760 hours. HbA1C: Recent Labs    12/04/18 0800  HGBA1C 6.3*   CBG: Recent Labs  Lab 12/04/18 0456 12/04/18 0756  GLUCAP 147* 130*   Lipid Profile: No results for input(s): CHOL, HDL, LDLCALC, TRIG, CHOLHDL, LDLDIRECT in the last 72 hours. Thyroid Function Tests: No results for input(s): TSH, T4TOTAL, FREET4, T3FREE, THYROIDAB in the last 72 hours. Anemia Panel: No results for input(s): VITAMINB12, FOLATE, FERRITIN, TIBC, IRON, RETICCTPCT in the last 72 hours. Sepsis Labs: No results for input(s): PROCALCITON, LATICACIDVEN in the last 168 hours.  Recent Results (from the past 240 hour(s))  Surgical pcr screen     Status: None   Collection Time: 12/04/18  4:52 AM  Result Value Ref Range Status   MRSA, PCR NEGATIVE NEGATIVE Final   Staphylococcus aureus NEGATIVE NEGATIVE Final    Comment: (NOTE) The Xpert SA Assay (FDA approved for NASAL specimens in patients 12 years of age and older), is one component of Timia Casselman comprehensive surveillance program. It is not intended to diagnose infection nor to guide or monitor treatment. Performed at [facility], [address], [place], [place] [address]          Radiology Studies: Dg Chest 1 View  Result Date:  12/04/2018 CLINICAL DATA:  Initial evaluation for acute trauma, hip fracture. EXAM: CHEST  1 VIEW COMPARISON:  Prior radiograph from 08/03/2018 FINDINGS: Transverse heart size within normal limits. Mediastinal silhouette normal. Lungs hypoinflated. Associated mild left basilar subsegmental atelectasis. No focal infiltrates. No edema or effusion. No pneumothorax. Chronic deformity about the bilateral humeral heads, could be degenerative and/or related to remote trauma. No acute osseous abnormality. Osteopenia. IMPRESSION: 1. Shallow lung inflation with mild left basilar subsegmental atelectasis. 2. No other active cardiopulmonary disease. Electronically Signed   By: Rise Mu M.D.   On: 12/04/2018 01:45   Dg Hip Unilat W Or Wo Pelvis 2-3 Views Left  Result Date: 12/04/2018 CLINICAL DATA:  Initial evaluation for possible hip fracture. EXAM: DG HIP (WITH OR WITHOUT PELVIS) 2-3V LEFT COMPARISON:  None. FINDINGS: Acute comminuted intertrochanteric fracture of the left hip with mild  displacement. Femoral head remains normally position within the acetabulum. Cortical irregularity through the left superior and inferior pubic rami suspicious for associated fractures. No pubic diastasis. SI joints approximated. Fixation rod in place within the right hip/femur. Diffuse osteopenia. No acute soft tissue abnormality. Vascular calcifications noted. IMPRESSION: 1. Acute comminuted intertrochanteric fracture of the left hip. 2. Cortical irregularity through the left superior and inferior pubic rami, suspicious for associated pelvic fractures. Electronically Signed   By: Rise MuBenjamin  McClintock M.D.   On: 12/04/2018 01:43        Scheduled Meds: . aspirin  81 mg Oral Daily  . clopidogrel  75 mg Oral Daily  . docusate sodium  100 mg Oral BID  . ferrous sulfate  325 mg Oral Q breakfast  . insulin aspart  0-9 Units Subcutaneous TID WC  . levothyroxine  50 mcg Oral QAC breakfast  . linagliptin  5 mg Oral Daily   . mirtazapine  30 mg Oral QHS  . multivitamin with minerals  1 tablet Oral Daily  . pantoprazole  40 mg Oral Daily  . propranolol  40 mg Oral BID   Continuous Infusions: . sodium chloride 75 mL/hr at 12/04/18 0442     LOS: 0 days    Time spent: over 30 min    Lacretia Nicksaldwell Powell, MD [facility] Pager AMION  If 7PM-7AM, please contact night-coverage [email or web address] Password [email or web address] 12/04/2018, 11:08 AM

## 2018-12-04 NOTE — Transfer of Care (Signed)
Immediate Anesthesia Transfer of Care Note  Patient: Kristin Grant  Procedure(s) Performed: INTRAMEDULLARY (IM) NAIL INTERTROCHANTRIC (Left )  Patient Location: PACU  Anesthesia Type:General  Level of Consciousness: drowsy, patient cooperative and responds to stimulation  Airway & Oxygen Therapy: Patient Spontanous Breathing and Patient connected to face mask oxygen  Post-op Assessment: Report given to RN and Post -op Vital signs reviewed and stable  Post vital signs: Reviewed and stable  Last Vitals:  Vitals Value Taken Time  BP 109/59 12/04/2018  3:11 PM  Temp    Pulse 91 12/04/2018  3:13 PM  Resp 21 12/04/2018  3:13 PM  SpO2 100 % 12/04/2018  3:13 PM  Vitals shown include unvalidated device data.  Last Pain:  Vitals:   12/04/18 0830  TempSrc:   PainSc: 4       Patients Stated Pain Goal: 2 (12/04/18 0830)  Complications: No apparent anesthesia complications

## 2018-12-04 NOTE — Anesthesia Postprocedure Evaluation (Signed)
Anesthesia Post Note  Patient: Rian Weakland  Procedure(s) Performed: INTRAMEDULLARY (IM) NAIL INTERTROCHANTRIC (Left )     Patient location during evaluation: PACU Anesthesia Type: General Level of consciousness: awake and alert and patient cooperative Pain management: pain level controlled Vital Signs Assessment: post-procedure vital signs reviewed and stable Respiratory status: spontaneous breathing, nonlabored ventilation, respiratory function stable and patient connected to nasal cannula oxygen Cardiovascular status: blood pressure returned to baseline and stable Postop Assessment: no apparent nausea or vomiting Anesthetic complications: no    Last Vitals:  Vitals:   12/04/18 1730 12/04/18 1740  BP: (!) 103/44 (!) 114/50  Pulse: 72 71  Resp: 16 15  Temp:    SpO2: 100% 100%    Last Pain:  Vitals:   12/04/18 0830  TempSrc:   PainSc: 4                  Erick Murin,E. Roberta Angell

## 2018-12-05 ENCOUNTER — Inpatient Hospital Stay (HOSPITAL_COMMUNITY): Payer: Medicare Other

## 2018-12-05 LAB — GLUCOSE, CAPILLARY
Glucose-Capillary: 144 mg/dL — ABNORMAL HIGH (ref 70–99)
Glucose-Capillary: 147 mg/dL — ABNORMAL HIGH (ref 70–99)
Glucose-Capillary: 153 mg/dL — ABNORMAL HIGH (ref 70–99)
Glucose-Capillary: 160 mg/dL — ABNORMAL HIGH (ref 70–99)
Glucose-Capillary: 162 mg/dL — ABNORMAL HIGH (ref 70–99)
Glucose-Capillary: 171 mg/dL — ABNORMAL HIGH (ref 70–99)
Glucose-Capillary: 197 mg/dL — ABNORMAL HIGH (ref 70–99)

## 2018-12-05 LAB — HEMOGLOBIN AND HEMATOCRIT, BLOOD
HCT: 21.7 % — ABNORMAL LOW (ref 36.0–46.0)
HCT: 22.9 % — ABNORMAL LOW (ref 36.0–46.0)
Hemoglobin: 6.8 g/dL — CL (ref 12.0–15.0)
Hemoglobin: 7.4 g/dL — ABNORMAL LOW (ref 12.0–15.0)

## 2018-12-05 LAB — COMPREHENSIVE METABOLIC PANEL
ALT: 15 U/L (ref 0–44)
AST: 20 U/L (ref 15–41)
Albumin: 2.6 g/dL — ABNORMAL LOW (ref 3.5–5.0)
Alkaline Phosphatase: 43 U/L (ref 38–126)
Anion gap: 9 (ref 5–15)
BUN: 17 mg/dL (ref 8–23)
CO2: 25 mmol/L (ref 22–32)
Calcium: 7.5 mg/dL — ABNORMAL LOW (ref 8.9–10.3)
Chloride: 107 mmol/L (ref 98–111)
Creatinine, Ser: 0.79 mg/dL (ref 0.44–1.00)
GFR calc Af Amer: >60 mL/min (ref >60)
GFR calc non Af Amer: >60 mL/min (ref >60)
Glucose, Bld: 174 mg/dL — ABNORMAL HIGH (ref 70–99)
Potassium: 4.1 mmol/L (ref 3.5–5.1)
Sodium: 141 mmol/L (ref 135–145)
Total Bilirubin: 0.5 mg/dL (ref 0.3–1.2)
Total Protein: 4.4 g/dL — ABNORMAL LOW (ref 6.5–8.1)

## 2018-12-05 LAB — CBC
HCT: 24.4 % — ABNORMAL LOW (ref 36.0–46.0)
Hemoglobin: 7.6 g/dL — ABNORMAL LOW (ref 12.0–15.0)
MCH: 26.4 pg (ref 26.0–34.0)
MCHC: 31.1 g/dL (ref 30.0–36.0)
MCV: 84.7 fL (ref 80.0–100.0)
Platelets: 122 10*3/uL — ABNORMAL LOW (ref 150–400)
RBC: 2.88 MIL/uL — ABNORMAL LOW (ref 3.87–5.11)
RDW: 15.1 % (ref 11.5–15.5)
WBC: 14.9 10*3/uL — ABNORMAL HIGH (ref 4.0–10.5)
nRBC: 0 % (ref 0.0–0.2)

## 2018-12-05 LAB — PREPARE RBC (CROSSMATCH)

## 2018-12-05 MED ORDER — ACETAMINOPHEN 325 MG PO TABS
650.0000 mg | ORAL_TABLET | Freq: Four times a day (QID) | ORAL | Status: DC
  Administered 2018-12-05 – 2018-12-08 (×10): 650 mg via ORAL
  Filled 2018-12-05 (×10): qty 2

## 2018-12-05 MED ORDER — INSULIN ASPART 100 UNIT/ML ~~LOC~~ SOLN
0.0000 [IU] | Freq: Three times a day (TID) | SUBCUTANEOUS | Status: DC
  Administered 2018-12-05 (×2): 1 [IU] via SUBCUTANEOUS
  Administered 2018-12-06: 2 [IU] via SUBCUTANEOUS
  Administered 2018-12-06: 1 [IU] via SUBCUTANEOUS
  Administered 2018-12-07: 2 [IU] via SUBCUTANEOUS
  Administered 2018-12-07: 5 [IU] via SUBCUTANEOUS
  Administered 2018-12-07 – 2018-12-08 (×3): 2 [IU] via SUBCUTANEOUS

## 2018-12-05 MED ORDER — SODIUM CHLORIDE 0.9% IV SOLUTION: Freq: Once | INTRAVENOUS | Status: DC

## 2018-12-05 MED ORDER — OXYCODONE HCL 5 MG PO TABS
2.5000 mg | ORAL_TABLET | ORAL | Status: DC | PRN
  Administered 2018-12-05 – 2018-12-06 (×3): 2.5 mg via ORAL
  Filled 2018-12-05 (×3): qty 1

## 2018-12-05 MED ORDER — OXYCODONE HCL 5 MG PO TABS
5.0000 mg | ORAL_TABLET | ORAL | Status: DC | PRN
  Filled 2018-12-05: qty 1

## 2018-12-05 MED ORDER — INSULIN ASPART 100 UNIT/ML ~~LOC~~ SOLN: 0.0000 [IU] | Freq: Every day | SUBCUTANEOUS | Status: DC

## 2018-12-05 MED ORDER — POLYETHYLENE GLYCOL 3350 17 G PO PACK
17.0000 g | PACK | Freq: Every day | ORAL | Status: DC
  Administered 2018-12-05 – 2018-12-08 (×3): 17 g via ORAL
  Filled 2018-12-05 (×3): qty 1

## 2018-12-05 MED ORDER — LACTATED RINGERS IV BOLUS
500.0000 mL | Freq: Once | INTRAVENOUS | Status: AC
  Administered 2018-12-05: 500 mL via INTRAVENOUS

## 2018-12-05 NOTE — Progress Notes (Signed)
   12/05/18 1732  Vitals  Temp 100.2 F (37.9 C)  Temp Source Oral  Pulse Rate (!) 115  Resp 16  BP (!) 91/43  Oxygen Therapy  SpO2 98 %  O2 Device Nasal Cannula  O2 Flow Rate (L/min) 2 L/min  DR. Powell notified of VS/ Ordered to start blood.See other orders.,

## 2018-12-05 NOTE — Progress Notes (Signed)
Patient in pain. Medication given. Will continue to monitor patient

## 2018-12-05 NOTE — Progress Notes (Signed)
Hgb 6.8. Dr. Lowell Guitar notified and orders received.

## 2018-12-05 NOTE — Progress Notes (Addendum)
PROGRESS NOTE    Kristin Grant  ZOX:[id number] DOB: 02/27/1941 DOA: 12/04/2018 PCP: Salley Scarlet, MD  Brief Narrative:  Per HPI  Kristin Grant  is Kristin Grant 78 y.o. female, with hypertension, dm2, hypothyroidism, h/o CVA, dementia, apparently fell at assisted living.  Its unclear how she fell. Pt states that she fell while getting out of bed to go to the bathroom, but she has dementia.   Pt denies any cp, palp, sob, n/v, diarrhea, brbpr, black stool, dysuria, but once again history is limited by her dementia,  She is axox1 (person), not place or time.    Pt was apparently Kristin Grant full code while at SNF. I attempted to contact her son, but got voicemail. Left message that she will be admitted to [facility].    In ED,  T 98.8, afebrile,  Pt 65, Bp 119/59, pox 100% on RA  Xray:  IMPRESSION: 1. Acute comminuted intertrochanteric fracture of the left hip. 2. Cortical irregularity through the left superior and inferior pubic rami, suspicious for associated pelvic fractures.   Wbc 11.9, hgb 13.0, Plt 157 Na 141, K 3.6 Bun 17, Creatinine 0.73 Glucose 144  ED spoke with orthopedics and they requested admission to [facility] for L hip intertrochanteric fracture with possible left superior and inferior pubic rami fracture.   Assessment & Plan:   Principal Problem:   Intertrochanteric fracture of left hip (HCC) Active Problems:   Dementia (HCC)   Hypertension   Hypothyroidism   Diabetes mellitus, type II (HCC)   History of CVA (cerebrovascular accident)   Intertrochanteric fracture of left hip, closed, initial encounter (HCC)   Left intertrochanteric hip fracture  Possible Left Superior and Inferior Pubic Rami Fx Appreciate ortho assistance  S/p L IM nail on 4/10 PT/OT today Will need to discuss postop anticoagulation with Dr. Ophelia Charter, currently pt on ASA 325 daily APAP, celebrex, oxy 2.5 -5 mg prn, morphine for breakthrough  Post Operative Blood Loss Anemia: H/H down to 7.6 from  13.  Pt was on DAPT at time of surgery.  Follow PM H/H.  Transfuse for <7 or symptomatic. Continue iron  Dm2 Hold Tradjenta, Hold Novlog, Hold Lantus since NPO ACHS SSI   Hypertension Cont Propranolol  po bid Lisinopril on hold, pt with soft BP  H/o CVA Cont aspirin, Cont plavix  Hypothyroidism Cont Levothyroxine 50 micrograms po qday  Dementia Cont Remeron  po qhs  Gerd Cont Protonix  po qday  DVT prophylaxis: SCD Code Status: full  Family Communication: none at bedside - called daughter, no answer (unable to leave message) Disposition Plan: pending OR   Consultants:   orthopedics  Procedures:   none  Antimicrobials:  Anti-infectives (From admission, onward)   Start     Dose/Rate Route Frequency Ordered Stop   12/05/18 0600  ceFAZolin (ANCEF) IVPB 2g/100 mL premix     2 g 200 mL/hr over 30 Minutes Intravenous On call to O.R. 12/04/18 1300 12/04/18 1430     Subjective: Looking for glasses. Pain ok.  Objective: Vitals:   12/05/18 0133 12/05/18 0435 12/05/18 0717 12/05/18 1100  BP: (!) 91/48 (!) 98/50  (!) 94/53  Pulse: 92 100  92  Resp: 18 18    Temp: 98.2 F (36.8 C) 97.8 F (36.6 C)  98.6 F (37 C)  TempSrc: Oral Oral  Oral  SpO2: 97% 98%  (!) 89%  Weight:   62.4 kg   Height:        Intake/Output Summary (Last 24 hours)  at 12/05/2018 1253 Last data filed at 12/05/2018 0930 Gross per 24 hour  Intake 1536.24 ml  Output 500 ml  Net 1036.24 ml   Filed Weights   12/04/18 0017 12/04/18 0444 12/05/18 0717  Weight: 62.6 kg 57.5 kg 62.4 kg    Examination:  General: No acute distress. Cardiovascular: Heart sounds show Kristin Grant regular rate, and rhythm. Lungs: Clear to auscultation bilaterally . Abdomen: Soft, nontender, nondistended  Neurological: Alert and oriented 3. Moves all extremities 4. Cranial nerves II through XII grossly intact. Skin: Warm and dry. No rashes or lesions. Extremities: LLE with intact dressing   Data  Reviewed: I have personally reviewed following labs and imaging studies  CBC: Recent Labs  Lab 12/04/18 0039 12/05/18 0359  WBC 11.9* 14.9*  NEUTROABS 8.5*  --   HGB 13.0 7.6*  HCT 41.6 24.4*  MCV 86.7 84.7  PLT 157 122*   Basic Metabolic Panel: Recent Labs  Lab 12/04/18 0039 12/05/18 0359  NA 141 141  K 3.6 4.1  CL 106 107  CO2 27 25  GLUCOSE 144* 174*  BUN 17 17  CREATININE 0.73 0.79  CALCIUM 8.2* 7.5*   GFR: Estimated Creatinine Clearance: 49.2 mL/min (by C-G formula based on SCr of 0.79 mg/dL). Liver Function Tests: Recent Labs  Lab 12/04/18 0039 12/05/18 0359  AST 19 20  ALT 20 15  ALKPHOS 68 43  BILITOT 0.4 0.5  PROT 6.2* 4.4*  ALBUMIN 3.6 2.6*   No results for input(s): LIPASE, AMYLASE in the last 168 hours. No results for input(s): AMMONIA in the last 168 hours. Coagulation Profile: Recent Labs  Lab 12/04/18 0800  INR 1.2   Cardiac Enzymes: Recent Labs  Lab 12/04/18 0039  CKTOTAL 46   BNP (last 3 results) No results for input(s): PROBNP in the last 8760 hours. HbA1C: Recent Labs    12/04/18 0800  HGBA1C 6.3*   CBG: Recent Labs  Lab 12/04/18 2101 12/05/18 0020 12/05/18 0431 12/05/18 0819 12/05/18 1017  GLUCAP 173* 162* 171* 160* 153*   Lipid Profile: No results for input(s): CHOL, HDL, LDLCALC, TRIG, CHOLHDL, LDLDIRECT in the last 72 hours. Thyroid Function Tests: No results for input(s): TSH, T4TOTAL, FREET4, T3FREE, THYROIDAB in the last 72 hours. Anemia Panel: No results for input(s): VITAMINB12, FOLATE, FERRITIN, TIBC, IRON, RETICCTPCT in the last 72 hours. Sepsis Labs: No results for input(s): PROCALCITON, LATICACIDVEN in the last 168 hours.  Recent Results (from the past 240 hour(s))  Surgical pcr screen     Status: None   Collection Time: 12/04/18  4:52 AM  Result Value Ref Range Status   MRSA, PCR NEGATIVE NEGATIVE Final   Staphylococcus aureus NEGATIVE NEGATIVE Final    Comment: (NOTE) The Xpert SA Assay (FDA  approved for NASAL specimens in patients 80 years of age and older), is one component of Kristin Grant comprehensive surveillance program. It is not intended to diagnose infection nor to guide or monitor treatment. Performed at [facility], [address] [place], [place] [address]          Radiology Studies: Dg Chest 1 View  Result Date: 12/04/2018 CLINICAL DATA:  Initial evaluation for acute trauma, hip fracture. EXAM: CHEST  1 VIEW COMPARISON:  Prior radiograph from 08/03/2018 FINDINGS: Transverse heart size within normal limits. Mediastinal silhouette normal. Lungs hypoinflated. Associated mild left basilar subsegmental atelectasis. No focal infiltrates. No edema or effusion. No pneumothorax. Chronic deformity about the bilateral humeral heads, could be degenerative and/or related to remote trauma. No  acute osseous abnormality. Osteopenia. IMPRESSION: 1. Shallow lung inflation with mild left basilar subsegmental atelectasis. 2. No other active cardiopulmonary disease. Electronically Signed   By: Rise Mu M.D.   On: 12/04/2018 01:45   Dg C-arm 1-60 Min  Result Date: 12/04/2018 CLINICAL DATA:  Intraoperative imaging for fixation of Kristin Grant left intertrochanteric fracture the patient suffered in Kristin Grant fall earlier today. Initial encounter. EXAM: DG C-ARM 61-120 MIN; OPERATIVE LEFT HIP WITH PELVIS COMPARISON:  Plain films left hip earlier today. FINDINGS: Six fluoroscopic intraoperative spot views are provided and demonstrate placement of Kristin Grant hip screw and short intramedullary nail with Kristin Grant single distal screw for fixation of an intertrochanteric fracture. No acute abnormality. IMPRESSION: Intraoperative imaging for fixation of Kristin Grant left intertrochanteric fracture. Electronically Signed   By: Kristin Grant M.D.   On: 12/04/2018 16:04   Dg Hip Operative Unilat W Or W/o Pelvis Left  Result Date: 12/04/2018 CLINICAL DATA:  Intraoperative imaging for fixation of Kristin Grant left intertrochanteric fracture the  patient suffered in Kristin Grant fall earlier today. Initial encounter. EXAM: DG C-ARM 61-120 MIN; OPERATIVE LEFT HIP WITH PELVIS COMPARISON:  Plain films left hip earlier today. FINDINGS: Six fluoroscopic intraoperative spot views are provided and demonstrate placement of Kristin Grant hip screw and short intramedullary nail with Kristin Grant single distal screw for fixation of an intertrochanteric fracture. No acute abnormality. IMPRESSION: Intraoperative imaging for fixation of Kristin Grant left intertrochanteric fracture. Electronically Signed   By: Kristin Grant M.D.   On: 12/04/2018 16:04   Dg Hip Unilat W Or Wo Pelvis 2-3 Views Left  Result Date: 12/04/2018 CLINICAL DATA:  Initial evaluation for possible hip fracture. EXAM: DG HIP (WITH OR WITHOUT PELVIS) 2-3V LEFT COMPARISON:  None. FINDINGS: Acute comminuted intertrochanteric fracture of the left hip with mild displacement. Femoral head remains normally position within the acetabulum. Cortical irregularity through the left superior and inferior pubic rami suspicious for associated fractures. No pubic diastasis. SI joints approximated. Fixation rod in place within the right hip/femur. Diffuse osteopenia. No acute soft tissue abnormality. Vascular calcifications noted. IMPRESSION: 1. Acute comminuted intertrochanteric fracture of the left hip. 2. Cortical irregularity through the left superior and inferior pubic rami, suspicious for associated pelvic fractures. Electronically Signed   By: Rise Mu M.D.   On: 12/04/2018 01:43        Scheduled Meds: . aspirin EC  325 mg Oral Q breakfast  . celecoxib  200 mg Oral BID  . clopidogrel  75 mg Oral Daily  . docusate sodium  100 mg Oral BID  . feeding supplement (ENSURE ENLIVE)  237 mL Oral BID BM  . ferrous sulfate  325 mg Oral Q breakfast  . insulin aspart  0-5 Units Subcutaneous QHS  . insulin aspart  0-9 Units Subcutaneous TID WC  . levothyroxine  50 mcg Oral QAC breakfast  . linagliptin  5 mg Oral Daily  . mirtazapine   30 mg Oral QHS  . multivitamin with minerals  1 tablet Oral Daily  . pantoprazole  40 mg Oral Daily  . propranolol  40 mg Oral BID   Continuous Infusions:    LOS: 1 day    Time spent: over 30 min    Lacretia Nicks, MD [facility] Pager AMION  If 7PM-7AM, please contact night-coverage [email or web address] Password [facility] 12/05/2018, 12:53 PM

## 2018-12-05 NOTE — Progress Notes (Signed)
Orthopedic Tech Progress Note Patient Details:  Kristin Grant May 15, 1941 [id number] Applied Over Head Frame with Trapeze Patient ID: [name], female   DOB: October 04, 1940, 78 y.o.   MRN: [id number]   Donald Pore 12/05/2018, 7:38 AM

## 2018-12-05 NOTE — Progress Notes (Signed)
PT Cancellation Note  Patient Details Name: Kristin Grant MRN: [id number] DOB: 1940/12/19   Cancelled Treatment:    Reason Eval/Treat Not Completed: Medical issues which prohibited therapy.  Hgb 6.8 and orders for blood (not yet hung).  I spoke with RN who reports pt is confused and tachy.  PT to hold pt until tomorrow.    Thanks,  Rollene Rotunda. Jyair Kiraly, PT, DPT  Acute Rehabilitation [phone] pager [phone]) [phone] office     Kristin Grant 12/05/2018, 4:03 PM

## 2018-12-05 NOTE — Progress Notes (Addendum)
   Subjective: 1 Day Post-Op Procedure(s) (LRB): INTRAMEDULLARY (IM) NAIL INTERTROCHANTRIC (Left) Patient reports pain as mild.    Objective: Vital signs in last 24 hours: Temp:  [97.3 F (36.3 C)-98.4 F (36.9 C)] 97.8 F (36.6 C) (04/11 0435) Pulse Rate:  [71-100] 100 (04/11 0435) Resp:  [11-22] 18 (04/11 0435) BP: (86-115)/(39-71) 98/50 (04/11 0435) SpO2:  [93 %-100 %] 98 % (04/11 0435) Weight:  [62.4 kg] 62.4 kg (04/11 0717)  Intake/Output from previous day: 04/10 0701 - 04/11 0700 In: 1536.2 [I.V.:1286.2; IV Piggyback:250] Out: 500 [Urine:200; Blood:300] Intake/Output this shift: No intake/output data recorded.  Recent Labs    12/04/18 0039 12/05/18 0359  HGB 13.0 7.6*   Recent Labs    12/04/18 0039 12/05/18 0359  WBC 11.9* 14.9*  RBC 4.80 2.88*  HCT 41.6 24.4*  PLT 157 122*   Recent Labs    12/04/18 0039 12/05/18 0359  NA 141 141  K 3.6 4.1  CL 106 107  CO2 27 25  BUN 17 17  CREATININE 0.73 0.79  GLUCOSE 144* 174*  CALCIUM 8.2* 7.5*   Recent Labs    12/04/18 0800  INR 1.2    Neurologically intact Dg C-arm 1-60 Min  Result Date: 12/04/2018 CLINICAL DATA:  Intraoperative imaging for fixation of a left intertrochanteric fracture the patient suffered in a fall earlier today. Initial encounter. EXAM: DG C-ARM 61-120 MIN; OPERATIVE LEFT HIP WITH PELVIS COMPARISON:  Plain films left hip earlier today. FINDINGS: Six fluoroscopic intraoperative spot views are provided and demonstrate placement of a hip screw and short intramedullary nail with a single distal screw for fixation of an intertrochanteric fracture. No acute abnormality. IMPRESSION: Intraoperative imaging for fixation of a left intertrochanteric fracture. Electronically Signed   By: Drusilla Kanner M.D.   On: 12/04/2018 16:04   Dg Hip Operative Unilat W Or W/o Pelvis Left  Result Date: 12/04/2018 CLINICAL DATA:  Intraoperative imaging for fixation of a left intertrochanteric fracture the  patient suffered in a fall earlier today. Initial encounter. EXAM: DG C-ARM 61-120 MIN; OPERATIVE LEFT HIP WITH PELVIS COMPARISON:  Plain films left hip earlier today. FINDINGS: Six fluoroscopic intraoperative spot views are provided and demonstrate placement of a hip screw and short intramedullary nail with a single distal screw for fixation of an intertrochanteric fracture. No acute abnormality. IMPRESSION: Intraoperative imaging for fixation of a left intertrochanteric fracture. Electronically Signed   By: Drusilla Kanner M.D.   On: 12/04/2018 16:04    Assessment/Plan: 1 Day Post-Op Procedure(s) (LRB): INTRAMEDULLARY (IM) NAIL INTERTROCHANTRIC (Left) Up with therapy, SNF in a few days. She is looking for her glasses , likely was left at SNF Acute blood loss anemia secondary to hip fx , plavix and aspirin.  Eldred Manges 12/05/2018, 10:39 AM

## 2018-12-05 NOTE — Progress Notes (Signed)
Spoke with both Assisted living and PACU concerning glasses. Both say they don't have them.

## 2018-12-06 ENCOUNTER — Inpatient Hospital Stay (HOSPITAL_COMMUNITY): Payer: Medicare Other

## 2018-12-06 DIAGNOSIS — G9341 Metabolic encephalopathy: Secondary | ICD-10-CM

## 2018-12-06 LAB — BASIC METABOLIC PANEL
Anion gap: 9 (ref 5–15)
BUN: 29 mg/dL — ABNORMAL HIGH (ref 8–23)
CO2: 23 mmol/L (ref 22–32)
Calcium: 7.3 mg/dL — ABNORMAL LOW (ref 8.9–10.3)
Chloride: 109 mmol/L (ref 98–111)
Creatinine, Ser: 1.03 mg/dL — ABNORMAL HIGH (ref 0.44–1.00)
GFR calc Af Amer: 60 mL/min — ABNORMAL LOW (ref >60)
GFR calc non Af Amer: 52 mL/min — ABNORMAL LOW (ref >60)
Glucose, Bld: 194 mg/dL — ABNORMAL HIGH (ref 70–99)
Potassium: 3.8 mmol/L (ref 3.5–5.1)
Sodium: 141 mmol/L (ref 135–145)

## 2018-12-06 LAB — GLUCOSE, CAPILLARY
Glucose-Capillary: 152 mg/dL — ABNORMAL HIGH (ref 70–99)
Glucose-Capillary: 156 mg/dL — ABNORMAL HIGH (ref 70–99)
Glucose-Capillary: 142 mg/dL — ABNORMAL HIGH (ref 70–99)
Glucose-Capillary: 144 mg/dL — ABNORMAL HIGH (ref 70–99)

## 2018-12-06 LAB — URINALYSIS, ROUTINE W REFLEX MICROSCOPIC
Bacteria, UA: NONE SEEN
Bilirubin Urine: NEGATIVE
Glucose, UA: NEGATIVE mg/dL
Hgb urine dipstick: NEGATIVE
Ketones, ur: 5 mg/dL — AB
Nitrite: NEGATIVE
Protein, ur: NEGATIVE mg/dL
Specific Gravity, Urine: 1.02 (ref 1.005–1.030)
WBC, UA: >50 WBC/hpf — ABNORMAL HIGH (ref 0–5)
pH: 5 (ref 5.0–8.0)

## 2018-12-06 LAB — CBC
HCT: 23.5 % — ABNORMAL LOW (ref 36.0–46.0)
Hemoglobin: 7.5 g/dL — ABNORMAL LOW (ref 12.0–15.0)
MCH: 27.6 pg (ref 26.0–34.0)
MCHC: 31.9 g/dL (ref 30.0–36.0)
MCV: 86.4 fL (ref 80.0–100.0)
Platelets: 102 10*3/uL — ABNORMAL LOW (ref 150–400)
RBC: 2.72 MIL/uL — ABNORMAL LOW (ref 3.87–5.11)
RDW: 14.5 % (ref 11.5–15.5)
WBC: 8.9 10*3/uL (ref 4.0–10.5)
nRBC: 0 % (ref 0.0–0.2)

## 2018-12-06 MED ORDER — THIAMINE HCL 100 MG/ML IJ SOLN
100.0000 mg | Freq: Every day | INTRAMUSCULAR | Status: DC
  Administered 2018-12-06 – 2018-12-08 (×3): 100 mg via INTRAVENOUS
  Filled 2018-12-06 (×3): qty 2

## 2018-12-06 MED ORDER — TRAMADOL HCL 50 MG PO TABS
50.0000 mg | ORAL_TABLET | Freq: Four times a day (QID) | ORAL | Status: DC | PRN
  Administered 2018-12-06 – 2018-12-07 (×3): 50 mg via ORAL
  Filled 2018-12-06 (×3): qty 1

## 2018-12-06 MED ORDER — KETOROLAC TROMETHAMINE 30 MG/ML IJ SOLN
15.0000 mg | Freq: Once | INTRAMUSCULAR | Status: AC
  Administered 2018-12-06: 15 mg via INTRAVENOUS
  Filled 2018-12-06: qty 1

## 2018-12-06 MED ORDER — DEXTROSE IN LACTATED RINGERS 5 % IV SOLN
INTRAVENOUS | Status: DC
  Administered 2018-12-06 – 2018-12-07 (×2): via INTRAVENOUS

## 2018-12-06 MED ORDER — LACTATED RINGERS IV SOLN
INTRAVENOUS | Status: DC
  Administered 2018-12-06: 10:00:00 via INTRAVENOUS

## 2018-12-06 MED ORDER — NALOXONE HCL 0.4 MG/ML IJ SOLN
INTRAMUSCULAR | Status: AC
  Administered 2018-12-06: 0.4 mg
  Filled 2018-12-06: qty 1

## 2018-12-06 MED ORDER — NALOXONE HCL 0.4 MG/ML IJ SOLN: 0.4000 mg | INTRAMUSCULAR | Status: DC | PRN

## 2018-12-06 NOTE — Progress Notes (Signed)
**Note De-Identified vi Obfusction** PROGRESS NOTE    Kristin Grant  ZOX:[id number] DOB: 03-29-1941 DOA: 12/04/2018 PCP: Slley Scrlet, MD  Brief Nrrtive:  Per HPI  Arf Weissmn  is  78 y.o. femle, with hypertension, dm2, hypothyroidism, h/o CVA, dementi, pprently fell t ssisted living.  Its uncler how she fell. Pt sttes tht she fell while getting out of bed to go to the bthroom, but she hs dementi.   Pt denies ny cp, plp, sob, n/v, dirrhe, brbpr, blck stool, dysuri, but once gin history is limited by her dementi,  She is xox1 (person), not plce or time.    Pt ws pprently  full code while t SNF. I ttempted to contct her son, but got voicemil. Left messge tht she will be dmitted to [facility].    In ED,  T 98.8, febrile,  Pt 63, Bp 119/59, pox 100% on RA  Xry:  IMPRESSION: 1. Acute comminuted intertrochnteric frcture of the left hip. 2. Corticl irregulrity through the left superior nd inferior pubic rmi, suspicious for ssocited pelvic frctures.   Wbc 11.9, hgb 13.0, Plt 157 N 141, K 3.6 Bun 17, Cretinine 0.73 Glucose 144  ED spoke with orthopedics nd they requested dmission to [facility] for L hip intertrochnteric frcture with possible left superior nd inferior pubic rmi frcture.   Assessment & Pln:   Principl Problem:   Intertrochnteric frcture of left hip (HCC) Active Problems:   Dementi (HCC)   Hypertension   Hypothyroidism   Dibetes mellitus, type II (HCC)   History of CVA (cerebrovsculr ccident)   Intertrochnteric frcture of left hip, closed, initil encounter (HCC)  Acute Encephlopthy: Suspect this is toxic/metbilic, possibly relted to opites vs hospitliztion, but chnge in ptient sttus ws brupt nd did not significntly improve fter nrcn.  No focl deficits pprecited on my exm, but pt encephlopthic nd hving some difficulty with speech.  Given her brupt chnge in mentl sttus, will cll code stroke.   Left intertrochnteric hip frcture  Possible Left Superior nd Inferior Pubic Rmi Fx Apprecite ortho ssistnce  S/p L IM nil on 4/10 PT/OT tody ASA 325 mg dily for DVT ppx APAP, celebrex, trmdol, morphine for brekthrough  Post Opertive Blood Loss Anemi: Stble H/H tody.  Pt ws on DAPT t time of surgery.  Follow PM H/H.  Trnsfuse for <7 or symptomtic. S/p 1 unit pRBC 4/11  Dm2 Hold Trdjent, Hold Novlog, Hold Lntus since NPO ACHS SSI   Hypertension Cont Proprnolol 40mg  po bid Lisinopril on hold, pt with soft BP  H/o CVA Cont spirin, Cont plvix  Hypothyroidism Cont Levothyroxine 50 microgrms po qdy  Dementi Cont Remeron 30mg  po qhs  Gerd Cont Protonix 40mg  po qdy  Thrombocytopeni:  Follow   DVT prophylxis: SCD Code Sttus: full  Fmily Communiction: discussed with dughter Disposition Pln: pending OR   Consultnts:   orthopedics  Procedures:   none  Antimicrobils:  Anti-infectives (From dmission, onwrd)   Strt     Dose/Rte Route Frequency Ordered Stop   12/05/18 0600  ceFAZolin (ANCEF) IVPB 2g/100 mL premix     2 g 200 mL/hr over 30 Minutes Intrvenous On cll to O.R. 12/04/18 1300 12/04/18 1430     Subjective: Pt ltered Knows she's in hospitl Doesn't know month  Objective: Vitls:   12/05/18 2046 12/06/18 0128 12/06/18 0500 12/06/18 0822  BP: (!) 99/58 (!) 107/42  124/67  Pulse: (!) 109 91  (!) 106  Resp: 20 16    Temp: 98.9 F (37.2 **Note De-Identified vi Obfusction** C) 97.7 F (36.5 C)  98.5 F (36.9 C)  TempSrc: Orl Orl  Orl  SpO2: 100% 99%  99%  Weight:   64.4 kg   Height:        Intke/Output Summry (Lst 24 hours) t 12/06/2018 0857 Lst dt filed t 12/05/2018 2105 Gross per 24 hour  Intke 0 ml  Output 100 ml  Net -100 ml   Filed Weights   12/04/18 0444 12/05/18 0717 12/06/18 0500  Weight: 57.5 kg 62.4 kg 64.4 kg    Exmintion:  Generl: No cute distress. Crdiovsculr: Hert sounds show  regulr rte,  nd rhythm. Lungs: Cler to usculttion bilterlly  bdomen: Soft, nontender, nondistended Neurologicl: lert nd oriented 1. 5/5 strength to upper nd RLE.  Unble to test LLE due to recent surgery. Crnil nerves II through XII grossly intct.  Pt somewht lethrgic, slow to respond, difficulty communicting. Skin: Wrm nd dry. No rshes or lesions. Extremities: No clubbing or cynosis. No edem.     Dt Reviewed: I hve personlly reviewed following lbs nd imging studies  CBC: Recent Lbs  Lb 12/04/18 0039 12/05/18 0359 12/05/18 1433 12/05/18 2255 12/06/18 0245  WBC 11.9* 14.9*  --   --  8.9  NEUTROBS 8.5*  --   --   --   --   HGB 13.0 7.6* 6.8* 7.4* 7.5*  HCT 41.6 24.4* 21.7* 22.9* 23.5*  MCV 86.7 84.7  --   --  86.4  PLT 157 122*  --   --  102*   Bsic Metbolic Pnel: Recent Lbs  Lb 12/04/18 0039 12/05/18 0359 12/06/18 0245  N 141 141 141  K 3.6 4.1 3.8  CL 106 107 109  CO2 <BDTEXTTG> GLUCOSE 144* 174* 194*  BUN 17 17 29*  CRETININE 0.73 0.79 1.03*  CLCIUM 8.2* 7.5* 7.3*   GFR: Estimted Cretinine Clernce: 38.2 mL/min () (by [place] formul bsed on SCr of 1.03 mg/dL (H)). Liver Function Tests: Recent Lbs  Lb 12/04/18 0039 12/05/18 0359  ST 19 20  LT 20 15  LKPHOS 68 43  BILITOT 0.4 0.5  PROT 6.2* 4.4*  LBUMIN 3.6 2.6*   No results for input(s): LIPSE, MYLSE in the lst 168 hours. No results for input(s): MMONI in the lst 168 hours. Cogultion Profile: Recent Lbs  Lb 12/04/18 0800  INR 1.2   Crdic Enzymes: Recent Lbs  Lb 12/04/18 0039  CKTOTL 46   BNP (lst 3 results) No results for input(s): PROBNP in the lst 8760 hours. Hb1C: Recent Lbs    12/04/18 0800  HGB1C 6.3*   CBG: Recent Lbs  Lb 12/05/18 1017 12/05/18 1331 12/05/18 1626 12/05/18 2041 12/06/18 0805  GLUCP 153* 144* 147* 197* 152*   Lipid Profile: No results for input(s): CHOL, HDL, LDLCLC, TRIG, CHOLHDL, LDLDIRECT in the  lst 72 hours. Thyroid Function Tests: No results for input(s): TSH, T4TOTL, FREET4, T3FREE, THYROIDB in the lst 72 hours. nemi Pnel: No results for input(s): VITMINB12, FOLTE, FERRITIN, TIBC, IRON, RETICCTPCT in the lst 72 hours. Sepsis Lbs: No results for input(s): PROCLCITON, LTICCIDVEN in the lst 168 hours.  Recent Results (from the pst 240 hour(s))  Surgicl pcr screen     Sttus: None   Collection Time: 12/04/18  4:52 M  Result Vlue Ref Rnge Sttus   MRS, PCR NEGTIVE NEGTIVE Finl   Stphylococcus ureus NEGTIVE NEGTIVE Finl    Comment: (NOTE) The Xpert S ssy (FD pproved for NSL specimens in ptients 22 yers of **Note De-Identified vi Obfusction** ge nd older), is one component of  comprehensive surveillnce progrm. It is not intended to dignose infection nor to guide or monitor tretment. Performed t [facility], [address], [place], [place] [address]   Culture, blood (routine x 2)     Sttus: None (Preliminry result)   Collection Time: 12/05/18 10:11 PM  Result Vlue Ref Rnge Sttus   Specimen Description BLOOD RIGHT HAND  Finl   Specil Requests   Finl    BOTTLES DRAWN AEROBIC ONLY Blood Culture dequte volume   Culture   Finl    NO GROWTH < 12 HOURS Performed t [facility], [address] [place], [place] [address]    Report Sttus PENDING  Incomplete  Culture, blood (routine x 2)     Sttus: None (Preliminry result)   Collection Time: 12/05/18 11:11 PM  Result Vlue Ref Rnge Sttus   Specimen Description BLOOD RIGHT HAND  Finl   Specil Requests   Finl    BOTTLES DRAWN AEROBIC ONLY Blood Culture results my not be optiml due to n indequte volume of blood received in culture bottles   Culture   Finl    NO GROWTH < 12 HOURS Performed t [facility], [address], [place], [place] [address]    Report Sttus PENDING  Incomplete         Rdiology Studies: Dg Chest Port 1 View  Result Dte: 12/05/2018 CLINICAL  DATA:  Fever. EXAM: PORTABLE CHEST 1 VIEW COMPARISON:  Rdiogrph of December 04, 2018. FINDINGS: The hert size nd medistinl contours re within norml limits. No pneumothorx or pleurl effusion is noted. Right lung is cler. Elevted left hemidiphrgm is noted with miniml left bsilr subsegmentl telectsis. The visulized skeletl structures re unremrkble. IMPRESSION: Miniml left bsilr subsegmentl telectsis. Electroniclly Signed   By: Lupit Rider, M.D.   On: 12/05/2018 21:19   Dg C-rm 1-60 Min  Result Dte: 12/04/2018 CLINICAL DATA:  Intropertive imging for fixtion of  left intertrochnteric frcture the ptient suffered in  fll erlier tody. Initil encounter. EXAM: DG C-ARM 61-120 MIN; OPERATIVE LEFT HIP WITH PELVIS COMPARISON:  Plin films left hip erlier tody. FINDINGS: Six fluoroscopic intropertive spot views re provided nd demonstrte plcement of  hip screw nd short intrmedullry nil with  single distl screw for fixtion of n intertrochnteric frcture. No cute bnormlity. IMPRESSION: Intropertive imging for fixtion of  left intertrochnteric frcture. Electroniclly Signed   By: Drusill Knner M.D.   On: 12/04/2018 16:04   Dg Hip Opertive Unilt W Or W/o Pelvis Left  Result Dte: 12/04/2018 CLINICAL DATA:  Intropertive imging for fixtion of  left intertrochnteric frcture the ptient suffered in  fll erlier tody. Initil encounter. EXAM: DG C-ARM 61-120 MIN; OPERATIVE LEFT HIP WITH PELVIS COMPARISON:  Plin films left hip erlier tody. FINDINGS: Six fluoroscopic intropertive spot views re provided nd demonstrte plcement of  hip screw nd short intrmedullry nil with  single distl screw for fixtion of n intertrochnteric frcture. No cute bnormlity. IMPRESSION: Intropertive imging for fixtion of  left intertrochnteric frcture. Electroniclly Signed   By: Drusill Knner M.D.   On: 12/04/2018 16:04         Scheduled Meds: . sodium chloride   Intrvenous Once  . cetminophen  650 mg Orl Q6H  . spirin EC  325 mg Orl Q brekfst  . celecoxib  200 mg Orl BID  . clopidogrel  75 mg Orl Dily  . docuste sodium 100 mg Oral BID  . feeding supplement (ENSURE ENLIVE)  237 mL Oral BID BM  . ferrous sulfate  325 mg Oral Q breakfast  . insulin aspart  0-5 Units Subcutaneous QHS  . insulin aspart  0-9 Units Subcutaneous TID WC  . levothyroxine  50 mcg Oral QAC breakfast  . linagliptin  5 mg Oral Daily  . mirtazapine  30 mg Oral QHS  . multivitamin with minerals  1 tablet Oral Daily  . pantoprazole  40 mg Oral Daily  . polyethylene glycol  17 g Oral Daily  . propranolol  40 mg Oral BID   Continuous Infusions:    LOS: 2 days    Time spent: over 30 min    Lacretia Nicksaldwell Powell, MD [facility] Pager AMION  If 7PM-7AM, please contact night-coverage [email or web address] Password [email or web address] 12/06/2018, 8:57 AM

## 2018-12-06 NOTE — Consult Note (Addendum)
NEURO HOSPITALIST  CONSULT   Requesting Physician: Dr. Lowell Guitar    Chief Complaint: AMS  History obtained from:  Patient  Milinda Pointer   HPI:                                                                                                                                         Lan Entsminger is an 78 y.o. female with PMH HTN, DM, CVA ( 10/2017) , alzheimer dementia, hypothyroidism was admitted on 12/04/2018 after a fall at [facility]. Code stroke called on 12/06/2018 for acute mental status change.  Patient received pain medication due to agitation.  Shortly after that patient no longer talking or yelling, and nurse noticed patient was very difficult to arouse.  Symptoms did not improve despite administration of Narcan and patient was stroke alerted. Hospital course:  4/10: admitted;left IM nail 4/12: code stroke called for acute mental status change, BP: 124/67, BG; 152 CTH: no hemorrhage  Past stroke history 11/15/2017: suspected stroke; treated with IV TPA. Stroke not seen on MRI.   Date last known well: 12/06/2018 Time last known well: 0753 tPA Given: No: recent surgery  Modified Rankin: Rankin Score=1 NIHSS:1; speech   Past Medical History:  Diagnosis Date  . Alzheimer disease (HCC)   . Arthritis   . Coronary artery disease   . Diabetes mellitus   . Difficulty walking   . Hypertension   . Reflux   . Stroke [facility])     Past Surgical History:  Procedure Laterality Date  . APPENDECTOMY    . FINGER DEBRIDEMENT    . INTRAMEDULLARY (IM) NAIL INTERTROCHANTERIC Right 08/03/2018   Procedure: INTRAMEDULLARY (IM) NAIL INTERTROCHANTRIC;  Surgeon: Eldred Manges, MD;  Location: [facility] OR;  Service: Orthopedics;  Laterality: Right;    Family History  Problem Relation Age of Onset  . Hypertension Mother   . Heart disease Mother   . Hypertension Father   . Heart disease Father   . Hypertension Sister   . Heart disease Sister    . Cancer Sister        BREAST   . Hypertension Brother   . Heart disease Brother          Social History:  reports that she has never smoked. She has never used smokeless tobacco. She reports that she does not drink alcohol or use drugs.  Allergies: No Known Allergies  Medications:  Scheduled: . sodium chloride   Intravenous Once  . acetaminophen  650 mg Oral Q6H  . aspirin EC  325 mg Oral Q breakfast  . celecoxib  200 mg Oral BID  . clopidogrel  75 mg Oral Daily  . docusate sodium  100 mg Oral BID  . feeding supplement (ENSURE ENLIVE)  237 mL Oral BID BM  . ferrous sulfate  325 mg Oral Q breakfast  . insulin aspart  0-5 Units Subcutaneous QHS  . insulin aspart  0-9 Units Subcutaneous TID WC  . levothyroxine  50 mcg Oral QAC breakfast  . linagliptin  5 mg Oral Daily  . mirtazapine  30 mg Oral QHS  . multivitamin with minerals  1 tablet Oral Daily  . pantoprazole  40 mg Oral Daily  . polyethylene glycol  17 g Oral Daily  . propranolol  40 mg Oral BID   Continuous:  ZOX:[id number], menthol-cetylpyridinium **OR** phenol, metoCLOPramide **OR** metoCLOPramide (REGLAN) injection, naLOXone (NARCAN)  injection, ondansetron **OR** ondansetron (ZOFRAN) IV, traMADol   ROS:                                                                                                                                       ROS was performed and is negative except as noted in HPI    General Examination:                                                                                                      Blood pressure 124/67, pulse (!) 106, temperature 98.5 F (36.9 C), temperature source Oral, resp. rate 16, height  (1.626 m), weight 64.4 kg, SpO2 99 %.  HEENT-  Normocephalic, no lesions, without obvious abnormality.  Normal external eye and conjunctiva. Cardiovascular- S1-S2  audible, pulses palpable throughout  Lungs-no rhonchi or wheezing noted, no excessive working breathing.  Saturations within normal limits Extremities- Warm, dry and intact Musculoskeletal-no joint tenderness, deformity or swelling Skin-warm and dry, no hyperpigmentation, vitiligo, or suspicious lesions  Neurological Examination Mental Status: Alert, oriented, thought content appropriate.  Speech fluent without evidence of aphasia.  Slight dysarthria. Able to follow  commands without difficulty. Cranial Nerves: WJ:XBJYNW fields grossly normal,  III,IV, VI: ptosis not present, extra-ocular motions intact bilaterally, disconjugate gaze noted, right eye with amblyopia (per family this is baseline) pupils equal, round, reactive to light and accommodation V,VII: smile symmetric, facial light touch sensation normal bilaterally VIII: hearing normal bilaterally IX,X: uvula rises midline XI: bilateral shoulder shrug XII: midline  tongue extension Motor: Right : Upper extremity   5/5  Left:     Upper extremity   5/5  Lower extremity   5/5   Lower extremity   UTA d/t recent hip surgery Tone and bulk:normal tone throughout; no atrophy noted Sensory:  light touch intact throughout, bilaterally Cerebellar: normal finger-to-nose, HTS not assessed d/t recent hip surgery Gait: deferred   Lab Results: Basic Metabolic Panel: Recent Labs  Lab 12/04/18 0039 12/05/18 0359 12/06/18 0245  NA 141 141 141  K 3.6 4.1 3.8  CL 106 107 109  CO2 27 25 23   GLUCOSE 144* 174* 194*  BUN 17 17 29*  CREATININE 0.73 0.79 1.03*  CALCIUM 8.2* 7.5* 7.3*    CBC: Recent Labs  Lab 12/04/18 0039 12/05/18 0359 12/05/18 1433 12/05/18 2255 12/06/18 0245  WBC 11.9* 14.9*  --   --  8.9  NEUTROABS 8.5*  --   --   --   --   HGB 13.0 7.6* 6.8* 7.4* 7.5*  HCT 41.6 24.4* 21.7* 22.9* 23.5*  MCV 86.7 84.7  --   --  86.4  PLT 157 122*  --   --  102*   CBG: Recent Labs  Lab 12/05/18 1017 12/05/18 1331  12/05/18 1626 12/05/18 2041 12/06/18 0805  GLUCAP 153* 144* 147* 197* 152*    Imaging: Dg Chest Port 1 View  Result Date: 12/05/2018 CLINICAL DATA:  Fever. EXAM: PORTABLE CHEST 1 VIEW COMPARISON:  Radiograph of December 04, 2018. FINDINGS: The heart size and mediastinal contours are within normal limits. No pneumothorax or pleural effusion is noted. Right lung is clear. Elevated left hemidiaphragm is noted with minimal left basilar subsegmental atelectasis. The visualized skeletal structures are unremarkable. IMPRESSION: Minimal left basilar subsegmental atelectasis. Electronically Signed   By: Lupita RaiderJames  Green Jr, M.D.   On: 12/05/2018 21:19   Dg C-arm 1-60 Min  Result Date: 12/04/2018 CLINICAL DATA:  Intraoperative imaging for fixation of a left intertrochanteric fracture the patient suffered in a fall earlier today. Initial encounter. EXAM: DG C-ARM 61-120 MIN; OPERATIVE LEFT HIP WITH PELVIS COMPARISON:  Plain films left hip earlier today. FINDINGS: Six fluoroscopic intraoperative spot views are provided and demonstrate placement of a hip screw and short intramedullary nail with a single distal screw for fixation of an intertrochanteric fracture. No acute abnormality. IMPRESSION: Intraoperative imaging for fixation of a left intertrochanteric fracture. Electronically Signed   By: Drusilla Kannerhomas  Dalessio M.D.   On: 12/04/2018 16:04   Dg Hip Operative Unilat W Or W/o Pelvis Left  Result Date: 12/04/2018 CLINICAL DATA:  Intraoperative imaging for fixation of a left intertrochanteric fracture the patient suffered in a fall earlier today. Initial encounter. EXAM: DG C-ARM 61-120 MIN; OPERATIVE LEFT HIP WITH PELVIS COMPARISON:  Plain films left hip earlier today. FINDINGS: Six fluoroscopic intraoperative spot views are provided and demonstrate placement of a hip screw and short intramedullary nail with a single distal screw for fixation of an intertrochanteric fracture. No acute abnormality. IMPRESSION:  Intraoperative imaging for fixation of a left intertrochanteric fracture. Electronically Signed   By: Drusilla Kannerhomas  Dalessio M.D.   On: 12/04/2018 16:04       Valentina LucksJessica Williams, MSN, NP-C [facility] [phone]  12/06/2018, 9:10 AM   Attending physician note to follow with Assessment and plan .   Assessment: 78 y.o. female with PMH HTN, DM, CVA ( 10/2017) , alzheimer dementia, hypothyroidism was admitted on 12/04/2018 after a fall at [facility]. Code stroke called on 12/06/2018  for AMS. CTH:  Not a candidate for TPA d/t recent hip surgery.  Given her presentation and the fact that her mental status improved before stroke team arrived, this is less likely a stroke. Medication induced mental status change. No hemorrhage or abnormality seen on CT scan.  Stroke Risk Factors - diabetes mellitus, hyperlipidemia and hypertension    Acute toxic metabolic encephalopathy and administration of pain medication  Recommendations: -- stat head CT: No acute findings -- limit opioids and sedating medications --Cancel code stroke, no need to perform MRI brain if patient returns back to baseline   NEUROHOSPITALIST ADDENDUM Performed a face to face diagnostic evaluation at time of code stroke.   I have reviewed the contents of history and physical exam as documented by PA/ARNP/Resident and agree with above documentation.  I have discussed and formulated the above plan as documented. Edits to the note have been made as needed.  Impression: Acute metabolic encephalopathy after administration of pain medication. No focal motor weakness, aphasia.  Patient has slightly hoarse voice but following all commands appropriately.  CT head was obtained, no hemorrhages seen or any other acute abnormality.  TPA was not administered as patient had recent surgery, and very low suspicion this is a stroke.  Recommend limiting pain medication/narcotics.     Georgiana Spinner  MD [facility] [phone]   If 7pm to 7am, please call on call as listed on AMION.

## 2018-12-06 NOTE — Significant Event (Addendum)
Rapid Response Event Note  Overview: Neurologic - AMS  Initial Focused Assessment: Called by nurse to assess patient for acute mental status change. Per nurse, she assessed the patient was 0753, pain medication was administered. Per nurse, at that time was patient in pain and agitated, nurse administered Oxycodone 2.5 mg IR PO and then went to assess another nurse. The nurse noticed the patient was no longer yelling or talking, she went in the room and noticed that the patient was hard to arouse and seemed different. When I arrived, patient was drowsy, quickly awoke to my voice, follows commands in all extremities, no motor or sensory loss, no facial weakness. Patient is confused and has been (hx: dementia), voice was hoarse and has been per staff, no vision loss noted either. VSS. TRH MD was already paged by nurse. NIH - 2 ( LOC Consciousness 1 and Orientation 1). Blood sugar was normal.  TRH MD arrived, decision made to give Narcan. 1st dose Narcan 0.2 mg IV x 1 - patient was more awake, still following commands, now endorsing pain in her hip/leg on LT but not back to baseline so 2nd dose Narcan 0.2 mg IV x 1 given and patient was more alert, grimacing to pain. Decision made to call Code Stroke per [facility] MD, Code Stroke called at 0855. Neuro MD/APP came to bedside, assessed patient, and patient taken for STAT CT HEAD. Taken to CT and then brought back to room. Upon arrival to room, patient quickly fell asleep but wakes to voice easily.   Interventions: -- Narcan 0.4 mg IV total  -- STAT HEAD CT - no acute findings   Plan of Care: -- MD changed pain medication regimen -- Delirium Precautions -- Start MIVF  LR @ 75 cc/hr. Last documented UOP was 200 cc at 2100 last night. Urine in canister (< 30cc) - appears concentrated.  -- I paged TRH MD [phone] - MD updated -- I paged Neuro APP at [phone] - to inform CT was done and to ask if there were any neuro recommendations. Will cancel code stroke, if patient  does not improve, TRH MD can call Neuro MD.   Event Summary:    at    Call Time 0818 Arrival Time 0820 Code Stroke Called 0855 End Time 0950  Kristin Grant

## 2018-12-06 NOTE — Evaluation (Signed)
Physical Therapy Evaluation Patient Details Name: Kristin Grant MRN: [id number] DOB: [birth year] Today's Date: 12/06/2018   History of Present Illness  Pt is a 78 y.o. F with significant PMH of hypertension, CVA, dementia who presents with fall from assisted living. Found to have acute comminuted intertrochanteric fracture of the left hip. Possible left superior and inferior pubic rami fx. Now s/p left intertrochanteric IM nail. Code stroke called on 4/12 with AMS but CT scan negative for acute abnormality.   Clinical Impression  Pt admitted with above diagnosis. Pt currently with functional limitations due to the deficits listed below (see PT Problem List). Pt agitated upon entrance, perseverating that PT was going to "throw her out the window." Attempted to calm pt with music, but did not help. Pt requiring two person maximal assistance for bed mobility. Unable to stand to walker and maintain weightbearing status. Presents with weakness, LLE pain, decreased range of motion, and baseline cognitive deficits which impair functional mobility. Pt will benefit from skilled PT to increase their independence and safety with mobility to allow discharge to the venue listed below.       Follow Up Recommendations SNF    Equipment Recommendations  None recommended by PT    Recommendations for Other Services       Precautions / Restrictions Precautions Precautions: Fall Restrictions Weight Bearing Restrictions: Yes LLE Weight Bearing: Partial weight bearing LLE Partial Weight Bearing Percentage or Pounds: 50%      Mobility  Bed Mobility Overal bed mobility: Needs Assistance Bed Mobility: Supine to Sit;Sit to Supine     Supine to sit: Max assist;+2 for physical assistance Sit to supine: Max assist;+2 for physical assistance   General bed mobility comments: MaxA + 2 for all aspects of bed mobility due to decreased pt initiation. Pt able to move her BLE's over to edge of bed  minimally  Transfers Overall transfer level: Needs assistance Equipment used: Rolling walker (2 wheeled) Transfers: Sit to/from Stand Sit to Stand: Max assist;+2 physical assistance         General transfer comment: MaxA + 2 for partial stand. PT placed foot under pt left foot and pt unable to maintain PWB so sat back down towards head of bed  Ambulation/Gait                Stairs            Wheelchair Mobility    Modified Rankin (Stroke Patients Only)       Balance Overall balance assessment: Needs assistance Sitting-balance support: Feet supported Sitting balance-Leahy Scale: Fair     Standing balance support: Bilateral upper extremity supported Standing balance-Leahy Scale: Zero                               Pertinent Vitals/Pain Pain Assessment: Faces Faces Pain Scale: Hurts even more Pain Location: LLE with movement Pain Descriptors / Indicators: Grimacing;Operative site guarding Pain Intervention(s): Limited activity within patient's tolerance;Monitored during session;Repositioned    Home Living Family/patient expects to be discharged to:: Skilled nursing facility                 Additional Comments: [facility]    Prior Function Level of Independence: Needs assistance   Gait / Transfers Assistance Needed: uses RW  ADL's / Homemaking Assistance Needed: likely needs assist        Hand Dominance   Dominant Hand: Right    Extremity/Trunk Assessment  Upper Extremity Assessment Upper Extremity Assessment: Generalized weakness    Lower Extremity Assessment Lower Extremity Assessment: Generalized weakness;LLE deficits/detail LLE Deficits / Details: s/p IM nail    Cervical / Trunk Assessment Cervical / Trunk Assessment: Kyphotic  Communication   Communication: Other (comment)(hoarse vocal quality)  Cognition Arousal/Alertness: Awake/alert Behavior During Therapy: Anxious Overall Cognitive Status: History of  cognitive impairments - at baseline                                        General Comments      Exercises     Assessment/Plan    PT Assessment Patient needs continued PT services  PT Problem List Decreased strength;Decreased range of motion;Decreased activity tolerance;Decreased balance;Decreased mobility;Decreased cognition;Decreased knowledge of precautions;Pain       PT Treatment Interventions DME instruction;Therapeutic activities;Functional mobility training;Therapeutic exercise;Balance training;Patient/family education;Wheelchair mobility training    PT Goals (Current goals can be found in the Care Plan section)  Acute Rehab PT Goals Patient Stated Goal: none stated PT Goal Formulation: Patient unable to participate in goal setting Time For Goal Achievement: 12/20/18 Potential to Achieve Goals: Fair    Frequency Min 3X/week   Barriers to discharge        Co-evaluation               AM-PAC PT "6 Clicks" Mobility  Outcome Measure Help needed turning from your back to your side while in a flat bed without using bedrails?: A Lot Help needed moving from lying on your back to sitting on the side of a flat bed without using bedrails?: Total Help needed moving to and from a bed to a chair (including a wheelchair)?: Total Help needed standing up from a chair using your arms (e.g., wheelchair or bedside chair)?: Total Help needed to walk in hospital room?: Total Help needed climbing 3-5 steps with a railing? : Total 6 Click Score: 7    End of Session Equipment Utilized During Treatment: Gait belt Activity Tolerance: Treatment limited secondary to agitation Patient left: in bed;with call bell/phone within reach;with bed alarm set Nurse Communication: Mobility status PT Visit Diagnosis: Other abnormalities of gait and mobility (R26.89);Pain Pain - Right/Left: Left Pain - part of body: Leg    Time: 1132-1200 PT Time Calculation (min) (ACUTE  ONLY): 28 min   Charges:   PT Evaluation $PT Eval Moderate Complexity: 1 Mod PT Treatments $Therapeutic Activity: 8-22 mins       Laurina Bustlearoline Prudencio Velazco, PT, DPT Acute Rehabilitation Services Pager [phone] Office [phone]  Kristin MuldersCarloine H Capri Grant 12/06/2018, 4:10 PM

## 2018-12-06 NOTE — Evaluation (Signed)
Clinical/Bedside Swallow Evaluation Patient Details  Name: Kristin Grant MRN: [id number] Date of Birth: 07-Jul-1941  Today's Date: 12/06/2018 Time: SLP Start Time (ACUTE ONLY): 1651 SLP Stop Time (ACUTE ONLY): 1713 SLP Time Calculation (min) (ACUTE ONLY): 22 min  Past Medical History:  Past Medical History:  Diagnosis Date  . Alzheimer disease (HCC)   . Arthritis   . Coronary artery disease   . Diabetes mellitus   . Difficulty walking   . Hypertension   . Reflux   . Stroke [facility])    Past Surgical History:  Past Surgical History:  Procedure Laterality Date  . APPENDECTOMY    . FINGER DEBRIDEMENT    . INTRAMEDULLARY (IM) NAIL INTERTROCHANTERIC Right 08/03/2018   Procedure: INTRAMEDULLARY (IM) NAIL INTERTROCHANTRIC;  Surgeon: Eldred Manges, MD;  Location: [facility] OR;  Service: Orthopedics;  Laterality: Right;   HPI:  Pt is a 78 y.o. F with significant PMH of hypertension, CVA, dementia who presents with fall from assisted living. Found to have acute comminuted intertrochanteric fracture of the left hip. Possible left superior and inferior pubic rami fx. Now s/p left intertrochanteric IM nail. Code stroke called on 4/12 with AMS but CT scan negative for acute abnormality. Per neuro, presentation less likely stroke vs medication induced mental status change.   Assessment / Plan / Recommendation Clinical Impression   Patient presents with increased risk for aspiration in the setting of altered mentation. Pt awake but disoriented, not following commands consistently and accepting minimal POs at this time. No focal cranial nerve deficits apparent. Pt with poor awareness of boluses, even with usual max cues from SLP. Per RN has had difficulty swallowing meds in puree. At this time, recommend NPO; allow ice chips after oral care if pt alert and cooperative. SLP to follow up next date, prognosis to resume diet good with improvements in mentation.     SLP Visit Diagnosis: Dysphagia, unspecified  (R13.10)    Aspiration Risk  Moderate aspiration risk    Diet Recommendation NPO;Ice chips PRN after oral care   Medication Administration: Via alternative means    Other  Recommendations Oral Care Recommendations: Oral care QID;Oral care prior to ice chip/H20 Other Recommendations: Prohibited food (jello, ice cream, thin soups);Remove water pitcher;Have oral suction available   Follow up Recommendations Skilled Nursing facility      Frequency and Duration min 2x/week  2 weeks       Prognosis Prognosis for Safe Diet Advancement: Good Barriers to Reach Goals: Cognitive deficits      Swallow Study   General Date of Onset: 12/04/18 HPI: Pt is a 78 y.o. F with significant PMH of hypertension, CVA, dementia who presents with fall from assisted living. Found to have acute comminuted intertrochanteric fracture of the left hip. Possible left superior and inferior pubic rami fx. Now s/p left intertrochanteric IM nail. Code stroke called on 4/12 with AMS but CT scan negative for acute abnormality. Per neuro, presentation less likely stroke vs medication induced mental status change. Type of Study: Bedside Swallow Evaluation Previous Swallow Assessment: clinical swallow assessment 10/2017 WFL, reg/thin meds whole in puree Diet Prior to this Study: Regular;Thin liquids Temperature Spikes Noted: Yes(100.1 12/05/18) Respiratory Status: Nasal cannula History of Recent Intubation: Yes Length of Intubations (days): (for procedure) Date extubated: 12/04/18 Behavior/Cognition: Alert;Confused;Distractible;Doesn't follow directions Oral Cavity Assessment: Dry Oral Cavity - Dentition: Dentures, not available Self-Feeding Abilities: Total assist Patient Positioning: Upright in bed Baseline Vocal Quality: Low vocal intensity;Hoarse Volitional Cough: Cognitively unable to elicit Volitional Swallow:  Unable to elicit    Oral/Motor/Sensory Function Overall Oral Motor/Sensory Function: Within  functional limits   Ice Chips Ice chips: Impaired Presentation: Spoon Oral Phase Impairments: Poor awareness of bolus;Reduced lingual movement/coordination Oral Phase Functional Implications: Oral holding;Prolonged oral transit   Thin Liquid Thin Liquid: Impaired Oral Phase Impairments: Poor awareness of bolus;Reduced labial seal Pharyngeal  Phase Impairments: Throat Clearing - Immediate    Nectar Thick Nectar Thick Liquid: Not tested   Honey Thick Honey Thick Liquid: Not tested   Puree Puree: Impaired Oral Phase Impairments: Poor awareness of bolus Oral Phase Functional Implications: Prolonged oral transit Pharyngeal Phase Impairments: Other (comments)(RN reports difficulty with meds in puree)   Solid     Solid: Not tested     Rondel BatonMary Beth Jeraldine Primeau, MS, CCC-SLP Speech-Language Pathologist Acute Rehabilitation Services Pager: [phone] Office: [phone]  Kristin LindauMary E Knolan Grant 12/06/2018,5:25 PM

## 2018-12-06 NOTE — Progress Notes (Signed)
Subjective: 2 Days Post-Op Procedure(s) (LRB): INTRAMEDULLARY (IM) NAIL INTERTROCHANTRIC (Left) Patient reports pain as mild and moderate.    Objective: Vital signs in last 24 hours: Temp:  [97.7 F (36.5 C)-100.2 F (37.9 C)] 98.1 F (36.7 C) (04/12 0943) Pulse Rate:  [91-116] 103 (04/12 0943) Resp:  [16-20] 16 (04/12 0128) BP: (91-124)/(42-85) 124/49 (04/12 0943) SpO2:  [89 %-100 %] 100 % (04/12 0943) Weight:  [64.4 kg] 64.4 kg (04/12 0500)  Intake/Output from previous day: 04/11 0701 - 04/12 0700 In: 0  Out: 100 [Urine:100] Intake/Output this shift: No intake/output data recorded.  Recent Labs    12/04/18 0039 12/05/18 0359 12/05/18 1433 12/05/18 2255 12/06/18 0245  HGB 13.0 7.6* 6.8* 7.4* 7.5*   Recent Labs    12/05/18 0359  12/05/18 2255 12/06/18 0245  WBC 14.9*  --   --  8.9  RBC 2.88*  --   --  2.72*  HCT 24.4*   < > 22.9* 23.5*  PLT 122*  --   --  102*   < > = values in this interval not displayed.   Recent Labs    12/05/18 0359 12/06/18 0245  NA 141 141  K 4.1 3.8  CL 107 109  CO2 25 23  BUN 17 29*  CREATININE 0.79 1.03*  GLUCOSE 174* 194*  CALCIUM 7.5* 7.3*   Recent Labs    12/04/18 0800  INR 1.2    still had HANNA boot on her foot from OR. removed.  Dg Chest Port 1 View  Result Date: 12/05/2018 CLINICAL DATA:  Fever. EXAM: PORTABLE CHEST 1 VIEW COMPARISON:  Radiograph of December 04, 2018. FINDINGS: The heart size and mediastinal contours are within normal limits. No pneumothorax or pleural effusion is noted. Right lung is clear. Elevated left hemidiaphragm is noted with minimal left basilar subsegmental atelectasis. The visualized skeletal structures are unremarkable. IMPRESSION: Minimal left basilar subsegmental atelectasis. Electronically Signed   By: Lupita Raider, M.D.   On: 12/05/2018 21:19   Ct Head Code Stroke Wo Contrast  Result Date: 12/06/2018 CLINICAL DATA:  Code stroke. Acute mental status change. Hip surgery 2 days ago.  EXAM: CT HEAD WITHOUT CONTRAST TECHNIQUE: Contiguous axial images were obtained from the base of the skull through the vertex without intravenous contrast. COMPARISON:  MRI head 11/16/2017 FINDINGS: Brain: Moderate atrophy. Moderate chronic microvascular ischemic changes in the white matter. Small chronic infarct in the subinsular white matter on the left. Negative for acute cortical infarct. Negative for hemorrhage or mass. Vascular: Extensive vascular calcification especially in the distal vertebral arteries bilaterally. Negative for hyperdense vessel Skull: Negative Sinuses/Orbits: Mucosal edema paranasal sinuses specially the right maxillary sinus. Negative orbit. Other: 2.5 cm right parietal scalp mass with mixed fatty and soft tissue density is chronic and unchanged from prior studies. ASPECTS [facility] Stroke Program Early CT Score) - Ganglionic level infarction (caudate, lentiform nuclei, internal capsule, insula, M1-M3 cortex): 7 - Supraganglionic infarction (M4-M6 cortex): 3 Total score (0-10 with 10 being normal): 10 IMPRESSION: 1. No acute intracranial abnormality 2. Moderate atrophy and moderate chronic microvascular ischemic change 3. ASPECTS is 10 4. These results were called by telephone at the time of interpretation on 12/06/2018 at 9:27 am to Dr. Laurence Slate , who verbally acknowledged these results. Electronically Signed   By: Marlan Palau M.D.   On: 12/06/2018 09:27    Assessment/Plan: 2 Days Post-Op Procedure(s) (LRB): INTRAMEDULLARY (IM) NAIL INTERTROCHANTRIC (Left) Up with therapy, continue to watch Hgb.  Dressing change.  Eldred MangesMark C Ersie Savino 12/06/2018, 10:47 AM

## 2018-12-07 ENCOUNTER — Encounter (HOSPITAL_COMMUNITY): Payer: Self-pay | Admitting: Orthopaedic Surgery

## 2018-12-07 LAB — TYPE AND SCREEN
ABO/RH(D): O POS
Antibody Screen: NEGATIVE
Unit division: 0

## 2018-12-07 LAB — GLUCOSE, CAPILLARY
Glucose-Capillary: 200 mg/dL — ABNORMAL HIGH (ref 70–99)
Glucose-Capillary: 269 mg/dL — ABNORMAL HIGH (ref 70–99)
Glucose-Capillary: 175 mg/dL — ABNORMAL HIGH (ref 70–99)
Glucose-Capillary: 139 mg/dL — ABNORMAL HIGH (ref 70–99)

## 2018-12-07 LAB — BASIC METABOLIC PANEL
Anion gap: 8 (ref 5–15)
BUN: 20 mg/dL (ref 8–23)
CO2: 23 mmol/L (ref 22–32)
Calcium: 7.7 mg/dL — ABNORMAL LOW (ref 8.9–10.3)
Chloride: 110 mmol/L (ref 98–111)
Creatinine, Ser: 0.76 mg/dL (ref 0.44–1.00)
GFR calc Af Amer: >60 mL/min (ref >60)
GFR calc non Af Amer: >60 mL/min (ref >60)
Glucose, Bld: 230 mg/dL — ABNORMAL HIGH (ref 70–99)
Potassium: 3.5 mmol/L (ref 3.5–5.1)
Sodium: 141 mmol/L (ref 135–145)

## 2018-12-07 LAB — CBC
HCT: 25.7 % — ABNORMAL LOW (ref 36.0–46.0)
Hemoglobin: 8.4 g/dL — ABNORMAL LOW (ref 12.0–15.0)
MCH: 28.4 pg (ref 26.0–34.0)
MCHC: 32.7 g/dL (ref 30.0–36.0)
MCV: 86.8 fL (ref 80.0–100.0)
Platelets: 128 10*3/uL — ABNORMAL LOW (ref 150–400)
RBC: 2.96 MIL/uL — ABNORMAL LOW (ref 3.87–5.11)
RDW: 14.9 % (ref 11.5–15.5)
WBC: 8.7 10*3/uL (ref 4.0–10.5)
nRBC: 0.5 % — ABNORMAL HIGH (ref 0.0–0.2)

## 2018-12-07 LAB — BPAM RBC
Blood Product Expiration Date: 202004262359
ISSUE DATE / TIME: 202004111755
Unit Type and Rh: 5100

## 2018-12-07 LAB — URINE CULTURE: Culture: NO GROWTH

## 2018-12-07 LAB — MAGNESIUM: Magnesium: 2 mg/dL (ref 1.7–2.4)

## 2018-12-07 MED ORDER — INSULIN GLARGINE 100 UNIT/ML ~~LOC~~ SOLN
7.0000 [IU] | Freq: Every day | SUBCUTANEOUS | Status: DC
  Filled 2018-12-07: qty 0.07

## 2018-12-07 NOTE — NC FL2 (Signed)
[place] MEDICAID FL2 LEVEL OF CARE SCREENING TOOL     IDENTIFICATION  Patient Name: Kristin Grant Birthdate: 1941/07/06 Sex: female Admission Date (Current Location): 12/04/2018  [facility] [place]   Facility and Address:  [facility], [address], [place], [place] [address]      Provider Number: [id number]  Attending Physician Name and Address:  Zigmund DanielPowell, A Caldwell Jr., *  Relative Name and Phone Number:  Gerlene BurdockRichard [phone]    Current Level of Care: Hospital Recommended Level of Care: Skilled Nursing Facility Prior Approval Number: [id number]  Date Approved/Denied: 08/04/18 PASRR Number:    Discharge Plan: SNF    Current Diagnoses: Patient Active Problem List   Diagnosis Date Noted  . Intertrochanteric fracture of left hip (HCC) 12/04/2018  . Intertrochanteric fracture of left hip, closed, initial encounter (HCC) 12/04/2018  . Acute blood loss anemia 08/05/2018  . Closed intertrochanteric fracture of hip, right, initial encounter (HCC) 08/03/2018  . Malnutrition of moderate degree 08/03/2018  . Coronary artery disease 11/17/2017  . Thrombocytopenia (HCC) 11/17/2017  . Right arm fracture 11/17/2017  . Cerebral atrophy (HCC) 11/17/2017  . Hypokalemia 11/17/2017  . History of CVA (cerebrovascular accident) 11/15/2017  . Suspected stroke patient last known to be well 3 to 4.5 hours ago   . Closed fracture of proximal end of right humerus 09/18/17 10/16/2017  . Osteoporosis 01/09/2017  . Loss of weight 01/12/2016  . Stroke-like episode (HCC) s/p IV tPA 11/22/2015  . Diabetes mellitus, type II (HCC) 06/22/2014  . Protein-calorie malnutrition (HCC) 12/15/2013  . Fall 08/17/2013  . Back pain 08/17/2013  . Pressure ulcer, stage 1 07/06/2013  . Abnormal development of nail 03/28/2013  . Gait abnormality 12/10/2011  . Dementia (HCC) 11/21/2011  . Hypertension 11/21/2011  . Hypothyroidism 11/21/2011    Orientation  RESPIRATION BLADDER Height & Weight     Self, Place, Situation  O2(nasal cannula 2L/min) Incontinent, External catheter Weight: 134 lb 4.2 oz (60.9 kg) Height:  5\' 4"  (162.6 cm)  BEHAVIORAL SYMPTOMS/MOOD NEUROLOGICAL BOWEL NUTRITION STATUS      Continent Diet(see discharge summary)  AMBULATORY STATUS COMMUNICATION OF NEEDS Skin   Extensive Assist Verbally Surgical wounds, Other (Comment)(ecchymosis right arm and left abdomen, left and right hip closed surgical incisions)                       Personal Care Assistance Level of Assistance  Bathing, Feeding, Dressing, Total care Bathing Assistance: Maximum assistance Feeding assistance: Limited assistance Dressing Assistance: Maximum assistance Total Care Assistance: Maximum assistance   Functional Limitations Info  Sight, Hearing, Speech Sight Info: Impaired Hearing Info: Adequate Speech Info: Adequate    SPECIAL CARE FACTORS FREQUENCY  PT (By licensed PT), OT (By licensed OT)     PT Frequency: min 5x weekly OT Frequency: min 5x weekly            Contractures Contractures Info: Not present    Additional Factors Info  Code Status, Allergies Code Status Info: full Allergies Info: No Known Allergies           Current Medications (12/07/2018):  This is the current hospital active medication list Current Facility-Administered Medications  Medication Dose Route Frequency Provider Last Rate Last Dose  . 0.9 %  sodium chloride infusion (Manually program via Guardrails IV Fluids)   Intravenous Once Zigmund DanielPowell, A Caldwell Jr., MD      . acetaminophen (TYLENOL) tablet 650 mg  650 mg Oral  Q6H Zigmund Daniel., MD   650 mg at 12/07/18 0615  . albuterol (PROVENTIL) (2.5 MG/3ML) 0.083% nebulizer solution 3 mL  3 mL Inhalation Q6H PRN Eldred Manges, MD      . aspirin EC tablet 325 mg  325 mg Oral Q breakfast Eldred Manges, MD   325 mg at 12/07/18 0841  . celecoxib (CELEBREX) capsule 200 mg  200 mg Oral BID Eldred Manges, MD    200 mg at 12/07/18 0841  . clopidogrel (PLAVIX) tablet 75 mg  75 mg Oral Daily Eldred Manges, MD   75 mg at 12/07/18 0841  . dextrose 5 % in lactated ringers infusion   Intravenous Continuous Zigmund Daniel., MD 75 mL/hr at 12/07/18 0840    . docusate sodium (COLACE) capsule 100 mg  100 mg Oral BID Eldred Manges, MD   100 mg at 12/07/18 0842  . feeding supplement (ENSURE ENLIVE) (ENSURE ENLIVE) liquid 237 mL  237 mL Oral BID BM Eldred Manges, MD   237 mL at 12/05/18 1340  . ferrous sulfate tablet 325 mg  325 mg Oral Q breakfast Eldred Manges, MD   325 mg at 12/07/18 0842  . insulin aspart (novoLOG) injection 0-5 Units  0-5 Units Subcutaneous QHS Zigmund Daniel., MD      . insulin aspart (novoLOG) injection 0-9 Units  0-9 Units Subcutaneous TID WC Zigmund Daniel., MD   2 Units at 12/07/18 0845  . levothyroxine (SYNTHROID, LEVOTHROID) tablet 50 mcg  50 mcg Oral QAC breakfast Eldred Manges, MD   50 mcg at 12/07/18 [phone]  . linagliptin (TRADJENTA) tablet 5 mg  5 mg Oral Daily Eldred Manges, MD   5 mg at 12/07/18 0843  . menthol-cetylpyridinium (CEPACOL) lozenge 3 mg  1 lozenge Oral PRN Eldred Manges, MD       Or  . phenol (CHLORASEPTIC) mouth spray 1 spray  1 spray Mouth/Throat PRN Eldred Manges, MD      . metoCLOPramide (REGLAN) tablet 5-10 mg  5-10 mg Oral Q8H PRN Eldred Manges, MD       Or  . metoCLOPramide (REGLAN) injection 5-10 mg  5-10 mg Intravenous Q8H PRN Eldred Manges, MD      . mirtazapine (REMERON) tablet 30 mg  30 mg Oral QHS Eldred Manges, MD   30 mg at 12/06/18 2019  . multivitamin with minerals tablet 1 tablet  1 tablet Oral Daily Eldred Manges, MD   1 tablet at 12/07/18 0843  . naloxone [facility]) injection 0.4 mg  0.4 mg Intravenous PRN Zigmund Daniel., MD      . ondansetron [facility]) tablet 4 mg  4 mg Oral Q6H PRN Eldred Manges, MD       Or  . ondansetron [facility]) injection 4 mg  4 mg Intravenous Q6H PRN Eldred Manges, MD      . pantoprazole (PROTONIX) EC  tablet 40 mg  40 mg Oral Daily Eldred Manges, MD   40 mg at 12/07/18 0843  . polyethylene glycol (MIRALAX / GLYCOLAX) packet 17 g  17 g Oral Daily Zigmund Daniel., MD   17 g at 12/07/18 0844  . propranolol (INDERAL) tablet 40 mg  40 mg Oral BID Zigmund Daniel., MD   40 mg at 12/06/18 2020  . thiamine (B-1) injection 100 mg  100 mg Intravenous Daily Zigmund Daniel.,  MD   100 mg at 12/06/18 1918  . traMADol (ULTRAM) tablet 50 mg  50 mg Oral Q6H PRN Zigmund Daniel., MD   50 mg at 12/07/18 8333     Discharge Medications: Please see discharge summary for a list of discharge medications.  Relevant Imaging Results:  Relevant Lab Results:   Additional Information SSN: [id number]  Gildardo Griffes, LCSW

## 2018-12-07 NOTE — Progress Notes (Signed)
   Subjective: 3 Days Post-Op Procedure(s) (LRB): INTRAMEDULLARY (IM) NAIL INTERTROCHANTRIC (Left) Patient reports pain as mild and moderate.    Objective: Vital signs in last 24 hours: Temp:  [97.7 F (36.5 C)-98.8 F (37.1 C)] 97.7 F (36.5 C) (04/13 1139) Pulse Rate:  [59-124] 91 (04/13 1139) Resp:  [18-20] 18 (04/13 1139) BP: (102-151)/(45-84) 113/52 (04/13 1139) SpO2:  [92 %-97 %] 94 % (04/13 1139) Weight:  [60.9 kg] 60.9 kg (04/13 0422)  Intake/Output from previous day: 04/12 0701 - 04/13 0700 In: 1097.5 [I.V.:1097.5] Out: 200 [Urine:200] Intake/Output this shift: Total I/O In: 150 [P.O.:150] Out: -   Recent Labs    12/05/18 0359 12/05/18 1433 12/05/18 2255 12/06/18 0245 12/07/18 0906  HGB 7.6* 6.8* 7.4* 7.5* 8.4*   Recent Labs    12/06/18 0245 12/07/18 0906  WBC 8.9 8.7  RBC 2.72* 2.96*  HCT 23.5* 25.7*  PLT 102* 128*   Recent Labs    12/06/18 0245 12/07/18 0906  NA 141 141  K 3.8 3.5  CL 109 110  CO2 23 23  BUN 29* 20  CREATININE 1.03* 0.76  GLUCOSE 194* 230*  CALCIUM 7.3* 7.7*   No results for input(s): LABPT, INR in the last 72 hours.  Neurologically intact No results found.  Assessment/Plan: 3 Days Post-Op Procedure(s) (LRB): INTRAMEDULLARY (IM) NAIL INTERTROCHANTRIC (Left) Plan:  SNF , called [facility]  [phone] and they will check her green sweater to see if her glasses are in the pocket and will call secretary 6N if they find them in her room.   Eldred Manges 12/07/2018, 2:04 PM

## 2018-12-07 NOTE — TOC Initial Note (Signed)
Transition of Care [facility]) - Initial/Assessment Note    Patient Details  Name: Kristin Grant MRN: [id number] Date of Birth: [birth year]  Transition of Care [facility]) CM/SW Contact:    Gildardo Griffes, LCSW Phone Number: 12/07/2018, 9:58 AM  Clinical Narrative:                  CSW consulted with patient's daughter Kathie Rhodes regarding SNF recommendation. She reports patient has been to [facility] when she previously had hip surgery. Kathie Rhodes states patient's son Gerlene Burdock is POA who makes decisions and to call him. CSW called Richard who confirms patient went to short term rehab at [facility] in the past. He reports patient did well there and he requested CSW to send referral to [facility]. Richard is aware of no visitor policy at the hospital and at [facility]. Richard requests to be updated on if bed available at [facility] and when patient is medically stable to transfer to SNF.   Expected Discharge Plan: Skilled Nursing Facility Barriers to Discharge: No Barriers Identified   Patient Goals and CMS Choice Patient states their goals for this hospitalization and ongoing recovery are:: patient disoriented, family wants patient to go to [facility] for rehab before returning home. CMS Medicare.gov Compare Post Acute Care list provided to:: Patient Represenative (must comment)(POA Gerlene Burdock (son)) Choice offered to / list presented to : Adult Children  Expected Discharge Plan and Services Expected Discharge Plan: Skilled Nursing Facility     Post Acute Care Choice: Skilled Nursing Facility Living arrangements for the past 2 months: Assisted Living Facility([facility])                 DME Arranged: N/A DME Agency: NA HH Arranged: NA HH Agency: NA  Prior Living Arrangements/Services Living arrangements for the past 2 months: Assisted Living Facility([facility]) Lives with:: Self Patient language and need for interpreter reviewed:: Yes Do you feel safe going back to the place  where you live?: No   recommended short term rehab before returning home  Need for Family Participation in Patient Care: Yes (Comment) Care giver support system in place?: Yes (comment)   Criminal Activity/Legal Involvement Pertinent to Current Situation/Hospitalization: No - Comment as needed  Activities of Daily Living   ADL Screening (condition at time of admission) Patient's cognitive ability adequate to safely complete daily activities?: No Patient able to express need for assistance with ADLs?: No Independently performs ADLs?: No  Permission Sought/Granted Permission sought to share information with : Case Manager, Magazine features editor, Family Supports Permission granted to share information with : Yes, Verbal Permission Granted  Share Information with NAME: Gerlene Burdock  Permission granted to share info w AGENCY: SNFs  Permission granted to share info w Relationship: son (POA)  Permission granted to share info w Contact Information: [phone]  Emotional Assessment Appearance:: Appears stated age Attitude/Demeanor/Rapport: Unable to Assess Affect (typically observed): Unable to Assess Orientation: : Oriented to Self, Oriented to Place, Oriented to Situation Alcohol / Substance Use: Not Applicable Psych Involvement: No (comment)  Admission diagnosis:  Fall [W19.XXXA] Intertrochanteric fracture of left hip, closed, initial encounter [facility]) [S72.142A] Patient Active Problem List   Diagnosis Date Noted  . Intertrochanteric fracture of left hip (HCC) 12/04/2018  . Intertrochanteric fracture of left hip, closed, initial encounter (HCC) 12/04/2018  . Acute blood loss anemia 08/05/2018  . Closed intertrochanteric fracture of hip, right, initial encounter (HCC) 08/03/2018  . Malnutrition of moderate degree 08/03/2018  . Coronary artery  disease 11/17/2017  . Thrombocytopenia (HCC) 11/17/2017  . Right arm fracture 11/17/2017  . Cerebral atrophy (HCC) 11/17/2017  .  Hypokalemia 11/17/2017  . History of CVA (cerebrovascular accident) 11/15/2017  . Suspected stroke patient last known to be well 3 to 4.5 hours ago   . Closed fracture of proximal end of right humerus 09/18/17 10/16/2017  . Osteoporosis 01/09/2017  . Loss of weight 01/12/2016  . Stroke-like episode (HCC) s/p IV tPA 11/22/2015  . Diabetes mellitus, type II (HCC) 06/22/2014  . Protein-calorie malnutrition (HCC) 12/15/2013  . Fall 08/17/2013  . Back pain 08/17/2013  . Pressure ulcer, stage 1 07/06/2013  . Abnormal development of nail 03/28/2013  . Gait abnormality 12/10/2011  . Dementia (HCC) 11/21/2011  . Hypertension 11/21/2011  . Hypothyroidism 11/21/2011   PCP:  Salley Scarleturham, Kawanta F, MD Pharmacy:   [facility] [place],  - [address] [place] [address] Phone: [phone] Fax: [phone]     Social Determinants of Health (SDOH) Interventions    Readmission Risk Interventions No flowsheet data found.

## 2018-12-07 NOTE — Progress Notes (Signed)
CSW has consulted with patient's POA Richard throughout the day who reports he preferred [facility], CSW explained they declined due to no beds however [facility] in [place] accepted. Richard now reports he would like to [employer] options. CSW sent referrals to both [facility] and [facility] per Richard's request. Will update Richard on bed offer.   [place], [place] [phone]

## 2018-12-07 NOTE — Progress Notes (Signed)
  Speech Language Pathology Treatment: Dysphagia  Patient Details Name: Kristin Grant MRN: [id number] DOB: 06-28-1941 Today's Date: 12/07/2018 Time: 0459-9774 SLP Time Calculation (min) (ACUTE ONLY): 15 min  Assessment / Plan / Recommendation Clinical Impression  Patient seen to address dysphagia goals with dys 3 (mech soft ) solids and thin liquids. Patient did not exhibit any overt s/s of aspiration or penetration but was suspected to have delayed swallow initiation. Patient is edentulous and did present with prolonged mastication with soft solids. Patient did not want to eat much and did appear to get a little fatigued when eating. Per RN and looking at patient's breakfast tray, she ate approximately 80% of her breakfast today and did not exhibit any coughing or throat clearing, but did have some difficulty with chewing. (Breakfast tray was regular solids). As per comparison with recent therapy notes, patient is significantly more alert and able to actively participate in PO intake.     HPI HPI: Pt is a 78 y.o. F with significant PMH of hypertension, CVA, dementia who presents with fall from assisted living. Found to have acute comminuted intertrochanteric fracture of the left hip. Possible left superior and inferior pubic rami fx. Now s/p left intertrochanteric IM nail. Code stroke called on 4/12 with AMS but CT scan negative for acute abnormality. Per neuro, presentation less likely stroke vs medication induced mental status change.      SLP Plan  Continue with current plan of care       Recommendations  Diet recommendations: Dysphagia 3 (mechanical soft);Thin liquid Liquids provided via: Cup;Straw Medication Administration: Crushed with puree Supervision: Patient able to self feed;Full supervision/cueing for compensatory strategies Compensations: Minimize environmental distractions;Slow rate;Small sips/bites Postural Changes and/or Swallow Maneuvers: Seated upright 90 degrees                 Oral Care Recommendations: Oral care BID Follow up Recommendations: Skilled Nursing facility SLP Visit Diagnosis: Dysphagia, unspecified (R13.10) Plan: Continue with current plan of care       GO                Pablo Lawrence 12/07/2018, 4:31 PM   Angela Nevin, MA, CCC-SLP Speech Therapy [facility] Acute Rehab Pager: [phone]

## 2018-12-07 NOTE — Discharge Summary (Deleted)
PROGRESS NOTE    Kristin Grant  ZOX:[id number] DOB: 08/22/1941 DOA: 12/04/2018 PCP: Salley Scarlet, MD  Brief Narrative:  Per HPI  Kristin Grant  is Kristin Grant 78 y.o. female, with hypertension, dm2, hypothyroidism, h/o CVA, dementia, apparently fell at assisted living.  Its unclear how she fell. Pt states that she fell while getting out of bed to go to the bathroom, but she has dementia.   Pt denies any cp, palp, sob, n/v, diarrhea, brbpr, black stool, dysuria, but once again history is limited by her dementia,  She is axox1 (person), not place or time.    Pt was apparently Tukker Byrns full code while at SNF. I attempted to contact her son, but got voicemail. Left message that she will be admitted to [facility].    In ED,  T 98.8, afebrile,  Pt 65, Bp 119/59, pox 100% on RA  Xray:  IMPRESSION: 1. Acute comminuted intertrochanteric fracture of the left hip. 2. Cortical irregularity through the left superior and inferior pubic rami, suspicious for associated pelvic fractures.   Wbc 11.9, hgb 13.0, Plt 157 Na 141, K 3.6 Bun 17, Creatinine 0.73 Glucose 144  ED spoke with orthopedics and they requested admission to [facility] for L hip intertrochanteric fracture with possible left superior and inferior pubic rami fracture.   Assessment & Plan:   Principal Problem:   Intertrochanteric fracture of left hip (HCC) Active Problems:   Dementia (HCC)   Hypertension   Hypothyroidism   Diabetes mellitus, type II (HCC)   History of CVA (cerebrovascular accident)   Intertrochanteric fracture of left hip, closed, initial encounter (HCC)  Acute Encephalopathy: Abrupt change in mental status 4/12.  Suspect toxic/metabolic related to meds/hospitalization.  Code stroke was called given abrupt change in mental status. She's gradually improved over past day Head CT negative for stroke Seen by neurology, code stroke canceled. Hold off on MRI unless pt does not continue to improve NPO yesterday per  speech, today advanced to dysphagia 3 diet Delirium precautions  Left intertrochanteric hip fracture   Possible Left Superior and Inferior Pubic Rami Fx Appreciate ortho assistance  S/p L IM nail on 4/10 PT/OT today ASA 325 mg daily for DVT ppx APAP, celebrex, tramadol, morphine for breakthrough  Post Operative Blood Loss Anemia: Stable H/H today.  Pt was on DAPT at time of surgery.  Transfuse for <7 or symptomatic. S/p 1 unit pRBC 4/11 Stable today  Dm2 Tradjenta resumed post op by ortho Resume lantus at lower dose Hold mealtime for now ACHS SSI   Hypertension Cont Propranolol  po bid Lisinopril on hold, pt with soft BP  H/o CVA Cont aspirin, Cont plavix  Hypothyroidism Cont Levothyroxine 50 micrograms po qday  Dementia Cont Remeron  po qhs  Gerd Cont Protonix  po qday  Thrombocytopenia:  Follow   DVT prophylaxis: SCD Code Status: full  Family Communication: none at bedside Disposition Plan: pending placement   Consultants:   orthopedics  Procedures:   none  Antimicrobials:  Anti-infectives (From admission, onward)   Start     Dose/Rate Route Frequency Ordered Stop   12/05/18 0600  ceFAZolin (ANCEF) IVPB 2g/100 mL premix     2 g 200 mL/hr over 30 Minutes Intravenous On call to O.R. 12/04/18 1300 12/04/18 1430     Subjective: Improved mental status Still confused, Kahleel Fadeley&Ox1 today, but has improved since yesterday  Objective: Vitals:   12/07/18 0015 12/07/18 0422 12/07/18 1139 12/07/18 1417  BP: 105/79 (!) 151/68 (!) 113/52 Marland Kitchen)  142/77  Pulse: (!) 106 (!) 109 91 93  Resp: 20 20 18 18   Temp: 98.3 F (36.8 C) 98 F (36.7 C) 97.7 F (36.5 C) 98.4 F (36.9 C)  TempSrc: Oral Oral Oral Oral  SpO2: 92% 92% 94% 95%  Weight:  60.9 kg    Height:        Intake/Output Summary (Last 24 hours) at 12/07/2018 1524 Last data filed at 12/07/2018 1416 Gross per 24 hour  Intake 1118.95 ml  Output 350 ml  Net 768.95 ml   Filed Weights    12/05/18 0717 12/06/18 0500 12/07/18 0422  Weight: 62.4 kg 64.4 kg 60.9 kg    Examination:  General: No acute distress. Cardiovascular: Heart sounds show Aalyah Mansouri regular rate, and rhythm. Lungs: Clear to auscultation bilaterally  Abdomen: Soft, nontender, nondistended Neurological: Alert and oriented 3. Moves all extremities 4. Cranial nerves II through XII grossly intact. Skin: Warm and dry. No rashes or lesions. Extremities: LLE with intact dressing Psychiatric: Mood and affect are normal. Insight and judgment are appropriate.     Data Reviewed: I have personally reviewed following labs and imaging studies  CBC: Recent Labs  Lab 12/04/18 0039 12/05/18 0359 12/05/18 1433 12/05/18 2255 12/06/18 0245 12/07/18 0906  WBC 11.9* 14.9*  --   --  8.9 8.7  NEUTROABS 8.5*  --   --   --   --   --   HGB 13.0 7.6* 6.8* 7.4* 7.5* 8.4*  HCT 41.6 24.4* 21.7* 22.9* 23.5* 25.7*  MCV 86.7 84.7  --   --  86.4 86.8  PLT 157 122*  --   --  102* 128*   Basic Metabolic Panel: Recent Labs  Lab 12/04/18 0039 12/05/18 0359 12/06/18 0245 12/07/18 0906  NA 141 141 141 141  K 3.6 4.1 3.8 3.5  CL 106 107 109 110  CO2 27 25 23 23   GLUCOSE 144* 174* 194* 230*  BUN 17 17 29* 20  CREATININE 0.73 0.79 1.03* 0.76  CALCIUM 8.2* 7.5* 7.3* 7.7*  MG  --   --   --  2.0   GFR: Estimated Creatinine Clearance: 49.2 mL/min (by C-G formula based on SCr of 0.76 mg/dL). Liver Function Tests: Recent Labs  Lab 12/04/18 0039 12/05/18 0359  AST 19 20  ALT 20 15  ALKPHOS 68 43  BILITOT 0.4 0.5  PROT 6.2* 4.4*  ALBUMIN 3.6 2.6*   No results for input(s): LIPASE, AMYLASE in the last 168 hours. No results for input(s): AMMONIA in the last 168 hours. Coagulation Profile: Recent Labs  Lab 12/04/18 0800  INR 1.2   Cardiac Enzymes: Recent Labs  Lab 12/04/18 0039  CKTOTAL 46   BNP (last 3 results) No results for input(s): PROBNP in the last 8760 hours. HbA1C: No results for input(s): HGBA1C in the  last 72 hours. CBG: Recent Labs  Lab 12/06/18 1211 12/06/18 1611 12/06/18 2038 12/07/18 0804 12/07/18 1237  GLUCAP 156* 142* 144* 200* 269*   Lipid Profile: No results for input(s): CHOL, HDL, LDLCALC, TRIG, CHOLHDL, LDLDIRECT in the last 72 hours. Thyroid Function Tests: No results for input(s): TSH, T4TOTAL, FREET4, T3FREE, THYROIDAB in the last 72 hours. Anemia Panel: No results for input(s): VITAMINB12, FOLATE, FERRITIN, TIBC, IRON, RETICCTPCT in the last 72 hours. Sepsis Labs: No results for input(s): PROCALCITON, LATICACIDVEN in the last 168 hours.  Recent Results (from the past 240 hour(s))  Surgical pcr screen     Status: None   Collection Time: 12/04/18  4:52  AM  Result Value Ref Range Status   MRSA, PCR NEGATIVE NEGATIVE Final   Staphylococcus aureus NEGATIVE NEGATIVE Final    Comment: (NOTE) The Xpert SA Assay (FDA approved for NASAL specimens in patients 80 years of age and older), is one component of Breeann Reposa comprehensive surveillance program. It is not intended to diagnose infection nor to guide or monitor treatment. Performed at [facility], [address] [place], [place] [address]   Culture, blood (routine x 2)     Status: None (Preliminary result)   Collection Time: 12/05/18 10:11 PM  Result Value Ref Range Status   Specimen Description BLOOD RIGHT HAND  Final   Special Requests   Final    BOTTLES DRAWN AEROBIC ONLY Blood Culture adequate volume   Culture   Final    NO GROWTH 2 DAYS Performed at [facility], [address] [place], [place] [address]    Report Status PENDING  Incomplete  Culture, blood (routine x 2)     Status: None (Preliminary result)   Collection Time: 12/05/18 11:11 PM  Result Value Ref Range Status   Specimen Description BLOOD RIGHT HAND  Final   Special Requests   Final    BOTTLES DRAWN AEROBIC ONLY Blood Culture results may not be optimal due to an inadequate volume of blood received in culture bottles   Culture    Final    NO GROWTH 2 DAYS Performed at [facility], [address] [place], [place] [address]    Report Status PENDING  Incomplete  Culture, Urine     Status: None   Collection Time: 12/06/18  8:30 AM  Result Value Ref Range Status   Specimen Description URINE, RANDOM  Final   Special Requests NONE  Final   Culture   Final    NO GROWTH Performed at [facility], [address] [place], [place] [address]    Report Status 12/07/2018 FINAL  Final         Radiology Studies: Dg Chest Port 1 View  Result Date: 12/05/2018 CLINICAL DATA:  Fever. EXAM: PORTABLE CHEST 1 VIEW COMPARISON:  Radiograph of December 04, 2018. FINDINGS: The heart size and mediastinal contours are within normal limits. No pneumothorax or pleural effusion is noted. Right lung is clear. Elevated left hemidiaphragm is noted with minimal left basilar subsegmental atelectasis. The visualized skeletal structures are unremarkable. IMPRESSION: Minimal left basilar subsegmental atelectasis. Electronically Signed   By: Lupita RaiderJames  Green Jr, M.D.   On: 12/05/2018 21:19   Ct Head Code Stroke Wo Contrast  Result Date: 12/06/2018 CLINICAL DATA:  Code stroke. Acute mental status change. Hip surgery 2 days ago. EXAM: CT HEAD WITHOUT CONTRAST TECHNIQUE: Contiguous axial images were obtained from the base of the skull through the vertex without intravenous contrast. COMPARISON:  MRI head 11/16/2017 FINDINGS: Brain: Moderate atrophy. Moderate chronic microvascular ischemic changes in the white matter. Small chronic infarct in the subinsular white matter on the left. Negative for acute cortical infarct. Negative for hemorrhage or mass. Vascular: Extensive vascular calcification especially in the distal vertebral arteries bilaterally. Negative for hyperdense vessel Skull: Negative Sinuses/Orbits: Mucosal edema paranasal sinuses specially the right maxillary sinus. Negative orbit. 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 ASPECTS [facility]([place] Stroke Program Early CT Score) - Ganglionic level infarction (caudate, lentiform nuclei, internal capsule, insula, M1-M3 cortex): 7 - Supraganglionic infarction (M4-M6 cortex): 3  Total score (0-10 with 10 being normal): 10 IMPRESSION: 1. No acute intracranial abnormality 2. Moderate atrophy and moderate chronic microvascular ischemic change 3. ASPECTS is 10 4. These results were called by telephone at the time of interpretation on 12/06/2018 at 9:27 am to Dr. Laurence Slate , who verbally acknowledged these results. Electronically Signed   By: Marlan Palau M.D.   On: 12/06/2018 09:27        Scheduled Meds:  sodium chloride   Intravenous Once   acetaminophen  650 mg Oral Q6H   aspirin EC  325 mg Oral Q breakfast   celecoxib  200 mg Oral BID   clopidogrel  75 mg Oral Daily   docusate sodium  100 mg Oral BID   feeding supplement (ENSURE ENLIVE)  237 mL Oral BID BM   ferrous sulfate  325 mg Oral Q breakfast   insulin aspart  0-5 Units Subcutaneous QHS   insulin aspart  0-9 Units Subcutaneous TID WC   levothyroxine  50 mcg Oral QAC breakfast   linagliptin  5 mg Oral Daily   mirtazapine  30 mg Oral QHS   multivitamin with minerals  1 tablet Oral Daily   pantoprazole  40 mg Oral Daily   polyethylene glycol  17 g Oral Daily   propranolol  40 mg Oral BID   thiamine injection  100 mg Intravenous Daily   Continuous Infusions:  dextrose 5% lactated ringers 75 mL/hr at 12/07/18 0840     LOS: 3 days    Time spent: over 30 min    Lacretia Nicks, MD [facility] Pager AMION  If 7PM-7AM, please contact night-coverage [email or web address] Password [facility] 12/07/2018, 3:24 PM

## 2018-12-07 NOTE — Progress Notes (Signed)
Physical Therapy Treatment Patient Details Name: Kristin Grant MRN: [id number] DOB: [birth year] Today's Date: 12/07/2018    History of Present Illness Pt is a 78 y.o. F with significant PMH of hypertension, CVA, dementia who presents with fall from assisted living. Found to have acute comminuted intertrochanteric fracture of the left hip. Possible left superior and inferior pubic rami fx. Now s/p left intertrochanteric IM nail. Code stroke called on 4/12 with AMS but CT scan negative for acute abnormality.     PT Comments    Patient seen for activity progression and OOB to chair mobility. Patient required max assist face to face with therapist supporting patients body weight to ensure off loading. Patient hygiene performed and patient transitioned OOB to chair.    Follow Up Recommendations  SNF     Equipment Recommendations  None recommended by PT    Recommendations for Other Services       Precautions / Restrictions Precautions Precautions: Fall Restrictions Weight Bearing Restrictions: Yes LLE Weight Bearing: Partial weight bearing LLE Partial Weight Bearing Percentage or Pounds: 50%    Mobility  Bed Mobility Overal bed mobility: Needs Assistance Bed Mobility: Supine to Sit     Supine to sit: Max assist     General bed mobility comments: Max assist for all aspects of bed mobility to elevate trunk and use of chuck pad to rotate hips to EOB. Patient tolerated EOB sitting ~ 4 minutes  Transfers Overall transfer level: Needs assistance Equipment used: (face to face transfer ) Transfers: Sit to/from [employer] Sit to Stand: Max assist Stand pivot transfers: Max assist       General transfer comment: Max assist to power to standing, therapist with face to face positioning. Patient with flexed LLE assisting to offload during transfer, deferred RW and ambulation attempt in order to maintain PWBing with therapist off loading during transitional movement to  chair  Ambulation/Gait                 Stairs             Wheelchair Mobility    Modified Rankin (Stroke Patients Only)       Balance Overall balance assessment: Needs assistance Sitting-balance support: Feet supported Sitting balance-Leahy Scale: Fair     Standing balance support: During functional activity Standing balance-Leahy Scale: Zero                              Cognition Arousal/Alertness: Awake/alert Behavior During Therapy: Anxious Overall Cognitive Status: History of cognitive impairments - at baseline                                        Exercises      General Comments        Pertinent Vitals/Pain Pain Assessment: Faces Faces Pain Scale: Hurts even more Pain Location: LLE with movement Pain Descriptors / Indicators: Grimacing;Operative site guarding Pain Intervention(s): Limited activity within patient's tolerance    Home Living                      Prior Function            PT Goals (current goals can now be found in the care plan section) Acute Rehab PT Goals Patient Stated Goal: none stated PT Goal Formulation: Patient unable to participate in goal  setting Time For Goal Achievement: 12/20/18 Potential to Achieve Goals: Fair Progress towards PT goals: Progressing toward goals    Frequency    Min 3X/week      PT Plan Current plan remains appropriate    Co-evaluation              AM-PAC PT "6 Clicks" Mobility   Outcome Measure  Help needed turning from your back to your side while in a flat bed without using bedrails?: A Lot Help needed moving from lying on your back to sitting on the side of a flat bed without using bedrails?: Total Help needed moving to and from a bed to a chair (including a wheelchair)?: Total Help needed standing up from a chair using your arms (e.g., wheelchair or bedside chair)?: Total Help needed to walk in hospital room?: Total Help needed  climbing 3-5 steps with a railing? : Total 6 Click Score: 7    End of Session Equipment Utilized During Treatment: Gait belt Activity Tolerance: Treatment limited secondary to agitation Patient left: in chair;with call bell/phone within reach;with chair alarm set;with nursing/sitter in room Nurse Communication: Mobility status PT Visit Diagnosis: Other abnormalities of gait and mobility (R26.89);Pain Pain - Right/Left: Left Pain - part of body: Leg     Time: [id number] PT Time Calculation (min) (ACUTE ONLY): 22 min  Charges:  $Therapeutic Activity: 8-22 mins                     Charlotte Crumbevon Jacqulene Huntley, PT DPT  Board Certified Neurologic Specialist Acute Rehabilitation Services Pager [phone] Office [phone]    Fabio AsaDevon J Robbert Langlinais 12/07/2018, 8:44 AM

## 2018-12-07 NOTE — Progress Notes (Signed)
CSW attempted to reach out to family listed on contact sheet for SNF consult. CSW lvm for family members, awaiting call backs.   [place], [place] [phone]

## 2018-12-07 NOTE — Progress Notes (Addendum)
PROGRESS NOTE    Kristin Grant  BBC:[id number] DOB: 1940/12/14 DOA: 12/04/2018 PCP: Salley Scarlet, MD  Brief Narrative:  Per HPI  Kristin Grant  is Kristin Grant 78 y.o. female, with hypertension, dm2, hypothyroidism, h/o CVA, dementia, apparently fell at assisted living.  Its unclear how she fell. Pt states that she fell while getting out of bed to go to the bathroom, but she has dementia.   Pt denies any cp, palp, sob, n/v, diarrhea, brbpr, black stool, dysuria, but once again history is limited by her dementia,  She is axox1 (person), not place or time.    Pt was apparently Kealie Barrie full code while at SNF. I attempted to contact her son, but got voicemail. Left message that she will be admitted to [facility].    In ED,  T 98.8, afebrile,  Pt 65, Bp 119/59, pox 100% on RA  Xray:  IMPRESSION: 1. Acute comminuted intertrochanteric fracture of the left hip. 2. Cortical irregularity through the left superior and inferior pubic rami, suspicious for associated pelvic fractures.   Wbc 11.9, hgb 13.0, Plt 157 Na 141, K 3.6 Bun 17, Creatinine 0.73 Glucose 144  ED spoke with orthopedics and they requested admission to [facility] for L hip intertrochanteric fracture with possible left superior and inferior pubic rami fracture.   Assessment & Plan:   Principal Problem:   Intertrochanteric fracture of left hip (HCC) Active Problems:   Dementia (HCC)   Hypertension   Hypothyroidism   Diabetes mellitus, type II (HCC)   History of CVA (cerebrovascular accident)   Intertrochanteric fracture of left hip, closed, initial encounter (HCC)  Acute Encephalopathy: Abrupt change in mental status 4/12.  Suspect toxic/metabolic related to meds/hospitalization.  Code stroke was called given abrupt change in mental status. She's gradually improved over past day Head CT negative for stroke Seen by neurology, code stroke canceled. Hold off on MRI unless pt does not continue to improve NPO yesterday per  speech, today advanced to dysphagia 3 diet Delirium precautions  Left intertrochanteric hip fracture   Possible Left Superior and Inferior Pubic Rami Fx Appreciate ortho assistance  S/p L IM nail on 4/10 PT/OT today ASA 325 mg daily for DVT ppx APAP, celebrex, tramadol, morphine for breakthrough  Post Operative Blood Loss Anemia: Stable H/H today.  Pt was on DAPT at time of surgery.  Transfuse for <7 or symptomatic. S/p 1 unit pRBC 4/11 Stable today  Dm2 Tradjenta resumed post op  Hyperglycemia, stop D5 Resume lantus if hyperglycemia continues Hold mealtime for now ACHS SSI   Hypertension Cont Propranolol 40mg  po bid Lisinopril on hold, pt with soft BP  H/o CVA Cont aspirin, Cont plavix  Hypothyroidism Cont Levothyroxine 50 micrograms po qday  Dementia Cont Remeron 30mg  po qhs  Gerd Cont Protonix 40mg  po qday  Thrombocytopenia:  Follow   DVT prophylaxis: SCD Code Status: full  Family Communication: called sister Disposition Plan: pending placement   Consultants:   orthopedics  Procedures:   none  Antimicrobials:  Anti-infectives (From admission, onward)   Start     Dose/Rate Route Frequency Ordered Stop   12/05/18 0600  ceFAZolin (ANCEF) IVPB 2g/100 mL premix     2 g 200 mL/hr over 30 Minutes Intravenous On call to O.R. 12/04/18 1300 12/04/18 1430     Subjective: Improved mental status Still confused, Kristin Grant&Ox1 today, but has improved since yesterday  Objective: Vitals:   12/07/18 0015 12/07/18 0422 12/07/18 1139 12/07/18 1417  BP: 105/79 (!) 151/68 (!) 113/52 Marland Kitchen)  142/77  Pulse: (!) 106 (!) 109 91 93  Resp: 20 20 18 18   Temp: 98.3 F (36.8 C) 98 F (36.7 C) 97.7 F (36.5 C) 98.4 F (36.9 C)  TempSrc: Oral Oral Oral Oral  SpO2: 92% 92% 94% 95%  Weight:  60.9 kg    Height:        Intake/Output Summary (Last 24 hours) at 12/07/2018 1541 Last data filed at 12/07/2018 1416 Gross per 24 hour  Intake 1118.95 ml  Output 350 ml  Net  768.95 ml   Filed Weights   12/05/18 0717 12/06/18 0500 12/07/18 0422  Weight: 62.4 kg 64.4 kg 60.9 kg    Examination:  General: No acute distress. Cardiovascular: Heart sounds show Brittany Amirault regular rate, and rhythm. Lungs: Clear to auscultation bilaterally  Abdomen: Soft, nontender, nondistended Neurological: Alert and oriented 3. Moves all extremities 4. Cranial nerves II through XII grossly intact. Skin: Warm and dry. No rashes or lesions. Extremities: LLE with intact dressing Psychiatric: Mood and affect are normal. Insight and judgment are appropriate.     Data Reviewed: I have personally reviewed following labs and imaging studies  CBC: Recent Labs  Lab 12/04/18 0039 12/05/18 0359 12/05/18 1433 12/05/18 2255 12/06/18 0245 12/07/18 0906  WBC 11.9* 14.9*  --   --  8.9 8.7  NEUTROABS 8.5*  --   --   --   --   --   HGB 13.0 7.6* 6.8* 7.4* 7.5* 8.4*  HCT 41.6 24.4* 21.7* 22.9* 23.5* 25.7*  MCV 86.7 84.7  --   --  86.4 86.8  PLT 157 122*  --   --  102* 128*   Basic Metabolic Panel: Recent Labs  Lab 12/04/18 0039 12/05/18 0359 12/06/18 0245 12/07/18 0906  NA 141 141 141 141  K 3.6 4.1 3.8 3.5  CL 106 107 109 110  CO2 27 25 23 23   GLUCOSE 144* 174* 194* 230*  BUN 17 17 29* 20  CREATININE 0.73 0.79 1.03* 0.76  CALCIUM 8.2* 7.5* 7.3* 7.7*  MG  --   --   --  2.0   GFR: Estimated Creatinine Clearance: 49.2 mL/min (by C-G formula based on SCr of 0.76 mg/dL). Liver Function Tests: Recent Labs  Lab 12/04/18 0039 12/05/18 0359  AST 19 20  ALT 20 15  ALKPHOS 68 43  BILITOT 0.4 0.5  PROT 6.2* 4.4*  ALBUMIN 3.6 2.6*   No results for input(s): LIPASE, AMYLASE in the last 168 hours. No results for input(s): AMMONIA in the last 168 hours. Coagulation Profile: Recent Labs  Lab 12/04/18 0800  INR 1.2   Cardiac Enzymes: Recent Labs  Lab 12/04/18 0039  CKTOTAL 46   BNP (last 3 results) No results for input(s): PROBNP in the last 8760 hours. HbA1C: No  results for input(s): HGBA1C in the last 72 hours. CBG: Recent Labs  Lab 12/06/18 1211 12/06/18 1611 12/06/18 2038 12/07/18 0804 12/07/18 1237  GLUCAP 156* 142* 144* 200* 269*   Lipid Profile: No results for input(s): CHOL, HDL, LDLCALC, TRIG, CHOLHDL, LDLDIRECT in the last 72 hours. Thyroid Function Tests: No results for input(s): TSH, T4TOTAL, FREET4, T3FREE, THYROIDAB in the last 72 hours. Anemia Panel: No results for input(s): VITAMINB12, FOLATE, FERRITIN, TIBC, IRON, RETICCTPCT in the last 72 hours. Sepsis Labs: No results for input(s): PROCALCITON, LATICACIDVEN in the last 168 hours.  Recent Results (from the past 240 hour(s))  Surgical pcr screen     Status: None   Collection Time: 12/04/18  4:52  AM  Result Value Ref Range Status   MRSA, PCR NEGATIVE NEGATIVE Final   Staphylococcus aureus NEGATIVE NEGATIVE Final    Comment: (NOTE) The Xpert SA Assay (FDA approved for NASAL specimens in patients 70 years of age and older), is one component of Kayzlee Wirtanen comprehensive surveillance program. It is not intended to diagnose infection nor to guide or monitor treatment. Performed at [facility], [address] [place], [place] [address]   Culture, blood (routine x 2)     Status: None (Preliminary result)   Collection Time: 12/05/18 10:11 PM  Result Value Ref Range Status   Specimen Description BLOOD RIGHT HAND  Final   Special Requests   Final    BOTTLES DRAWN AEROBIC ONLY Blood Culture adequate volume   Culture   Final    NO GROWTH 2 DAYS Performed at [facility], [address] [place], [place] [address]    Report Status PENDING  Incomplete  Culture, blood (routine x 2)     Status: None (Preliminary result)   Collection Time: 12/05/18 11:11 PM  Result Value Ref Range Status   Specimen Description BLOOD RIGHT HAND  Final   Special Requests   Final    BOTTLES DRAWN AEROBIC ONLY Blood Culture results may not be optimal due to an inadequate volume of blood  received in culture bottles   Culture   Final    NO GROWTH 2 DAYS Performed at [facility], [address] [place], [place] [address]    Report Status PENDING  Incomplete  Culture, Urine     Status: None   Collection Time: 12/06/18  8:30 AM  Result Value Ref Range Status   Specimen Description URINE, RANDOM  Final   Special Requests NONE  Final   Culture   Final    NO GROWTH Performed at [facility], [address], [place], [place] [address]    Report Status 12/07/2018 FINAL  Final         Radiology Studies: Dg Chest Port 1 View  Result Date: 12/05/2018 CLINICAL DATA:  Fever. EXAM: PORTABLE CHEST 1 VIEW COMPARISON:  Radiograph of December 04, 2018. FINDINGS: The heart size and mediastinal contours are within normal limits. No pneumothorax or pleural effusion is noted. Right lung is clear. Elevated left hemidiaphragm is noted with minimal left basilar subsegmental atelectasis. The visualized skeletal structures are unremarkable. IMPRESSION: Minimal left basilar subsegmental atelectasis. Electronically Signed   By: Lupita Raider, M.D.   On: 12/05/2018 21:19   Ct Head Code Stroke Wo Contrast  Result Date: 12/06/2018 CLINICAL DATA:  Code stroke. Acute mental status change. Hip surgery 2 days ago. EXAM: CT HEAD WITHOUT CONTRAST TECHNIQUE: Contiguous axial images were obtained from the base of the skull through the vertex without intravenous contrast. COMPARISON:  MRI head 11/16/2017 FINDINGS: Brain: Moderate atrophy. Moderate chronic microvascular ischemic changes in the white matter. Small chronic infarct in the subinsular white matter on the left. Negative for acute cortical infarct. Negative for hemorrhage or mass. Vascular: Extensive vascular calcification especially in the distal vertebral arteries bilaterally. Negative for hyperdense vessel Skull: Negative Sinuses/Orbits: Mucosal edema paranasal sinuses specially the right maxillary sinus. Negative orbit. Other: 2.5 cm  right parietal scalp mass with mixed fatty and soft tissue density is chronic and unchanged from prior studies. ASPECTS [facility] Stroke Program Early CT Score) - Ganglionic level infarction (caudate, lentiform nuclei, internal capsule, insula, M1-M3 cortex): 7 - Supraganglionic infarction (M4-M6 cortex): 3  Total score (0-10 with 10 being normal): 10 IMPRESSION: 1. No acute intracranial abnormality 2. Moderate atrophy and moderate chronic microvascular ischemic change 3. ASPECTS is 10 4. These results were called by telephone at the time of interpretation on 12/06/2018 at 9:27 am to Dr. Laurence Slate , who verbally acknowledged these results. Electronically Signed   By: Marlan Palau M.D.   On: 12/06/2018 09:27        Scheduled Meds:  sodium chloride   Intravenous Once   acetaminophen  650 mg Oral Q6H   aspirin EC  325 mg Oral Q breakfast   celecoxib  200 mg Oral BID   clopidogrel  75 mg Oral Daily   docusate sodium  100 mg Oral BID   feeding supplement (ENSURE ENLIVE)  237 mL Oral BID BM   ferrous sulfate  325 mg Oral Q breakfast   insulin aspart  0-5 Units Subcutaneous QHS   insulin aspart  0-9 Units Subcutaneous TID WC   levothyroxine  50 mcg Oral QAC breakfast   linagliptin  5 mg Oral Daily   mirtazapine  30 mg Oral QHS   multivitamin with minerals  1 tablet Oral Daily   pantoprazole  40 mg Oral Daily   polyethylene glycol  17 g Oral Daily   propranolol  40 mg Oral BID   thiamine injection  100 mg Intravenous Daily   Continuous Infusions:    LOS: 3 days    Time spent: over 30 min    Lacretia Nicks, MD [facility] Pager AMION  If 7PM-7AM, please contact night-coverage [email or web address] Password [facility] 12/07/2018, 3:41 PM

## 2018-12-07 NOTE — Progress Notes (Signed)
Chaplain responded to spiritual consult.  Patient tearful. Patient only partially oriented. Difficult to carry out visit.  Doctor visited while chaplain was bedside and remarked that he was aware she was missing her eyeglasses. Doctor called the facility she came from with a request to locate them in pocket of green sweater.  Will continue to be available.Lynnell Chad Pager [phone]

## 2018-12-07 NOTE — Plan of Care (Signed)

## 2018-12-08 DIAGNOSIS — F0281 Dementia in other diseases classified elsewhere with behavioral disturbance: Secondary | ICD-10-CM | POA: Diagnosis not present

## 2018-12-08 DIAGNOSIS — R1010 Upper abdominal pain, unspecified: Secondary | ICD-10-CM | POA: Diagnosis not present

## 2018-12-08 DIAGNOSIS — R41841 Cognitive communication deficit: Secondary | ICD-10-CM | POA: Diagnosis not present

## 2018-12-08 DIAGNOSIS — E119 Type 2 diabetes mellitus without complications: Secondary | ICD-10-CM | POA: Diagnosis not present

## 2018-12-08 DIAGNOSIS — G309 Alzheimer's disease, unspecified: Secondary | ICD-10-CM | POA: Diagnosis not present

## 2018-12-08 DIAGNOSIS — Z794 Long term (current) use of insulin: Secondary | ICD-10-CM | POA: Diagnosis not present

## 2018-12-08 DIAGNOSIS — E039 Hypothyroidism, unspecified: Secondary | ICD-10-CM | POA: Diagnosis not present

## 2018-12-08 DIAGNOSIS — Z9181 History of falling: Secondary | ICD-10-CM | POA: Diagnosis not present

## 2018-12-08 DIAGNOSIS — M6281 Muscle weakness (generalized): Secondary | ICD-10-CM | POA: Diagnosis not present

## 2018-12-08 DIAGNOSIS — R110 Nausea: Secondary | ICD-10-CM | POA: Diagnosis not present

## 2018-12-08 DIAGNOSIS — R0602 Shortness of breath: Secondary | ICD-10-CM | POA: Diagnosis not present

## 2018-12-08 DIAGNOSIS — R410 Disorientation, unspecified: Secondary | ICD-10-CM | POA: Diagnosis not present

## 2018-12-08 DIAGNOSIS — R0689 Other abnormalities of breathing: Secondary | ICD-10-CM | POA: Diagnosis not present

## 2018-12-08 DIAGNOSIS — Z7902 Long term (current) use of antithrombotics/antiplatelets: Secondary | ICD-10-CM | POA: Diagnosis not present

## 2018-12-08 DIAGNOSIS — I4891 Unspecified atrial fibrillation: Secondary | ICD-10-CM | POA: Diagnosis not present

## 2018-12-08 DIAGNOSIS — R404 Transient alteration of awareness: Secondary | ICD-10-CM | POA: Diagnosis not present

## 2018-12-08 DIAGNOSIS — I10 Essential (primary) hypertension: Secondary | ICD-10-CM | POA: Diagnosis not present

## 2018-12-08 DIAGNOSIS — M25552 Pain in left hip: Secondary | ICD-10-CM | POA: Diagnosis not present

## 2018-12-08 DIAGNOSIS — R1312 Dysphagia, oropharyngeal phase: Secondary | ICD-10-CM | POA: Diagnosis not present

## 2018-12-08 DIAGNOSIS — Z7982 Long term (current) use of aspirin: Secondary | ICD-10-CM | POA: Diagnosis not present

## 2018-12-08 DIAGNOSIS — R1013 Epigastric pain: Secondary | ICD-10-CM | POA: Diagnosis not present

## 2018-12-08 DIAGNOSIS — F0280 Dementia in other diseases classified elsewhere without behavioral disturbance: Secondary | ICD-10-CM | POA: Diagnosis not present

## 2018-12-08 DIAGNOSIS — F339 Major depressive disorder, recurrent, unspecified: Secondary | ICD-10-CM | POA: Diagnosis not present

## 2018-12-08 DIAGNOSIS — M2550 Pain in unspecified joint: Secondary | ICD-10-CM | POA: Diagnosis not present

## 2018-12-08 DIAGNOSIS — I447 Left bundle-branch block, unspecified: Secondary | ICD-10-CM | POA: Diagnosis not present

## 2018-12-08 DIAGNOSIS — S72142D Displaced intertrochanteric fracture of left femur, subsequent encounter for closed fracture with routine healing: Secondary | ICD-10-CM | POA: Diagnosis not present

## 2018-12-08 DIAGNOSIS — F17200 Nicotine dependence, unspecified, uncomplicated: Secondary | ICD-10-CM | POA: Diagnosis not present

## 2018-12-08 DIAGNOSIS — S72142A Displaced intertrochanteric fracture of left femur, initial encounter for closed fracture: Secondary | ICD-10-CM | POA: Diagnosis not present

## 2018-12-08 DIAGNOSIS — Z7401 Bed confinement status: Secondary | ICD-10-CM | POA: Diagnosis not present

## 2018-12-08 DIAGNOSIS — R2689 Other abnormalities of gait and mobility: Secondary | ICD-10-CM | POA: Diagnosis not present

## 2018-12-08 DIAGNOSIS — I2510 Atherosclerotic heart disease of native coronary artery without angina pectoris: Secondary | ICD-10-CM | POA: Diagnosis not present

## 2018-12-08 DIAGNOSIS — Z79899 Other long term (current) drug therapy: Secondary | ICD-10-CM | POA: Diagnosis not present

## 2018-12-08 DIAGNOSIS — R079 Chest pain, unspecified: Secondary | ICD-10-CM | POA: Diagnosis not present

## 2018-12-08 LAB — COMPREHENSIVE METABOLIC PANEL
ALT: 14 U/L (ref 0–44)
AST: 21 U/L (ref 15–41)
Albumin: 2.2 g/dL — ABNORMAL LOW (ref 3.5–5.0)
Alkaline Phosphatase: 61 U/L (ref 38–126)
Anion gap: 9 (ref 5–15)
BUN: 23 mg/dL (ref 8–23)
CO2: 23 mmol/L (ref 22–32)
Calcium: 7.9 mg/dL — ABNORMAL LOW (ref 8.9–10.3)
Chloride: 111 mmol/L (ref 98–111)
Creatinine, Ser: 0.72 mg/dL (ref 0.44–1.00)
GFR calc Af Amer: >60 mL/min (ref >60)
GFR calc non Af Amer: >60 mL/min (ref >60)
Glucose, Bld: 204 mg/dL — ABNORMAL HIGH (ref 70–99)
Potassium: 3.6 mmol/L (ref 3.5–5.1)
Sodium: 143 mmol/L (ref 135–145)
Total Bilirubin: 0.7 mg/dL (ref 0.3–1.2)
Total Protein: 4.4 g/dL — ABNORMAL LOW (ref 6.5–8.1)

## 2018-12-08 LAB — GLUCOSE, CAPILLARY
Glucose-Capillary: 165 mg/dL — ABNORMAL HIGH (ref 70–99)
Glucose-Capillary: 189 mg/dL — ABNORMAL HIGH (ref 70–99)

## 2018-12-08 LAB — HEMOGLOBIN AND HEMATOCRIT, BLOOD
HCT: 22.9 % — ABNORMAL LOW (ref 36.0–46.0)
Hemoglobin: 7.3 g/dL — ABNORMAL LOW (ref 12.0–15.0)

## 2018-12-08 LAB — CBC
HCT: 22 % — ABNORMAL LOW (ref 36.0–46.0)
Hemoglobin: 7.2 g/dL — ABNORMAL LOW (ref 12.0–15.0)
MCH: 28.3 pg (ref 26.0–34.0)
MCHC: 32.7 g/dL (ref 30.0–36.0)
MCV: 86.6 fL (ref 80.0–100.0)
Platelets: 126 10*3/uL — ABNORMAL LOW (ref 150–400)
RBC: 2.54 MIL/uL — ABNORMAL LOW (ref 3.87–5.11)
RDW: 15.1 % (ref 11.5–15.5)
WBC: 6.6 10*3/uL (ref 4.0–10.5)
nRBC: 0.6 % — ABNORMAL HIGH (ref 0.0–0.2)

## 2018-12-08 LAB — MAGNESIUM: Magnesium: 1.9 mg/dL (ref 1.7–2.4)

## 2018-12-08 MED ORDER — ASPIRIN 325 MG PO TBEC: 325.0000 mg | DELAYED_RELEASE_TABLET | Freq: Every day | ORAL | 0 refills | Status: DC

## 2018-12-08 MED ORDER — INSULIN ASPART 100 UNIT/ML ~~LOC~~ SOLN: 0.0000 [IU] | Freq: Three times a day (TID) | SUBCUTANEOUS | 11 refills | Status: DC

## 2018-12-08 MED ORDER — ASPIRIN EC 81 MG PO TBEC: 81.0000 mg | DELAYED_RELEASE_TABLET | Freq: Every day | ORAL | 1 refills | Status: DC

## 2018-12-08 MED ORDER — VITAMIN B-1 100 MG PO TABS: 100.0000 mg | ORAL_TABLET | Freq: Every day | ORAL | Status: DC

## 2018-12-08 MED ORDER — TRAMADOL HCL 50 MG PO TABS: 50.0000 mg | ORAL_TABLET | Freq: Four times a day (QID) | ORAL | 0 refills | Status: DC | PRN

## 2018-12-08 MED ORDER — ENSURE ENLIVE PO LIQD: 237.0000 mL | Freq: Three times a day (TID) | ORAL | Status: DC

## 2018-12-08 NOTE — Progress Notes (Signed)
PHARMACIST - PHYSICIAN COMMUNICATION  DR:   Lowell Guitar CONCERNING: IV to Oral Route Change Policy  RECOMMENDATION: This patient is receiving thiamine by the intravenous route.  Based on criteria approved by the Pharmacy and Therapeutics Committee, intravenous thiamine  is being converted to the equivalent oral dose form(s).  DESCRIPTION: These criteria include:  The patient has tolerated at least one dose of an oral or enteral medication  The patient has no evidence of active gastrointestinal bleeding or impaired GI absorption (gastrectomy, short bowel, patient on TNA or NPO).  The patient is not undergoing procedural sedation   If you have questions about this conversion, please contact the Pharmacy Department  [x]   [phone] )  Kristin Grant

## 2018-12-08 NOTE — TOC Transition Note (Signed)
Transition of Care [facility]) - CM/SW Discharge Note   Patient Details  Name: Kristin Grant MRN: [id number] Date of Birth: [birth year]  Transition of Care [facility]) CM/SW Contact:  Gildardo Griffes, LCSW Phone Number: 12/08/2018, 2:42 PM   Clinical Narrative:    Patient will DC to: [facility] Anticipated DC date: 12/08/2018 Family notified: Richard Transport [id number]  Per MD patient ready for DC to [facility] . RN, patient, patient's family, and facility notified of DC. Discharge Summary sent to facility. RN given number for report [phone]. DC packet on chart. Ambulance transport requested for patient.  CSW signing off.  [place], [place] [phone]    Final next level of care: Skilled Nursing Facility Barriers to Discharge: No Barriers Identified   Patient Goals and CMS Choice Patient states their goals for this hospitalization and ongoing recovery are:: to go to rehab then home CMS Medicare.gov Compare Post Acute Care list provided to:: Patient Represenative (must comment)(Richard (POA)) Choice offered to / list presented to : [facility] POA / Guardian(Richard)  Discharge Placement PASRR number recieved: 12/07/18            Patient chooses bed at: Other - please specify in the comment section below:([facility]) Patient to be transferred to facility by: PTAR Name of family member notified: Richard Patient and family notified of of transfer: 12/08/18  Discharge Plan and Services     Post Acute Care Choice: Skilled Nursing Facility          DME Arranged: N/A DME Agency: NA HH Arranged: NA HH Agency: NA   Social Determinants of Health (SDOH) Interventions     Readmission Risk Interventions No flowsheet data found.

## 2018-12-08 NOTE — Progress Notes (Signed)
Nutrition Follow-up  RD working remotely.  DOCUMENTATION CODES:   Not applicable  INTERVENTION:   -Continue MVI with minerals daily -Increase Ensure Enlive po to TID, each supplement provides 350 kcal and 20 grams of protein -Feeding assistance with meals -Pt with no BM since admission; recommend continue with bowel regimen  NUTRITION DIAGNOSIS:   Increased nutrient needs related to post-op healing as evidenced by estimated needs.  Ongoing  GOAL:   Patient will meet greater than or equal to 90% of their needs  Progressing  MONITOR:   PO intake, Supplement acceptance, Diet advancement, Weight trends, Labs  REASON FOR ASSESSMENT:   Malnutrition Screening Tool    ASSESSMENT:   78 yo female, admitted with hip fracture. Plan for hardware fixation to L hip. PMH Alzheimer disease, CAD, DM, HTN, reflux, h/o stroke.  4/10- s/p- Procedure: Left trochanteric nail with interlock for comminuted closed intertrochanteric hip fracture. 4/12- rapid response called due to pt lethargic and administering pain meds; neuro consulted- no stroke; s/p BSE- recommend NPO 4/13- s/p BSE- advanced to dysphagia 3 diet with thin liquids  Reviewed I/O's: +140 ml x 24 hours and +2.1 L since admission  UOP: 250 ml x 24 hours  Pt unable to provide further history secondary to dementia. Unable to obtain further nutrition-related history at this time.   Pt with variable intake related to mental status. She consumed 80% of breakfast this morning. Per MAR, she is accepting Ensure supplements.   Pt with no documented BM since admission. Reviewed medications and include docusate sodium and polyethylene glycol. Pt also on remeron. Suspect limited po intake also contributing to lack of BM.   Pt with poor oral intake and would benefit from nutrient dense supplement. One Ensure Enlive supplement provides 350 kcals, 20 grams protein, and 44-45 grams of carbohydrate vs one Glucerna shake supplement, which  provides 220 kcals, 10 grams of protein, and 26 grams of carbohydrate. Given pt's hx of DM, RD will continue to monitor PO intake, CBGS, and adjust supplement regimen as appropriate.   Labs reviewed: CBGS: 139-269 (inpatient orders for glycemic control are 0-5 units insulin aspart q HS, 0-9 units insulin aspart TID with meals, and 5 mg linagliptin daily).   Diet Order:   Diet Order            DIET DYS 3 Room service appropriate? Yes with Assist; Fluid consistency: Thin  Diet effective now              EDUCATION NEEDS:   No education needs have been identified at this time  Skin:  Skin Assessment: Skin Integrity Issues: Skin Integrity Issues:: Incisions Incisions: closed lt hip Other: Ecchymosis - arm, abdomen  Last BM:  PTA  Height:   Ht Readings from Last 1 Encounters:  12/04/18 5\' 4"  (1.626 m)    Weight:   Wt Readings from Last 1 Encounters:  12/08/18 61.7 kg    Ideal Body Weight:  54.5 kg  BMI:  Body mass index is 23.35 kg/m.  Estimated Nutritional Needs:   Kcal:  1500-1700  Protein:  60-75 grams  Fluid:  > 1.5 L    Mikah Poss A. Mayford Knife, RD, LDN, CDCES Registered Dietitian II Certified Diabetes Care and Education Specialist Pager: [phone] After hours Pager: [phone]

## 2018-12-08 NOTE — Discharge Summary (Addendum)
Physician Discharge Summary  Kristin Grant IFB:[id number] DOB: 31-May-1941 DOA: 12/04/2018  PCP: Kristin Scarlet, MD  Admit date: 12/04/2018 Discharge date: 12/08/2018  Time spent: 40 minutes  Recommendations for Outpatient Follow-up:  1. Follow up outpatient CBC/CMP 2. Pt's blood sugars here reasonable off of home lantus and mealtime insulin.  Continue to hold lantus/mealtime insulin at discharge.  Continue ACHS blood sugars and sliding scale insulin.  Adjust insulin as needed. 3. Follow up hemoglobin within 3 days as outpatient  4. Follow up with speech therapy as an outpatient 5. Follow final cx    Discharge Diagnoses:  Principal Problem:   Intertrochanteric fracture of left hip (HCC) Active Problems:   Dementia (HCC)   Hypertension   Hypothyroidism   Diabetes mellitus, type II (HCC)   History of CVA (cerebrovascular accident)   Intertrochanteric fracture of left hip, closed, initial encounter [facility])   Discharge Condition: stable  Diet recommendation: dysphagia 3 diet (see below)  Filed Weights   12/06/18 0500 12/07/18 0422 12/08/18 0500  Weight: 64.4 kg 60.9 kg 61.7 kg    History of present illness:  Per HPI ElseneGoinsis a77 y.o.female, with hypertension, dm2, hypothyroidism, h/o CVA, dementia, apparently fell at assisted living. Its unclear how she fell. Pt states that she fell while getting out of bed to go to the bathroom, but she has dementia.  Pt denies any cp, palp, sob, n/v, diarrhea, brbpr, black stool, dysuria, but once again history is limited by her dementia, She is axox1 (person), not place or time.   Pt was apparently Kristin Grant full code while at SNF. I attempted to contact her son, but got voicemail. Left message that she will be admitted to [facility].   In ED,  T 98.8, afebrile, Pt 65, Bp 119/59, pox 100% on RA  Xray: IMPRESSION: 1. Acute comminuted intertrochanteric fracture of the left hip. 2. Cortical irregularity through the left  superior and inferior pubic rami, suspicious for associated pelvic fractures.   Wbc 11.9, hgb 13.0, Plt 157 Na 141, K 3.6 Bun 17, Creatinine 0.73 Glucose 144  ED spoke with orthopedics and they requested admission to [facility] for L hip intertrochanteric fracture with possible left superior and inferior pubic rami fracture.  She was admitted after Kristin Grant fall and had Kristin Grant L hip fracture.  She's now s/p IM nail with orthopedics.  Post op course was c/b acute encephalopathy.  This has improved.  She remains on [name] dysphagia 3 diet and will need speech follow up.   See below for additional details  Hospital Course:  Acute Encephalopathy: Abrupt change in mental status 4/12.  Suspect toxic/metabolic related to meds/hospitalization.  Code stroke was called given abrupt change in mental status. She's gradually improved over past day Head CT negative for stroke Seen by neurology, code stroke canceled. Hold off on MRI unless pt does not continue to improve Continue delirium precautions Seen by speech and currently on dysphagia 3 diet, continue speech follow up as outpatient  Left intertrochanteric hip fracture  Possible Left Superior and Inferior Pubic Rami Fx Appreciate ortho assistance  S/p L IM nail on 4/10 PT/OT today As pt also on plavix, planning for ASA 81 mg daily for DVT ppx (pt was already on DAPT, discussed dosing with ortho) APAP, tramadol prn pain  Post Operative Blood Loss Anemia: Stable H/H today.  Pt was on DAPT at time of surgery.  Transfuse for <7 or symptomatic. S/p 1 unit pRBC 4/11 Hb stable today ~7, follow up outpatient  Dm2 Tradjenta resumed post op  Continue ACHS blood sugar checks and SSI Hold on resuming home lantus 14 units or 6 units of aspart at mealtimes as BG reasonable here  Hypertension Cont Propranolol  po bid Resume lisinopril  H/o CVA Cont aspirin, Cont plavix  Hypothyroidism Cont Levothyroxine 50 micrograms po qday  Dementia Cont  Remeron  po qhs  Gerd Cont Protonix  po qday  Thrombocytopenia:  Follow   Procedures: S/p L trochanteric nail with interlock for comminuted closed intertrochanteric hip fracture 4/14  Consultations:  Orthopedics  neurology  Discharge Exam: Vitals:   12/08/18 1100 12/08/18 1204  BP: (!) 140/95 (!) 121/47  Pulse: 79 85  Resp:    Temp: (!) 97.4 F (36.3 C) 98.7 F (37.1 C)  SpO2: 100% 98%   Feeling well Kristin Grant&Ox3 this AM. No complaints of pain.  General: No acute distress. Cardiovascular: Heart sounds show Kristin Grant regular rate, and rhythm.  Lungs: Clear to auscultation bilaterally  Abdomen: Soft, nontender, nondistended  Neurological: Alert and oriented 3. Moves all extremities 4 . Cranial nerves II through XII grossly intact. Skin: Warm and dry. No rashes or lesions. Extremities: LLE with dressing intact  Discharge Instructions   Discharge Instructions    Call MD for:  difficulty breathing, headache or visual disturbances   Complete by:  As directed    Call MD for:  extreme fatigue   Complete by:  As directed    Call MD for:  hives   Complete by:  As directed    Call MD for:  persistant dizziness or light-headedness   Complete by:  As directed    Call MD for:  persistant nausea and vomiting   Complete by:  As directed    Call MD for:  redness, tenderness, or signs of infection (pain, swelling, redness, odor or green/yellow discharge around incision site)   Complete by:  As directed    Call MD for:  severe uncontrolled pain   Complete by:  As directed    Call MD for:  temperature >100.4   Complete by:  As directed    Diet Carb Modified   Complete by:  As directed    Discharge diet:   Complete by:  As directed    Diet recommendations: Dysphagia 3 (mechanical soft);Thin liquid Liquids provided via: Cup;Straw Medication Administration: Crushed with puree(whole if small) Supervision: Patient able to self feed;Full supervision/cueing for compensatory  strategies Compensations: Minimize environmental distractions;Slow rate;Small sips/bites;Lingual sweep for clearance of pocketing(check for oral pocketing after meals, instruct pt to clear (right more than left)) Postural Changes and/or Swallow Maneuvers: Seated upright 90 degrees            Oral Care Recommendations: Oral care BID Follow up Recommendations: Skilled Nursing facility SLP Visit Diagnosis: Dysphagia, unspecified (R13.10) Plan: Continue with current plan of care    Discharge instructions   Complete by:  As directed    You were seen for Katelind Pytel hip fracture.  This was repaired surgically by orthopedics.  [facility] are [facility].  You'll need follow up labs as an outpatient and to follow up with orthopedics and your PCP.  We adjusted your diet.  You'll need to follow up with speech as an outpatient.  We stopped your lantus and mealtime insulin.  Continue to follow blood sugars at the skilled nursing facility.  They will adjust your insulin as needed.   Return for new, recurrent, and worsening symptoms.  Please ask your  PCP to request records from this hospitalization so they know what was done and what the next steps will be.   Increase activity slowly   Complete by:  As directed      Allergies as of 12/08/2018   No Known Allergies     Medication List    STOP taking these medications   acetaminophen 325 MG tablet Commonly known as:  TYLENOL   aspirin 81 MG chewable tablet Replaced by:  aspirin EC 81 MG tablet   Lantus 100 UNIT/ML injection Generic drug:  insulin glargine   NovoLOG FlexPen 100 UNIT/ML FlexPen Generic drug:  insulin aspart Replaced by:  insulin aspart 100 UNIT/ML injection     TAKE these medications   albuterol 108 (90 Base) MCG/ACT inhaler Commonly known as:  PROVENTIL HFA;VENTOLIN HFA Inhale 2 puffs into the lungs every 6 (six) hours as needed for wheezing or shortness of breath.    aspirin EC 81 MG tablet Take 1 tablet (81 mg total) by mouth daily. Replaces:  aspirin 81 MG chewable tablet   clopidogrel 75 MG tablet Commonly known as:  PLAVIX TAKE 1 TABLET (75mg ) BY MOUTH ONCE DAILY.   Daily Vite Tabs TAKE 1 TABLET BY MOUTH ONCE DAILY.   docusate sodium 100 MG capsule Commonly known as:  COLACE Take 1 capsule (100 mg total) by mouth 2 (two) times daily.   insulin aspart 100 UNIT/ML injection Commonly known as:  novoLOG Inject 0-9 Units into the skin 3 (three) times daily with meals. Correction coverage: Sensitive (thin, NPO, renal) CBG < 70: implement hypoglycemia protocol CBG 70 - 120: 0 units CBG 121 - 150: 1 unit CBG 151 - 200: 2 units CBG 201 - 250: 3 units CBG 251 - 300: 5 units CBG 301 - 350: 7 units CBG 351 - 400: 9 units CBG > 400: call MD and obtain STAT lab verification, Do NOT hold if patient is NPO. Sensitive Scale. Replaces:  NovoLOG FlexPen 100 UNIT/ML FlexPen   Iron 325 (65 Fe) MG Tabs TAKE 1 TABLET BY MOUTH ONCE DAILY. What changed:  how much to take   levothyroxine 50 MCG tablet Commonly known as:  SYNTHROID, LEVOTHROID TAKE 1 TABLET BY MOUTH ONCE DAILY. What changed:  when to take this   lisinopril 40 MG tablet Commonly known as:  PRINIVIL,ZESTRIL TAKE 1 TABLET BY MOUTH ONCE DAILY.   mirtazapine 30 MG tablet Commonly known as:  REMERON Take 1 tablet (30 mg total) by mouth at bedtime.   NovoFine Autocover 30G X 8 MM Misc Generic drug:  Insulin Pen Needle USE AS DIRECTED FOR INSULIN ADMINISTRATION 3 TIMES DAILY.   pantoprazole 40 MG tablet Commonly known as:  PROTONIX TAKE 1 TABLET BY MOUTH ONCE DAILY.   propranolol 40 MG tablet Commonly known as:  INDERAL TAKE 1 TABLET BY MOUTH TWICE DAILY.   Tradjenta 5 MG Tabs tablet Generic drug:  linagliptin TAKE 1 TABLET BY MOUTH ONCE DAILY. What changed:  how much to take   traMADol 50 MG tablet Commonly known as:  ULTRAM Take 1 tablet (50 mg total) by mouth every 6 (six)  hours as needed for moderate pain.   Tussin Cough DM 10-100 MG/5ML liquid Generic drug:  Dextromethorphan-guaiFENesin Take 5 mLs by mouth every 6 (six) hours as needed (cough).      No Known Allergies  Contact information for follow-up providers    Eldred Manges, MD. Schedule an appointment as soon as possible for Caralynn Gelber visit today.   Specialty:  Orthopedic Surgery Why:  needs return office visit 2 weeks postop.  Contact information: [address] [place] [address] [phone]        Kristin Scarlet, MD Follow up.   Specialty:  Family Medicine Contact information: [address] [place] [address] [phone]            Contact information for after-discharge care    Destination    [facility] Preferred SNF .   Service:  Skilled Nursing Contact information: [address] [place] [address] [phone]                   The results of significant diagnostics from this hospitalization (including imaging, microbiology, ancillary and laboratory) are listed below for reference.    Significant Diagnostic Studies: Dg Chest 1 View  Result Date: 12/04/2018 CLINICAL DATA:  Initial evaluation for acute trauma, hip fracture. EXAM: CHEST  1 VIEW COMPARISON:  Prior radiograph from 08/03/2018 FINDINGS: Transverse heart size within normal limits. Mediastinal silhouette normal. Lungs hypoinflated. Associated mild left basilar subsegmental atelectasis. No focal infiltrates. No edema or effusion. No pneumothorax. Chronic deformity about the bilateral humeral heads, could be degenerative and/or related to remote trauma. No acute osseous abnormality. Osteopenia. IMPRESSION: 1. Shallow lung inflation with mild left basilar subsegmental atelectasis. 2. No other active cardiopulmonary disease. Electronically Signed   By: Rise Mu M.D.   On: 12/04/2018 01:45   Dg Chest Port 1  View  Result Date: 12/05/2018 CLINICAL DATA:  Fever. EXAM: PORTABLE CHEST 1 VIEW COMPARISON:  Radiograph of December 04, 2018. FINDINGS: The heart size and mediastinal contours are within normal limits. No pneumothorax or pleural effusion is noted. Right lung is clear. Elevated left hemidiaphragm is noted with minimal left basilar subsegmental atelectasis. The visualized skeletal structures are unremarkable. IMPRESSION: Minimal left basilar subsegmental atelectasis. Electronically Signed   By: Lupita Raider, M.D.   On: 12/05/2018 21:19   Dg C-arm 1-60 Min  Result Date: 12/04/2018 CLINICAL DATA:  Intraoperative imaging for fixation of Keylon Labelle left intertrochanteric fracture the patient suffered in Mikenzi Raysor fall earlier today. Initial encounter. EXAM: DG C-ARM 61-120 MIN; OPERATIVE LEFT HIP WITH PELVIS COMPARISON:  Plain films left hip earlier today. FINDINGS: Six fluoroscopic intraoperative spot views are provided and demonstrate placement of Markasia Carrol hip screw and short intramedullary nail with Waco Foerster single distal screw for fixation of an intertrochanteric fracture. No acute abnormality. IMPRESSION: Intraoperative imaging for fixation of Jorgen Wolfinger left intertrochanteric fracture. Electronically Signed   By: Drusilla Kanner M.D.   On: 12/04/2018 16:04   Dg Hip Operative Unilat W Or W/o Pelvis Left  Result Date: 12/04/2018 CLINICAL DATA:  Intraoperative imaging for fixation of Verenise Moulin left intertrochanteric fracture the patient suffered in Tramayne Sebesta fall earlier today. Initial encounter. EXAM: DG C-ARM 61-120 MIN; OPERATIVE LEFT HIP WITH PELVIS COMPARISON:  Plain films left hip earlier today. FINDINGS: Six fluoroscopic intraoperative spot views are provided and demonstrate placement of Bliss Tsang hip screw and short intramedullary nail with Maikol Grassia single distal screw for fixation of an intertrochanteric fracture. No acute abnormality. IMPRESSION: Intraoperative imaging for fixation of Sharad Vaneaton left intertrochanteric fracture. Electronically Signed   By: Drusilla Kanner  M.D.   On: 12/04/2018 16:04   Dg Hip Unilat W Or Wo Pelvis 2-3 Views Left  Result Date: 12/04/2018 CLINICAL DATA:  Initial evaluation for possible hip fracture. EXAM: DG HIP (WITH OR WITHOUT PELVIS) 2-3V LEFT COMPARISON:  None. FINDINGS: Acute  comminuted intertrochanteric fracture of the left hip with mild displacement. Femoral head remains normally position within the acetabulum. Cortical irregularity through the left superior and inferior pubic rami suspicious for associated fractures. No pubic diastasis. SI joints approximated. Fixation rod in place within the right hip/femur. Diffuse osteopenia. No acute soft tissue abnormality. Vascular calcifications noted. IMPRESSION: 1. Acute comminuted intertrochanteric fracture of the left hip. 2. Cortical irregularity through the left superior and inferior pubic rami, suspicious for associated pelvic fractures. Electronically Signed   By: Rise Mu M.D.   On: 12/04/2018 01:43   Ct Head Code Stroke Wo Contrast  Result Date: 12/06/2018 CLINICAL DATA:  Code stroke. Acute mental status change. Hip surgery 2 days ago. EXAM: CT HEAD WITHOUT CONTRAST TECHNIQUE: Contiguous axial images were obtained from the base of the skull through the vertex without intravenous contrast. COMPARISON:  MRI head 11/16/2017 FINDINGS: Brain: Moderate atrophy. Moderate chronic microvascular ischemic changes in the white matter. Small chronic infarct in the subinsular white matter on the left. Negative for acute cortical infarct. Negative for hemorrhage or mass. Vascular: Extensive vascular calcification especially in the distal vertebral arteries bilaterally. Negative for hyperdense vessel Skull: Negative Sinuses/Orbits: Mucosal edema paranasal sinuses specially the right maxillary sinus. Negative orbit. Other: 2.5 cm right parietal scalp mass with mixed fatty and soft tissue density is chronic and unchanged from prior studies. ASPECTS [facility] Stroke Program Early CT Score) -  Ganglionic level infarction (caudate, lentiform nuclei, internal capsule, insula, M1-M3 cortex): 7 - Supraganglionic infarction (M4-M6 cortex): 3 Total score (0-10 with 10 being normal): 10 IMPRESSION: 1. No acute intracranial abnormality 2. Moderate atrophy and moderate chronic microvascular ischemic change 3. ASPECTS is 10 4. These results were called by telephone at the time of interpretation on 12/06/2018 at 9:27 am to Dr. Laurence Slate , who verbally acknowledged these results. Electronically Signed   By: Marlan Palau M.D.   On: 12/06/2018 09:27    Microbiology: Recent Results (from the past 240 hour(s))  Surgical pcr screen     Status: None   Collection Time: 12/04/18  4:52 AM  Result Value Ref Range Status   MRSA, PCR NEGATIVE NEGATIVE Final   Staphylococcus aureus NEGATIVE NEGATIVE Final    Comment: (NOTE) The Xpert SA Assay (FDA approved for NASAL specimens in patients 78 years of age and older), is one component of Anden Bartolo comprehensive surveillance program. It is not intended to diagnose infection nor to guide or monitor treatment. Performed at [facility], [address] [place], [place] [address]   Culture, blood (routine x 2)     Status: None (Preliminary result)   Collection Time: 12/05/18 10:11 PM  Result Value Ref Range Status   Specimen Description BLOOD RIGHT HAND  Final   Special Requests   Final    BOTTLES DRAWN AEROBIC ONLY Blood Culture adequate volume   Culture   Final    NO GROWTH 3 DAYS Performed at [facility], [address], [place], [place] [address]    Report Status PENDING  Incomplete  Culture, blood (routine x 2)     Status: None (Preliminary result)   Collection Time: 12/05/18 11:11 PM  Result Value Ref Range Status   Specimen Description BLOOD RIGHT HAND  Final   Special Requests   Final    BOTTLES DRAWN AEROBIC ONLY Blood Culture results may not be optimal due to an inadequate volume of blood received in culture bottles   Culture   Final    NO  GROWTH 3 DAYS Performed at [facility], [address] [place], [place] [address]    Report Status PENDING  Incomplete  Culture, Urine     Status: None   Collection Time: 12/06/18  8:30 AM  Result Value Ref Range Status   Specimen Description URINE, RANDOM  Final   Special Requests NONE  Final   Culture   Final    NO GROWTH Performed at [facility], [address] [place], [place] [address]    Report Status 12/07/2018 FINAL  Final     Labs: Basic Metabolic Panel: Recent Labs  Lab 12/04/18 0039 12/05/18 0359 12/06/18 0245 12/07/18 0906 12/08/18 0316  NA 141 141 141 141 143  K 3.6 4.1 3.8 3.5 3.6  CL 106 107 109 110 111  CO2 27 25 23 23 23   GLUCOSE 144* 174* 194* 230* 204*  BUN 17 17 29* 20 23  CREATININE 0.73 0.79 1.03* 0.76 0.72  CALCIUM 8.2* 7.5* 7.3* 7.7* 7.9*  MG  --   --   --  2.0 1.9   Liver Function Tests: Recent Labs  Lab 12/04/18 0039 12/05/18 0359 12/08/18 0316  AST 19 20 21   ALT 20 15 14   ALKPHOS 68 43 61  BILITOT 0.4 0.5 0.7  PROT 6.2* 4.4* 4.4*  ALBUMIN 3.6 2.6* 2.2*   No results for input(s): LIPASE, AMYLASE in the last 168 hours. No results for input(s): AMMONIA in the last 168 hours. CBC: Recent Labs  Lab 12/04/18 0039 12/05/18 0359  12/05/18 2255 12/06/18 0245 12/07/18 0906 12/08/18 0316 12/08/18 1258  WBC 11.9* 14.9*  --   --  8.9 8.7 6.6  --   NEUTROABS 8.5*  --   --   --   --   --   --   --   HGB 13.0 7.6*   < > 7.4* 7.5* 8.4* 7.2* 7.3*  HCT 41.6 24.4*   < > 22.9* 23.5* 25.7* 22.0* 22.9*  MCV 86.7 84.7  --   --  86.4 86.8 86.6  --   PLT 157 122*  --   --  102* 128* 126*  --    < > = values in this interval not displayed.   Cardiac Enzymes: Recent Labs  Lab 12/04/18 0039  CKTOTAL 46   BNP: BNP (last 3 results) No results for input(s): BNP in the last 8760 hours.  ProBNP (last 3 results) No results for input(s): PROBNP in the last 8760 hours.  CBG: Recent Labs  Lab 12/07/18 1237 12/07/18 1743  12/07/18 2058 12/08/18 0747 12/08/18 1226  GLUCAP 269* 175* 139* 165* 189*       Signed:  Lacretia Nicksaldwell Powell MD.  [facility] 12/08/2018, 1:55 PM

## 2018-12-08 NOTE — Progress Notes (Signed)
  Speech Language Pathology Treatment: Dysphagia  Patient Details Name: Kristin Grant MRN: [id number] DOB: Jun 16, 1941 Today's Date: 12/08/2018 Time: 3335-4562 SLP Time Calculation (min) (ACUTE ONLY): 31 min  Assessment / Plan / Recommendation Clinical Impression  Pt today awake in bed, able to feed herself.  She denies dysphagia and repeatedly stated "I"m fine".  Dysarthria noted as well as mild oral pocketing of boluses on the right.  Subtle dry cough noted during intake= but not coorelated to po.  SLP will follow up given pt's dementia - and her CXR concerning for left lobe ATX.  Pt with WBC WFL at this time.   HPI HPI: Pt is a 78 y.o. F with significant PMH of hypertension, CVA, dementia who presents with fall from assisted living. Found to have acute comminuted intertrochanteric fracture of the left hip. Possible left superior and inferior pubic rami fx. Now s/p left intertrochanteric IM nail. Code stroke called on 4/12 with AMS but CT scan negative for acute abnormality. Per neuro, presentation less likely stroke vs medication induced mental status change.  Pt underwent swallow evaluation and has been on a dys3/thin diet.  Follow up indicated to assure tolerance and determine indication for instrumental evaluation.        SLP Plan  Continue with current plan of care       Recommendations  Diet recommendations: Dysphagia 3 (mechanical soft);Thin liquid Liquids provided via: Cup;Straw Medication Administration: Crushed with puree(whole if small) Supervision: Patient able to self feed;Full supervision/cueing for compensatory strategies Compensations: Minimize environmental distractions;Slow rate;Small sips/bites;Lingual sweep for clearance of pocketing(check for oral pocketing after meals, instruct pt to clear (right more than left)) Postural Changes and/or Swallow Maneuvers: Seated upright 90 degrees                Oral Care Recommendations: Oral care BID Follow up Recommendations:  Skilled Nursing facility SLP Visit Diagnosis: Dysphagia, unspecified (R13.10) Plan: Continue with current plan of care       GO                Chales Abrahams 12/08/2018, 8:50 AM   Donavan Burnet, MS [facility] SLP Acute Rehab Services Pager [phone] Office [phone]

## 2018-12-08 NOTE — Progress Notes (Signed)
Subjective: Doing well. Pain controlled.    Objective: Vital signs in last 24 hours: Temp:  [97.4 F (36.3 C)-98.7 F (37.1 C)] 98.7 F (37.1 C) (04/14 1204) Pulse Rate:  [76-93] 85 (04/14 1204) Resp:  [18-20] 18 (04/14 0900) BP: (118-142)/(45-95) 121/47 (04/14 1204) SpO2:  [91 %-100 %] 98 % (04/14 1204) FiO2 (%):  [16 %-18 %] 16 % (04/14 1204) Weight:  [61.7 kg] 61.7 kg (04/14 0500)  Intake/Output from previous day: 04/13 0701 - 04/14 0700 In: 390 [P.O.:390] Out: 250 [Urine:250] Intake/Output this shift: No intake/output data recorded.  Recent Labs    12/05/18 1433 12/05/18 2255 12/06/18 0245 12/07/18 0906 12/08/18 0316  HGB 6.8* 7.4* 7.5* 8.4* 7.2*   Recent Labs    12/07/18 0906 12/08/18 0316  WBC 8.7 6.6  RBC 2.96* 2.54*  HCT 25.7* 22.0*  PLT 128* 126*   Recent Labs    12/07/18 0906 12/08/18 0316  NA 141 143  K 3.5 3.6  CL 110 111  CO2 23 23  BUN 20 23  CREATININE 0.76 0.72  GLUCOSE 230* 204*  CALCIUM 7.7* 7.9*   No results for input(s): LABPT, INR in the last 72 hours.  Exam: Hip wound looks good. Staples intact.     Assessment/Plan: Staple from ortho standpoint.  Transfer to SNF when bed available and cleared by medicine team.  Scripts on chart for ultram (pain) and aspirin (dvt prophylaxis).      Zonia Kief 12/08/2018, 12:44 PM

## 2018-12-08 NOTE — Discharge Instructions (Signed)
-  RN to perform daily wound checks and dressing changes.  Do not apply any creams or ointments to incisions.  -Okay to shower but no tub soaking.  -Strict partial weightbearing left lower extremity 50%  -Up with assistance  -If any issues or questions contact our office immediately.

## 2018-12-08 NOTE — Progress Notes (Signed)
Report called to [place], RN [facility] PTAR at bedside to transfer patient. AVS provided to PTAR.

## 2018-12-09 NOTE — Care Management Important Message (Signed)
Important Message  Patient Details  Name: Kristin Grant MRN: [id number] Date of Birth: 1941/05/09   Medicare Important Message Given:  Yes Im signed on 12/08/2018  Dorena Bodo 12/09/2018, 9:26 AM

## 2018-12-10 DIAGNOSIS — M25552 Pain in left hip: Secondary | ICD-10-CM | POA: Diagnosis not present

## 2018-12-10 DIAGNOSIS — I10 Essential (primary) hypertension: Secondary | ICD-10-CM | POA: Diagnosis not present

## 2018-12-10 DIAGNOSIS — E039 Hypothyroidism, unspecified: Secondary | ICD-10-CM | POA: Diagnosis not present

## 2018-12-10 DIAGNOSIS — F0280 Dementia in other diseases classified elsewhere without behavioral disturbance: Secondary | ICD-10-CM | POA: Diagnosis not present

## 2018-12-10 LAB — CULTURE, BLOOD (ROUTINE X 2)
Culture: NO GROWTH
Culture: NO GROWTH
Special Requests: ADEQUATE

## 2018-12-18 ENCOUNTER — Other Ambulatory Visit: Payer: Self-pay | Admitting: *Deleted

## 2018-12-18 NOTE — Patient Outreach (Addendum)
[facility]) Care Management  12/18/2018  Khiabet Mccann [birth year] [id number]    Member assessed for potential [facility] Care Management program services as a benefit of her [employer] insurance.  Mrs Picone was discussed in the weekly telephonic IDT meeting with [facility] UM team and facility staff on 12/17/18. Mrs. Krausz is receiving rehab therapy at [facility].   Facility staff reports Mrs. Haan was a long term resident of [facility] for about 8 years prior to admission.  Writer will plan to sign off for now for potential [facility] Care Management services since it appears member will return to ALF at [facility] discharge.   Raiford Noble, MSN-Ed, RN,BSN [facility] Post Acute Care Coordinator [phone]

## 2018-12-24 ENCOUNTER — Ambulatory Visit (INDEPENDENT_AMBULATORY_CARE_PROVIDER_SITE_OTHER): Payer: No Typology Code available for payment source | Admitting: Orthopaedic Surgery

## 2018-12-24 ENCOUNTER — Ambulatory Visit (INDEPENDENT_AMBULATORY_CARE_PROVIDER_SITE_OTHER): Payer: No Typology Code available for payment source

## 2018-12-24 ENCOUNTER — Other Ambulatory Visit: Payer: Self-pay

## 2018-12-24 ENCOUNTER — Encounter (INDEPENDENT_AMBULATORY_CARE_PROVIDER_SITE_OTHER): Payer: Self-pay | Admitting: Orthopaedic Surgery

## 2018-12-24 ENCOUNTER — Other Ambulatory Visit: Payer: Self-pay | Admitting: *Deleted

## 2018-12-24 VITALS — Ht 64.0 in | Wt 136.0 lb

## 2018-12-24 DIAGNOSIS — M25552 Pain in left hip: Secondary | ICD-10-CM

## 2018-12-24 NOTE — Patient Outreach (Signed)
[facility]) Care Management  12/24/2018  Kristin Grant Apr 16, 1941 [id number]    Member discussed during telephonic IDT meeting with [facility] UM team and facility staff. [name] remains at [facility] SNF for rehab therapy.  Per IDT discussion, it is questionable whether member will be able to return to ALF at her current level of function.  Writer signed off earlier in SNF stay due to disposition plan being to return to ALF.  However, will plan to follow along for definitive disposition plans.  Will continue to assess for potential [facility] Care [facility]aga[facility] needs as a benefit of member's [employer] Medicare insurance.   Raiford Noble, MSN-Ed, RN,BSN [facility] Post Acute Care Coordinator [phone]

## 2018-12-24 NOTE — Progress Notes (Signed)
Post-Op Visit Note   Patient: Kristin Grant           Date of Birth: 19-May-1941           MRN: [id number] Visit Date: 12/24/2018 PCP: Salley Scarlet, MD   Assessment & Plan: Postop left inotrope.  She standing not doing much walking she had previous surgery on her opposite hip which she broke in December 2019.  X-rays today left hip demonstrates she has had some compression at the fracture site.  Screws in good position in the head no angulation.  Chief Complaint:  Chief Complaint  Patient presents with  . Left Hip - Routine Post Op    12/04/2018 IM Nail Left Intertroch   Visit Diagnoses:  1. Pain in left hip     Plan: Postop left inner troches fracture staples removed at the skilled facility.  Continue weightbearing as tolerated office follow-up PRN.  She will work on standing longer and gradually progressed with weightbearing since she is wanting to be independent again.  Follow-Up Instructions: No follow-ups on file.   Orders:  Orders Placed This Encounter  Procedures  . XR HIP UNILAT W OR W/O PELVIS 2-3 VIEWS LEFT   No orders of the defined types were placed in this encounter.   Imaging: No results found.  PMFS History: Patient Active Problem List   Diagnosis Date Noted  . Intertrochanteric fracture of left hip (HCC) 12/04/2018  . Intertrochanteric fracture of left hip, closed, initial encounter (HCC) 12/04/2018  . Acute blood loss anemia 08/05/2018  . Closed intertrochanteric fracture of hip, right, initial encounter (HCC) 08/03/2018  . Malnutrition of moderate degree 08/03/2018  . Coronary artery disease 11/17/2017  . Thrombocytopenia (HCC) 11/17/2017  . Right arm fracture 11/17/2017  . Cerebral atrophy (HCC) 11/17/2017  . Hypokalemia 11/17/2017  . History of CVA (cerebrovascular accident) 11/15/2017  . Suspected stroke patient last known to be well 3 to 4.5 hours ago   . Closed fracture of proximal end of right humerus 09/18/17 10/16/2017  . Osteoporosis  01/09/2017  . Loss of weight 01/12/2016  . Stroke-like episode (HCC) s/p IV tPA 11/22/2015  . Diabetes mellitus, type II (HCC) 06/22/2014  . Protein-calorie malnutrition (HCC) 12/15/2013  . Fall 08/17/2013  . Back pain 08/17/2013  . Pressure ulcer, stage 1 07/06/2013  . Abnormal development of nail 03/28/2013  . Gait abnormality 12/10/2011  . Dementia (HCC) 11/21/2011  . Hypertension 11/21/2011  . Hypothyroidism 11/21/2011   Past Medical History:  Diagnosis Date  . Alzheimer disease (HCC)   . Arthritis   . Coronary artery disease   . Diabetes mellitus   . Difficulty walking   . Hypertension   . Reflux   . Stroke [facility])     Family History  Problem Relation Age of Onset  . Hypertension Mother   . Heart disease Mother   . Hypertension Father   . Heart disease Father   . Hypertension Sister   . Heart disease Sister   . Cancer Sister        BREAST   . Hypertension Brother   . Heart disease Brother     Past Surgical History:  Procedure Laterality Date  . APPENDECTOMY    . FINGER DEBRIDEMENT    . INTRAMEDULLARY (IM) NAIL INTERTROCHANTERIC Right 08/03/2018   Procedure: INTRAMEDULLARY (IM) NAIL INTERTROCHANTRIC;  Surgeon: Eldred Manges, MD;  Location: [facility] OR;  Service: Orthopedics;  Laterality: Right;  . INTRAMEDULLARY (IM) NAIL INTERTROCHANTERIC Left 12/04/2018   Procedure:  INTRAMEDULLARY (IM) NAIL INTERTROCHANTRIC;  Surgeon: Eldred MangesYates, Nels Munn C, MD;  Location: [facility] OR;  Service: Orthopedics;  Laterality: Left;   Social History   Occupational History  . Not on file  Tobacco Use  . Smoking status: Never Smoker  . Smokeless tobacco: Never Used  Substance and Sexual Activity  . Alcohol use: No  . Drug use: No  . Sexual activity: Not on file

## 2018-12-29 DIAGNOSIS — R1010 Upper abdominal pain, unspecified: Secondary | ICD-10-CM | POA: Diagnosis not present

## 2018-12-29 DIAGNOSIS — I447 Left bundle-branch block, unspecified: Secondary | ICD-10-CM | POA: Diagnosis not present

## 2018-12-29 DIAGNOSIS — Z79899 Other long term (current) drug therapy: Secondary | ICD-10-CM | POA: Diagnosis not present

## 2018-12-29 DIAGNOSIS — R110 Nausea: Secondary | ICD-10-CM | POA: Diagnosis not present

## 2018-12-29 DIAGNOSIS — R0602 Shortness of breath: Secondary | ICD-10-CM | POA: Diagnosis not present

## 2018-12-29 DIAGNOSIS — R079 Chest pain, unspecified: Secondary | ICD-10-CM | POA: Diagnosis not present

## 2019-01-08 DIAGNOSIS — F0280 Dementia in other diseases classified elsewhere without behavioral disturbance: Secondary | ICD-10-CM | POA: Diagnosis not present

## 2019-01-08 DIAGNOSIS — M25552 Pain in left hip: Secondary | ICD-10-CM | POA: Diagnosis not present

## 2019-01-08 DIAGNOSIS — I10 Essential (primary) hypertension: Secondary | ICD-10-CM | POA: Diagnosis not present

## 2019-01-08 DIAGNOSIS — E039 Hypothyroidism, unspecified: Secondary | ICD-10-CM | POA: Diagnosis not present

## 2019-01-14 ENCOUNTER — Other Ambulatory Visit: Payer: Self-pay | Admitting: *Deleted

## 2019-01-14 NOTE — Patient Outreach (Signed)
Member discussed in telephonic IDT conference meeting with [facility] SNF staff and [facility] UM team.   Mrs. Dyer remains at [facility] SNF receiving rehab therapy.   Member has been assessed for potential [facility] Care Management needs as a benefit of her [employer].  However, it is confirmed during telephonic IDT meeting today that member will return to ALF at [facility] discharge.   Will sign off as there are no identifiable [facility] Care Management needs at this time.   Raiford Noble, MSN-Ed, RN,BSN [facility] Post Acute Care Coordinator [phone]

## 2019-01-20 DIAGNOSIS — S72142D Displaced intertrochanteric fracture of left femur, subsequent encounter for closed fracture with routine healing: Secondary | ICD-10-CM | POA: Diagnosis not present

## 2019-01-20 DIAGNOSIS — F0281 Dementia in other diseases classified elsewhere with behavioral disturbance: Secondary | ICD-10-CM | POA: Diagnosis not present

## 2019-01-20 DIAGNOSIS — M6281 Muscle weakness (generalized): Secondary | ICD-10-CM | POA: Diagnosis not present

## 2019-01-20 DIAGNOSIS — R2689 Other abnormalities of gait and mobility: Secondary | ICD-10-CM | POA: Diagnosis not present

## 2019-01-20 DIAGNOSIS — E039 Hypothyroidism, unspecified: Secondary | ICD-10-CM | POA: Diagnosis not present

## 2019-01-20 DIAGNOSIS — Z9181 History of falling: Secondary | ICD-10-CM | POA: Diagnosis not present

## 2019-01-20 DIAGNOSIS — G309 Alzheimer's disease, unspecified: Secondary | ICD-10-CM | POA: Diagnosis not present

## 2019-01-20 DIAGNOSIS — D509 Iron deficiency anemia, unspecified: Secondary | ICD-10-CM | POA: Diagnosis not present

## 2019-01-20 DIAGNOSIS — M1991 Primary osteoarthritis, unspecified site: Secondary | ICD-10-CM | POA: Diagnosis not present

## 2019-01-20 DIAGNOSIS — I2510 Atherosclerotic heart disease of native coronary artery without angina pectoris: Secondary | ICD-10-CM | POA: Diagnosis not present

## 2019-01-20 DIAGNOSIS — D696 Thrombocytopenia, unspecified: Secondary | ICD-10-CM | POA: Diagnosis not present

## 2019-01-20 DIAGNOSIS — F339 Major depressive disorder, recurrent, unspecified: Secondary | ICD-10-CM | POA: Diagnosis not present

## 2019-01-20 DIAGNOSIS — E119 Type 2 diabetes mellitus without complications: Secondary | ICD-10-CM | POA: Diagnosis not present

## 2019-01-20 DIAGNOSIS — Z8673 Personal history of transient ischemic attack (TIA), and cerebral infarction without residual deficits: Secondary | ICD-10-CM | POA: Diagnosis not present

## 2019-01-20 DIAGNOSIS — R1312 Dysphagia, oropharyngeal phase: Secondary | ICD-10-CM | POA: Diagnosis not present

## 2019-01-21 DIAGNOSIS — G309 Alzheimer's disease, unspecified: Secondary | ICD-10-CM | POA: Diagnosis not present

## 2019-01-21 DIAGNOSIS — I2510 Atherosclerotic heart disease of native coronary artery without angina pectoris: Secondary | ICD-10-CM | POA: Diagnosis not present

## 2019-01-21 DIAGNOSIS — E119 Type 2 diabetes mellitus without complications: Secondary | ICD-10-CM | POA: Diagnosis not present

## 2019-01-21 DIAGNOSIS — F0281 Dementia in other diseases classified elsewhere with behavioral disturbance: Secondary | ICD-10-CM | POA: Diagnosis not present

## 2019-01-21 DIAGNOSIS — S72142D Displaced intertrochanteric fracture of left femur, subsequent encounter for closed fracture with routine healing: Secondary | ICD-10-CM | POA: Diagnosis not present

## 2019-01-21 DIAGNOSIS — M6281 Muscle weakness (generalized): Secondary | ICD-10-CM | POA: Diagnosis not present

## 2019-01-22 ENCOUNTER — Other Ambulatory Visit: Payer: Self-pay

## 2019-01-22 ENCOUNTER — Ambulatory Visit (INDEPENDENT_AMBULATORY_CARE_PROVIDER_SITE_OTHER): Payer: Medicare Other | Admitting: Family Medicine

## 2019-01-22 VITALS — BP 124/60 | HR 86 | Temp 98.9°F | Resp 18 | Wt 132.0 lb

## 2019-01-22 DIAGNOSIS — E43 Unspecified severe protein-calorie malnutrition: Secondary | ICD-10-CM | POA: Diagnosis not present

## 2019-01-22 DIAGNOSIS — I639 Cerebral infarction, unspecified: Secondary | ICD-10-CM

## 2019-01-22 DIAGNOSIS — S72142A Displaced intertrochanteric fracture of left femur, initial encounter for closed fracture: Secondary | ICD-10-CM

## 2019-01-22 DIAGNOSIS — D62 Acute posthemorrhagic anemia: Secondary | ICD-10-CM

## 2019-01-22 DIAGNOSIS — F0281 Dementia in other diseases classified elsewhere with behavioral disturbance: Secondary | ICD-10-CM | POA: Diagnosis not present

## 2019-01-22 DIAGNOSIS — I10 Essential (primary) hypertension: Secondary | ICD-10-CM | POA: Diagnosis not present

## 2019-01-22 DIAGNOSIS — Z794 Long term (current) use of insulin: Secondary | ICD-10-CM | POA: Diagnosis not present

## 2019-01-22 DIAGNOSIS — E1100 Type 2 diabetes mellitus with hyperosmolarity without nonketotic hyperglycemic-hyperosmolar coma (NKHHC): Secondary | ICD-10-CM | POA: Diagnosis not present

## 2019-01-22 DIAGNOSIS — G309 Alzheimer's disease, unspecified: Secondary | ICD-10-CM | POA: Diagnosis not present

## 2019-01-22 DIAGNOSIS — M6281 Muscle weakness (generalized): Secondary | ICD-10-CM | POA: Diagnosis not present

## 2019-01-22 DIAGNOSIS — I2510 Atherosclerotic heart disease of native coronary artery without angina pectoris: Secondary | ICD-10-CM | POA: Diagnosis not present

## 2019-01-22 DIAGNOSIS — S72142D Displaced intertrochanteric fracture of left femur, subsequent encounter for closed fracture with routine healing: Secondary | ICD-10-CM | POA: Diagnosis not present

## 2019-01-22 DIAGNOSIS — F0280 Dementia in other diseases classified elsewhere without behavioral disturbance: Secondary | ICD-10-CM | POA: Diagnosis not present

## 2019-01-22 DIAGNOSIS — E119 Type 2 diabetes mellitus without complications: Secondary | ICD-10-CM | POA: Diagnosis not present

## 2019-01-22 NOTE — Progress Notes (Signed)
   Subjective:    Patient ID: Kristin Grant, female    DOB: 06/11/1941, 78 y.o.   MRN: [id number]  Patient presents for Hospitalization Follow-up  Pt here for hospital f/u, she sustined a left intertrochanteric fracture of her left hip after a fall s/p surgical intervention  Anemia of acute blood loss- due for repeat CBC DM- she was taken off the lantus at discharge and continued on Sliding scale insulin, now back on Lantus and HUmalog   Last A1C 7.3% in Jan    Previous Lantus  14  And Humolog  6 units BID with meals lunch and dinner,  Diet- she is on dysphagia 3 diet   Weight up 12lbs since Feb taking remeron No concerned noted on ALF sheet, she returned there Tuesday   REVIEWED hospital discharge, all other medications kept the same  Review Of Systems:  GEN- denies fatigue, fever, weight loss,weakness, recent illness HEENT- denies eye drainage, change in vision, nasal discharge, CVS- denies chest pain, palpitations RESP- denies SOB, cough, wheeze ABD- denies N/V, change in stools, abd pain GU- denies dysuria, hematuria, dribbling, incontinence MSK- +joint pain, muscle aches, injury Neuro- denies headache, dizziness, syncope, seizure activity       Objective:    BP 124/60   Pulse 86   Temp 98.9 F (37.2 C)   Resp 18   Wt 132 lb (59.9 kg)   SpO2 98%   BMI 22.66 kg/m  GEN- NAD, alert and oriented person/place,sitting in wheelchair  HEENT- PERRL, EOMI, non injected sclera, pink conjunctiva, MMM, oropharynx clear Neck- Supple, no thyromegaly CVS- RRR, no murmur RESP-CTAB ABD-NABS,soft,NT,ND EXT- No edema MSK- able to extend and lift legs from chair, able to ambulate with assistance very short distance Pulses- Radial, DP- 2+        Assessment & Plan:      Problem List Items Addressed This Visit      Unprioritized   Acute blood loss anemia    Recheck CBC       Dementia (HCC) (Chronic)   Diabetes mellitus, type II (HCC) (Chronic)    Recheck A1C, D/C  SSI Return to lantus 14 units and humalog 6 units with lunch and dinner        Relevant Orders   Hemoglobin A1c (Completed)   Hypertension - Primary (Chronic)    Controlled no changes      Relevant Orders   Comprehensive metabolic panel (Completed)   CBC with Differential/Platelet (Completed)   Intertrochanteric fracture of left hip (HCC)    S/p surgical intervention Now with PT Pain controlled with tylenol Recommendations per surgeon      Protein-calorie malnutrition (HCC)    Improved weight and appetite Continue remeron         Note: This dictation was prepared with Dragon dictation along with smaller phrase technology. Any transcriptional errors that result from this process are unintentional.

## 2019-01-23 LAB — CBC WITH DIFFERENTIAL/PLATELET
Absolute Monocytes: 708 cells/uL (ref 200–950)
Basophils Absolute: 39 cells/uL (ref 0–200)
Basophils Relative: 0.4 %
Eosinophils Absolute: 320 cells/uL (ref 15–500)
Eosinophils Relative: 3.3 %
HCT: 39.3 % (ref 35.0–45.0)
Hemoglobin: 12.6 g/dL (ref 11.7–15.5)
Lymphs Abs: 1533 cells/uL (ref 850–3900)
MCH: 27 pg (ref 27.0–33.0)
MCHC: 32.1 g/dL (ref 32.0–36.0)
MCV: 84.2 fL (ref 80.0–100.0)
MPV: 13.1 fL — ABNORMAL HIGH (ref 7.5–12.5)
Monocytes Relative: 7.3 %
Neutro Abs: 7100 cells/uL (ref 1500–7800)
Neutrophils Relative %: 73.2 %
Platelets: 163 10*3/uL (ref 140–400)
RBC: 4.67 10*6/uL (ref 3.80–5.10)
RDW: 14.8 % (ref 11.0–15.0)
Total Lymphocyte: 15.8 %
WBC: 9.7 10*3/uL (ref 3.8–10.8)

## 2019-01-23 LAB — COMPREHENSIVE METABOLIC PANEL
AG Ratio: 1.7 (calc) (ref 1.0–2.5)
ALT: 13 U/L (ref 6–29)
AST: 15 U/L (ref 10–35)
Albumin: 4.1 g/dL (ref 3.6–5.1)
Alkaline phosphatase (APISO): 85 U/L (ref 37–153)
BUN: 12 mg/dL (ref 7–25)
CO2: 26 mmol/L (ref 20–32)
Calcium: 9.4 mg/dL (ref 8.6–10.4)
Chloride: 103 mmol/L (ref 98–110)
Creat: 0.67 mg/dL (ref 0.60–0.93)
Globulin: 2.4 g/dL (calc) (ref 1.9–3.7)
Glucose, Bld: 161 mg/dL — ABNORMAL HIGH (ref 65–139)
Potassium: 4 mmol/L (ref 3.5–5.3)
Sodium: 139 mmol/L (ref 135–146)
Total Bilirubin: 0.4 mg/dL (ref 0.2–1.2)
Total Protein: 6.5 g/dL (ref 6.1–8.1)

## 2019-01-23 LAB — HEMOGLOBIN A1C
Hgb A1c MFr Bld: 6 % of total Hgb — ABNORMAL HIGH (ref <5.7)
Mean Plasma Glucose: 126 (calc)
eAG (mmol/L): 7 (calc)

## 2019-01-24 ENCOUNTER — Encounter: Payer: Self-pay | Admitting: Family Medicine

## 2019-01-24 NOTE — Assessment & Plan Note (Signed)
S/p surgical intervention Now with PT Pain controlled with tylenol Recommendations per surgeon

## 2019-01-24 NOTE — Assessment & Plan Note (Signed)
Controlled no changes 

## 2019-01-24 NOTE — Assessment & Plan Note (Signed)
Improved weight and appetite Continue remeron

## 2019-01-24 NOTE — Assessment & Plan Note (Signed)
Recheck A1C, D/C SSI Return to lantus 14 units and humalog 6 units with lunch and dinner

## 2019-01-24 NOTE — Assessment & Plan Note (Signed)
Recheck CBC. 

## 2019-01-25 ENCOUNTER — Telehealth: Payer: Self-pay | Admitting: Family Medicine

## 2019-01-25 DIAGNOSIS — E119 Type 2 diabetes mellitus without complications: Secondary | ICD-10-CM | POA: Diagnosis not present

## 2019-01-25 DIAGNOSIS — M6281 Muscle weakness (generalized): Secondary | ICD-10-CM | POA: Diagnosis not present

## 2019-01-25 DIAGNOSIS — F0281 Dementia in other diseases classified elsewhere with behavioral disturbance: Secondary | ICD-10-CM | POA: Diagnosis not present

## 2019-01-25 DIAGNOSIS — I2510 Atherosclerotic heart disease of native coronary artery without angina pectoris: Secondary | ICD-10-CM | POA: Diagnosis not present

## 2019-01-25 DIAGNOSIS — S72142D Displaced intertrochanteric fracture of left femur, subsequent encounter for closed fracture with routine healing: Secondary | ICD-10-CM | POA: Diagnosis not present

## 2019-01-25 DIAGNOSIS — G309 Alzheimer's disease, unspecified: Secondary | ICD-10-CM | POA: Diagnosis not present

## 2019-01-25 NOTE — Telephone Encounter (Signed)
rx care had questions regarding humalog

## 2019-01-26 DIAGNOSIS — I2510 Atherosclerotic heart disease of native coronary artery without angina pectoris: Secondary | ICD-10-CM | POA: Diagnosis not present

## 2019-01-26 DIAGNOSIS — F0281 Dementia in other diseases classified elsewhere with behavioral disturbance: Secondary | ICD-10-CM | POA: Diagnosis not present

## 2019-01-26 DIAGNOSIS — E119 Type 2 diabetes mellitus without complications: Secondary | ICD-10-CM | POA: Diagnosis not present

## 2019-01-26 DIAGNOSIS — G309 Alzheimer's disease, unspecified: Secondary | ICD-10-CM | POA: Diagnosis not present

## 2019-01-26 DIAGNOSIS — M6281 Muscle weakness (generalized): Secondary | ICD-10-CM | POA: Diagnosis not present

## 2019-01-26 DIAGNOSIS — S72142D Displaced intertrochanteric fracture of left femur, subsequent encounter for closed fracture with routine healing: Secondary | ICD-10-CM | POA: Diagnosis not present

## 2019-01-27 DIAGNOSIS — E119 Type 2 diabetes mellitus without complications: Secondary | ICD-10-CM | POA: Diagnosis not present

## 2019-01-27 DIAGNOSIS — F0281 Dementia in other diseases classified elsewhere with behavioral disturbance: Secondary | ICD-10-CM | POA: Diagnosis not present

## 2019-01-27 DIAGNOSIS — I2510 Atherosclerotic heart disease of native coronary artery without angina pectoris: Secondary | ICD-10-CM | POA: Diagnosis not present

## 2019-01-27 DIAGNOSIS — S72142D Displaced intertrochanteric fracture of left femur, subsequent encounter for closed fracture with routine healing: Secondary | ICD-10-CM | POA: Diagnosis not present

## 2019-01-27 DIAGNOSIS — G309 Alzheimer's disease, unspecified: Secondary | ICD-10-CM | POA: Diagnosis not present

## 2019-01-27 DIAGNOSIS — M6281 Muscle weakness (generalized): Secondary | ICD-10-CM | POA: Diagnosis not present

## 2019-01-29 DIAGNOSIS — S72142D Displaced intertrochanteric fracture of left femur, subsequent encounter for closed fracture with routine healing: Secondary | ICD-10-CM | POA: Diagnosis not present

## 2019-01-29 DIAGNOSIS — E119 Type 2 diabetes mellitus without complications: Secondary | ICD-10-CM | POA: Diagnosis not present

## 2019-01-29 DIAGNOSIS — I2510 Atherosclerotic heart disease of native coronary artery without angina pectoris: Secondary | ICD-10-CM | POA: Diagnosis not present

## 2019-01-29 DIAGNOSIS — G309 Alzheimer's disease, unspecified: Secondary | ICD-10-CM | POA: Diagnosis not present

## 2019-01-29 DIAGNOSIS — F0281 Dementia in other diseases classified elsewhere with behavioral disturbance: Secondary | ICD-10-CM | POA: Diagnosis not present

## 2019-01-29 DIAGNOSIS — M6281 Muscle weakness (generalized): Secondary | ICD-10-CM | POA: Diagnosis not present

## 2019-02-01 DIAGNOSIS — E119 Type 2 diabetes mellitus without complications: Secondary | ICD-10-CM | POA: Diagnosis not present

## 2019-02-01 DIAGNOSIS — F0281 Dementia in other diseases classified elsewhere with behavioral disturbance: Secondary | ICD-10-CM | POA: Diagnosis not present

## 2019-02-01 DIAGNOSIS — S72142D Displaced intertrochanteric fracture of left femur, subsequent encounter for closed fracture with routine healing: Secondary | ICD-10-CM | POA: Diagnosis not present

## 2019-02-01 DIAGNOSIS — M6281 Muscle weakness (generalized): Secondary | ICD-10-CM | POA: Diagnosis not present

## 2019-02-01 DIAGNOSIS — I2510 Atherosclerotic heart disease of native coronary artery without angina pectoris: Secondary | ICD-10-CM | POA: Diagnosis not present

## 2019-02-01 DIAGNOSIS — G309 Alzheimer's disease, unspecified: Secondary | ICD-10-CM | POA: Diagnosis not present

## 2019-02-02 DIAGNOSIS — M6281 Muscle weakness (generalized): Secondary | ICD-10-CM | POA: Diagnosis not present

## 2019-02-02 DIAGNOSIS — E119 Type 2 diabetes mellitus without complications: Secondary | ICD-10-CM | POA: Diagnosis not present

## 2019-02-02 DIAGNOSIS — S72142D Displaced intertrochanteric fracture of left femur, subsequent encounter for closed fracture with routine healing: Secondary | ICD-10-CM | POA: Diagnosis not present

## 2019-02-02 DIAGNOSIS — I2510 Atherosclerotic heart disease of native coronary artery without angina pectoris: Secondary | ICD-10-CM | POA: Diagnosis not present

## 2019-02-02 DIAGNOSIS — G309 Alzheimer's disease, unspecified: Secondary | ICD-10-CM | POA: Diagnosis not present

## 2019-02-02 DIAGNOSIS — F0281 Dementia in other diseases classified elsewhere with behavioral disturbance: Secondary | ICD-10-CM | POA: Diagnosis not present

## 2019-02-02 MED ORDER — INSULIN LISPRO (1 UNIT DIAL) 100 UNIT/ML (KWIKPEN): 6.0000 [IU] | PEN_INJECTOR | Freq: Two times a day (BID) | SUBCUTANEOUS | 11 refills | Status: DC

## 2019-02-03 DIAGNOSIS — I2510 Atherosclerotic heart disease of native coronary artery without angina pectoris: Secondary | ICD-10-CM | POA: Diagnosis not present

## 2019-02-03 DIAGNOSIS — M6281 Muscle weakness (generalized): Secondary | ICD-10-CM | POA: Diagnosis not present

## 2019-02-03 DIAGNOSIS — E119 Type 2 diabetes mellitus without complications: Secondary | ICD-10-CM | POA: Diagnosis not present

## 2019-02-03 DIAGNOSIS — S72142D Displaced intertrochanteric fracture of left femur, subsequent encounter for closed fracture with routine healing: Secondary | ICD-10-CM | POA: Diagnosis not present

## 2019-02-03 DIAGNOSIS — F0281 Dementia in other diseases classified elsewhere with behavioral disturbance: Secondary | ICD-10-CM | POA: Diagnosis not present

## 2019-02-03 DIAGNOSIS — G309 Alzheimer's disease, unspecified: Secondary | ICD-10-CM | POA: Diagnosis not present

## 2019-02-04 ENCOUNTER — Other Ambulatory Visit: Payer: Self-pay | Admitting: *Deleted

## 2019-02-05 ENCOUNTER — Other Ambulatory Visit: Payer: Self-pay | Admitting: *Deleted

## 2019-02-05 MED ORDER — ALBUTEROL SULFATE HFA 108 (90 BASE) MCG/ACT IN AERS: 2.0000 | INHALATION_SPRAY | Freq: Four times a day (QID) | RESPIRATORY_TRACT | 11 refills | Status: DC | PRN

## 2019-02-05 MED ORDER — MIRTAZAPINE 7.5 MG PO TABS: 7.5000 mg | ORAL_TABLET | Freq: Every day | ORAL | 11 refills | Status: DC

## 2019-02-05 MED ORDER — INSULIN LISPRO (1 UNIT DIAL) 100 UNIT/ML (KWIKPEN): 6.0000 [IU] | PEN_INJECTOR | Freq: Two times a day (BID) | SUBCUTANEOUS | 11 refills | Status: DC

## 2019-02-05 MED ORDER — LINAGLIPTIN 5 MG PO TABS: 5.0000 mg | ORAL_TABLET | Freq: Every day | ORAL | 11 refills | Status: DC

## 2019-02-05 MED ORDER — LEVOTHYROXINE SODIUM 50 MCG PO TABS: 50.0000 ug | ORAL_TABLET | Freq: Every day | ORAL | 11 refills | Status: DC

## 2019-02-05 MED ORDER — ASPIRIN EC 81 MG PO TBEC: 81.0000 mg | DELAYED_RELEASE_TABLET | Freq: Every day | ORAL | 11 refills | Status: DC

## 2019-02-05 MED ORDER — TRAMADOL HCL 50 MG PO TABS: 50.0000 mg | ORAL_TABLET | Freq: Four times a day (QID) | ORAL | 3 refills | Status: DC | PRN

## 2019-02-05 MED ORDER — DAILY VITE PO TABS: 1.0000 | ORAL_TABLET | Freq: Every day | ORAL | 11 refills | Status: DC

## 2019-02-05 MED ORDER — CLOPIDOGREL BISULFATE 75 MG PO TABS: ORAL_TABLET | ORAL | 11 refills | Status: DC

## 2019-02-05 MED ORDER — LISINOPRIL 40 MG PO TABS: 40.0000 mg | ORAL_TABLET | Freq: Every day | ORAL | 11 refills | Status: DC

## 2019-02-05 MED ORDER — PROPRANOLOL HCL 40 MG PO TABS: 40.0000 mg | ORAL_TABLET | Freq: Two times a day (BID) | ORAL | 11 refills | Status: DC

## 2019-02-05 MED ORDER — IRON 325 (65 FE) MG PO TABS: 1.0000 | ORAL_TABLET | Freq: Every day | ORAL | 11 refills | Status: DC

## 2019-02-05 NOTE — Telephone Encounter (Signed)
Received fax from pharmacy requesting medication reconciliation and refills on current prescriptions.   Noted discrepancies from faxed orders from rehab and patient chart.   MD made aware and new orders obtained.   Restart Levothyroxine, Remeron, Tradjenta, and Ultram.   Prescription sent to pharmacy for routine medications. Please send refills of controlled substance.   Orders faxed to facility to restart meds.

## 2019-02-08 DIAGNOSIS — S72142D Displaced intertrochanteric fracture of left femur, subsequent encounter for closed fracture with routine healing: Secondary | ICD-10-CM | POA: Diagnosis not present

## 2019-02-08 DIAGNOSIS — G309 Alzheimer's disease, unspecified: Secondary | ICD-10-CM | POA: Diagnosis not present

## 2019-02-08 DIAGNOSIS — M6281 Muscle weakness (generalized): Secondary | ICD-10-CM | POA: Diagnosis not present

## 2019-02-08 DIAGNOSIS — I2510 Atherosclerotic heart disease of native coronary artery without angina pectoris: Secondary | ICD-10-CM | POA: Diagnosis not present

## 2019-02-08 DIAGNOSIS — F0281 Dementia in other diseases classified elsewhere with behavioral disturbance: Secondary | ICD-10-CM | POA: Diagnosis not present

## 2019-02-08 DIAGNOSIS — E119 Type 2 diabetes mellitus without complications: Secondary | ICD-10-CM | POA: Diagnosis not present

## 2019-02-09 DIAGNOSIS — E119 Type 2 diabetes mellitus without complications: Secondary | ICD-10-CM | POA: Diagnosis not present

## 2019-02-09 DIAGNOSIS — M6281 Muscle weakness (generalized): Secondary | ICD-10-CM | POA: Diagnosis not present

## 2019-02-09 DIAGNOSIS — G309 Alzheimer's disease, unspecified: Secondary | ICD-10-CM | POA: Diagnosis not present

## 2019-02-09 DIAGNOSIS — I2510 Atherosclerotic heart disease of native coronary artery without angina pectoris: Secondary | ICD-10-CM | POA: Diagnosis not present

## 2019-02-09 DIAGNOSIS — S72142D Displaced intertrochanteric fracture of left femur, subsequent encounter for closed fracture with routine healing: Secondary | ICD-10-CM | POA: Diagnosis not present

## 2019-02-09 DIAGNOSIS — F0281 Dementia in other diseases classified elsewhere with behavioral disturbance: Secondary | ICD-10-CM | POA: Diagnosis not present

## 2019-02-10 DIAGNOSIS — F0281 Dementia in other diseases classified elsewhere with behavioral disturbance: Secondary | ICD-10-CM | POA: Diagnosis not present

## 2019-02-10 DIAGNOSIS — E119 Type 2 diabetes mellitus without complications: Secondary | ICD-10-CM | POA: Diagnosis not present

## 2019-02-10 DIAGNOSIS — M6281 Muscle weakness (generalized): Secondary | ICD-10-CM | POA: Diagnosis not present

## 2019-02-10 DIAGNOSIS — S72142D Displaced intertrochanteric fracture of left femur, subsequent encounter for closed fracture with routine healing: Secondary | ICD-10-CM | POA: Diagnosis not present

## 2019-02-10 DIAGNOSIS — G309 Alzheimer's disease, unspecified: Secondary | ICD-10-CM | POA: Diagnosis not present

## 2019-02-10 DIAGNOSIS — I2510 Atherosclerotic heart disease of native coronary artery without angina pectoris: Secondary | ICD-10-CM | POA: Diagnosis not present

## 2019-02-11 DIAGNOSIS — G309 Alzheimer's disease, unspecified: Secondary | ICD-10-CM | POA: Diagnosis not present

## 2019-02-11 DIAGNOSIS — F0281 Dementia in other diseases classified elsewhere with behavioral disturbance: Secondary | ICD-10-CM | POA: Diagnosis not present

## 2019-02-11 DIAGNOSIS — S72142D Displaced intertrochanteric fracture of left femur, subsequent encounter for closed fracture with routine healing: Secondary | ICD-10-CM | POA: Diagnosis not present

## 2019-02-11 DIAGNOSIS — I2510 Atherosclerotic heart disease of native coronary artery without angina pectoris: Secondary | ICD-10-CM | POA: Diagnosis not present

## 2019-02-11 DIAGNOSIS — M6281 Muscle weakness (generalized): Secondary | ICD-10-CM | POA: Diagnosis not present

## 2019-02-11 DIAGNOSIS — E119 Type 2 diabetes mellitus without complications: Secondary | ICD-10-CM | POA: Diagnosis not present

## 2019-02-14 ENCOUNTER — Emergency Department (HOSPITAL_COMMUNITY): Payer: Medicare Other

## 2019-02-14 ENCOUNTER — Encounter (HOSPITAL_COMMUNITY): Payer: Self-pay | Admitting: Emergency Medicine

## 2019-02-14 ENCOUNTER — Inpatient Hospital Stay (HOSPITAL_COMMUNITY)
Admission: EM | Admit: 2019-02-14 | Discharge: 2019-02-19 | DRG: 438 | Disposition: A | Discharge status: Hospice | Payer: Medicare Other | Source: Skilled Nursing Facility | Attending: Family Medicine | Admitting: Family Medicine

## 2019-02-14 DIAGNOSIS — G9341 Metabolic encephalopathy: Secondary | ICD-10-CM | POA: Diagnosis present

## 2019-02-14 DIAGNOSIS — K8000 Calculus of gallbladder with acute cholecystitis without obstruction: Secondary | ICD-10-CM | POA: Diagnosis present

## 2019-02-14 DIAGNOSIS — R5381 Other malaise: Secondary | ICD-10-CM | POA: Diagnosis not present

## 2019-02-14 DIAGNOSIS — R279 Unspecified lack of coordination: Secondary | ICD-10-CM | POA: Diagnosis not present

## 2019-02-14 DIAGNOSIS — R0902 Hypoxemia: Secondary | ICD-10-CM | POA: Diagnosis present

## 2019-02-14 DIAGNOSIS — R1010 Upper abdominal pain, unspecified: Secondary | ICD-10-CM | POA: Diagnosis not present

## 2019-02-14 DIAGNOSIS — Z7902 Long term (current) use of antithrombotics/antiplatelets: Secondary | ICD-10-CM

## 2019-02-14 DIAGNOSIS — R1111 Vomiting without nausea: Secondary | ICD-10-CM | POA: Diagnosis not present

## 2019-02-14 DIAGNOSIS — Z79890 Hormone replacement therapy (postmenopausal): Secondary | ICD-10-CM

## 2019-02-14 DIAGNOSIS — Z8249 Family history of ischemic heart disease and other diseases of the circulatory system: Secondary | ICD-10-CM | POA: Diagnosis not present

## 2019-02-14 DIAGNOSIS — M1990 Unspecified osteoarthritis, unspecified site: Secondary | ICD-10-CM | POA: Diagnosis present

## 2019-02-14 DIAGNOSIS — K219 Gastro-esophageal reflux disease without esophagitis: Secondary | ICD-10-CM | POA: Diagnosis present

## 2019-02-14 DIAGNOSIS — E871 Hypo-osmolality and hyponatremia: Secondary | ICD-10-CM | POA: Diagnosis not present

## 2019-02-14 DIAGNOSIS — I728 Aneurysm of other specified arteries: Secondary | ICD-10-CM | POA: Diagnosis present

## 2019-02-14 DIAGNOSIS — E876 Hypokalemia: Secondary | ICD-10-CM | POA: Diagnosis not present

## 2019-02-14 DIAGNOSIS — F0150 Vascular dementia without behavioral disturbance: Secondary | ICD-10-CM | POA: Diagnosis not present

## 2019-02-14 DIAGNOSIS — Z6822 Body mass index (BMI) 22.0-22.9, adult: Secondary | ICD-10-CM | POA: Diagnosis not present

## 2019-02-14 DIAGNOSIS — Z794 Long term (current) use of insulin: Secondary | ICD-10-CM | POA: Diagnosis not present

## 2019-02-14 DIAGNOSIS — E440 Moderate protein-calorie malnutrition: Secondary | ICD-10-CM | POA: Diagnosis present

## 2019-02-14 DIAGNOSIS — G309 Alzheimer's disease, unspecified: Secondary | ICD-10-CM | POA: Diagnosis present

## 2019-02-14 DIAGNOSIS — F0280 Dementia in other diseases classified elsewhere without behavioral disturbance: Secondary | ICD-10-CM | POA: Diagnosis present

## 2019-02-14 DIAGNOSIS — K810 Acute cholecystitis: Secondary | ICD-10-CM | POA: Diagnosis not present

## 2019-02-14 DIAGNOSIS — X58XXXA Exposure to other specified factors, initial encounter: Secondary | ICD-10-CM | POA: Diagnosis present

## 2019-02-14 DIAGNOSIS — E1100 Type 2 diabetes mellitus with hyperosmolarity without nonketotic hyperglycemic-hyperosmolar coma (NKHHC): Secondary | ICD-10-CM | POA: Diagnosis not present

## 2019-02-14 DIAGNOSIS — E1165 Type 2 diabetes mellitus with hyperglycemia: Secondary | ICD-10-CM | POA: Diagnosis present

## 2019-02-14 DIAGNOSIS — Z8673 Personal history of transient ischemic attack (TIA), and cerebral infarction without residual deficits: Secondary | ICD-10-CM | POA: Diagnosis not present

## 2019-02-14 DIAGNOSIS — G311 Senile degeneration of brain, not elsewhere classified: Secondary | ICD-10-CM | POA: Diagnosis not present

## 2019-02-14 DIAGNOSIS — R945 Abnormal results of liver function studies: Secondary | ICD-10-CM | POA: Diagnosis not present

## 2019-02-14 DIAGNOSIS — Z7982 Long term (current) use of aspirin: Secondary | ICD-10-CM

## 2019-02-14 DIAGNOSIS — R509 Fever, unspecified: Secondary | ICD-10-CM

## 2019-02-14 DIAGNOSIS — M810 Age-related osteoporosis without current pathological fracture: Secondary | ICD-10-CM | POA: Diagnosis present

## 2019-02-14 DIAGNOSIS — I10 Essential (primary) hypertension: Secondary | ICD-10-CM | POA: Diagnosis present

## 2019-02-14 DIAGNOSIS — Z803 Family history of malignant neoplasm of breast: Secondary | ICD-10-CM

## 2019-02-14 DIAGNOSIS — Z66 Do not resuscitate: Secondary | ICD-10-CM | POA: Diagnosis not present

## 2019-02-14 DIAGNOSIS — K859 Acute pancreatitis without necrosis or infection, unspecified: Secondary | ICD-10-CM | POA: Diagnosis not present

## 2019-02-14 DIAGNOSIS — E118 Type 2 diabetes mellitus with unspecified complications: Secondary | ICD-10-CM | POA: Diagnosis not present

## 2019-02-14 DIAGNOSIS — F05 Delirium due to known physiological condition: Secondary | ICD-10-CM | POA: Diagnosis present

## 2019-02-14 DIAGNOSIS — K5730 Diverticulosis of large intestine without perforation or abscess without bleeding: Secondary | ICD-10-CM | POA: Diagnosis not present

## 2019-02-14 DIAGNOSIS — I69321 Dysphasia following cerebral infarction: Secondary | ICD-10-CM | POA: Diagnosis not present

## 2019-02-14 DIAGNOSIS — R000 Tachycardia, unspecified: Secondary | ICD-10-CM | POA: Diagnosis not present

## 2019-02-14 DIAGNOSIS — E119 Type 2 diabetes mellitus without complications: Secondary | ICD-10-CM

## 2019-02-14 DIAGNOSIS — R112 Nausea with vomiting, unspecified: Secondary | ICD-10-CM | POA: Diagnosis not present

## 2019-02-14 DIAGNOSIS — K8020 Calculus of gallbladder without cholecystitis without obstruction: Secondary | ICD-10-CM | POA: Diagnosis not present

## 2019-02-14 DIAGNOSIS — I2510 Atherosclerotic heart disease of native coronary artery without angina pectoris: Secondary | ICD-10-CM | POA: Diagnosis present

## 2019-02-14 DIAGNOSIS — Z515 Encounter for palliative care: Secondary | ICD-10-CM | POA: Diagnosis not present

## 2019-02-14 DIAGNOSIS — R262 Difficulty in walking, not elsewhere classified: Secondary | ICD-10-CM | POA: Diagnosis not present

## 2019-02-14 DIAGNOSIS — Z79899 Other long term (current) drug therapy: Secondary | ICD-10-CM

## 2019-02-14 DIAGNOSIS — Z20828 Contact with and (suspected) exposure to other viral communicable diseases: Secondary | ICD-10-CM | POA: Diagnosis present

## 2019-02-14 DIAGNOSIS — Z7189 Other specified counseling: Secondary | ICD-10-CM | POA: Diagnosis not present

## 2019-02-14 DIAGNOSIS — R4182 Altered mental status, unspecified: Secondary | ICD-10-CM | POA: Diagnosis not present

## 2019-02-14 DIAGNOSIS — M4856XA Collapsed vertebra, not elsewhere classified, lumbar region, initial encounter for fracture: Secondary | ICD-10-CM | POA: Diagnosis present

## 2019-02-14 DIAGNOSIS — E039 Hypothyroidism, unspecified: Secondary | ICD-10-CM | POA: Diagnosis present

## 2019-02-14 DIAGNOSIS — Z8781 Personal history of (healed) traumatic fracture: Secondary | ICD-10-CM | POA: Diagnosis not present

## 2019-02-14 DIAGNOSIS — R110 Nausea: Secondary | ICD-10-CM | POA: Diagnosis not present

## 2019-02-14 DIAGNOSIS — R0602 Shortness of breath: Secondary | ICD-10-CM | POA: Diagnosis not present

## 2019-02-14 DIAGNOSIS — R1110 Vomiting, unspecified: Secondary | ICD-10-CM | POA: Diagnosis not present

## 2019-02-14 DIAGNOSIS — J9811 Atelectasis: Secondary | ICD-10-CM | POA: Diagnosis present

## 2019-02-14 DIAGNOSIS — I447 Left bundle-branch block, unspecified: Secondary | ICD-10-CM | POA: Diagnosis present

## 2019-02-14 DIAGNOSIS — R1310 Dysphagia, unspecified: Secondary | ICD-10-CM | POA: Diagnosis not present

## 2019-02-14 DIAGNOSIS — R410 Disorientation, unspecified: Secondary | ICD-10-CM | POA: Diagnosis not present

## 2019-02-14 DIAGNOSIS — Z7401 Bed confinement status: Secondary | ICD-10-CM | POA: Diagnosis not present

## 2019-02-14 DIAGNOSIS — K851 Biliary acute pancreatitis without necrosis or infection: Secondary | ICD-10-CM

## 2019-02-14 DIAGNOSIS — R52 Pain, unspecified: Secondary | ICD-10-CM | POA: Diagnosis not present

## 2019-02-14 LAB — LACTIC ACID, PLASMA
Lactic Acid, Venous: 1.1 mmol/L (ref 0.5–1.9)
Lactic Acid, Venous: 1.8 mmol/L (ref 0.5–1.9)

## 2019-02-14 LAB — COMPREHENSIVE METABOLIC PANEL
ALT: 276 U/L — ABNORMAL HIGH (ref 0–44)
AST: 384 U/L — ABNORMAL HIGH (ref 15–41)
Albumin: 3.4 g/dL — ABNORMAL LOW (ref 3.5–5.0)
Alkaline Phosphatase: 114 U/L (ref 38–126)
Anion gap: 9 (ref 5–15)
BUN: 25 mg/dL — ABNORMAL HIGH (ref 8–23)
CO2: 25 mmol/L (ref 22–32)
Calcium: 8.4 mg/dL — ABNORMAL LOW (ref 8.9–10.3)
Chloride: 105 mmol/L (ref 98–111)
Creatinine, Ser: 0.73 mg/dL (ref 0.44–1.00)
GFR calc Af Amer: >60 mL/min (ref >60)
GFR calc non Af Amer: >60 mL/min (ref >60)
Glucose, Bld: 227 mg/dL — ABNORMAL HIGH (ref 70–99)
Potassium: 3.5 mmol/L (ref 3.5–5.1)
Sodium: 139 mmol/L (ref 135–145)
Total Bilirubin: 1.2 mg/dL (ref 0.3–1.2)
Total Protein: 5.8 g/dL — ABNORMAL LOW (ref 6.5–8.1)

## 2019-02-14 LAB — CBC
HCT: 41.8 % (ref 36.0–46.0)
Hemoglobin: 12.7 g/dL (ref 12.0–15.0)
MCH: 26.8 pg (ref 26.0–34.0)
MCHC: 30.4 g/dL (ref 30.0–36.0)
MCV: 88.4 fL (ref 80.0–100.0)
Platelets: 139 10*3/uL — ABNORMAL LOW (ref 150–400)
RBC: 4.73 MIL/uL (ref 3.87–5.11)
RDW: 15.1 % (ref 11.5–15.5)
WBC: 16.3 10*3/uL — ABNORMAL HIGH (ref 4.0–10.5)
nRBC: 0 % (ref 0.0–0.2)

## 2019-02-14 LAB — LIPASE, BLOOD: Lipase: 3121 U/L — ABNORMAL HIGH (ref 11–51)

## 2019-02-14 LAB — CBG MONITORING, ED: Glucose-Capillary: 186 mg/dL — ABNORMAL HIGH (ref 70–99)

## 2019-02-14 LAB — PROTIME-INR
INR: 1.1 (ref 0.8–1.2)
Prothrombin Time: 14.1 seconds (ref 11.4–15.2)

## 2019-02-14 LAB — TROPONIN I: Troponin I: <0.03 ng/mL (ref <0.03)

## 2019-02-14 LAB — SARS CORONAVIRUS 2 BY RT PCR (HOSPITAL ORDER, PERFORMED IN ~~LOC~~ HOSPITAL LAB): SARS Coronavirus 2: NEGATIVE

## 2019-02-14 LAB — LACTATE DEHYDROGENASE: LDH: 457 U/L — ABNORMAL HIGH (ref 98–192)

## 2019-02-14 MED ORDER — ONDANSETRON HCL 4 MG/2ML IJ SOLN
INTRAMUSCULAR | Status: AC
  Filled 2019-02-14: qty 2

## 2019-02-14 MED ORDER — PIPERACILLIN-TAZOBACTAM 3.375 G IVPB 30 MIN
3.3750 g | Freq: Once | INTRAVENOUS | Status: AC
  Administered 2019-02-14: 3.375 g via INTRAVENOUS
  Filled 2019-02-14: qty 50

## 2019-02-14 MED ORDER — LORAZEPAM 2 MG/ML IJ SOLN
INTRAMUSCULAR | Status: AC
  Administered 2019-02-15: 22:00:00 0.5 mg via INTRAVENOUS
  Filled 2019-02-14: qty 1

## 2019-02-14 MED ORDER — LORAZEPAM 2 MG/ML IJ SOLN: 0.5000 mg | Freq: Once | INTRAMUSCULAR | Status: DC

## 2019-02-14 MED ORDER — IOHEXOL 300 MG/ML  SOLN
100.0000 mL | Freq: Once | INTRAMUSCULAR | Status: AC | PRN
  Administered 2019-02-14: 100 mL via INTRAVENOUS

## 2019-02-14 MED ORDER — SODIUM CHLORIDE 0.9 % IV SOLN
INTRAVENOUS | Status: DC
  Administered 2019-02-14 – 2019-02-15 (×2): via INTRAVENOUS

## 2019-02-14 MED ORDER — LORAZEPAM 2 MG/ML IJ SOLN
0.5000 mg | Freq: Once | INTRAMUSCULAR | Status: AC
  Administered 2019-02-14: 0.5 mg via INTRAVENOUS

## 2019-02-14 MED ORDER — INSULIN ASPART 100 UNIT/ML ~~LOC~~ SOLN
0.0000 [IU] | SUBCUTANEOUS | Status: DC
  Administered 2019-02-15: 2 [IU] via SUBCUTANEOUS
  Administered 2019-02-15: 1 [IU] via SUBCUTANEOUS
  Administered 2019-02-15: 3 [IU] via SUBCUTANEOUS
  Administered 2019-02-17: 17:00:00 2 [IU] via SUBCUTANEOUS
  Administered 2019-02-17 (×2): 1 [IU] via SUBCUTANEOUS
  Administered 2019-02-17: 20:00:00 2 [IU] via SUBCUTANEOUS
  Administered 2019-02-18 (×4): 1 [IU] via SUBCUTANEOUS
  Administered 2019-02-18: 04:00:00 2 [IU] via SUBCUTANEOUS
  Administered 2019-02-18 – 2019-02-19 (×3): 1 [IU] via SUBCUTANEOUS

## 2019-02-14 MED ORDER — SODIUM CHLORIDE 0.9 % IV SOLN
Freq: Once | INTRAVENOUS | Status: AC
  Administered 2019-02-14: 250 mL via INTRAVENOUS

## 2019-02-14 MED ORDER — HYDROMORPHONE HCL 1 MG/ML IJ SOLN
0.5000 mg | Freq: Once | INTRAMUSCULAR | Status: DC
  Filled 2019-02-14: qty 1

## 2019-02-14 NOTE — ED Notes (Signed)
From CT 

## 2019-02-14 NOTE — ED Provider Notes (Signed)
[facility] EMERGENCY DEPARTMENT Provider Note   CSN: [id number] Arrival date & time: 02/14/19  1934    History   Chief Complaint Chief Complaint  Patient presents with  . Emesis    HPI Kristin Grant is a 78 y.o. female with history of CAD, Alzheimer's disease, arthritis, diabetes mellitus, hypertension, GERD, CVA presents for evaluation of acute onset, progressively worsening abdominal pain with nausea and vomiting.  She reports that symptoms began earlier today.  Pain is mostly localized to the upper abdomen.  She has had 3-4 episodes of nonbloody nonbilious emesis.  Denies diarrhea or constipation.  No urinary symptoms, melena, or hematochezia.  No fevers or chills.  She does feel short of breath but denies chest pain.  She appears ill and uncomfortable.  She is oriented to person, place, and the year.      Emesis Associated symptoms: abdominal pain   Associated symptoms: no chills, no diarrhea and no fever     Past Medical History:  Diagnosis Date  . Alzheimer disease ([place])   . Arthritis   . Coronary artery disease   . Diabetes mellitus   . Difficulty walking   . Hypertension   . Reflux   . Stroke [facility])     Patient Active Problem List   Diagnosis Date Noted  . Intertrochanteric fracture of left hip ([place]) 12/04/2018  . Intertrochanteric fracture of left hip, closed, initial encounter ([place]) 12/04/2018  . Acute blood loss anemia 08/05/2018  . Closed intertrochanteric fracture of hip, right, initial encounter ([place]) 08/03/2018  . Malnutrition of moderate degree 08/03/2018  . Coronary artery disease 11/17/2017  . Thrombocytopenia ([place]) 11/17/2017  . Cerebral atrophy ([place]) 11/17/2017  . Hypokalemia 11/17/2017  . History of CVA (cerebrovascular accident) 11/15/2017  . Suspected stroke patient last known to be well 3 to 4.5 hours ago   . Closed fracture of proximal end of right humerus 09/18/17 10/16/2017  . Osteoporosis 01/09/2017  . Loss of weight 01/12/2016  .  Stroke-like episode ([place]) s/p IV tPA 11/22/2015  . Diabetes mellitus, type II ([place]) 06/22/2014  . Protein-calorie malnutrition ([place]) 12/15/2013  . Fall 08/17/2013  . Back pain 08/17/2013  . Abnormal development of nail 03/28/2013  . Gait abnormality 12/10/2011  . Dementia ([place]) 11/21/2011  . Hypertension 11/21/2011  . Hypothyroidism 11/21/2011    Past Surgical History:  Procedure Laterality Date  . APPENDECTOMY    . FINGER DEBRIDEMENT    . INTRAMEDULLARY (IM) NAIL INTERTROCHANTERIC Right 08/03/2018   Procedure: INTRAMEDULLARY (IM) NAIL INTERTROCHANTRIC;  Surgeon: Marybelle Killings, MD;  Location: [place];  Service: Orthopedics;  Laterality: Right;  . INTRAMEDULLARY (IM) NAIL INTERTROCHANTERIC Left 12/04/2018   Procedure: INTRAMEDULLARY (IM) NAIL INTERTROCHANTRIC;  Surgeon: Marybelle Killings, MD;  Location: [place];  Service: Orthopedics;  Laterality: Left;     OB History   No obstetric history on file.      Home Medications    Prior to Admission medications   Medication Sig Start Date End Date Taking? Authorizing Provider  albuterol (VENTOLIN HFA) 108 (90 Base) MCG/ACT inhaler Inhale 2 puffs into the lungs every 6 (six) hours as needed for wheezing or shortness of breath. 02/05/19  Yes [place], Modena Nunnery, MD  aspirin EC 81 MG tablet Take 1 tablet (81 mg total) by mouth daily. Patient taking differently: Take 325 mg by mouth daily.  02/05/19  Yes [place], Modena Nunnery, MD  clopidogrel (PLAVIX) 75 MG tablet TAKE 1 TABLET (42m) BY MOUTH ONCE DAILY. Patient taking differently:  Take 75 mg by mouth daily.  02/05/19  Yes [place], Modena Nunnery, MD  Ferrous Sulfate (IRON) 325 (65 Fe) MG TABS Take 1 tablet (325 mg total) by mouth daily. 02/05/19  Yes [place], Modena Nunnery, MD  insulin glargine (LANTUS) 100 UNIT/ML injection Inject 14 Units into the skin at bedtime.   Yes [provider]  insulin lispro (HUMALOG KWIKPEN) 100 UNIT/ML KwikPen Inject 0.06 mLs (6 Units total) into the skin 2 (two) times daily  with a meal. Lunch and dinner. 02/05/19  Yes [place], Modena Nunnery, MD  levothyroxine (SYNTHROID) 50 MCG tablet Take 1 tablet (50 mcg total) by mouth daily. 02/05/19  Yes [place], Modena Nunnery, MD  linagliptin (TRADJENTA) 5 MG TABS tablet Take 1 tablet (5 mg total) by mouth daily. 02/05/19  Yes [place], Modena Nunnery, MD  lisinopril (ZESTRIL) 40 MG tablet Take 1 tablet (40 mg total) by mouth daily. 02/05/19  Yes [place], Modena Nunnery, MD  mirtazapine (REMERON) 7.5 MG tablet Take 1 tablet (7.5 mg total) by mouth at bedtime. 02/05/19  Yes [place], Modena Nunnery, MD  Multiple Vitamin (DAILY VITE) TABS Take 1 tablet by mouth daily. 02/05/19  Yes [place], Modena Nunnery, MD  propranolol (INDERAL) 40 MG tablet Take 1 tablet (40 mg total) by mouth 2 (two) times daily. 02/05/19  Yes , Modena Nunnery, MD  NOVOFINE AUTOCOVER 30G X 8 MM MISC USE AS DIRECTED FOR INSULIN ADMINISTRATION 3 TIMES DAILY. Patient not taking: Reported on 02/14/2019 11/23/18   [name], MD  traMADol (ULTRAM) 50 MG tablet Take 1 tablet (50 mg total) by mouth every 6 (six) hours as needed for moderate pain. Patient not taking: Reported on 02/14/2019 02/05/19   [name], MD    Family History Family History  Problem Relation Age of Onset  . Hypertension Mother   . Heart disease Mother   . Hypertension Father   . Heart disease Father   . Hypertension Sister   . Heart disease Sister   . Cancer Sister        BREAST   . Hypertension Brother   . Heart disease Brother     Social History Social History   Tobacco Use  . Smoking status: Never Smoker  . Smokeless tobacco: Never Used  Substance Use Topics  . Alcohol use: No  . Drug use: No     Allergies   Patient has no known allergies.   Review of Systems Review of Systems  Constitutional: Negative for chills and fever.  Respiratory: Positive for shortness of breath.   Cardiovascular: Negative for chest pain.  Gastrointestinal: Positive for abdominal pain, nausea and vomiting. Negative  for constipation and diarrhea.  All other systems reviewed and are negative.    Physical Exam Updated Vital Signs BP (!) 138/57   Pulse 90   Temp 97.9 F (36.6 C) (Oral)   Ht _0  (1.626 m)   SpO2 98%   BMI 22.66 kg/m   Physical Exam Vitals signs and nursing note reviewed.  Constitutional:      General: She is not in acute distress.    Appearance: She is well-developed.     Comments: Sitting upright in bed, appears uncomfortable  HENT:     Head: Normocephalic and atraumatic.  Eyes:     General:        Right eye: No discharge.        Left eye: No discharge.     Conjunctiva/sclera: Conjunctivae normal.  Neck:     Vascular:  No JVD.     Trachea: No tracheal deviation.  Cardiovascular:     Rate and Rhythm: Normal rate and regular rhythm.  Pulmonary:     Effort: Pulmonary effort is normal.     Comments: Soft bibasilar crackles noted. Abdominal:     General: Abdomen is protuberant. Bowel sounds are increased. There is no distension.     Palpations: Abdomen is soft.     Tenderness: There is abdominal tenderness in the right upper quadrant, epigastric area and left upper quadrant. There is no right CVA tenderness or left CVA tenderness. Negative signs include Murphy's sign.  Musculoskeletal:     Comments: kyphotic  Skin:    General: Skin is warm and dry.     Findings: No erythema.  Neurological:     Mental Status: She is alert.  Psychiatric:        Behavior: Behavior normal.      ED Treatments / Results  Labs (all labs ordered are listed, but only abnormal results are displayed) Labs Reviewed  COMPREHENSIVE METABOLIC PANEL - Abnormal; Notable for the following components:      Result Value   Glucose, Bld 227 (*)    BUN 25 (*)    Calcium 8.4 (*)    Total Protein 5.8 (*)    Albumin 3.4 (*)    AST 384 (*)    ALT 276 (*)    All other components within normal limits  CBC - Abnormal; Notable for the following components:   WBC 16.3 (*)    Platelets 139 (*)     All other components within normal limits  LIPASE, BLOOD - Abnormal; Notable for the following components:   Lipase 3,121 (*)    All other components within normal limits  CBG MONITORING, ED - Abnormal; Notable for the following components:   Glucose-Capillary 186 (*)    All other components within normal limits  SARS CORONAVIRUS 2 (HOSPITAL ORDER, [place] [facility])  PROTIME-INR  TROPONIN I  LACTIC ACID, PLASMA  LACTIC ACID, PLASMA  URINALYSIS, ROUTINE W REFLEX MICROSCOPIC    EKG EKG Interpretation  Date/Time:  Sunday February 14 2019 19:42:56 EDT Ventricular Rate:  62 PR Interval:    QRS Duration: 78 QT Interval:  513 QTC Calculation: 521 R Axis:   25 Text Interpretation:  Sinus rhythm Consider left ventricular hypertrophy Abnormal T, consider ischemia, anterior leads Prolonged QT interval Baseline wander in lead(s) II Confirmed by Fredia Sorrow [phone]) on 02/14/2019 8:16:50 PM   Radiology Ct Abdomen Pelvis W Contrast  Result Date: 02/14/2019 CLINICAL DATA:  Vomiting. Confusion. Upper abdominal tenderness. EXAM: CT ABDOMEN AND PELVIS WITH CONTRAST TECHNIQUE: Multidetector CT imaging of the abdomen and pelvis was performed using the standard protocol following bolus administration of intravenous contrast. CONTRAST:  160m OMNIPAQUE IOHEXOL 300 MG/ML  SOLN COMPARISON:  None. FINDINGS: Lower chest: 6 mm perifissural nodule in the right lower lobe. Bilateral lower lobe atelectasis, left greater than right. There are coronary artery calcifications. Mitral annulus calcifications. Hepatobiliary: No focal hepatic abnormality. Mild periportal edema. Distended gallbladder. Pericholecystic haziness with possible gallbladder wall thickening no calcified gallstone. Common bile duct measures 7 mm. Pancreas: Mild peripancreatic edema about the head versus motion artifact. No pancreatic ductal dilatation. Spleen: Elongated spanning 15 cm cranial caudal. Peripherally calcified  splenic artery aneurysm at the hilum measures 10 mm. Questionable additional splenic artery aneurysm is inferiorly as well as adjacent to the larger lesion. No suspicious splenic lesion. Adrenals/Urinary Tract: Normal adrenal glands. No  hydronephrosis or perinephric edema. Homogeneous renal enhancement with symmetric excretion on delayed phase imaging. Mild lobular renal contours bilaterally. Urinary bladder is partially distended, there is mild bladder wall thickening. Stomach/Bowel: Nondistended stomach. No small bowel obstruction or inflammation. Appendix not visualized, history of appendectomy. Diverticulosis in sigmoid colon without diverticulitis. Stool within the rectum with mild rectal wall thickening, small volume of colonic stool elsewhere. Vascular/Lymphatic: Aorto bi-iliac atherosclerosis. No aneurysm. No adenopathy. Reproductive: Uterus and bilateral adnexa are unremarkable. Other: No free air, free fluid, or intra-abdominal fluid collection. Musculoskeletal: Severe L1 compression fracture with posterior cortical buckling, chronic based on lumbar spine radiographs from 2019. Remote bilateral superior and inferior pubic rami fractures. Findings suspicious for remote right possibly left sacral insufficiency fractures. Bones are significantly under mineralized. Surgical fixation of bilateral proximal femurs. IMPRESSION: 1. Distended gallbladder with pericholecystic haziness and possible gallbladder wall thickening. Findings suspicious for acute cholecystitis. Recommend further evaluation with right upper quadrant ultrasound. 2. Mild peripancreatic edema about the head of the pancreas versus motion artifact. Recommend correlation with pancreatic enzymes to exclude acute pancreatitis. 3. 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 Recommend annual cross-sectional imaging surveillance, with consideration given to patient's age and demographics. This recommendation follows ACR consensus guidelines:  White Paper of the ACR Incidental Findings Committee II on Vascular Findings. J Am Coll Radiol 2013;10:789-794. 4. Mild urinary bladder wall thickening, recommend correlation with urinalysis to exclude urinary tract infection. 5. Sigmoid colonic diverticulosis without diverticulitis. 6. Right lower lobe 6 mm perifissural nodule. Non-contrast chest CT at 6-12 months is recommended. If the nodule is stable at time of repeat CT, then future CT at 18-24 months (from today's scan) is considered optional for low-risk patients, but is recommended for high-risk patients. This recommendation follows the consensus statement: Guidelines for Management of Incidental Pulmonary Nodules Detected on CT Images: From the Fleischner Society 2017; Radiology 2017; 284:228-243. 7.  Aortic Atherosclerosis (ICD10-I70.0). Electronically Signed   By: Keith Rake M.D.   On: 02/14/2019 22:04   Dg Chest Portable 1 View  Result Date: 02/14/2019 CLINICAL DATA:  Shortness of breath, emesis. EXAM: PORTABLE CHEST 1 VIEW COMPARISON:  12/05/2018 FINDINGS: Cardiomediastinal silhouette is normal. Mediastinal contours appear intact. Calcific atherosclerotic disease of the aorta. There is no evidence of focal airspace consolidation, pleural effusion or pneumothorax. Osseous structures are without acute abnormality. Soft tissues are grossly normal. IMPRESSION: No active disease. Electronically Signed   By: Fidela Salisbury M.D.   On: 02/14/2019 20:34    Procedures Procedures (including critical care time)  Medications Ordered in ED Medications  HYDROmorphone (DILAUDID) injection 0.5 mg (has no administration in time range)  piperacillin-tazobactam (ZOSYN) IVPB 3.375 g (has no administration in time range)  LORazepam (ATIVAN) injection 0.5 mg (0.5 mg Intravenous Given 02/14/19 1958)  0.9 %  sodium chloride infusion (250 mLs Intravenous New Bag/Given 02/14/19 2002)  iohexol (OMNIPAQUE) 300 MG/ML solution 100 mL (100 mLs Intravenous  Contrast Given 02/14/19 2057)     Initial Impression / Assessment and Plan / ED Course  I have reviewed the triage vital signs and the nursing notes.  Pertinent labs & imaging results that were available during my care of the patient were reviewed by me and considered in my medical decision making (see chart for details).        Presents for evaluation of upper abdominal pain with nausea and vomiting.  Symptoms began today.  She is afebrile, vital signs are stable.  She is uncomfortable and somewhat ill in appearance.  EKG  shows T wave inversions in leads V1 through V4 which appear new compared to most recent tracing.  However, she has no complaints of chest pain and her troponin is negative.  She does have a prolonged QT so she was given Ativan for her nausea with some improvement.  Lab work reviewed by myself significant for leukocytosis of 16.3, elevation in her LFTs.  AST is 384, ALT 276.  Alk phos and T bili are within normal limits.  Her lipase is markedly elevated at 3121.  Lactate is negative, and she does not appear septic at this time.  COVID test and UA are pending.  Chest x-ray shows no acute cardiopulmonary abnormalities.  However, her CT scan shows evidence of acute cholecystitis and coupled with her elevated LFTs she has signs of acute pancreatitis.  Possible UTI seen on CT scan, will obtain UA to further assess for this. Will give zosyn for intrabadominal infection. Spoke with Dr. Arnoldo Morale with general surgery in consultation.  He recommends hospitalist admission and general surgery will plan to see the patient in the morning.  Ultrasound unavailable at this time, but she can get this in the morning. Spoke with Dr. Maudie Mercury with [facility] hospitalist service who agrees to assume care of patient and bring her into the hospital for further evaluation and management.  Patient was seen and evaluate by Dr. Rogene Houston in the ED who agrees with assessment and plan at this time.  Final Clinical  Impressions(s) / ED Diagnoses   Final diagnoses:  Acute biliary pancreatitis, unspecified complication status  Acute cholecystitis    ED Discharge Orders    None       Renita Papa, PA-C 02/14/19 2240    Fredia Sorrow, MD 02/15/19 0001

## 2019-02-14 NOTE — ED Notes (Addendum)
Call to [facility]   Per Vee, her caregiver, pt ate dinner at about 1630  Vomiting x 3 since then  No meds for emesis  Sent her er for eval

## 2019-02-14 NOTE — ED Triage Notes (Signed)
Sent from [facility] for emesis

## 2019-02-14 NOTE — ED Notes (Signed)
Sent from [facility] after having vomiting since 1500 today    Pt has hx of diabetes as well as Alzheimer's   She is confused  Tender to upper abd in R/L UQ

## 2019-02-14 NOTE — ED Notes (Signed)
Awaiting results

## 2019-02-15 ENCOUNTER — Inpatient Hospital Stay (HOSPITAL_COMMUNITY): Payer: Medicare Other

## 2019-02-15 DIAGNOSIS — R945 Abnormal results of liver function studies: Secondary | ICD-10-CM

## 2019-02-15 DIAGNOSIS — K851 Biliary acute pancreatitis without necrosis or infection: Principal | ICD-10-CM

## 2019-02-15 LAB — LIPASE, BLOOD: Lipase: 1537 U/L — ABNORMAL HIGH (ref 11–51)

## 2019-02-15 LAB — TROPONIN I
Troponin I: <0.03 ng/mL (ref <0.03)
Troponin I: <0.03 ng/mL (ref <0.03)
Troponin I: <0.03 ng/mL (ref <0.03)

## 2019-02-15 LAB — BLOOD GAS, ARTERIAL
Acid-Base Excess: 5.5 mmol/L — ABNORMAL HIGH (ref 0.0–2.0)
Bicarbonate: 29.2 mmol/L — ABNORMAL HIGH (ref 20.0–28.0)
FIO2: 28
O2 Saturation: 98.2 %
Patient temperature: 37
pCO2 arterial: 43.6 mmHg (ref 32.0–48.0)
pH, Arterial: 7.445 (ref 7.350–7.450)
pO2, Arterial: 115 mmHg — ABNORMAL HIGH (ref 83.0–108.0)

## 2019-02-15 LAB — COMPREHENSIVE METABOLIC PANEL
ALT: 432 U/L — ABNORMAL HIGH (ref 0–44)
AST: 406 U/L — ABNORMAL HIGH (ref 15–41)
Albumin: 3.2 g/dL — ABNORMAL LOW (ref 3.5–5.0)
Alkaline Phosphatase: 133 U/L — ABNORMAL HIGH (ref 38–126)
Anion gap: 11 (ref 5–15)
BUN: 25 mg/dL — ABNORMAL HIGH (ref 8–23)
CO2: 27 mmol/L (ref 22–32)
Calcium: 8.3 mg/dL — ABNORMAL LOW (ref 8.9–10.3)
Chloride: 105 mmol/L (ref 98–111)
Creatinine, Ser: 0.69 mg/dL (ref 0.44–1.00)
GFR calc Af Amer: >60 mL/min (ref >60)
GFR calc non Af Amer: >60 mL/min (ref >60)
Glucose, Bld: 187 mg/dL — ABNORMAL HIGH (ref 70–99)
Potassium: 3.2 mmol/L — ABNORMAL LOW (ref 3.5–5.1)
Sodium: 143 mmol/L (ref 135–145)
Total Bilirubin: 1 mg/dL (ref 0.3–1.2)
Total Protein: 5.5 g/dL — ABNORMAL LOW (ref 6.5–8.1)

## 2019-02-15 LAB — GLUCOSE, CAPILLARY
Glucose-Capillary: 217 mg/dL — ABNORMAL HIGH (ref 70–99)
Glucose-Capillary: 129 mg/dL — ABNORMAL HIGH (ref 70–99)
Glucose-Capillary: 103 mg/dL — ABNORMAL HIGH (ref 70–99)
Glucose-Capillary: 95 mg/dL (ref 70–99)
Glucose-Capillary: 74 mg/dL (ref 70–99)

## 2019-02-15 LAB — CBC
HCT: 37.8 % (ref 36.0–46.0)
Hemoglobin: 11.8 g/dL — ABNORMAL LOW (ref 12.0–15.0)
MCH: 27.4 pg (ref 26.0–34.0)
MCHC: 31.2 g/dL (ref 30.0–36.0)
MCV: 87.9 fL (ref 80.0–100.0)
Platelets: 110 10*3/uL — ABNORMAL LOW (ref 150–400)
RBC: 4.3 MIL/uL (ref 3.87–5.11)
RDW: 15.3 % (ref 11.5–15.5)
WBC: 8.3 10*3/uL (ref 4.0–10.5)
nRBC: 0 % (ref 0.0–0.2)

## 2019-02-15 LAB — CK TOTAL AND CKMB (NOT AT ARMC)
CK, MB: 0.5 ng/mL (ref 0.5–5.0)
Relative Index: INVALID (ref 0.0–2.5)
Total CK: 11 U/L — ABNORMAL LOW (ref 38–234)

## 2019-02-15 LAB — LIPID PANEL
Cholesterol: 96 mg/dL (ref 0–200)
HDL: 42 mg/dL (ref >40)
LDL Cholesterol: 47 mg/dL (ref 0–99)
Total CHOL/HDL Ratio: 2.3 RATIO
Triglycerides: 36 mg/dL (ref <150)
VLDL: 7 mg/dL (ref 0–40)

## 2019-02-15 LAB — D-DIMER, QUANTITATIVE: D-Dimer, Quant: 1.26 ug/mL-FEU — ABNORMAL HIGH (ref 0.00–0.50)

## 2019-02-15 MED ORDER — MORPHINE SULFATE (PF) 2 MG/ML IV SOLN: 0.5000 mg | Freq: Four times a day (QID) | INTRAVENOUS | Status: DC | PRN

## 2019-02-15 MED ORDER — ALBUTEROL SULFATE (2.5 MG/3ML) 0.083% IN NEBU: 3.0000 mL | INHALATION_SOLUTION | Freq: Four times a day (QID) | RESPIRATORY_TRACT | Status: DC | PRN

## 2019-02-15 MED ORDER — ASPIRIN EC 325 MG PO TBEC: 325.0000 mg | DELAYED_RELEASE_TABLET | Freq: Every day | ORAL | Status: DC

## 2019-02-15 MED ORDER — DEXTROSE-NACL 5-0.9 % IV SOLN
INTRAVENOUS | Status: AC
  Administered 2019-02-15 – 2019-02-16 (×2): via INTRAVENOUS

## 2019-02-15 MED ORDER — SODIUM CHLORIDE 0.9 % IV SOLN
INTRAVENOUS | Status: AC
  Administered 2019-02-15 (×2): via INTRAVENOUS

## 2019-02-15 MED ORDER — LORAZEPAM 2 MG/ML IJ SOLN
0.5000 mg | Freq: Once | INTRAMUSCULAR | Status: AC
  Administered 2019-02-15: 22:00:00 0.5 mg via INTRAVENOUS
  Filled 2019-02-15: qty 1

## 2019-02-15 MED ORDER — LACTATED RINGERS IV SOLN
INTRAVENOUS | Status: DC
  Administered 2019-02-15: 10:00:00 via INTRAVENOUS

## 2019-02-15 MED ORDER — SODIUM CHLORIDE 0.9 % IV SOLN
INTRAVENOUS | Status: DC
  Administered 2019-02-15 – 2019-02-17 (×4): via INTRAVENOUS

## 2019-02-15 MED ORDER — MORPHINE SULFATE (PF) 2 MG/ML IV SOLN
2.0000 mg | INTRAVENOUS | Status: DC | PRN
  Administered 2019-02-16 – 2019-02-19 (×8): 2 mg via INTRAVENOUS
  Filled 2019-02-15 (×9): qty 1

## 2019-02-15 MED ORDER — IOHEXOL 350 MG/ML SOLN
100.0000 mL | Freq: Once | INTRAVENOUS | Status: AC | PRN
  Administered 2019-02-15: 10:00:00 75 mL via INTRAVENOUS

## 2019-02-15 MED ORDER — ONDANSETRON HCL 4 MG/2ML IJ SOLN
4.0000 mg | Freq: Four times a day (QID) | INTRAMUSCULAR | Status: DC | PRN
  Filled 2019-02-15: qty 2

## 2019-02-15 MED ORDER — PIPERACILLIN-TAZOBACTAM 3.375 G IVPB
3.3750 g | Freq: Three times a day (TID) | INTRAVENOUS | Status: DC
  Administered 2019-02-15 – 2019-02-19 (×13): 3.375 g via INTRAVENOUS
  Filled 2019-02-15 (×13): qty 50

## 2019-02-15 MED ORDER — METOPROLOL TARTRATE 5 MG/5ML IV SOLN
2.5000 mg | Freq: Four times a day (QID) | INTRAVENOUS | Status: DC
  Administered 2019-02-15 – 2019-02-18 (×12): 2.5 mg via INTRAVENOUS
  Filled 2019-02-15 (×13): qty 5

## 2019-02-15 MED ORDER — ASPIRIN EC 81 MG PO TBEC
81.0000 mg | DELAYED_RELEASE_TABLET | Freq: Every day | ORAL | Status: DC
  Administered 2019-02-15 – 2019-02-16 (×2): 81 mg via ORAL
  Filled 2019-02-15 (×2): qty 1

## 2019-02-15 MED ORDER — LEVOTHYROXINE SODIUM 50 MCG PO TABS
50.0000 ug | ORAL_TABLET | Freq: Every day | ORAL | Status: DC
  Administered 2019-02-16: 05:00:00 50 ug via ORAL
  Filled 2019-02-15: qty 1

## 2019-02-15 NOTE — Progress Notes (Signed)
[facility] Progress Note  Patient: Kristin Grant WUJ:[id number]   PCP: Salley Scarleturham, Kaw[place] F, MD DOB: 16-Aug-1941   DOA: 02/14/2019   DOS: 02/15/2019   Date of Service: the patient was seen and examined on 02/15/2019  Brief hospital course: Pt. 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 Currently further plan is continue conservative measures.  Subjective: Continues to have nausea.  No vomiting.  No abdominal pain.  Not passing gas.  Assessment and Plan: Acute pancreatitis ? Gallstone NPO, hydrate with ns iv RUQ ultrasound pending Morphine sulfate  Zofran 4mg  iv q6h prn n/v Zosyn iv pharmacy to dose to cover for possible cholecystitis Appreciate GI and general surgery consultation.  Abnormal liver function RUQ ultrasound pending acute hepatitis panel pending STOP Mirtazapine as this can affect liver function  Type 2 diabetes mellitus, controlled.  No complication. STOP Tradjenta => associated with pancreatitis STOP Lantus, Novolog fsbs q4h, ISS  H/o CVA Cont aspirin HOLD Plavix for now, if not going to have surgery for gallstone pancreatitis  Dementia (baseline ambulates with walker) STOP mirtazepine as above  Hypothyroidism Cont Levothyroxine 50 micrograms po qday  Hypertension STOP Lisinopril STOP Propranolol Metoprolol 2.5mg  iv q6h (hold sbp <100, hr <60  Moderate protein calorie malnutrition Please address after pancreatitis resolves  Severe compression fracture L1 Check vitamin D level Please obtain bone density test as outpatient Will need calcium and vitamin D and antiresorptive agent Would avoid Bisphosphonate due to hx of GERD, probably would benefit from Prolia 60mg  [place] q6 months   Right Lower Lung 6mm nodule on CT scan Please repeat CT chest  noncontrast in 6-12 months  Splenic artery aneurysm Please curbside vascular regarding whether needs follow up in AM  Diet: NPO  DVT Prophylaxis: SCD, pharmacological prophylaxis contraindicated due to Pending procedure  Advance goals of care discussion: Full code  Family Communication: no family was present at bedside, at the time of interview.   Disposition:  Discharge to Home .  Consultants: gastroenterology General surgery  Procedures: none  Scheduled Meds:  aspirin EC  81 mg Oral Daily   insulin aspart  0-9 Units Subcutaneous Q4H   metoprolol tartrate  2.5 mg Intravenous Q6H   Continuous Infusions:  sodium chloride 250 mL/hr at 02/15/19 1725   Followed by   sodium chloride     piperacillin-tazobactam (ZOSYN)  IV 3.375 g (02/15/19 1531)   PRN Meds: morphine injection, ondansetron (ZOFRAN) IV Antibiotics: Anti-infectives (From admission, onward)   Start     Dose/Rate Route Frequency Ordered Stop   02/15/19 0800  piperacillin-tazobactam (ZOSYN) IVPB 3.375 g     3.375 g 12.5 mL/hr over 240 Minutes Intravenous Every 8 hours 02/15/19 0116     02/14/19 2230  piperacillin-tazobactam (ZOSYN) IVPB 3.375 g     3.375 g 100 mL/hr over 30 Minutes Intravenous  Once 02/14/19 2223 02/14/19 2350       Objective: Physical Exam: Vitals:   02/15/19 0105 02/15/19 0600 02/15/19 1200 02/15/19 1555  BP: (!) 112/49 122/69 (!) 124/49 (!) 114/93  Pulse: (!) 103 74 70 96  Resp: 16 18  16   Temp: 98.3 F (36.8 C) 97.7 F (36.5 C)  99.6 F (37.6 C)  TempSrc:  Oral  Oral  SpO2: (!) 88% 100%  95%  Weight: 58.1 kg 58.1 kg    Height: 5\' 6"  (  1.676 m)       Intake/Output Summary (Last 24 hours) at 02/15/2019 1759 Last data filed at 02/15/2019 1500 Gross per 24 hour  Intake 1308.92 ml  Output --  Net 1308.92 ml   Filed Weights   02/15/19 0105 02/15/19 0600  Weight: 58.1 kg 58.1 kg   General: alert and oriented to time, place, and person. Appear in mild distress, affect  appropriate Eyes: PERRL, Conjunctiva normal ENT: Oral Mucosa Clear, moist  Neck: no JVD, no Abnormal Mass Or lumps Cardiovascular: S1 and S2 Present, no Murmur, peripheral pulses symmetrical Respiratory: normal respiratory effort, Bilateral Air entry equal and Decreased, no use of accessory muscle, Clear to Auscultation, no Crackles, no wheezes Abdomen: Bowel Sound present, Soft and mild tenderness, no hernia Skin: no rashes  Extremities: no Pedal edema, no calf tenderness Neurologic: normal without focal findings, mental status, speech normal, alert and oriented x3, PERLA, Motor strength 5/5 and symmetric and sensation grossly normal to light touch Gait not checked due to patient safety concerns  Data Reviewed: CBC: Recent Labs  Lab 02/14/19 2004 02/15/19 0416  WBC 16.3* 8.3  HGB 12.7 11.8*  HCT 41.8 37.8  MCV 88.4 87.9  PLT 139* 284*   Basic Metabolic Panel: Recent Labs  Lab 02/14/19 2004 02/15/19 0416  NA 139 143  K 3.5 3.2*  CL 105 105  CO2 25 27  GLUCOSE 227* 187*  BUN 25* 25*  CREATININE 0.73 0.69  CALCIUM 8.4* 8.3*    Liver Function Tests: Recent Labs  Lab 02/14/19 2004 02/15/19 0416  AST 384* 406*  ALT 276* 432*  ALKPHOS 114 133*  BILITOT 1.2 1.0  PROT 5.8* 5.5*  ALBUMIN 3.4* 3.2*   Recent Labs  Lab 02/14/19 2004 02/15/19 0416  LIPASE 3,121* 1,537*   No results for input(s): AMMONIA in the last 168 hours. Coagulation Profile: Recent Labs  Lab 02/14/19 2004  INR 1.1   Cardiac Enzymes: Recent Labs  Lab 02/14/19 2004 02/15/19 0416 02/15/19 1123 02/15/19 1643  CKTOTAL  --  11*  --   --   CKMB  --  0.5  --   --   TROPONINI <0.03 <0.03 <0.03 <0.03   BNP (last 3 results) No results for input(s): PROBNP in the last 8760 hours. CBG: Recent Labs  Lab 02/14/19 1953 02/15/19 0213 02/15/19 0721 02/15/19 1106 02/15/19 1637  GLUCAP 186* 217* 129* 103* 95   Studies: Ct Head Wo Contrast  Result Date: 02/15/2019 CLINICAL DATA:  Altered  mental status. EXAM: CT HEAD WITHOUT CONTRAST TECHNIQUE: Contiguous axial images were obtained from the base of the skull through the vertex without intravenous contrast. COMPARISON:  12/06/2018. FINDINGS: Brain: No evidence for acute infarction, hemorrhage, mass lesion, hydrocephalus, or extra-axial fluid. Generalized atrophy. Hypoattenuation of white matter, likely small vessel disease. Vascular: Calcification of the cavernous internal carotid arteries and distal vertebral arteries, consistent with cerebrovascular atherosclerotic disease. No signs of intracranial large vessel occlusion. Skull: Calvarium intact. Sinuses/Orbits: Chronic RIGHT maxillary sinusitis. Negative orbits. Other: None. IMPRESSION: Atrophy and small vessel disease. No acute intracranial findings. Electronically Signed   By: Staci Righter M.D.   On: 02/15/2019 10:04   Ct Angio Chest Pe W Or Wo Contrast  Result Date: 02/15/2019 CLINICAL DATA:  Tachycardia, hypoxia EXAM: CT ANGIOGRAPHY CHEST WITH CONTRAST TECHNIQUE: Multidetector CT imaging of the chest was performed using the standard protocol during bolus administration of intravenous contrast. Multiplanar CT image reconstructions and MIPs were obtained to evaluate the vascular anatomy. CONTRAST:  38mL  OMNIPAQUE IOHEXOL 350 MG/ML SOLN COMPARISON:  None. FINDINGS: Cardiovascular: Satisfactory opacification of the pulmonary arteries to the segmental level. No evidence of pulmonary embolism. Normal heart size. No pericardial effusion. Thoracic aortic atherosclerosis. Multi vessel coronary artery atherosclerosis. Mediastinum/Nodes: No enlarged mediastinal, hilar, or axillary lymph nodes. Thyroid gland, trachea, and esophagus demonstrate no significant findings. Lungs/Pleura: Bibasilar at airspace disease likely reflecting atelectasis. No pleural effusion or pneumothorax. No other focal parenchymal airspace disease. Upper Abdomen: No acute abnormality. Musculoskeletal: No acute osseous  abnormality. No aggressive osseous lesion. T9 vertebral body sclerotic bone lesion most consistent with a bone island. Chronic L1 vertebral body compression fracture. Mild osteoarthritis of bilateral glenohumeral joints. Review of the MIP images confirms the above findings. IMPRESSION: 1. No evidence of pulmonary embolus. 2. Bibasilar atelectasis. 3.  Aortic Atherosclerosis (ICD10-I70.0). Electronically Signed   By: Elige KoHetal  Tresea Heine   On: 02/15/2019 10:07   Ct Abdomen Pelvis W Contrast  Result Date: 02/14/2019 CLINICAL DATA:  Vomiting. Confusion. Upper abdominal tenderness. EXAM: CT ABDOMEN AND PELVIS WITH CONTRAST TECHNIQUE: Multidetector CT imaging of the abdomen and pelvis was performed using the standard protocol following bolus administration of intravenous contrast. CONTRAST:  100mL OMNIPAQUE IOHEXOL 300 MG/ML  SOLN COMPARISON:  None. FINDINGS: Lower chest: 6 mm perifissural nodule in the right lower lobe. Bilateral lower lobe atelectasis, left greater than right. There are coronary artery calcifications. Mitral annulus calcifications. Hepatobiliary: No focal hepatic abnormality. Mild periportal edema. Distended gallbladder. Pericholecystic haziness with possible gallbladder wall thickening no calcified gallstone. Common bile duct measures 7 mm. Pancreas: Mild peripancreatic edema about the head versus motion artifact. No pancreatic ductal dilatation. Spleen: Elongated spanning 15 cm cranial caudal. 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 Questionable additional splenic artery aneurysm is inferiorly as well as adjacent to the larger lesion. No suspicious splenic lesion. Adrenals/Urinary Tract: Normal adrenal glands. No hydronephrosis or perinephric edema. Homogeneous renal enhancement with symmetric excretion on delayed phase imaging. Mild lobular renal contours bilaterally. Urinary bladder is partially distended, there is mild bladder wall thickening. Stomach/Bowel:  Nondistended stomach. No small bowel obstruction or inflammation. Appendix not visualized, history of appendectomy. Diverticulosis in sigmoid colon without diverticulitis. Stool within the rectum with mild rectal wall thickening, small volume of colonic stool elsewhere. Vascular/Lymphatic: Aorto bi-iliac atherosclerosis. No aneurysm. No adenopathy. Reproductive: Uterus and bilateral adnexa are unremarkable. Other: No free air, free fluid, or intra-abdominal fluid collection. Musculoskeletal: Severe L1 compression fracture with posterior cortical buckling, chronic based on lumbar spine radiographs from 2019. Remote bilateral superior and inferior pubic rami fractures. Findings suspicious for remote right possibly left sacral insufficiency fractures. Bones are significantly under mineralized. Surgical fixation of bilateral proximal femurs. IMPRESSION: 1. Distended gallbladder with pericholecystic haziness and possible gallbladder wall thickening. Findings suspicious for acute cholecystitis. Recommend further evaluation with right upper quadrant ultrasound. 2. Mild peripancreatic edema about the head of the pancreas versus motion artifact. Recommend correlation with pancreatic enzymes to exclude acute pancreatitis. 3. 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 Recommend annual cross-sectional imaging surveillance, with consideration given to patient's age and demographics. This recommendation follows ACR consensus guidelines: White Paper of the ACR Incidental Findings Committee II on Vascular Findings. J Am Coll Radiol 2013;10:789-794. 4. Mild urinary bladder wall thickening, recommend correlation with urinalysis to exclude urinary tract infection. 5. Sigmoid colonic diverticulosis without diverticulitis. 6. Right lower lobe 6 mm perifissural nodule. Non-contrast chest CT at 6-12 months is recommended. If the nodule is stable at time of repeat CT, then future  CT at 18-24 months (from today's scan) is considered  optional for low-risk patients, but is recommended for high-risk patients. This recommendation follows the consensus statement: Guidelines for Management of Incidental Pulmonary Nodules Detected on CT Images: From the Fleischner Society 2017; Radiology 2017; 284:228-243. 7.  Aortic Atherosclerosis (ICD10-I70.0). Electronically Signed   By: Narda RutherfordMelanie  Sanford M.D.   On: 02/14/2019 22:04   Dg Chest Port 1 View  Result Date: 02/15/2019 CLINICAL DATA:  Hypoxia. EXAM: PORTABLE CHEST 1 VIEW COMPARISON:  Radiograph February 14, 2019. FINDINGS: The heart size and mediastinal contours are within normal limits. No pneumothorax is noted. Increased right basilar atelectasis or infiltrate is noted. Probable minimal bilateral pleural effusions are noted. The visualized skeletal structures are unremarkable. IMPRESSION: Increased right basilar subsegmental atelectasis or infiltrate. Probable minimal pleural effusions. Electronically Signed   By: Lupita RaiderJames  Green Jr M.D.   On: 02/15/2019 07:14   Dg Chest Portable 1 View  Result Date: 02/14/2019 CLINICAL DATA:  Shortness of breath, emesis. EXAM: PORTABLE CHEST 1 VIEW COMPARISON:  12/05/2018 FINDINGS: Cardiomediastinal silhouette is normal. Mediastinal contours appear intact. Calcific atherosclerotic disease of the aorta. There is no evidence of focal airspace consolidation, pleural effusion or pneumothorax. Osseous structures are without acute abnormality. Soft tissues are grossly normal. IMPRESSION: No active disease. Electronically Signed   By: Ted Mcalpineobrinka  Dimitrova M.D.   On: 02/14/2019 20:34   [place] Abdomen Limited Ruq  Result Date: 02/15/2019 CLINICAL DATA:  Abnormal liver function test. EXAM: ULTRASOUND ABDOMEN LIMITED RIGHT UPPER QUADRANT COMPARISON:  CT scan of February 14, 2019. FINDINGS: Gallbladder: Multiple gallstones are noted with the largest measuring 1.7 cm. Sludge is noted. Gallbladder wall is significantly thickened at 8 mm. Mild pericholecystic fluid is noted. No  sonographic Murphy's sign is noted. Common bile duct: Diameter: 4 mm which is within normal limits. Liver: No focal lesion identified. Within normal limits in parenchymal echogenicity. Portal vein is patent on color Doppler imaging with normal direction of blood flow towards the liver. IMPRESSION: Cholelithiasis and sludge is noted within gallbladder lumen with significant gallbladder wall thickening and pericholecystic fluid concerning for acute cholecystitis. Clinical correlation is recommended. Electronically Signed   By: Lupita RaiderJames  Green Jr M.D.   On: 02/15/2019 09:19     Time spent: 35 minutes  Author: Lynden OxfordPranav Kamirah Shugrue, MD [facility] 02/15/2019 5:59 PM  To reach On-call, see care teams to locate the attending and reach out to them via [email or web address]. If 7PM-7AM, please contact night-coverage If you still have difficulty reaching the attending provider, please page the [facility] (Director on Call) for [facility] on amion for assistance.

## 2019-02-15 NOTE — Consult Note (Signed)
Referring Provider: Dr. Pearson GrippeJames Kim Primary Care Physician:  Salley Scarleturham, Kawanta F, MD Primary Gastroenterologist:  Dr. Jena Gaussourk  Date of Admission: 02/14/2019 Date of Consultation: 02/15/2019  Reason for Consultation:  Acute Pancreatitis; Abnormal LFTs  HPI:  Kristin Grant is a 78 y.o. 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 Presented to the ED on [facility]/14/19 from [facility] with nausea, vomiting, and abdominal pain.   ED: Labs remarkable for Lipase  3,121; AST 384; ALT 276; WBC 16.3; LDH 457; Na 139; K 3.5; Bun 25; Creatinine 0.73. Lactic acid 1.1   CT 02/14/19 with distended gallbladder with pericholecystic haziness and possible gallbladder wall thickening, suspicious for acute cholecystitis. Recommend further evaluation with right upper quadrant ultrasound. Mild peripancreatic edema about the head of the pancreas versus motion artifact. Recommend correlation with pancreatic enzymes to exclude acute pancreatitis. Other findings described below under studies/results.   Patient was started on Zosyn to cover for possible cholecystitis. Dr. Lovell SheehanJenkins has also been consulted.   Over night patient has O2 saturations around 88% on RA. CXR with increased right basilar atelectasis or infiltrate. Probable pleural effusion. CTA negative for pleural effusion or PE . LBBB noted on EKG. Hospitalist  spoke with cardiology who said this was present on an old EKG and may be rate related and to check troponin. Troponin negative. IV fluids have since been reduced to 15300ml/hr. Patient stating 100% at this time on 1-2L nasal canula.   Today patient states she is doing ok.   She is oriented to herself and knows she is at the hospital, but does not know where or why she is here. Also not oriented to the day. She denies abdominal pain, nausea, vomiting, reflux symptoms, and difficulty  swallowing. States she had a BM this morning. Denies chest pain and shortness of breath. Denies urinary problems. Of note patient has dementia and is a very limited historian. Past medical history obtained from her chart.    Past Medical History:  Diagnosis Date  . Alzheimer disease (HCC)   . Arthritis   . Coronary artery disease   . Diabetes mellitus   . Difficulty walking   . Hypertension   . Reflux   . Stroke [facility](HCC)     Past Surgical History:  Procedure Laterality Date  . APPENDECTOMY    . FINGER DEBRIDEMENT    . INTRAMEDULLARY (IM) NAIL INTERTROCHANTERIC Right 08/03/2018   Procedure: INTRAMEDULLARY (IM) NAIL INTERTROCHANTRIC;  Surgeon: Eldred MangesYates, Mark C, MD;  Location: [facility] OR;  Service: Orthopedics;  Laterality: Right;  . INTRAMEDULLARY (IM) NAIL INTERTROCHANTERIC Left 12/04/2018   Procedure: INTRAMEDULLARY (IM) NAIL INTERTROCHANTRIC;  Surgeon: Eldred MangesYates, Mark C, MD;  Location: [facility] OR;  Service: Orthopedics;  Laterality: Left;    Prior to Admission medications   Medication Sig Start Date End Date Taking? Authorizing Provider  albuterol (VENTOLIN HFA) 108 (90 Base) MCG/ACT inhaler Inhale 2 puffs into the lungs every 6 (six) hours as needed for wheezing or shortness of breath. 02/05/19  Yes [place], Velna HatchetKawanta F, MD  aspirin EC 81 MG tablet Take 1 tablet (81 mg total) by mouth daily. Patient taking differently: Take 325 mg by mouth daily.  02/05/19  Yes [place], Velna HatchetKawanta F, MD  clopidogrel (PLAVIX) 75 MG tablet TAKE 1 TABLET (75mg ) BY MOUTH ONCE DAILY. Patient taking differently: Take 75 mg by mouth daily.  02/05/19  Yes [place], Velna HatchetKawanta F, MD  Ferrous Sulfate (IRON) 325 (65 Fe) MG TABS Take 1 tablet (325 mg total) by mouth daily. 02/05/19  Yes [place], Velna Hatchet, MD  insulin glargine (LANTUS) 100 UNIT/ML injection Inject 14 Units into the skin at bedtime.   Yes [provider]  insulin lispro (HUMALOG KWIKPEN) 100 UNIT/ML KwikPen Inject 0.06 mLs (6 Units total) into the skin 2 (two) times daily  with a meal. Lunch and dinner. 02/05/19  Yes [place], Velna Hatchet, MD  levothyroxine (SYNTHROID) 50 MCG tablet Take 1 tablet (50 mcg total) by mouth daily. 02/05/19  Yes [place], Velna Hatchet, MD  linagliptin (TRADJENTA) 5 MG TABS tablet Take 1 tablet (5 mg total) by mouth daily. 02/05/19  Yes [place], Velna Hatchet, MD  lisinopril (ZESTRIL) 40 MG tablet Take 1 tablet (40 mg total) by mouth daily. 02/05/19  Yes [place], Velna Hatchet, MD  mirtazapine (REMERON) 7.5 MG tablet Take 1 tablet (7.5 mg total) by mouth at bedtime. 02/05/19  Yes [place], Velna Hatchet, MD  Multiple Vitamin (DAILY VITE) TABS Take 1 tablet by mouth daily. 02/05/19  Yes [place], Velna Hatchet, MD  propranolol (INDERAL) 40 MG tablet Take 1 tablet (40 mg total) by mouth 2 (two) times daily. 02/05/19  Yes [place], Velna Hatchet, MD  NOVOFINE AUTOCOVER 30G X 8 MM MISC USE AS DIRECTED FOR INSULIN ADMINISTRATION 3 TIMES DAILY. Patient not taking: Reported on 02/14/2019 11/23/18   Salley Scarlet, MD  traMADol (ULTRAM) 50 MG tablet Take 1 tablet (50 mg total) by mouth every 6 (six) hours as needed for moderate pain. Patient not taking: Reported on 02/14/2019 02/05/19   Salley Scarlet, MD    Current Facility-Administered Medications  Medication Dose Route Frequency Provider Last Rate Last Dose  . 0.9 %  sodium chloride infusion   Intravenous Continuous Pearson Grippe, MD 125 mL/hr at 02/15/19 [id number]    . HYDROmorphone (DILAUDID) injection 0.5 mg  0.5 mg Intravenous Once Fawze, Mina A, PA-C      . insulin aspart (novoLOG) injection 0-9 Units  0-9 Units Subcutaneous Q4H Pearson Grippe, MD   1 Units at 02/15/19 [phone]  . metoprolol tartrate (LOPRESSOR) injection 2.5 mg  2.5 mg Intravenous Q6H Pearson Grippe, MD   2.5 mg at 02/15/19 0606  . morphine 2 MG/ML injection 0.5 mg  0.5 mg Intravenous Q6H PRN Pearson Grippe, MD      . ondansetron [facility]) injection 4 mg  4 mg Intravenous Q6H PRN Pearson Grippe, MD      . piperacillin-tazobactam (ZOSYN) IVPB 3.375 g  3.375 g Intravenous Elza Rafter, MD  12.5 mL/hr at 02/15/19 0746 3.375 g at 02/15/19 0746    Allergies as of 02/14/2019  . (No Known Allergies)    Family History  Problem Relation Age of Onset  . Hypertension Mother   . Heart disease Mother   . Hypertension Father   . Heart disease Father   . Hypertension Sister   . Heart disease Sister   . Cancer Sister        BREAST   . Hypertension Brother   . Heart disease Brother     Social History   Socioeconomic History  . Marital status: Widowed    Spouse name: Not on file  . Number of children: Not on file  . Years of education: Not on file  . Highest education level: Not on file  Occupational History  . Not on file  Social Needs  . Financial resource strain: Not on file  . Food insecurity  Worry: Not on file    Inability: Not on file  . Transportation needs    Medical: Not on file    Non-medical: Not on file  Tobacco Use  . Smoking status: Never Smoker  . Smokeless tobacco: Never Used  Substance and Sexual Activity  . Alcohol use: No  . Drug use: No  . Sexual activity: Not on file  Lifestyle  . Physical activity    Days per week: Not on file    Minutes per session: Not on file  . Stress: Not on file  Relationships  . Social Musicianconnections    Talks on phone: Not on file    Gets together: Not on file    Attends religious service: Not on file    Active member of club or organization: Not on file    Attends meetings of clubs or organizations: Not on file    Relationship status: Not on file  . Intimate partner violence    Fear of current or ex partner: Not on file    Emotionally abused: Not on file    Physically abused: Not on file    Forced sexual activity: Not on file  Other Topics Concern  . Not on file  Social History Narrative  . Not on file    Review of Systems: Gen: Denies fever, chills, loss of appetite CV: Denies chest pain, heart palpitations Resp: Denies shortness of breath with rest, cough, wheezing GI: See HPI GU : Denies  dysuria  MS: Denies joint pain,swelling, cramping Derm: Denies rash, itching, dry skin Psych: Denies depression, anxiety. She does have history of dementia.  Heme: Denies bruising, bleeding  Physical Exam: Vital signs in last 24 hours: Temp:  [97.7 F (36.5 C)-98.3 F (36.8 C)] 97.7 F (36.5 C) (06/22 0600) Pulse Rate:  [62-103] 74 (06/22 0600) Resp:  [16-18] 18 (06/22 0600) BP: (102-149)/(42-69) 122/69 (06/22 0600) SpO2:  [88 %-100 %] 100 % (06/22 0600) Weight:  [58.1 kg] 58.1 kg (06/22 0600) Last BM Date: 02/14/19 General:   Alert, pleasant and cooperative.In no acute distress. With nasal canula.   Head:  Normocephalic and atraumatic. Eyes:  Sclera clear, no icterus.   Conjunctiva pink. Ears:  Normal auditory acuity. Nose:  No deformity, discharge,  or lesions. Lungs:  Clear throughout to auscultation.   No wheezes, crackles, or rhonchi. No acute distress. Heart:  Regular rate and rhythm; no murmurs, clicks, rubs,  or gallops. Abdomen:  Soft, and nondistended. Minimal tenderness to palpation across upper abdomen. No masses, hepatosplenomegaly or hernias noted. Normal bowel sounds, without guarding, and without rebound.   Rectal:  Deferred until time of colonoscopy.   Pulses:  Normal pulses noted. Extremities:  Without edema. Neurologic:  Alert and  oriented to self and knows she is at a hospital, but doesn't know what hospital or date. Skin:  Intact without significant lesions or rashes. Psych:  Alert and cooperative. Normal mood and affect.   Intake/Output from previous day: 06/21 0701 - 06/22 0700 In: 508.3 [I.V.:458.3; IV Piggyback:50] Out: -   Lab Results: Recent Labs    02/14/19 2004 02/15/19 0416  WBC 16.3* 8.3  HGB 12.7 11.8*  HCT 41.8 37.8  PLT 139* 110*   BMET Recent Labs    02/14/19 2004 02/15/19 0416  NA 139 143  K 3.5 3.2*  CL 105 105  CO2 25 27  GLUCOSE 227* 187*  BUN 25* 25*  CREATININE 0.73 0.69  CALCIUM 8.4* 8.3*   LFT Recent Labs  02/14/19 2004 02/15/19 0416  PROT 5.8* 5.5*  ALBUMIN 3.4* 3.2*  AST 384* 406*  ALT 276* 432*  ALKPHOS 114 133*  BILITOT 1.2 1.0   PT/INR Recent Labs    02/14/19 2004  LABPROT 14.1  INR 1.1    Studies/Results: Ct Abdomen Pelvis W Contrast  Result Date: 02/14/2019 CLINICAL DATA:  Vomiting. Confusion. Upper abdominal tenderness. EXAM: CT ABDOMEN AND PELVIS WITH CONTRAST TECHNIQUE: Multidetector CT imaging of the abdomen and pelvis was performed using the standard protocol following bolus administration of intravenous contrast. CONTRAST:  100mL OMNIPAQUE IOHEXOL 300 MG/ML  SOLN COMPARISON:  None. FINDINGS: Lower chest: 6 mm perifissural nodule in the right lower lobe. Bilateral lower lobe atelectasis, left greater than right. There are coronary artery calcifications. Mitral annulus calcifications. Hepatobiliary: No focal hepatic abnormality. Mild periportal edema. Distended gallbladder. Pericholecystic haziness with possible gallbladder wall thickening no calcified gallstone. Common bile duct measures 7 mm. Pancreas: Mild peripancreatic edema about the head versus motion artifact. No pancreatic ductal dilatation. Spleen: Elongated spanning 15 cm cranial caudal. 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 Questionable additional splenic artery aneurysm is inferiorly as well as adjacent to the larger lesion. No suspicious splenic lesion. Adrenals/Urinary Tract: Normal adrenal glands. No hydronephrosis or perinephric edema. Homogeneous renal enhancement with symmetric excretion on delayed phase imaging. Mild lobular renal contours bilaterally. Urinary bladder is partially distended, there is mild bladder wall thickening. Stomach/Bowel: Nondistended stomach. No small bowel obstruction or inflammation. Appendix not visualized, history of appendectomy. Diverticulosis in sigmoid colon without diverticulitis. Stool within the rectum with mild rectal wall thickening, small  volume of colonic stool elsewhere. Vascular/Lymphatic: Aorto bi-iliac atherosclerosis. No aneurysm. No adenopathy. Reproductive: Uterus and bilateral adnexa are unremarkable. Other: No free air, free fluid, or intra-abdominal fluid collection. Musculoskeletal: Severe L1 compression fracture with posterior cortical buckling, chronic based on lumbar spine radiographs from 2019. Remote bilateral superior and inferior pubic rami fractures. Findings suspicious for remote right possibly left sacral insufficiency fractures. Bones are significantly under mineralized. Surgical fixation of bilateral proximal femurs. IMPRESSION: 1. Distended gallbladder with pericholecystic haziness and possible gallbladder wall thickening. Findings suspicious for acute cholecystitis. Recommend further evaluation with right upper quadrant ultrasound. 2. Mild peripancreatic edema about the head of the pancreas versus motion artifact. Recommend correlation with pancreatic enzymes to exclude acute pancreatitis. 3. 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 Recommend annual cross-sectional imaging surveillance, with consideration given to patient's age and demographics. This recommendation follows ACR consensus guidelines: White Paper of the ACR Incidental Findings Committee II on Vascular Findings. J Am Coll Radiol 2013;10:789-794. 4. Mild urinary bladder wall thickening, recommend correlation with urinalysis to exclude urinary tract infection. 5. Sigmoid colonic diverticulosis without diverticulitis. 6. Right lower lobe 6 mm perifissural nodule. Non-contrast chest CT at 6-12 months is recommended. If the nodule is stable at time of repeat CT, then future CT at 18-24 months (from today's scan) is considered optional for low-risk patients, but is recommended for high-risk patients. This recommendation follows the consensus statement: Guidelines for Management of Incidental Pulmonary Nodules Detected on CT Images: From the Fleischner  Society 2017; Radiology 2017; 284:228-243. 7.  Aortic Atherosclerosis (ICD10-I70.0). Electronically Signed   By: Narda RutherfordMelanie  Sanford M.D.   On: 02/14/2019 22:04   Dg Chest Port 1 View  Result Date: 02/15/2019 CLINICAL DATA:  Hypoxia. EXAM: PORTABLE CHEST 1 VIEW COMPARISON:  Radiograph February 14, 2019. FINDINGS: The heart size and mediastinal contours are within normal limits. No pneumothorax is noted. Increased  right basilar atelectasis or infiltrate is noted. Probable minimal bilateral pleural effusions are noted. The visualized skeletal structures are unremarkable. IMPRESSION: Increased right basilar subsegmental atelectasis or infiltrate. Probable minimal pleural effusions. Electronically Signed   By: Marijo Conception M.D.   On: 02/15/2019 07:14   Dg Chest Portable 1 View  Result Date: 02/14/2019 CLINICAL DATA:  Shortness of breath, emesis. EXAM: PORTABLE CHEST 1 VIEW COMPARISON:  12/05/2018 FINDINGS: Cardiomediastinal silhouette is normal. Mediastinal contours appear intact. Calcific atherosclerotic disease of the aorta. There is no evidence of focal airspace consolidation, pleural effusion or pneumothorax. Osseous structures are without acute abnormality. Soft tissues are grossly normal. IMPRESSION: No active disease. Electronically Signed   By: Fidela Salisbury M.D.   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 Patient was admitted with acute pancreatitis and elevated LFTs. On admission, lipase 3,121, AST 384, ALT 276. CT on 02/14/19 with mild peripancreatic edema about the head of the pancreas versus motion artifact and findings suspicious for acute cholecystitis. [place] on 02/15/19 with cholelithiasis and sludge within gallbladder lumen with significant gallbladder wall thickening and pericholecystic fluid concerning for acute  cholecystitis. Dr. Arnoldo Morale has been consulted.    Acute Pancreatitis: Etiology is likely biliary. Labs today with Lipase improving, now at 1,537. LFTs have elevated today now with AST 406 and ALT 432. Bilirubin has remained normal. BUN has not improved and remains at 25.  WBC normal, Hgb slightly decreased at 11.8, likely dilutional.  Patients fluids decreased to 100 ml/hr this morning due to desaturation on RA. Work-up negative as described in HPI. Patient doesn't have a known history of pulmonary disease, CHF, or renal disease. Fluid resuscitation recommendation in the setting of pancreatitis is well documented in the literature. This patient does not have any contraindications to aggressive fluid resuscitation. Spoke with Dr. Gala Romney who also agrees with increasing fluid resuscitation measures. Will increase NS to 250 ml/hr for 8 hours followed by NS 175 ml/hr. This allows for aggressive fluid resuscitation over the first 24 hours where it provides the most benefit. I am utilizing 62ml/kg recommendation calculation and airing on the side of caution considering desaturation last night. Patient should be closely monitored for fluid overload.   Elevated LFTs: Likely biliary related. [place] with documented gallstones and sludge, and suspicious for cholecystitis. Can't exclude microlithiasis. No evidence of CBD dilation at this time. Dr. Arnoldo Morale has been consulted. Will continue to trend LFTs. Will follow up on Hepatitis panel results.  Acute Cholecystitis: WBC decreased to 8.3 today from 16.3 yesterday. On Zosyn. Patient will need cholecystectomy. Dr. Arnoldo Morale has been consulted.   Plan: Increase NS to 224mL/hr for the next 8 hours followed by 158mL/hr.  Close monitoring for fluid overload. Remain NPO at this time.  Will need cholecystectomy. Dr. Arnoldo Morale has been consulted.  Continue to follow LFTs.  Follow-up on Hepatitis Panel Results Continue Zosyn  Continue Zofran as needed for nausea    LOS: 1 day     02/15/2019, 8:06 AM   Aliene Altes, PA-C [facility]

## 2019-02-15 NOTE — Consult Note (Signed)
Reason for Consult: Pancreatitis, cholecystitis Referring Physician: Dr. Elsie AmisPatel  Kristin Grant is an 78 y.o. female.  HPI: Patient is an 78 year old white female who was transferred to the emergency room from the nursing care facility for evaluation treatment of nausea and vomiting.  She was found to have elevated liver enzyme tests as well as an elevated lipase.  CT scan of the abdomen was performed which revealed a distended gallbladder with a thickened wall.  No stones were seen.  Patient is a poor historian.  She was admitted to the hospital for further evaluation treatment.  History obtained from admitting history and physical examination.  Past Medical History:  Diagnosis Date  . Alzheimer disease (HCC)   . Arthritis   . Coronary artery disease   . Diabetes mellitus   . Difficulty walking   . Hypertension   . Reflux   . Stroke [facility](HCC)     Past Surgical History:  Procedure Laterality Date  . APPENDECTOMY    . FINGER DEBRIDEMENT    . INTRAMEDULLARY (IM) NAIL INTERTROCHANTERIC Right 08/03/2018   Procedure: INTRAMEDULLARY (IM) NAIL INTERTROCHANTRIC;  Surgeon: Kristin Grant, Kristin Zuccaro C, MD;  Location: [facility] OR;  Service: Orthopedics;  Laterality: Right;  . INTRAMEDULLARY (IM) NAIL INTERTROCHANTERIC Left 12/04/2018   Procedure: INTRAMEDULLARY (IM) NAIL INTERTROCHANTRIC;  Surgeon: Kristin Grant, Kristin Seiple C, MD;  Location: [facility] OR;  Service: Orthopedics;  Laterality: Left;    Family History  Problem Relation Age of Onset  . Hypertension Mother   . Heart disease Mother   . Hypertension Father   . Heart disease Father   . Hypertension Sister   . Heart disease Sister   . Cancer Sister        BREAST   . Hypertension Brother   . Heart disease Brother     Social History:  reports that she has never smoked. She has never used smokeless tobacco. She reports that she does not drink alcohol or use drugs.  Allergies: No Known Allergies  Medications: I have reviewed the patient's current medications.  Results for  orders placed or performed during the hospital encounter of 02/14/19 (from the past 48 hour(s))  CBG monitoring, ED     Status: Abnormal   Collection Time: 02/14/19  7:53 PM  Result Value Ref Range   Glucose-Capillary 186 (H) 70 - 99 mg/dL  Comprehensive metabolic panel     Status: Abnormal   Collection Time: 02/14/19  8:04 PM  Result Value Ref Range   Sodium 139 135 - 145 mmol/L   Potassium 3.5 3.5 - 5.1 mmol/L   Chloride 105 98 - 111 mmol/L   CO2 25 22 - 32 mmol/L   Glucose, Bld 227 (H) 70 - 99 mg/dL   BUN 25 (H) 8 - 23 mg/dL   Creatinine, Ser 0.450.73 0.44 - 1.00 mg/dL   Calcium 8.4 (L) 8.9 - 10.3 mg/dL   Total Protein 5.8 (L) 6.5 - 8.1 g/dL   Albumin 3.4 (L) 3.5 - 5.0 g/dL   AST 409384 (H) 15 - 41 U/L   ALT 276 (H) 0 - 44 U/L   Alkaline Phosphatase 114 38 - 126 U/L   Total Bilirubin 1.2 0.3 - 1.2 mg/dL   GFR calc non Af Amer >60 >60 mL/min   GFR calc Af Amer >60 >60 mL/min   Anion gap 9 5 - 15    Comment: Performed at [facility], [address] [place], [place] [address]  CBC     Status: Abnormal   Collection Time:  02/14/19  8:04 PM  Result Value Ref Range   WBC 16.3 (H) 4.0 - 10.5 K/uL   RBC 4.73 3.87 - 5.11 MIL/uL   Hemoglobin 12.7 12.0 - 15.0 g/dL   HCT 65.741.8 84.636.0 - 96.246.0 %   MCV 88.4 80.0 - 100.0 fL   MCH 26.8 26.0 - 34.0 pg   MCHC 30.4 30.0 - 36.0 g/dL   RDW 95.215.1 84.111.5 - 32.415.5 %   Platelets 139 (L) 150 - 400 K/uL   nRBC 0.0 0.0 - 0.2 %    Comment: Performed at [facility], [address] [place], [place] [address]  Protime-INR - (order if Patient is taking Coumadin / Warfarin)     Status: None   Collection Time: 02/14/19  8:04 PM  Result Value Ref Range   Prothrombin Time 14.1 11.4 - 15.2 seconds   INR 1.1 0.8 - 1.2    Comment: (NOTE) INR goal varies based on device and disease states. Performed at [facility], [address] [place], [place] [address]   Troponin I - ONCE - STAT     Status: None   Collection Time: 02/14/19  8:04 PM  Result Value Ref Range    Troponin I <0.03 <0.03 ng/mL    Comment: Performed at [facility], [address] [place], [place] [address]  Lipase, blood     Status: Abnormal   Collection Time: 02/14/19  8:04 PM  Result Value Ref Range   Lipase 3,121 (H) 11 - 51 U/L    Comment: RESULTS CONFIRMED BY MANUAL DILUTION Performed at [facility], [address] [place], [place] [address]   Lactic acid, plasma     Status: None   Collection Time: 02/14/19  8:04 PM  Result Value Ref Range   Lactic Acid, Venous 1.1 0.5 - 1.9 mmol/L    Comment: Performed at [facility], [address] [place], [place] [address]  Lactate dehydrogenase     Status: Abnormal   Collection Time: 02/14/19  8:04 PM  Result Value Ref Range   LDH 457 (H) 98 - 192 U/L    Comment: Performed at [facility], [address] [place], [place] [address]  Lactic acid, plasma     Status: None   Collection Time: 02/14/19  9:54 PM  Result Value Ref Range   Lactic Acid, Venous 1.8 0.5 - 1.9 mmol/L    Comment: Performed at [facility], [address] [place], [place] [address]  SARS Coronavirus 2 (CEPHEID - Performed in [facility] hospital lab), Hosp Order     Status: None   Collection Time: 02/14/19 10:20 PM   Specimen: Nasopharyngeal Swab  Result Value Ref Range   SARS Coronavirus 2 NEGATIVE NEGATIVE    Comment: (NOTE) If result is NEGATIVE SARS-CoV-2 target nucleic acids are NOT DETECTED. The SARS-CoV-2 RNA is generally detectable in upper and lower  respiratory specimens during the acute phase of infection. The lowest  concentration of SARS-CoV-2 viral copies this assay can detect is 250  copies / mL. A negative result does not preclude SARS-CoV-2 infection  and should not be used as the sole basis for treatment or other  patient management decisions.  A negative result may occur with  improper specimen collection / handling, submission of specimen other  than nasopharyngeal swab, presence of viral mutation(s) within the  areas targeted by this  assay, and inadequate number of viral copies  (<250 copies / mL). A negative result must be combined with clinical  observations, patient  history, and epidemiological information. If result is POSITIVE SARS-CoV-2 target nucleic acids are DETECTED. The SARS-CoV-2 RNA is generally detectable in upper and lower  respiratory specimens dur ing the acute phase of infection.  Positive  results are indicative of active infection with SARS-CoV-2.  Clinical  correlation with patient history and other diagnostic information is  necessary to determine patient infection status.  Positive results do  not rule out bacterial infection or co-infection with other viruses. If result is PRESUMPTIVE POSTIVE SARS-CoV-2 nucleic acids MAY BE PRESENT.   A presumptive positive result was obtained on the submitted specimen  and confirmed on repeat testing.  While 2019 novel coronavirus  (SARS-CoV-2) nucleic acids may be present in the submitted sample  additional confirmatory testing may be necessary for epidemiological  and / or clinical management purposes  to differentiate between  SARS-CoV-2 and other Sarbecovirus currently known to infect humans.  If clinically indicated additional testing with an alternate test  methodology [phone]) is advised. The SARS-CoV-2 RNA is generally  detectable in upper and lower respiratory sp ecimens during the acute  phase of infection. The expected result is Negative. Fact Sheet for Patients:  BoilerBrush.com.cy Fact Sheet for Healthcare Providers: https://pope.com/ This test is not yet approved or cleared by the [place] FDA and has been authorized for detection and/or diagnosis of SARS-CoV-2 by FDA under an Emergency Use Authorization (EUA).  This EUA will remain in effect (meaning this test can be used) for the duration of the COVID-19 declaration under Section 564(b)(1) of the Act, 21 U.S.Grant. section 360bbb-3(b)(1),  unless the authorization is terminated or revoked sooner. Performed at [facility], [address] [place], [place] [address]   Blood gas, arterial     Status: Abnormal   Collection Time: 02/15/19  1:55 AM  Result Value Ref Range   FIO2 28.00    pH, Arterial 7.445 7.350 - 7.450   pCO2 arterial 43.6 32.0 - 48.0 mmHg   pO2, Arterial 115 (H) 83.0 - 108.0 mmHg   Bicarbonate 29.2 (H) 20.0 - 28.0 mmol/L   Acid-Base Excess 5.5 (H) 0.0 - 2.0 mmol/L   O2 Saturation 98.2 %   Patient temperature 37.0    Allens test (pass/fail) PASS PASS    Comment: Performed at [facility], [address] [place], [place] [address]  Glucose, capillary     Status: Abnormal   Collection Time: 02/15/19  2:13 AM  Result Value Ref Range   Glucose-Capillary 217 (H) 70 - 99 mg/dL  Comprehensive metabolic panel     Status: Abnormal   Collection Time: 02/15/19  4:16 AM  Result Value Ref Range   Sodium 143 135 - 145 mmol/L   Potassium 3.2 (L) 3.5 - 5.1 mmol/L   Chloride 105 98 - 111 mmol/L   CO2 27 22 - 32 mmol/L   Glucose, Bld 187 (H) 70 - 99 mg/dL   BUN 25 (H) 8 - 23 mg/dL   Creatinine, Ser 4.40 0.44 - 1.00 mg/dL   Calcium 8.3 (L) 8.9 - 10.3 mg/dL   Total Protein 5.5 (L) 6.5 - 8.1 g/dL   Albumin 3.2 (L) 3.5 - 5.0 g/dL   AST 347 (H) 15 - 41 U/L   ALT 432 (H) 0 - 44 U/L   Alkaline Phosphatase 133 (H) 38 - 126 U/L   Total Bilirubin 1.0 0.3 - 1.2 mg/dL   GFR calc non Af Amer >60 >60 mL/min   GFR calc Af Amer >60 >60 mL/min   Anion gap  11 5 - 15    Comment: Performed at [facility], [address], [place], [place] [address]  CBC     Status: Abnormal   Collection Time: 02/15/19  4:16 AM  Result Value Ref Range   WBC 8.3 4.0 - 10.5 K/uL   RBC 4.30 3.87 - 5.11 MIL/uL   Hemoglobin 11.8 (L) 12.0 - 15.0 g/dL   HCT 84.6 96.2 - 95.2 %   MCV 87.9 80.0 - 100.0 fL   MCH 27.4 26.0 - 34.0 pg   MCHC 31.2 30.0 - 36.0 g/dL   RDW 84.1 32.4 - 40.1 %   Platelets 110 (L) 150 - 400 K/uL    Comment: REPEATED TO  VERIFY SPECIMEN CHECKED FOR CLOTS Immature Platelet Fraction may be clinically indicated, consider ordering this additional test [id number]    nRBC 0.0 0.0 - 0.2 %    Comment: Performed at [facility], [address], [place], [place] [address]  Lipase, blood     Status: Abnormal   Collection Time: 02/15/19  4:16 AM  Result Value Ref Range   Lipase 1,537 (H) 11 - 51 U/L    Comment: RESULTS CONFIRMED BY MANUAL DILUTION Performed at [facility], [address], [place], [place] [address]   Lipid panel     Status: None   Collection Time: 02/15/19  4:16 AM  Result Value Ref Range   Cholesterol 96 0 - 200 mg/dL   Triglycerides 36 <563 mg/dL   HDL 42 >87 mg/dL   Total CHOL/HDL Ratio 2.3 RATIO   VLDL 7 0 - 40 mg/dL   LDL Cholesterol 47 0 - 99 mg/dL    Comment:        Total Cholesterol/HDL:CHD Risk Coronary Heart Disease Risk Table                     Men   Women  1/2 Average Risk   3.4   3.3  Average Risk       5.0   4.4  2 X Average Risk   9.6   7.1  3 X Average Risk  23.4   11.0        Use the calculated Patient Ratio above and the CHD Risk Table to determine the patient's CHD Risk.        ATP III CLASSIFICATION (LDL):  <100     mg/dL   Optimal  564-332  mg/dL   Near or Above                    Optimal  130-159  mg/dL   Borderline  951-884  mg/dL   High  >166     mg/dL   Very High Performed at [facility], [address], [place], [place] [address]   Troponin I - Now Then Q6H     Status: None   Collection Time: 02/15/19  4:16 AM  Result Value Ref Range   Troponin I <0.03 <0.03 ng/mL    Comment: Performed at [facility], [address] [place], [place] [address]  D-dimer, quantitative (not at [facility])     Status: Abnormal   Collection Time: 02/15/19  4:16 AM  Result Value Ref Range   D-Dimer, Quant 1.26 (H) 0.00 - 0.50 ug/mL-FEU    Comment: (NOTE) At the manufacturer cut-off of 0.50 ug/mL FEU, this assay has been documented to exclude PE with a sensitivity and negative  predictive value of 97 to 99%.  At this time, this  assay has not been approved by the FDA to exclude DVT/VTE. Results should be correlated with clinical presentation. Performed at [facility], [address] [place], [place] [address]   Glucose, capillary     Status: Abnormal   Collection Time: 02/15/19  7:21 AM  Result Value Ref Range   Glucose-Capillary 129 (H) 70 - 99 mg/dL    Ct Abdomen Pelvis W Contrast  Result Date: 02/14/2019 CLINICAL DATA:  Vomiting. Confusion. Upper abdominal tenderness. EXAM: CT ABDOMEN AND PELVIS WITH CONTRAST TECHNIQUE: Multidetector CT imaging of the abdomen and pelvis was performed using the standard protocol following bolus administration of intravenous contrast. CONTRAST:  100mL OMNIPAQUE IOHEXOL 300 MG/ML  SOLN COMPARISON:  None. FINDINGS: Lower chest: 6 mm perifissural nodule in the right lower lobe. Bilateral lower lobe atelectasis, left greater than right. There are coronary artery calcifications. Mitral annulus calcifications. Hepatobiliary: No focal hepatic abnormality. Mild periportal edema. Distended gallbladder. Pericholecystic haziness with possible gallbladder wall thickening no calcified gallstone. Common bile duct measures 7 mm. Pancreas: Mild peripancreatic edema about the head versus motion artifact. No pancreatic ductal dilatation. Spleen: Elongated spanning 15 cm cranial caudal. 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 Questionable additional splenic artery aneurysm is inferiorly as well as adjacent to the larger lesion. No suspicious splenic lesion. Adrenals/Urinary Tract: Normal adrenal glands. No hydronephrosis or perinephric edema. Homogeneous renal enhancement with symmetric excretion on delayed phase imaging. Mild lobular renal contours bilaterally. Urinary bladder is partially distended, there is mild bladder wall thickening. Stomach/Bowel: Nondistended stomach. No small bowel obstruction or inflammation.  Appendix not visualized, history of appendectomy. Diverticulosis in sigmoid colon without diverticulitis. Stool within the rectum with mild rectal wall thickening, small volume of colonic stool elsewhere. Vascular/Lymphatic: Aorto bi-iliac atherosclerosis. No aneurysm. No adenopathy. Reproductive: Uterus and bilateral adnexa are unremarkable. Other: No free air, free fluid, or intra-abdominal fluid collection. Musculoskeletal: Severe L1 compression fracture with posterior cortical buckling, chronic based on lumbar spine radiographs from 2019. Remote bilateral superior and inferior pubic rami fractures. Findings suspicious for remote right possibly left sacral insufficiency fractures. Bones are significantly under mineralized. Surgical fixation of bilateral proximal femurs. IMPRESSION: 1. Distended gallbladder with pericholecystic haziness and possible gallbladder wall thickening. Findings suspicious for acute cholecystitis. Recommend further evaluation with right upper quadrant ultrasound. 2. Mild peripancreatic edema about the head of the pancreas versus motion artifact. Recommend correlation with pancreatic enzymes to exclude acute pancreatitis. 3. 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 Recommend annual cross-sectional imaging surveillance, with consideration given to patient's age and demographics. This recommendation follows ACR consensus guidelines: White Paper of the ACR Incidental Findings Committee II on Vascular Findings. J Am Coll Radiol 2013;10:789-794. 4. Mild urinary bladder wall thickening, recommend correlation with urinalysis to exclude urinary tract infection. 5. Sigmoid colonic diverticulosis without diverticulitis. 6. Right lower lobe 6 mm perifissural nodule. Non-contrast chest CT at 6-12 months is recommended. If the nodule is stable at time of repeat CT, then future CT at 18-24 months (from today's scan) is considered optional for low-risk patients, but is recommended for high-risk  patients. This recommendation follows the consensus statement: Guidelines for Management of Incidental Pulmonary Nodules Detected on CT Images: From the Fleischner Society 2017; Radiology 2017; 284:228-243. 7.  Aortic Atherosclerosis (ICD10-I70.0). Electronically Signed   By: Narda RutherfordMelanie  Sanford M.D.   On: 02/14/2019 22:04   Dg Chest Port 1 View  Result Date: 02/15/2019 CLINICAL DATA:  Hypoxia. EXAM: PORTABLE CHEST 1 VIEW COMPARISON:  Radiograph February 14, 2019.  FINDINGS: The heart size and mediastinal contours are within normal limits. No pneumothorax is noted. Increased right basilar atelectasis or infiltrate is noted. Probable minimal bilateral pleural effusions are noted. The visualized skeletal structures are unremarkable. IMPRESSION: Increased right basilar subsegmental atelectasis or infiltrate. Probable minimal pleural effusions. Electronically Signed   By: Marijo Conception M.D.   On: 02/15/2019 07:14   Dg Chest Portable 1 View  Result Date: 02/14/2019 CLINICAL DATA:  Shortness of breath, emesis. EXAM: PORTABLE CHEST 1 VIEW COMPARISON:  12/05/2018 FINDINGS: Cardiomediastinal silhouette is normal. Mediastinal contours appear intact. Calcific atherosclerotic disease of the aorta. There is no evidence of focal airspace consolidation, pleural effusion or pneumothorax. Osseous structures are without acute abnormality. Soft tissues are grossly normal. IMPRESSION: No active disease. Electronically Signed   By: Fidela Salisbury M.D.   On: 02/14/2019 20:34    ROS:  Review of systems not obtained due to patient factors.  Blood pressure 122/69, pulse 74, temperature 97.7 F (36.5 Grant), temperature source Oral, resp. rate 18, height 5\' 6"  (1.676 m), weight 58.1 kg, SpO2 100 %. Physical Exam: Somnolent white female in no acute distress she was somewhat arousable. Head is normocephalic, atraumatic Lungs clear to auscultation with good breath sounds bilaterally Heart examination reveals a regular rate and  rhythm Abdomen is soft and flat.  No pain was elicited to palpation.  No rigidity is noted.  Labs and CT scan results reviewed  Assessment/Plan: Impression: Acute pancreatitis, associated distended gallbladder. Plan: Right upper quadrant ultrasound is pending.  Further management is pending those results.  Aviva Signs 02/15/2019, 8:43 AM

## 2019-02-15 NOTE — Progress Notes (Signed)
Xcover Per RN, pox 88% on RA Pt asymptomatic other than being somnolent.  Pt didn't receive dilaudid in ED per RN  Exam:  T afebrile P 103=> now 80's,  Bp 112/49  Heent: anicteric Neck: no jvd Heart: rrr s1, s2 Lung: slight decrease in bs at right lung base, no crackles, no wheeze Abd: soft, nt, nd, +bs Ext: no c/ce  A/P Hypoxia ABG CXR D dimer, if positive then consider CTA chest  Tachycardia Tele Trop I q6h x3 Check cpk, mb 12 lead EKG  ?AMS vs baseline dementia Check CT brain   Addendum EKG reviewed nsr at 85, LAD, new LBBB (compared to EKG 6/21) D/w cardiology (Dr. Saunders Revel), old EKG show LBBB, this may be rate related LBBB, pt asymptomatic, will await troponin. If + heparinize and consider transfer to [facility].

## 2019-02-15 NOTE — Progress Notes (Signed)
ANTIBIOTIC CONSULT NOTE-Preliminary  Pharmacy Consult for zosyn Indication: cholecystitis  No Known Allergies  Patient Measurements: Height: 5\' 4"  (162.6 cm) IBW/kg (Calculated) : 54.7 Adjusted Body Weight:   Vital Signs: Temp: 98.3 F (36.8 C) (06/22 0105) Temp Source: Oral (06/21 1937) BP: 112/49 (06/22 0105) Pulse Rate: 103 (06/22 0105)  Labs: Recent Labs    02/14/19 2004  WBC 16.3*  HGB 12.7  PLT 139*  CREATININE 0.73    CrCl cannot be calculated (Unknown ideal weight.).  No results for input(s): VANCOTROUGH, VANCOPEAK, VANCORANDOM, GENTTROUGH, GENTPEAK, GENTRANDOM, TOBRATROUGH, TOBRAPEAK, TOBRARND, AMIKACINPEAK, AMIKACINTROU, AMIKACIN in the last 72 hours.   Microbiology: Recent Results (from the past 720 hour(s))  SARS Coronavirus 2 (CEPHEID - Performed in [facility] lab), Hosp Order     Status: None   Collection Time: 02/14/19 10:20 PM   Specimen: Nasopharyngeal Swab  Result Value Ref Range Status   SARS Coronavirus 2 NEGATIVE NEGATIVE Final    Comment: (NOTE) If result is NEGATIVE SARS-CoV-2 target nucleic acids are NOT DETECTED. The SARS-CoV-2 RNA is generally detectable in upper and lower  respiratory specimens during the acute phase of infection. The lowest  concentration of SARS-CoV-2 viral copies this assay can detect is 250  copies / mL. A negative result does not preclude SARS-CoV-2 infection  and should not be used as the sole basis for treatment or other  patient management decisions.  A negative result may occur with  improper specimen collection / handling, submission of specimen other  than nasopharyngeal swab, presence of viral mutation(s) within the  areas targeted by this assay, and inadequate number of viral copies  (<250 copies / mL). A negative result must be combined with clinical  observations, patient history, and epidemiological information. If result is POSITIVE SARS-CoV-2 target nucleic acids are DETECTED. The  SARS-CoV-2 RNA is generally detectable in upper and lower  respiratory specimens dur ing the acute phase of infection.  Positive  results are indicative of active infection with SARS-CoV-2.  Clinical  correlation with patient history and other diagnostic information is  necessary to determine patient infection status.  Positive results do  not rule out bacterial infection or co-infection with other viruses. If result is PRESUMPTIVE POSTIVE SARS-CoV-2 nucleic acids MAY BE PRESENT.   A presumptive positive result was obtained on the submitted specimen  and confirmed on repeat testing.  While 2019 novel coronavirus  (SARS-CoV-2) nucleic acids may be present in the submitted sample  additional confirmatory testing may be necessary for epidemiological  and / or clinical management purposes  to differentiate between  SARS-CoV-2 and other Sarbecovirus currently known to infect humans.  If clinically indicated additional testing with an alternate test  methodology [phone]([id number]) is advised. The SARS-CoV-2 RNA is generally  detectable in upper and lower respiratory sp ecimens during the acute  phase of infection. The expected result is Negative. Fact Sheet for Patients:  BoilerBrush.com.cyhttps://www.fda.gov/media/136312/download Fact Sheet for Healthcare Providers: https://pope.com/https://www.fda.gov/media/136313/download This test is not yet approved or cleared by the [place] FDA and has been authorized for detection and/or diagnosis of SARS-CoV-2 by FDA under an Emergency Use Authorization (EUA).  This EUA will remain in effect (meaning this test can be used) for the duration of the COVID-19 declaration under Section 564(b)(1) of the Act, 21 U.S.C. section 360bbb-3(b)(1), unless the authorization is terminated or revoked sooner. Performed at [facility], [address] [place], [place] [id number]     Medical History: Past Medical History:  Diagnosis Date  . Alzheimer  disease ([place])   . Arthritis   . Coronary artery  disease   . Diabetes mellitus   . Difficulty walking   . Hypertension   . Reflux   . Stroke [facility]     Medications:  Anti-infectives (From admission, onward)   Start     Dose/Rate Route Frequency Ordered Stop   02/15/19 0800  piperacillin-tazobactam (ZOSYN) IVPB 3.375 g     3.375 g 12.5 mL/hr over 240 Minutes Intravenous Every 8 hours 02/15/19 0116     02/14/19 2230  piperacillin-tazobactam (ZOSYN) IVPB 3.375 g     3.375 g 100 mL/hr over 30 Minutes Intravenous  Once 02/14/19 2223 02/14/19 2350      Assessment: 78 yo female with suspected pancreatitis.  Pharmacy has been asked to dose zosyn to cover for possible cholecystitis.   Plan:  Preliminary review of pertinent patient information completed.  Protocol will be initiated with dose(s) of zosyn 3.375 grams Q8 hours, starting 8 hours after ED dose.  Forestine Na clinical pharmacist will complete review during morning rounds to assess patient and finalize treatment regimen if needed.  Naquan Garman, [place], RPH 02/15/2019,1:21 AM

## 2019-02-15 NOTE — H&P (Addendum)
TRH H&P    Patient Demographics:    Kristin Grant, is a 78 y.o. female  MRN: [id number]  DOB - Aug 24, 1941  Admit Date - 02/14/2019  Referring MD/NP/PA: Rodell Perna  Outpatient Primary MD for the patient is [place], Modena Nunnery, MD Rodell Perna -orthopedics  Patient coming from:  [facility]  Primary Emergency Contact: [place], [place] Phone: [phone] Relation: Son Secondary Emergency Contact: Arva Chafe Mobile Phone: [phone] Relation: Daughter   Chief complaint-  n/v   HPI:    Kristin Grant  is a 78 y.o. 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  [name],  Not in any obvious pain at this time.  No note of fever, chills, cough, cp, palp, diarrhea, brbpr, dysuria, hematuria.   In ED,  T 97.9  P 91  R 17  Bp 102/42  Pox 98%   covid 19 negative  Wbc 16.3, hgb 12.7, Plt 139 Na 139, K 3.5, Bun 25, Creatinine 0.73 Trop <0.03 Lactic acid 1.1  Ast 384, Alt 276, Alk phos 114, T. Bili 1.2 LDH 457 Lipase 3,121  Pt was given zosyn 3.375gm iv x1 in the ED,   ED spoke with Dr. Arnoldo Morale who will consult on the patient, appreciate input.   Pt will be admitted for pancreatitis,  ? Gallstone.      Review of systems:    In addition to the HPI above,  No Fever-chills, No Headache, No changes with Vision or hearing, No problems swallowing food or Liquids, No Chest pain, Cough or Shortness of Breath, No Blood in stool or Urine, No dysuria, No new skin rashes or bruises, No new joints pains-aches,  No new weakness, tingling, numbness in any extremity, No recent weight gain or loss, No polyuria, polydypsia or polyphagia, No significant Mental Stressors.  All  other systems reviewed and are negative.    Past History of the following :    Past Medical History:  Diagnosis Date  . Alzheimer disease ([place])   . Arthritis   . Coronary artery disease   . Diabetes mellitus   . Difficulty walking   . Hypertension   . Reflux   . Stroke [facility])       Past Surgical History:  Procedure Laterality Date  . APPENDECTOMY    . FINGER DEBRIDEMENT    . INTRAMEDULLARY (IM) NAIL INTERTROCHANTERIC Right 08/03/2018   Procedure: INTRAMEDULLARY (IM) NAIL INTERTROCHANTRIC;  Surgeon: Marybelle Killings, MD;  Location: [place];  Service: Orthopedics;  Laterality: Right;  . INTRAMEDULLARY (IM) NAIL INTERTROCHANTERIC Left 12/04/2018   Procedure: INTRAMEDULLARY (IM) NAIL INTERTROCHANTRIC;  Surgeon: Marybelle Killings, MD;  Location: [place];  Service: Orthopedics;  Laterality: Left;      Social History:      Social History   Tobacco Use  . Smoking status: Never Smoker  .  Smokeless tobacco: Never Used  Substance Use Topics  . Alcohol use: No       Family History :     Family History  Problem Relation Age of Onset  . Hypertension Mother   . Heart disease Mother   . Hypertension Father   . Heart disease Father   . Hypertension Sister   . Heart disease Sister   . Cancer Sister        BREAST   . Hypertension Brother   . Heart disease Brother        Home Medications:   Prior to Admission medications   Medication Sig Start Date End Date Taking? Authorizing Provider  albuterol (VENTOLIN HFA) 108 (90 Base) MCG/ACT inhaler Inhale 2 puffs into the lungs every 6 (six) hours as needed for wheezing or shortness of breath. 02/05/19  Yes [place], Modena Nunnery, MD  aspirin EC 81 MG tablet Take 1 tablet (81 mg total) by mouth daily. Patient taking differently: Take 325 mg by mouth daily.  02/05/19  Yes [place], Modena Nunnery, MD  clopidogrel (PLAVIX) 75 MG tablet TAKE 1 TABLET (25m) BY MOUTH ONCE DAILY. Patient taking differently: Take 75 mg by mouth daily.  02/05/19  Yes [place],  KModena Nunnery MD  Ferrous Sulfate (IRON) 325 (65 Fe) MG TABS Take 1 tablet (325 mg total) by mouth daily. 02/05/19  Yes [place], KModena Nunnery MD  insulin glargine (LANTUS) 100 UNIT/ML injection Inject 14 Units into the skin at bedtime.   Yes [provider]  insulin lispro (HUMALOG KWIKPEN) 100 UNIT/ML KwikPen Inject 0.06 mLs (6 Units total) into the skin 2 (two) times daily with a meal. Lunch and dinner. 02/05/19  Yes [place], KModena Nunnery MD  levothyroxine (SYNTHROID) 50 MCG tablet Take 1 tablet (50 mcg total) by mouth daily. 02/05/19  Yes [place], KModena Nunnery MD  linagliptin (TRADJENTA) 5 MG TABS tablet Take 1 tablet (5 mg total) by mouth daily. 02/05/19  Yes [place], KModena Nunnery MD  lisinopril (ZESTRIL) 40 MG tablet Take 1 tablet (40 mg total) by mouth daily. 02/05/19  Yes [place], KModena Nunnery MD  mirtazapine (REMERON) 7.5 MG tablet Take 1 tablet (7.5 mg total) by mouth at bedtime. 02/05/19  Yes [place], KModena Nunnery MD  Multiple Vitamin (DAILY VITE) TABS Take 1 tablet by mouth daily. 02/05/19  Yes [place], KModena Nunnery MD  propranolol (INDERAL) 40 MG tablet Take 1 tablet (40 mg total) by mouth 2 (two) times daily. 02/05/19  Yes [place], KModena Nunnery MD  NOVOFINE AUTOCOVER 30G X 8 MM MISC USE AS DIRECTED FOR INSULIN ADMINISTRATION 3 TIMES DAILY. Patient not taking: Reported on 02/14/2019 11/23/18   DAlycia Rossetti MD  traMADol (ULTRAM) 50 MG tablet Take 1 tablet (50 mg total) by mouth every 6 (six) hours as needed for moderate pain. Patient not taking: Reported on 02/14/2019 02/05/19   DAlycia Rossetti MD     Allergies:    No Known Allergies   Physical Exam:   Vitals  Blood pressure 137/60, pulse 91, temperature 97.9 F (36.6 C), temperature source Oral, height 5' 4" (1.626 m), SpO2 98 %.  1.  General: axox1  2. Psychiatric: euthymic  3. Neurologic: cn2-12 intact, reflexes 2+ symmetric, diffuse with no clonus, motor 5/5 in all 4 ext  4. HEENMT:  Anicteric, pupils 1.552msymmetric, direct,  consensual intact Mucous membranes dry Neck: no jvd  5. Respiratory : CTAB  6. Cardiovascular : rrr s1, s2, no m/g/r  7. Gastrointestinal:  Abd: soft, nt (  pt doesn't grimace to palpation), +bs, normoactive  8. Skin:  Ext: no c/c/e,  No rash, lipoma on right posterior scalp  9.Musculoskeletal:  Good ROM  No adenopathy    Data Review:    CBC Recent Labs  Lab 02/14/19 2004  WBC 16.3*  HGB 12.7  HCT 41.8  PLT 139*  MCV 88.4  MCH 26.8  MCHC 30.4  RDW 15.1   ------------------------------------------------------------------------------------------------------------------  Results for orders placed or performed during the hospital encounter of 02/14/19 (from the past 48 hour(s))  CBG monitoring, ED     Status: Abnormal   Collection Time: 02/14/19  7:53 PM  Result Value Ref Range   Glucose-Capillary 186 (H) 70 - 99 mg/dL  Comprehensive metabolic panel     Status: Abnormal   Collection Time: 02/14/19  8:04 PM  Result Value Ref Range   Sodium 139 135 - 145 mmol/L   Potassium 3.5 3.5 - 5.1 mmol/L   Chloride 105 98 - 111 mmol/L   CO2 25 22 - 32 mmol/L   Glucose, Bld 227 (H) 70 - 99 mg/dL   BUN 25 (H) 8 - 23 mg/dL   Creatinine, Ser 0.73 0.44 - 1.00 mg/dL   Calcium 8.4 (L) 8.9 - 10.3 mg/dL   Total Protein 5.8 (L) 6.5 - 8.1 g/dL   Albumin 3.4 (L) 3.5 - 5.0 g/dL   AST 384 (H) 15 - 41 U/L   ALT 276 (H) 0 - 44 U/L   Alkaline Phosphatase 114 38 - 126 U/L   Total Bilirubin 1.2 0.3 - 1.2 mg/dL   GFR calc non Af Amer >60 >60 mL/min   GFR calc Af Amer >60 >60 mL/min   Anion gap 9 5 - 15    Comment: Performed at [facility], [address], [place], [place] [address]  CBC     Status: Abnormal   Collection Time: 02/14/19  8:04 PM  Result Value Ref Range   WBC 16.3 (H) 4.0 - 10.5 K/uL   RBC 4.73 3.87 - 5.11 MIL/uL   Hemoglobin 12.7 12.0 - 15.0 g/dL   HCT 41.8 36.0 - 46.0 %   MCV 88.4 80.0 - 100.0 fL   MCH 26.8 26.0 - 34.0 pg   MCHC 30.4 30.0 - 36.0 g/dL   RDW 15.1  11.5 - 15.5 %   Platelets 139 (L) 150 - 400 K/uL   nRBC 0.0 0.0 - 0.2 %    Comment: Performed at [facility], [address], [place], [place] [address]  Protime-INR - (order if Patient is taking Coumadin / Warfarin)     Status: None   Collection Time: 02/14/19  8:04 PM  Result Value Ref Range   Prothrombin Time 14.1 11.4 - 15.2 seconds   INR 1.1 0.8 - 1.2    Comment: (NOTE) INR goal varies based on device and disease states. Performed at [facility], [address], [place],  [address]   Troponin I - ONCE - STAT     Status: None   Collection Time: 02/14/19  8:04 PM  Result Value Ref Range   Troponin I <0.03 <0.03 ng/mL    Comment: Performed at [facility], [address] [place],  [address]  Lipase, blood     Status: Abnormal   Collection Time: 02/14/19  8:04 PM  Result Value Ref Range   Lipase 3,121 (H) 11 - 51 U/L    Comment: RESULTS CONFIRMED BY MANUAL DILUTION Performed at [facility], [address], [place], [place]  [address]   Lactic acid, plasma     Status: None   Collection Time: 02/14/19  8:04 PM  Result Value Ref Range   Lactic Acid, Venous 1.1 0.5 - 1.9 mmol/L    Comment: Performed at [facility], [address], [place], [place] [address]  Lactate dehydrogenase     Status: Abnormal   Collection Time: 02/14/19  8:04 PM  Result Value Ref Range   LDH 457 (H) 98 - 192 U/L    Comment: Performed at [facility], [address] [place], [place] [address]  Lactic acid, plasma     Status: None   Collection Time: 02/14/19  9:54 PM  Result Value Ref Range   Lactic Acid, Venous 1.8 0.5 - 1.9 mmol/L    Comment: Performed at [facility], [address] [place], [place] [address]  SARS Coronavirus 2 (CEPHEID - Performed in [place] hospital lab), Hosp Order     Status: None   Collection Time: 02/14/19 10:20 PM   Specimen: Nasopharyngeal Swab  Result Value Ref Range   SARS Coronavirus 2 NEGATIVE NEGATIVE    Comment: (NOTE) If result is NEGATIVE  SARS-CoV-2 target nucleic acids are NOT DETECTED. The SARS-CoV-2 RNA is generally detectable in upper and lower  respiratory specimens during the acute phase of infection. The lowest  concentration of SARS-CoV-2 viral copies this assay can detect is 250  copies / mL. A negative result does not preclude SARS-CoV-2 infection  and should not be used as the sole basis for treatment or other  patient management decisions.  A negative result may occur with  improper specimen collection / handling, submission of specimen other  than nasopharyngeal swab, presence of viral mutation(s) within the  areas targeted by this assay, and inadequate number of viral copies  (<250 copies / mL). A negative result must be combined with clinical  observations, patient history, and epidemiological information. If result is POSITIVE SARS-CoV-2 target nucleic acids are DETECTED. The SARS-CoV-2 RNA is generally detectable in upper and lower  respiratory specimens dur ing the acute phase of infection.  Positive  results are indicative of active infection with SARS-CoV-2.  Clinical  correlation with patient history and other diagnostic information is  necessary to determine patient infection status.  Positive results do  not rule out bacterial infection or co-infection with other viruses. If result is PRESUMPTIVE POSTIVE SARS-CoV-2 nucleic acids MAY BE PRESENT.   A presumptive positive result was obtained on the submitted specimen  and confirmed on repeat testing.  While 2019 novel coronavirus  (SARS-CoV-2) nucleic acids may be present in the submitted sample  additional confirmatory testing may be necessary for epidemiological  and / or clinical management purposes  to differentiate between  SARS-CoV-2 and other Sarbecovirus currently known to infect humans.  If clinically indicated additional testing with an alternate test  methodology [phone]) is advised. The SARS-CoV-2 RNA is generally  detectable in upper  and lower respiratory sp ecimens during the acute  phase of infection. The expected result is Negative. Fact Sheet for Patients:  StrictlyIdeas.no Fact Sheet for Healthcare Providers: BankingDealers.co.za This test is not yet approved or cleared by the [place] FDA and has been authorized for detection and/or diagnosis of SARS-CoV-2 by FDA under an Emergency Use Authorization (EUA).  This EUA will remain in effect (meaning this test can be used) for the duration of the COVID-19 declaration under Section 564(b)(1) of the Act, 21 U.S.C. section 360bbb-3(b)(1), unless the authorization is terminated or revoked sooner.  Performed at [facility], [address], [place], [place] [address]     Chemistries  Recent Labs  Lab 02/14/19 2004  NA 139  K 3.5  CL 105  CO2 25  GLUCOSE 227*  BUN 25*  CREATININE 0.73  CALCIUM 8.4*  AST 384*  ALT 276*  ALKPHOS 114  BILITOT 1.2   ------------------------------------------------------------------------------------------------------------------  ------------------------------------------------------------------------------------------------------------------ GFR: CrCl cannot be calculated (Unknown ideal weight.). Liver Function Tests: Recent Labs  Lab 02/14/19 2004  AST 384*  ALT 276*  ALKPHOS 114  BILITOT 1.2  PROT 5.8*  ALBUMIN 3.4*   Recent Labs  Lab 02/14/19 2004  LIPASE 3,121*   No results for input(s): AMMONIA in the last 168 hours. Coagulation Profile: Recent Labs  Lab 02/14/19 2004  INR 1.1   Cardiac Enzymes: Recent Labs  Lab 02/14/19 2004  TROPONINI <0.03   BNP (last 3 results) No results for input(s): PROBNP in the last 8760 hours. HbA1C: No results for input(s): HGBA1C in the last 72 hours. CBG: Recent Labs  Lab 02/14/19 1953  GLUCAP 186*   Lipid Profile: No results for input(s): CHOL, HDL, LDLCALC, TRIG, CHOLHDL, LDLDIRECT in the last 72 hours.  Thyroid Function Tests: No results for input(s): TSH, T4TOTAL, FREET4, T3FREE, THYROIDAB in the last 72 hours. Anemia Panel: No results for input(s): VITAMINB12, FOLATE, FERRITIN, TIBC, IRON, RETICCTPCT in the last 72 hours.  --------------------------------------------------------------------------------------------------------------- Urine analysis:    Component Value Date/Time   COLORURINE YELLOW 12/06/2018 0640   APPEARANCEUR HAZY (A) 12/06/2018 0640   LABSPEC 1.020 12/06/2018 0640   PHURINE 5.0 12/06/2018 0640   GLUCOSEU NEGATIVE 12/06/2018 0640   HGBUR NEGATIVE 12/06/2018 0640   BILIRUBINUR NEGATIVE 12/06/2018 0640   KETONESUR 5 (A) 12/06/2018 0640   PROTEINUR NEGATIVE 12/06/2018 0640   UROBILINOGEN 0.2 10/11/2013 0634   NITRITE NEGATIVE 12/06/2018 0640   LEUKOCYTESUR MODERATE (A) 12/06/2018 0640      Imaging Results:    Ct Abdomen Pelvis W Contrast  Result Date: 02/14/2019 CLINICAL DATA:  Vomiting. Confusion. Upper abdominal tenderness. EXAM: CT ABDOMEN AND PELVIS WITH CONTRAST TECHNIQUE: Multidetector CT imaging of the abdomen and pelvis was performed using the standard protocol following bolus administration of intravenous contrast. CONTRAST:  126m OMNIPAQUE IOHEXOL 300 MG/ML  SOLN COMPARISON:  None. FINDINGS: Lower chest: 6 mm perifissural nodule in the right lower lobe. Bilateral lower lobe atelectasis, left greater than right. There are coronary artery calcifications. Mitral annulus calcifications. Hepatobiliary: No focal hepatic abnormality. Mild periportal edema. Distended gallbladder. Pericholecystic haziness with possible gallbladder wall thickening no calcified gallstone. Common bile duct measures 7 mm. Pancreas: Mild peripancreatic edema about the head versus motion artifact. No pancreatic ductal dilatation. Spleen: Elongated spanning 15 cm cranial caudal. 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 Questionable additional splenic artery  aneurysm is inferiorly as well as adjacent to the larger lesion. No suspicious splenic lesion. Adrenals/Urinary Tract: Normal adrenal glands. No hydronephrosis or perinephric edema. Homogeneous renal enhancement with symmetric excretion on delayed phase imaging. Mild lobular renal contours bilaterally. Urinary bladder is partially distended, there is mild bladder wall thickening. Stomach/Bowel: Nondistended stomach. No small bowel obstruction or inflammation. Appendix not visualized, history of appendectomy. Diverticulosis in sigmoid colon without diverticulitis. Stool within the rectum with mild rectal wall thickening, small volume of colonic stool elsewhere. Vascular/Lymphatic: Aorto bi-iliac atherosclerosis. No aneurysm. No adenopathy. Reproductive: Uterus and bilateral adnexa are unremarkable. Other: No free air, free fluid, or intra-abdominal fluid collection. Musculoskeletal: Severe L1 compression fracture with posterior cortical  buckling, chronic based on lumbar spine radiographs from 2019. Remote bilateral superior and inferior pubic rami fractures. Findings suspicious for remote right possibly left sacral insufficiency fractures. Bones are significantly under mineralized. Surgical fixation of bilateral proximal femurs. IMPRESSION: 1. Distended gallbladder with pericholecystic haziness and possible gallbladder wall thickening. Findings suspicious for acute cholecystitis. Recommend further evaluation with right upper quadrant ultrasound. 2. Mild peripancreatic edema about the head of the pancreas versus motion artifact. Recommend correlation with pancreatic enzymes to exclude acute pancreatitis. 3. 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 Recommend annual cross-sectional imaging surveillance, with consideration given to patient's age and demographics. This recommendation follows ACR consensus guidelines: White Paper of the ACR Incidental Findings Committee II on Vascular Findings. J Am Coll Radiol  2013;10:789-794. 4. Mild urinary bladder wall thickening, recommend correlation with urinalysis to exclude urinary tract infection. 5. Sigmoid colonic diverticulosis without diverticulitis. 6. Right lower lobe 6 mm perifissural nodule. Non-contrast chest CT at 6-12 months is recommended. If the nodule is stable at time of repeat CT, then future CT at 18-24 months (from today's scan) is considered optional for low-risk patients, but is recommended for high-risk patients. This recommendation follows the consensus statement: Guidelines for Management of Incidental Pulmonary Nodules Detected on CT Images: From the Fleischner Society 2017; Radiology 2017; 284:228-243. 7.  Aortic Atherosclerosis (ICD10-I70.0). Electronically Signed   By: Keith Rake M.D.   On: 02/14/2019 22:04   Dg Chest Portable 1 View  Result Date: 02/14/2019 CLINICAL DATA:  Shortness of breath, emesis. EXAM: PORTABLE CHEST 1 VIEW COMPARISON:  12/05/2018 FINDINGS: Cardiomediastinal silhouette is normal. Mediastinal contours appear intact. Calcific atherosclerotic disease of the aorta. There is no evidence of focal airspace consolidation, pleural effusion or pneumothorax. Osseous structures are without acute abnormality. Soft tissues are grossly normal. IMPRESSION: No active disease. Electronically Signed   By: Fidela Salisbury M.D.   On: 02/14/2019 20:34       Assessment & Plan:    Principal Problem:   Acute pancreatitis Active Problems:   Hypertension   Hypothyroidism   Diabetes mellitus, type II (HCC)   Abnormal liver function  Acute pancreatitis ? Gallstone NPO, hydrate with ns iv RUQ ultrasound Check LDH, Lipid for [place] Criteria Morphine sulfate 0.86m iv q6h prn severe pain Zofran 447miv q6h prn n/v Zosyn iv pharmacy to dose to cover for possible cholecystitis GI consult also requested in computer Surgery consult appreciated Check cbc, cmp , lipase in am  Abnormal liver function Check RUQ ultrasound Check  acute hepatitis panel STOP Mirtazapine as this can affect liver function Check cmp in am  Dm2 STOP Tradjenta => associated with pancreatitis STOP Lantus, Novolog fsbs q4h, ISS  H/o CVA Cont aspirin HOLD Plavix for now, if not going to have surgery for gallstone pancreatitis then please restart in AM  Dementia (baseline ambulates with walker) STOP mirtazepine as above  Hypothyroidism Cont Levothyroxine 50 micrograms po qday  Hypertension STOP Lisinopril STOP Propranolol Metoprolol 2.43m243mv q6h (hold sbp <100, hr <60  Moderate protein calorie malnutrition Please address after pancreatitis resolves  Severe compression fracture L1 Check vitamin D level Please obtain bone density test as outpatient Will need calcium and vitamin D and antiresorptive agent Would avoid Bisphosphonate due to hx of GERD, probably would benefit from Prolia 64m69m q6 months   Right Lower Lung 6mm 36mule on CT scan Please repeat CT chest noncontrast in 6-12 months  Splenic artery aneurysm Please curbside vascular regarding whether needs follow up in AM  DVT Prophylaxis-    SCDs only in case needs surgery for ? Gallstone pancreatitis ? cholecystitis.  If not proceeding to surgery then please start lovenox  AM Labs Ordered, also please review Full Orders  Family Communication: Admission, patients condition and plan of care including tests being ordered have been discussed with the patient and d/w son and his wife who indicate understanding and agree with the plan and Code Status.  Code Status:  FULL CODE, notified son, son's wife that patient admitted in hospital for acute pancreatitis, son will try to come visit later today  Admission status: Inpatient: Based on patients clinical presentation and evaluation of above clinical data, I have made determination that patient meets Inpatient criteria at this time.  Pt has high risk of clinical deterioration due to age and severity of acute pancreatitis ?  Cholecystitis.  Pt will require > 2 nites stay for iv hydration and abx.   Time spent in minutes :  70 minutes   Jani Gravel M.D on 02/15/2019 at 12:11 AM

## 2019-02-16 ENCOUNTER — Inpatient Hospital Stay (HOSPITAL_COMMUNITY): Payer: Medicare Other

## 2019-02-16 DIAGNOSIS — K859 Acute pancreatitis without necrosis or infection, unspecified: Secondary | ICD-10-CM

## 2019-02-16 DIAGNOSIS — R945 Abnormal results of liver function studies: Secondary | ICD-10-CM

## 2019-02-16 LAB — COMPREHENSIVE METABOLIC PANEL
ALT: 238 U/L — ABNORMAL HIGH (ref 0–44)
AST: 122 U/L — ABNORMAL HIGH (ref 15–41)
Albumin: 2.8 g/dL — ABNORMAL LOW (ref 3.5–5.0)
Alkaline Phosphatase: 114 U/L (ref 38–126)
Anion gap: 6 (ref 5–15)
BUN: 15 mg/dL (ref 8–23)
CO2: 28 mmol/L (ref 22–32)
Calcium: 7.7 mg/dL — ABNORMAL LOW (ref 8.9–10.3)
Chloride: 113 mmol/L — ABNORMAL HIGH (ref 98–111)
Creatinine, Ser: 0.6 mg/dL (ref 0.44–1.00)
GFR calc Af Amer: >60 mL/min (ref >60)
GFR calc non Af Amer: >60 mL/min (ref >60)
Glucose, Bld: 104 mg/dL — ABNORMAL HIGH (ref 70–99)
Potassium: 3.3 mmol/L — ABNORMAL LOW (ref 3.5–5.1)
Sodium: 147 mmol/L — ABNORMAL HIGH (ref 135–145)
Total Bilirubin: 1.5 mg/dL — ABNORMAL HIGH (ref 0.3–1.2)
Total Protein: 5.2 g/dL — ABNORMAL LOW (ref 6.5–8.1)

## 2019-02-16 LAB — URINALYSIS, ROUTINE W REFLEX MICROSCOPIC
Bacteria, UA: NONE SEEN
Bilirubin Urine: NEGATIVE
Glucose, UA: NEGATIVE mg/dL
Ketones, ur: NEGATIVE mg/dL
Nitrite: NEGATIVE
Protein, ur: NEGATIVE mg/dL
Specific Gravity, Urine: 1.019 (ref 1.005–1.030)
WBC, UA: >50 WBC/hpf — ABNORMAL HIGH (ref 0–5)
pH: 5 (ref 5.0–8.0)

## 2019-02-16 LAB — CBC
HCT: 34.7 % — ABNORMAL LOW (ref 36.0–46.0)
Hemoglobin: 10.5 g/dL — ABNORMAL LOW (ref 12.0–15.0)
MCH: 27.2 pg (ref 26.0–34.0)
MCHC: 30.3 g/dL (ref 30.0–36.0)
MCV: 89.9 fL (ref 80.0–100.0)
Platelets: 88 10*3/uL — ABNORMAL LOW (ref 150–400)
RBC: 3.86 MIL/uL — ABNORMAL LOW (ref 3.87–5.11)
RDW: 15.3 % (ref 11.5–15.5)
WBC: 8.1 10*3/uL (ref 4.0–10.5)
nRBC: 0 % (ref 0.0–0.2)

## 2019-02-16 LAB — GLUCOSE, CAPILLARY
Glucose-Capillary: 106 mg/dL — ABNORMAL HIGH (ref 70–99)
Glucose-Capillary: 108 mg/dL — ABNORMAL HIGH (ref 70–99)
Glucose-Capillary: 123 mg/dL — ABNORMAL HIGH (ref 70–99)
Glucose-Capillary: 67 mg/dL — ABNORMAL LOW (ref 70–99)
Glucose-Capillary: 90 mg/dL (ref 70–99)
Glucose-Capillary: 90 mg/dL (ref 70–99)
Glucose-Capillary: 93 mg/dL (ref 70–99)

## 2019-02-16 LAB — HEPATITIS PANEL, ACUTE
HCV Ab: <0.1 s/co ratio (ref 0.0–0.9)
Hep A IgM: NEGATIVE
Hep B C IgM: NEGATIVE
Hepatitis B Surface Ag: NEGATIVE

## 2019-02-16 LAB — LIPASE, BLOOD: Lipase: 97 U/L — ABNORMAL HIGH (ref 11–51)

## 2019-02-16 MED ORDER — PANTOPRAZOLE SODIUM 40 MG PO TBEC
40.0000 mg | DELAYED_RELEASE_TABLET | Freq: Every day | ORAL | Status: DC
  Filled 2019-02-16: qty 1

## 2019-02-16 MED ORDER — ACETAMINOPHEN 325 MG PO TABS
325.0000 mg | ORAL_TABLET | Freq: Once | ORAL | Status: AC
  Administered 2019-02-16: 325 mg via ORAL
  Filled 2019-02-16: qty 1

## 2019-02-16 MED ORDER — FAMOTIDINE IN NACL 20-0.9 MG/50ML-% IV SOLN
20.0000 mg | INTRAVENOUS | Status: DC
  Administered 2019-02-17: 08:00:00 20 mg via INTRAVENOUS
  Filled 2019-02-16: qty 50

## 2019-02-16 MED ORDER — GLUCOSE 40 % PO GEL
ORAL | Status: AC
  Administered 2019-02-16: 11:00:00
  Filled 2019-02-16: qty 1

## 2019-02-16 NOTE — TOC Initial Note (Signed)
Transition of Care [facility](TOC) - Initial/Assessment Note    Patient Details  Name: Kristin Grant MRN: [id number] Date of Birth: 09/14/1940  Transition of Care [facility](TOC) CM/SW Contact:    Ida Rogueodney B Derry Kassel, LCSW Phone Number: 02/16/2019, 4:35 PM  Clinical Narrative:    Pt has been resident of [facility] for over a year, has dementia but is pleasant for the most part.  Uses walker for ambulation; requires supervision for ADL's.  Will return there at d/c and [facility] up with [facility]ment scheduled.  Facility will transport home at d/c.               Expected Discharge Plan: Assisted Living Barriers to Discharge: No Barriers Identified   Patient Goals and CMS Choice        Expected Discharge Plan and Services Expected Discharge Plan: Assisted Living   Discharge Planning Services: CM Consult   Living arrangements for the past 2 months: Assisted Living Facility Expected Discharge Date: 02/15/19                                    Prior Living Arrangements/Services Living arrangements for the past 2 months: Assisted Living Facility Lives with:: Other (Comment) Patient language and need for interpreter reviewed:: Yes        Need for Family Participation in Patient Care: No (Comment) Care giver support system in place?: Yes (comment) Current home services: DME Criminal Activity/Legal Involvement Pertinent to Current Situation/Hospitalization: No - Comment as needed  Activities of Daily Living Home Assistive Devices/Equipment: Other (Comment)(unknown) ADL Screening (condition at time of admission) Patient's cognitive ability adequate to safely complete daily activities?: No Does the patient have difficulty concentrating, remembering, or making decisions?: Yes Patient able to express need for assistance with ADLs?: No Independently performs ADLs?: No Communication: (pt unresponsice) Dressing (OT): (pt unresponsive) Grooming: (pt unresponsive) Feeding:  (pt unresponsive) Bathing: (pt unresponsive) Toileting: (pt unresponsive) In/Out Bed: (unresponsive) Walks in Home: (unresponsive) Does the patient have difficulty walking or climbing stairs?: (pt unable to respond) Weakness of Legs: (pt unable to respond) Weakness of Arms/Hands: (pt unable to respond)  Permission Sought/Granted Permission sought to share information with : Other (comment) Permission granted to share information with : Yes, Verbal Permission Granted  Share Information with NAME: Staff at [facility]           Emotional Assessment Appearance:: Appears stated age Attitude/Demeanor/Rapport: Lethargic Affect (typically observed): Calm Orientation: : Oriented to Self Alcohol / Substance Use: Not Applicable Psych Involvement: No (comment)  Admission diagnosis:  Acute cholecystitis [K81.0] Abnormal liver function [R94.5] Acute biliary pancreatitis, unspecified complication status [K85.10] Acute pancreatitis [K85.90] Patient Active Problem List   Diagnosis Date Noted  . Abnormal liver function 02/15/2019  . Acute pancreatitis 02/14/2019  . Intertrochanteric fracture of left hip (HCC) 12/04/2018  . Intertrochanteric fracture of left hip, closed, initial encounter (HCC) 12/04/2018  . Acute blood loss anemia 08/05/2018  . Closed intertrochanteric fracture of hip, right, initial encounter (HCC) 08/03/2018  . Malnutrition of moderate degree 08/03/2018  . Coronary artery disease 11/17/2017  . Thrombocytopenia (HCC) 11/17/2017  . Cerebral atrophy (HCC) 11/17/2017  . Hypokalemia 11/17/2017  . History of CVA (cerebrovascular accident) 11/15/2017  . Suspected stroke patient last known to be well 3 to 4.5 hours ago   . Closed fracture of proximal end of right humerus 09/18/17 10/16/2017  . Osteoporosis 01/09/2017  . Loss of  weight 01/12/2016  . 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 Diabetes mellitus, type II ([place]) 06/22/2014  . Protein-calorie  malnutrition ([place]) 12/15/2013  . Fall 08/17/2013  . Back pain 08/17/2013  . Abnormal development of nail 03/28/2013  . Gait abnormality 12/10/2011  . Dementia ([place]) 11/21/2011  . Hypertension 11/21/2011  . Hypothyroidism 11/21/2011   PCP:  [name], MD Pharmacy:   [facility], [place] [address] [place] [address] Phone: [phone] Fax: [phone]     Social Determinants of Health (SDOH) Interventions    Readmission Risk Interventions No flowsheet data found.

## 2019-02-16 NOTE — Progress Notes (Addendum)
REVIEWED. Pt needs PROTONIX DAILY TO PREVENT GASTRITIS/PUD. AWAITING SURGERY DECISION REGARDING CHOLECYSTECTOMY.   Subjective: Patient is agitated this morning. When I entered the room, Kristin Grant was trying to get out of bed, taking her clothes off, stating Kristin Grant needed to get out of here. Kristin Grant is oriented to person, know Kristin Grant is at a hosptial, and year. Doesn't know why Kristin Grant is here. Denies nausea, vomiting, abdominal pain. Stating Kristin Grant is needing something to drink. Her mouth is visibly dry. I asked nursing staff to swab her mouth. Spoke with nursing staff who states patient tried pulling out IV's last night and was very agitated. Kristin Grant has not had a BM since admission. Denies shortness of breath, cough, and chest pain.    Objective: Vital signs in last 24 hours: Temp:  [99.6 F (37.6 C)-101.3 F (38.5 C)] 101.3 F (38.5 C) (06/23 0504) Pulse Rate:  [70-96] 95 (06/23 0504) Resp:  [16-19] 17 (06/23 0504) BP: (114-136)/(49-93) 129/56 (06/23 0504) SpO2:  [92 %-97 %] 94 % (06/23 0504) Last BM Date: 02/14/19 General:   More alert than yesterday.  Agitated today trying to get out of bed and is taking her clothes off. Eyes:  No icterus, sclera clear. Conjuctiva pink.  Mouth:  Oral mucosa is dry. Kristin Grant is NPO at this time and is receiving fluids. Asked nursing staff to swab her mouth.   Abdomen:  Bowel sounds present, soft, non-tender, non-distended. No HSM or hernias noted. No rebound or guarding. No masses appreciated  Neurologic:  Alert and  oriented to self and year. Knows Kristin Grant is at a hospital ;  grossly normal neurologically. Skin:  Skin is very warm to touch . Kristin Grant has a fever documented.  Psych:  Alert and cooperative. Normal mood and affect.  Intake/Output from previous day: 06/22 0701 - 06/23 0700 In: 1796.8 [I.V.:1663.3; IV Piggyback:133.5] Out: 400 [Urine:400] Intake/Output this shift: No intake/output data recorded.  Lab Results: Recent Labs    02/14/19 2004 02/15/19 0416 02/16/19 0551   WBC 16.3* 8.3 8.1  HGB 12.7 11.8* 10.5*  HCT 41.8 37.8 34.7*  PLT 139* 110* 88*   BMET Recent Labs    02/14/19 2004 02/15/19 0416 02/16/19 0551  NA 139 143 147*  K 3.5 3.2* 3.3*  CL 105 105 113*  CO2 25 27 28   GLUCOSE 227* 187* 104*  BUN 25* 25* 15  CREATININE 0.73 0.69 0.60  CALCIUM 8.4* 8.3* 7.7*   LFT Recent Labs    02/14/19 2004 02/15/19 0416 02/16/19 0551  PROT 5.8* 5.5* 5.2*  ALBUMIN 3.4* 3.2* 2.8*  AST 384* 406* 122*  ALT 276* 432* 238*  ALKPHOS 114 133* 114  BILITOT 1.2 1.0 1.5*   PT/INR Recent Labs    02/14/19 2004  LABPROT 14.1  INR 1.1   Hepatitis Panel Recent Labs    02/15/19 0416  HEPBSAG Negative  HCVAB <0.1  HEPAIGM Negative  HEPBIGM Negative    Studies/Results: Ct Head Wo Contrast  Result Date: 02/15/2019 CLINICAL DATA:  Altered mental status. EXAM: CT HEAD WITHOUT CONTRAST TECHNIQUE: Contiguous axial images were obtained from the base of the skull through the vertex without intravenous contrast. COMPARISON:  12/06/2018. FINDINGS: Brain: No evidence for acute infarction, hemorrhage, mass lesion, hydrocephalus, or extra-axial fluid. Generalized atrophy. Hypoattenuation of white matter, likely small vessel disease. Vascular: Calcification of the cavernous internal carotid arteries and distal vertebral arteries, consistent with cerebrovascular atherosclerotic disease. No signs of intracranial large vessel occlusion. Skull: Calvarium intact. Sinuses/Orbits: Chronic RIGHT maxillary sinusitis. Negative orbits.  Other: None. IMPRESSION: Atrophy and small vessel disease. No acute intracranial findings. Electronically Signed   By: Elsie StainJohn T Curnes M.D.   On: 02/15/2019 10:04   Ct Angio Chest Pe W Or Wo Contrast  Result Date: 02/15/2019 CLINICAL DATA:  Tachycardia, hypoxia EXAM: CT ANGIOGRAPHY CHEST WITH CONTRAST TECHNIQUE: Multidetector CT imaging of the chest was performed using the standard protocol during bolus administration of intravenous contrast.  Multiplanar CT image reconstructions and MIPs were obtained to evaluate the vascular anatomy. CONTRAST:  75mL OMNIPAQUE IOHEXOL 350 MG/ML SOLN COMPARISON:  None. FINDINGS: Cardiovascular: Satisfactory opacification of the pulmonary arteries to the segmental level. No evidence of pulmonary embolism. Normal heart size. No pericardial effusion. Thoracic aortic atherosclerosis. Multi vessel coronary artery atherosclerosis. Mediastinum/Nodes: No enlarged mediastinal, hilar, or axillary lymph nodes. Thyroid gland, trachea, and esophagus demonstrate no significant findings. Lungs/Pleura: Bibasilar at airspace disease likely reflecting atelectasis. No pleural effusion or pneumothorax. No other focal parenchymal airspace disease. Upper Abdomen: No acute abnormality. Musculoskeletal: No acute osseous abnormality. No aggressive osseous lesion. T9 vertebral body sclerotic bone lesion most consistent with a bone island. Chronic L1 vertebral body compression fracture. Mild osteoarthritis of bilateral glenohumeral joints. Review of the MIP images confirms the above findings. IMPRESSION: 1. No evidence of pulmonary embolus. 2. Bibasilar atelectasis. 3.  Aortic Atherosclerosis (ICD10-I70.0). Electronically Signed   By: Elige KoHetal  Patel   On: 02/15/2019 10:07   Ct Abdomen Pelvis W Contrast  Result Date: 02/14/2019 CLINICAL DATA:  Vomiting. Confusion. Upper abdominal tenderness. EXAM: CT ABDOMEN AND PELVIS WITH CONTRAST TECHNIQUE: Multidetector CT imaging of the abdomen and pelvis was performed using the standard protocol following bolus administration of intravenous contrast. CONTRAST:  100mL OMNIPAQUE IOHEXOL 300 MG/ML  SOLN COMPARISON:  None. FINDINGS: Lower chest: 6 mm perifissural nodule in the right lower lobe. Bilateral lower lobe atelectasis, left greater than right. There are coronary artery calcifications. Mitral annulus calcifications. Hepatobiliary: No focal hepatic abnormality. Mild periportal edema. Distended  gallbladder. Pericholecystic haziness with possible gallbladder wall thickening no calcified gallstone. Common bile duct measures 7 mm. Pancreas: Mild peripancreatic edema about the head versus motion artifact. No pancreatic ductal dilatation. Spleen: Elongated spanning 15 cm cranial caudal. 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 Questionable additional splenic artery aneurysm is inferiorly as well as adjacent to the larger lesion. No suspicious splenic lesion. Adrenals/Urinary Tract: Normal adrenal glands. No hydronephrosis or perinephric edema. Homogeneous renal enhancement with symmetric excretion on delayed phase imaging. Mild lobular renal contours bilaterally. Urinary bladder is partially distended, there is mild bladder wall thickening. Stomach/Bowel: Nondistended stomach. No small bowel obstruction or inflammation. Appendix not visualized, history of appendectomy. Diverticulosis in sigmoid colon without diverticulitis. Stool within the rectum with mild rectal wall thickening, small volume of colonic stool elsewhere. Vascular/Lymphatic: Aorto bi-iliac atherosclerosis. No aneurysm. No adenopathy. Reproductive: Uterus and bilateral adnexa are unremarkable. Other: No free air, free fluid, or intra-abdominal fluid collection. Musculoskeletal: Severe L1 compression fracture with posterior cortical buckling, chronic based on lumbar spine radiographs from 2019. Remote bilateral superior and inferior pubic rami fractures. Findings suspicious for remote right possibly left sacral insufficiency fractures. Bones are significantly under mineralized. Surgical fixation of bilateral proximal femurs. IMPRESSION: 1. Distended gallbladder with pericholecystic haziness and possible gallbladder wall thickening. Findings suspicious for acute cholecystitis. Recommend further evaluation with right upper quadrant ultrasound. 2. Mild peripancreatic edema about the head of the pancreas versus  motion artifact. Recommend correlation with pancreatic enzymes to exclude acute pancreatitis. 3. 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 Recommend annual cross-sectional imaging surveillance, with consideration given to patient's age and demographics. This recommendation follows ACR consensus guidelines: White Paper of the ACR Incidental Findings Committee II on Vascular Findings. J Am Coll Radiol 2013;10:789-794. 4. Mild urinary bladder wall thickening, recommend correlation with urinalysis to exclude urinary tract infection. 5. Sigmoid colonic diverticulosis without diverticulitis. 6. Right lower lobe 6 mm perifissural nodule. Non-contrast chest CT at 6-12 months is recommended. If the nodule is stable at time of repeat CT, then future CT at 18-24 months (from today's scan) is considered optional for low-risk patients, but is recommended for high-risk patients. This recommendation follows the consensus statement: Guidelines for Management of Incidental Pulmonary Nodules Detected on CT Images: From the Fleischner Society 2017; Radiology 2017; 284:228-243. 7.  Aortic Atherosclerosis (ICD10-I70.0). Electronically Signed   By: Narda RutherfordMelanie  Sanford M.D.   On: 02/14/2019 22:04   Dg Chest Port 1 View  Result Date: 02/15/2019 CLINICAL DATA:  Hypoxia. EXAM: PORTABLE CHEST 1 VIEW COMPARISON:  Radiograph February 14, 2019. FINDINGS: The heart size and mediastinal contours are within normal limits. No pneumothorax is noted. Increased right basilar atelectasis or infiltrate is noted. Probable minimal bilateral pleural effusions are noted. The visualized skeletal structures are unremarkable. IMPRESSION: Increased right basilar subsegmental atelectasis or infiltrate. Probable minimal pleural effusions. Electronically Signed   By: Lupita RaiderJames  Green Jr M.D.   On: 02/15/2019 07:14   Dg Chest Portable 1 View  Result Date: 02/14/2019 CLINICAL DATA:  Shortness of breath, emesis. EXAM: PORTABLE CHEST 1 VIEW COMPARISON:   12/05/2018 FINDINGS: Cardiomediastinal silhouette is normal. Mediastinal contours appear intact. Calcific atherosclerotic disease of the aorta. There is no evidence of focal airspace consolidation, pleural effusion or pneumothorax. Osseous structures are without acute abnormality. Soft tissues are grossly normal. IMPRESSION: No active disease. Electronically Signed   By: Ted Mcalpineobrinka  Dimitrova M.D.   On: 02/14/2019 20:34   [place] Abdomen Limited Ruq  Result Date: 02/15/2019 CLINICAL DATA:  Abnormal liver function test. EXAM: ULTRASOUND ABDOMEN LIMITED RIGHT UPPER QUADRANT COMPARISON:  CT scan of February 14, 2019. FINDINGS: Gallbladder: Multiple gallstones are noted with the largest measuring 1.7 cm. Sludge is noted. Gallbladder wall is significantly thickened at 8 mm. Mild pericholecystic fluid is noted. No sonographic Murphy's sign is noted. Common bile duct: Diameter: 4 mm which is within normal limits. Liver: No focal lesion identified. Within normal limits in parenchymal echogenicity. Portal vein is patent on color Doppler imaging with normal direction of blood flow towards the liver. IMPRESSION: Cholelithiasis and sludge is noted within gallbladder lumen with significant gallbladder wall thickening and pericholecystic fluid concerning for acute cholecystitis. Clinical correlation is recommended. Electronically Signed   By: Lupita RaiderJames  Green Jr M.D.   On: 02/15/2019 09:19    Assessment: Kristin Grant is a 78 y.o. 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 Presented to the ED on 02/14/19 from [facility] nursing facility with nausea, vomiting, and abdominal pain. Admitted with acute pancreatitis and elevated LFTs.   Acute Pancreatitis: Suspect biliary etiology. US with confirmed cholelithiasis and likely acute cholecystitis. US and CT without CBD dilation. Pancreatitis improving. Patient is not  symptomatic at this time. Lipase trending down at 97 today. BUN 15. Hgb down at 10.5 and hematocrit at 34.7, this is likely dilutional. AST/ALT are still elevated but trending down and dropped by half. Bilirubin is slightly elevated at 1.5. At this time patient  is still NPO and receiving IV fluids pending surgical consult to determine timing of cholecystectomy. From a pancreatitis standpoint, patient could increase her diet to clear liquids.  Elevated LFTs: Likely biliary etiology. [place] with documented gallstones and sludge, and suspicious for acute cholecystitis. No evidence of CBD dilation on imaging. Dr. Arnoldo Morale has been consulted. LFTs trending down. Bilirubin slightly elevated at 1.5. I do not think Kristin Grant has a stone in her CBD as her LFTs are decreasing. Suspect elevated bilirubin is possibly a lag effect. Of note patient does have a fever today. CXR negative, her urine is dark and urinalysis is pending, can't rule out possible developing cholangitis. Patient is already on Zosyn. Hepatitis panel negative.  Acute Cholecystitis: WBC within normal limits, but with fever today as described above. On Zosyn. Patient will need cholecystectomy. Dr. Arnoldo Morale has been consulted.   Plan: Continue NPO status until further discussion with Dr. Arnoldo Morale.  Continue IV fluids  Continue to follow LFTs At this time, I do not think Kristin Grant needs any further imaging to evaluate bile ducts; however, I will discuss this with Dr. Oneida Alar as well as Dr. Arnoldo Morale to see if he needs this prior to surgery. Continue Zosyn    LOS: 2 days    02/16/2019, 8:52 AM   Aliene Altes, PA-C [facility] Gastroenterology

## 2019-02-16 NOTE — Progress Notes (Signed)
Has been alert and oriented x 2 today. Pulling at [place] so it was removed.  [place] put on and she asked if she could take them off.  Tech attempted to give clear liquids at lunch as reported that patient choked.  Just attempted water with straw and she choked.  Notified Dr. Marlowe Sax and he switched back to NPO and ordered SLP

## 2019-02-16 NOTE — Progress Notes (Addendum)
[facility] Progress Note  Patient: Kristin Grant GQQ:[id number]   PCP: [name], MD DOB: 07/11/1941   DOA: 02/14/2019   DOS: 02/16/2019   Date of Service: the patient was seen and examined on 02/16/2019  Brief hospital course: Pt. 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 Currently further plan is continue conservative measures.  Subjective: Occasionally confused.  More fatigue.  Denies any acute complaint.  No nausea no vomiting.  Reportedly patient chokes while she is trying to eat and swallow.  Assessment and Plan: Acute pancreatitis Acute cholecystitis NPO, hydrate with ns iv RUQ ultrasound shows acute cholecystitis Morphine for pain control Zofran 4mg  iv q6h prn n/v Zosyn iv pharmacy to dose to cover for cholecystitis Appreciate GI and general surgery consultation. No indication for MRCP right now.  LFTs are getting better.  Lipase is now in 30s from 3000. Eventually will require surgery but currently managing conservatively given her comorbidities.  Abnormal liver function RUQ ultrasound shows no evidence of CBD obstruction. acute hepatitis panel pending STOP Mirtazapine as this can affect liver function  Type 2 diabetes mellitus, Uncontrolled with hyperglycemia.  No complication. STOP Tradjenta => associated with pancreatitis STOP Lantus, Novolog fsbs q4h, ISS Currently on D5.  H/o CVA Cont aspirin HOLD Plavix for now,   Dementia (baseline ambulates with walker) Acute metabolic encephalopathy with delirium STOP mirtazepine as above Close monitoring for now.  Hypothyroidism Cont Levothyroxine 50 micrograms po qday  Hypertension STOP Lisinopril STOP Propranolol Metoprolol 2.5mg  iv q6h (hold sbp <100, hr <60  Moderate protein calorie  malnutrition Please address after pancreatitis resolves  Severe compression fracture L1 Check vitamin D level Please obtain bone density test as outpatient Will need calcium and vitamin D and antiresorptive agent Would avoid Bisphosphonate due to hx of GERD, probably would benefit from Prolia 60mg  [place] q6 months   Right Lower Lung 58mm nodule on CT scan Please repeat CT chest noncontrast in 6-12 months  Splenic artery aneurysm Please curbside vascular regarding whether needs follow up in AM  Dysphagia. Speech therapy consulted.  Currently n.p.o.  Diet: NPO  DVT Prophylaxis: SCD, pharmacological prophylaxis contraindicated due to Pending procedure  Advance goals of care discussion: Full code  Family Communication: no family was present at bedside, at the time of interview.  Discussed with family on 02/15/2019 as well as 02/16/2019.  Disposition:  Discharge to Home .  Consultants: gastroenterology General surgery  Procedures: none  Scheduled Meds: . insulin aspart  0-9 Units Subcutaneous Q4H  . metoprolol tartrate  2.5 mg Intravenous Q6H   Continuous Infusions: . sodium chloride 100 mL/hr at 02/16/19 1723  . dextrose 5 % and 0.9% NaCl 50 mL/hr at 02/16/19 1722  . [START ON 02/17/2019] famotidine (PEPCID) IV    . piperacillin-tazobactam (ZOSYN)  IV 3.375 g (02/16/19 1709)   PRN Meds: albuterol, morphine injection, ondansetron (ZOFRAN) IV Antibiotics: Anti-infectives (From admission, onward)   Start     Dose/Rate Route Frequency Ordered Stop   02/15/19 0800  piperacillin-tazobactam (ZOSYN) IVPB 3.375 g     3.375 g 12.5 mL/hr over 240 Minutes Intravenous Every 8 hours 02/15/19 0116     02/14/19 2230  piperacillin-tazobactam (ZOSYN) IVPB 3.375 g     3.375 g 100 mL/hr over 30 Minutes Intravenous  Once 02/14/19 2223 02/14/19 2350  Objective: Physical Exam: Vitals:   02/16/19 0504 02/16/19 0900 02/16/19 1310 02/16/19 1705  BP: (!) 129/56  (!) 129/56 (!) 144/57   Pulse: 95  93 89  Resp: 17  16 (!) 76  Temp: (!) 101.3 F (38.5 C) 99.1 F (37.3 C) (!) 100.4 F (38 C) 98.3 F (36.8 C)  TempSrc: Oral Oral Oral Oral  SpO2: 94%  97% 99%  Weight:      Height:        Intake/Output Summary (Last 24 hours) at 02/16/2019 1746 Last data filed at 02/16/2019 1500 Gross per 24 hour  Intake 1414.34 ml  Output 400 ml  Net 1014.34 ml   Filed Weights   02/15/19 0105 02/15/19 0600  Weight: 58.1 kg 58.1 kg   General: alert and oriented to time, place, and person. Appear in mild distress, affect appropriate Eyes: PERRL, Conjunctiva normal ENT: Oral Mucosa Clear, moist  Neck: no JVD, no Abnormal Mass Or lumps Cardiovascular: S1 and S2 Present, no Murmur, peripheral pulses symmetrical Respiratory: normal respiratory effort, Bilateral Air entry equal and Decreased, no use of accessory muscle, Clear to Auscultation, no Crackles, no wheezes Abdomen: Bowel Sound present, Soft and mild tenderness, no hernia Skin: no rashes  Extremities: no Pedal edema, no calf tenderness Neurologic: normal without focal findings, mental status, speech normal, alert and oriented x3, PERLA, Motor strength 5/5 and symmetric and sensation grossly normal to light touch Gait not checked due to patient safety concerns  Data Reviewed: CBC: Recent Labs  Lab 02/14/19 2004 02/15/19 0416 02/16/19 0551  WBC 16.3* 8.3 8.1  HGB 12.7 11.8* 10.5*  HCT 41.8 37.8 34.7*  MCV 88.4 87.9 89.9  PLT 139* 110* 88*   Basic Metabolic Panel: Recent Labs  Lab 02/14/19 2004 02/15/19 0416 02/16/19 0551  NA 139 143 147*  K 3.5 3.2* 3.3*  CL 105 105 113*  CO2 25 27 28   GLUCOSE 227* 187* 104*  BUN 25* 25* 15  CREATININE 0.73 0.69 0.60  CALCIUM 8.4* 8.3* 7.7*    Liver Function Tests: Recent Labs  Lab 02/14/19 2004 02/15/19 0416 02/16/19 0551  AST 384* 406* 122*  ALT 276* 432* 238*  ALKPHOS 114 133* 114  BILITOT 1.2 1.0 1.5*  PROT 5.8* 5.5* 5.2*  ALBUMIN 3.4* 3.2* 2.8*   Recent  Labs  Lab 02/14/19 2004 02/15/19 0416 02/16/19 0551  LIPASE 3,121* 1,537* 97*   No results for input(s): AMMONIA in the last 168 hours. Coagulation Profile: Recent Labs  Lab 02/14/19 2004  INR 1.1   Cardiac Enzymes: Recent Labs  Lab 02/14/19 2004 02/15/19 0416 02/15/19 1123 02/15/19 1643  CKTOTAL  --  11*  --   --   CKMB  --  0.5  --   --   TROPONINI <0.03 <0.03 <0.03 <0.03   BNP (last 3 results) No results for input(s): PROBNP in the last 8760 hours. CBG: Recent Labs  Lab 02/16/19 0408 02/16/19 0728 02/16/19 1058 02/16/19 1303 02/16/19 1610  GLUCAP 90 90 67* 106* 123*   Studies: Dg Chest Port 1 View  Result Date: 02/16/2019 CLINICAL DATA:  Fever, hypertension, diabetes mellitus EXAM: PORTABLE CHEST 1 VIEW COMPARISON:  Portable exam 0748 hours compared to 02/15/2019 FINDINGS: Normal heart size, mediastinal contours, and pulmonary vascularity for technique. Bibasilar atelectasis. No definite acute infiltrate, pleural effusion or pneumothorax. BILATERAL glenohumeral degenerative changes and old posttraumatic deformities of the proximal humeri. IMPRESSION: Bibasilar atelectasis. Electronically Signed   By: Ulyses SouthwardMark  Boles M.D.   On: 02/16/2019 09:23  Time spent: 35 minutes  Author: Lynden OxfordPranav Illyana Schorsch, MD [facility] 02/16/2019 5:46 PM  To reach On-call, see care teams to locate the attending and reach out to them via [email or web address]. If 7PM-7AM, please contact night-coverage If you still have difficulty reaching the attending provider, please page the [facility] (Director on Call) for [facility] on amion for assistance.

## 2019-02-16 NOTE — Progress Notes (Addendum)
Xcover Fever 101.3 per RN  A/P Fever Check Blood culture x2 Check CXR    Acute pancreatitis Check Cbc, cmp lipase

## 2019-02-16 NOTE — Progress Notes (Addendum)
Subjective: Patient resting comfortably this morning.  Objective: Vital signs in last 24 hours: Temp:  [99.1 F (37.3 C)-101.3 F (38.5 C)] 99.1 F (37.3 C) (06/23 0900) Pulse Rate:  [70-96] 95 (06/23 0504) Resp:  [16-19] 17 (06/23 0504) BP: (114-136)/(49-93) 129/56 (06/23 0504) SpO2:  [92 %-97 %] 94 % (06/23 0504) Last BM Date: 02/14/19  Intake/Output from previous day: 06/22 0701 - 06/23 0700 In: 1796.8 [I.V.:1663.3; IV Piggyback:133.5] Out: 400 [Urine:400] Intake/Output this shift: No intake/output data recorded.  General appearance: no distress GI: soft, non-tender; bowel sounds normal; no masses,  no organomegaly  Lab Results:  Recent Labs    02/15/19 0416 02/16/19 0551  WBC 8.3 8.1  HGB 11.8* 10.5*  HCT 37.8 34.7*  PLT 110* 88*   BMET Recent Labs    02/15/19 0416 02/16/19 0551  NA 143 147*  K 3.2* 3.3*  CL 105 113*  CO2 27 28  GLUCOSE 187* 104*  BUN 25* 15  CREATININE 0.69 0.60  CALCIUM 8.3* 7.7*   PT/INR Recent Labs    02/14/19 2004  LABPROT 14.1  INR 1.1    Studies/Results: Ct Head Wo Contrast  Result Date: 02/15/2019 CLINICAL DATA:  Altered mental status. EXAM: CT HEAD WITHOUT CONTRAST TECHNIQUE: Contiguous axial images were obtained from the base of the skull through the vertex without intravenous contrast. COMPARISON:  12/06/2018. FINDINGS: Brain: No evidence for acute infarction, hemorrhage, mass lesion, hydrocephalus, or extra-axial fluid. Generalized atrophy. Hypoattenuation of white matter, likely small vessel disease. Vascular: Calcification of the cavernous internal carotid arteries and distal vertebral arteries, consistent with cerebrovascular atherosclerotic disease. No signs of intracranial large vessel occlusion. Skull: Calvarium intact. Sinuses/Orbits: Chronic RIGHT maxillary sinusitis. Negative orbits. Other: None. IMPRESSION: Atrophy and small vessel disease. No acute intracranial findings. Electronically Signed   By: Staci Righter M.D.   On: 02/15/2019 10:04   Ct Angio Chest Pe W Or Wo Contrast  Result Date: 02/15/2019 CLINICAL DATA:  Tachycardia, hypoxia EXAM: CT ANGIOGRAPHY CHEST WITH CONTRAST TECHNIQUE: Multidetector CT imaging of the chest was performed using the standard protocol during bolus administration of intravenous contrast. Multiplanar CT image reconstructions and MIPs were obtained to evaluate the vascular anatomy. CONTRAST:  54mL OMNIPAQUE IOHEXOL 350 MG/ML SOLN COMPARISON:  None. FINDINGS: Cardiovascular: Satisfactory opacification of the pulmonary arteries to the segmental level. No evidence of pulmonary embolism. Normal heart size. No pericardial effusion. Thoracic aortic atherosclerosis. Multi vessel coronary artery atherosclerosis. Mediastinum/Nodes: No enlarged mediastinal, hilar, or axillary lymph nodes. Thyroid gland, trachea, and esophagus demonstrate no significant findings. Lungs/Pleura: Bibasilar at airspace disease likely reflecting atelectasis. No pleural effusion or pneumothorax. No other focal parenchymal airspace disease. Upper Abdomen: No acute abnormality. Musculoskeletal: No acute osseous abnormality. No aggressive osseous lesion. T9 vertebral body sclerotic bone lesion most consistent with a bone island. Chronic L1 vertebral body compression fracture. Mild osteoarthritis of bilateral glenohumeral joints. Review of the MIP images confirms the above findings. IMPRESSION: 1. No evidence of pulmonary embolus. 2. Bibasilar atelectasis. 3.  Aortic Atherosclerosis (ICD10-I70.0). Electronically Signed   By: Kathreen Devoid   On: 02/15/2019 10:07   Ct Abdomen Pelvis W Contrast  Result Date: 02/14/2019 CLINICAL DATA:  Vomiting. Confusion. Upper abdominal tenderness. EXAM: CT ABDOMEN AND PELVIS WITH CONTRAST TECHNIQUE: Multidetector CT imaging of the abdomen and pelvis was performed using the standard protocol following bolus administration of intravenous contrast. CONTRAST:  144mL OMNIPAQUE IOHEXOL 300  MG/ML  SOLN COMPARISON:  None. FINDINGS: Lower chest: 6 mm perifissural nodule in the right  lower lobe. Bilateral lower lobe atelectasis, left greater than right. There are coronary artery calcifications. Mitral annulus calcifications. Hepatobiliary: No focal hepatic abnormality. Mild periportal edema. Distended gallbladder. Pericholecystic haziness with possible gallbladder wall thickening no calcified gallstone. Common bile duct measures 7 mm. Pancreas: Mild peripancreatic edema about the head versus motion artifact. No pancreatic ductal dilatation. Spleen: Elongated spanning 15 cm cranial caudal. 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 Questionable additional splenic artery aneurysm is inferiorly as well as adjacent to the larger lesion. No suspicious splenic lesion. Adrenals/Urinary Tract: Normal adrenal glands. No hydronephrosis or perinephric edema. Homogeneous renal enhancement with symmetric excretion on delayed phase imaging. Mild lobular renal contours bilaterally. Urinary bladder is partially distended, there is mild bladder wall thickening. Stomach/Bowel: Nondistended stomach. No small bowel obstruction or inflammation. Appendix not visualized, history of appendectomy. Diverticulosis in sigmoid colon without diverticulitis. Stool within the rectum with mild rectal wall thickening, small volume of colonic stool elsewhere. Vascular/Lymphatic: Aorto bi-iliac atherosclerosis. No aneurysm. No adenopathy. Reproductive: Uterus and bilateral adnexa are unremarkable. Other: No free air, free fluid, or intra-abdominal fluid collection. Musculoskeletal: Severe L1 compression fracture with posterior cortical buckling, chronic based on lumbar spine radiographs from 2019. Remote bilateral superior and inferior pubic rami fractures. Findings suspicious for remote right possibly left sacral insufficiency fractures. Bones are significantly under mineralized. Surgical fixation of  bilateral proximal femurs. IMPRESSION: 1. Distended gallbladder with pericholecystic haziness and possible gallbladder wall thickening. Findings suspicious for acute cholecystitis. Recommend further evaluation with right upper quadrant ultrasound. 2. Mild peripancreatic edema about the head of the pancreas versus motion artifact. Recommend correlation with pancreatic enzymes to exclude acute pancreatitis. 3. 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 Recommend annual cross-sectional imaging surveillance, with consideration given to patient's age and demographics. This recommendation follows ACR consensus guidelines: White Paper of the ACR Incidental Findings Committee II on Vascular Findings. J Am Coll Radiol 2013;10:789-794. 4. Mild urinary bladder wall thickening, recommend correlation with urinalysis to exclude urinary tract infection. 5. Sigmoid colonic diverticulosis without diverticulitis. 6. Right lower lobe 6 mm perifissural nodule. Non-contrast chest CT at 6-12 months is recommended. If the nodule is stable at time of repeat CT, then future CT at 18-24 months (from today's scan) is considered optional for low-risk patients, but is recommended for high-risk patients. This recommendation follows the consensus statement: Guidelines for Management of Incidental Pulmonary Nodules Detected on CT Images: From the Fleischner Society 2017; Radiology 2017; 284:228-243. 7.  Aortic Atherosclerosis (ICD10-I70.0). Electronically Signed   By: Narda RutherfordMelanie  Sanford M.D.   On: 02/14/2019 22:04   Dg Chest Port 1 View  Result Date: 02/16/2019 CLINICAL DATA:  Fever, hypertension, diabetes mellitus EXAM: PORTABLE CHEST 1 VIEW COMPARISON:  Portable exam 0748 hours compared to 02/15/2019 FINDINGS: Normal heart size, mediastinal contours, and pulmonary vascularity for technique. Bibasilar atelectasis. No definite acute infiltrate, pleural effusion or pneumothorax. BILATERAL glenohumeral degenerative changes and old  posttraumatic deformities of the proximal humeri. IMPRESSION: Bibasilar atelectasis. Electronically Signed   By: Ulyses SouthwardMark  Boles M.D.   On: 02/16/2019 09:23   Dg Chest Port 1 View  Result Date: 02/15/2019 CLINICAL DATA:  Hypoxia. EXAM: PORTABLE CHEST 1 VIEW COMPARISON:  Radiograph February 14, 2019. FINDINGS: The heart size and mediastinal contours are within normal limits. No pneumothorax is noted. Increased right basilar atelectasis or infiltrate is noted. Probable minimal bilateral pleural effusions are noted. The visualized skeletal structures are unremarkable. IMPRESSION: Increased right basilar subsegmental atelectasis or infiltrate. Probable minimal pleural effusions. Electronically  Signed   By: Lupita RaiderJames  Green Jr M.D.   On: 02/15/2019 07:14   Dg Chest Portable 1 View  Result Date: 02/14/2019 CLINICAL DATA:  Shortness of breath, emesis. EXAM: PORTABLE CHEST 1 VIEW COMPARISON:  12/05/2018 FINDINGS: Cardiomediastinal silhouette is normal. Mediastinal contours appear intact. Calcific atherosclerotic disease of the aorta. There is no evidence of focal airspace consolidation, pleural effusion or pneumothorax. Osseous structures are without acute abnormality. Soft tissues are grossly normal. IMPRESSION: No active disease. Electronically Signed   By: Ted Mcalpineobrinka  Dimitrova M.D.   On: 02/14/2019 20:34   [place] Abdomen Limited Ruq  Result Date: 02/15/2019 CLINICAL DATA:  Abnormal liver function test. EXAM: ULTRASOUND ABDOMEN LIMITED RIGHT UPPER QUADRANT COMPARISON:  CT scan of February 14, 2019. FINDINGS: Gallbladder: Multiple gallstones are noted with the largest measuring 1.7 cm. Sludge is noted. Gallbladder wall is significantly thickened at 8 mm. Mild pericholecystic fluid is noted. No sonographic Murphy's sign is noted. Common bile duct: Diameter: 4 mm which is within normal limits. Liver: No focal lesion identified. Within normal limits in parenchymal echogenicity. Portal vein is patent on color Doppler imaging with  normal direction of blood flow towards the liver. IMPRESSION: Cholelithiasis and sludge is noted within gallbladder lumen with significant gallbladder wall thickening and pericholecystic fluid concerning for acute cholecystitis. Clinical correlation is recommended. Electronically Signed   By: Lupita RaiderJames  Green Jr M.D.   On: 02/15/2019 09:19    Anti-infectives: Anti-infectives (From admission, onward)   Start     Dose/Rate Route Frequency Ordered Stop   02/15/19 0800  piperacillin-tazobactam (ZOSYN) IVPB 3.375 g     3.375 g 12.5 mL/hr over 240 Minutes Intravenous Every 8 hours 02/15/19 0116     02/14/19 2230  piperacillin-tazobactam (ZOSYN) IVPB 3.375 g     3.375 g 100 mL/hr over 30 Minutes Intravenous  Once 02/14/19 2223 02/14/19 2350      Assessment/Plan: Impression: Gallstone pancreatitis, resolving.  I suspect her fever may be related to the pancreatitis, though chest x-ray reveals bibasilar atelectasis.  Her white blood cell count is again normal. Plan: Patient is a high risk surgical candidate for general endotracheal intubation.  In reviewing her surgery in April of this year, she had a postoperative course complicated by mental status changes.  Though ideally one wants to treat her gallstone pancreatitis with cholecystectomy during this admission, I would like to see how she progresses as this appears to be her first episode.  Will start advancing diet to see how she does.  No need for MRCP at this time.  LOS: 2 days    Franky MachoMark Shawnee Higham 02/16/2019

## 2019-02-17 LAB — COMPREHENSIVE METABOLIC PANEL
ALT: 144 U/L — ABNORMAL HIGH (ref 0–44)
AST: 44 U/L — ABNORMAL HIGH (ref 15–41)
Albumin: 2.8 g/dL — ABNORMAL LOW (ref 3.5–5.0)
Alkaline Phosphatase: 116 U/L (ref 38–126)
Anion gap: 10 (ref 5–15)
BUN: 13 mg/dL (ref 8–23)
CO2: 23 mmol/L (ref 22–32)
Calcium: 7.7 mg/dL — ABNORMAL LOW (ref 8.9–10.3)
Chloride: 113 mmol/L — ABNORMAL HIGH (ref 98–111)
Creatinine, Ser: 0.53 mg/dL (ref 0.44–1.00)
GFR calc Af Amer: >60 mL/min (ref >60)
GFR calc non Af Amer: >60 mL/min (ref >60)
Glucose, Bld: 126 mg/dL — ABNORMAL HIGH (ref 70–99)
Potassium: 2.5 mmol/L — CL (ref 3.5–5.1)
Sodium: 146 mmol/L — ABNORMAL HIGH (ref 135–145)
Total Bilirubin: 1.8 mg/dL — ABNORMAL HIGH (ref 0.3–1.2)
Total Protein: 5.5 g/dL — ABNORMAL LOW (ref 6.5–8.1)

## 2019-02-17 LAB — GLUCOSE, CAPILLARY
Glucose-Capillary: 104 mg/dL — ABNORMAL HIGH (ref 70–99)
Glucose-Capillary: 123 mg/dL — ABNORMAL HIGH (ref 70–99)
Glucose-Capillary: 134 mg/dL — ABNORMAL HIGH (ref 70–99)
Glucose-Capillary: 186 mg/dL — ABNORMAL HIGH (ref 70–99)
Glucose-Capillary: 99 mg/dL (ref 70–99)

## 2019-02-17 LAB — CBC WITH DIFFERENTIAL/PLATELET
Abs Immature Granulocytes: 0.14 10*3/uL — ABNORMAL HIGH (ref 0.00–0.07)
Basophils Absolute: 0 10*3/uL (ref 0.0–0.1)
Basophils Relative: 0 %
Eosinophils Absolute: 0 10*3/uL (ref 0.0–0.5)
Eosinophils Relative: 0 %
HCT: 39.3 % (ref 36.0–46.0)
Hemoglobin: 11.3 g/dL — ABNORMAL LOW (ref 12.0–15.0)
Immature Granulocytes: 1 %
Lymphocytes Relative: 5 %
Lymphs Abs: 0.7 10*3/uL (ref 0.7–4.0)
MCH: 26.8 pg (ref 26.0–34.0)
MCHC: 28.8 g/dL — ABNORMAL LOW (ref 30.0–36.0)
MCV: 93.3 fL (ref 80.0–100.0)
Monocytes Absolute: 0.8 10*3/uL (ref 0.1–1.0)
Monocytes Relative: 6 %
Neutro Abs: 11.2 10*3/uL — ABNORMAL HIGH (ref 1.7–7.7)
Neutrophils Relative %: 88 %
Platelets: 105 10*3/uL — ABNORMAL LOW (ref 150–400)
RBC: 4.21 MIL/uL (ref 3.87–5.11)
RDW: 15.4 % (ref 11.5–15.5)
WBC: 12.9 10*3/uL — ABNORMAL HIGH (ref 4.0–10.5)
nRBC: 0 % (ref 0.0–0.2)

## 2019-02-17 LAB — MAGNESIUM: Magnesium: 1.7 mg/dL (ref 1.7–2.4)

## 2019-02-17 MED ORDER — LORAZEPAM 2 MG/ML IJ SOLN
1.0000 mg | Freq: Once | INTRAMUSCULAR | Status: AC
  Administered 2019-02-17: 1 mg via INTRAVENOUS
  Filled 2019-02-17: qty 1

## 2019-02-17 MED ORDER — MORPHINE SULFATE (PF) 2 MG/ML IV SOLN
1.0000 mg | Freq: Once | INTRAVENOUS | Status: AC
  Administered 2019-02-17: 1 mg via INTRAVENOUS

## 2019-02-17 MED ORDER — METOPROLOL TARTRATE 5 MG/5ML IV SOLN
5.0000 mg | Freq: Once | INTRAVENOUS | Status: AC
  Administered 2019-02-18: 5 mg via INTRAVENOUS
  Filled 2019-02-17: qty 5

## 2019-02-17 MED ORDER — LORAZEPAM 2 MG/ML IJ SOLN
1.0000 mg | Freq: Once | INTRAMUSCULAR | Status: AC
  Administered 2019-02-17: 04:00:00 1 mg via INTRAVENOUS
  Filled 2019-02-17: qty 1

## 2019-02-17 MED ORDER — POTASSIUM CHLORIDE 10 MEQ/100ML IV SOLN
10.0000 meq | INTRAVENOUS | Status: AC
  Administered 2019-02-17 (×4): 10 meq via INTRAVENOUS
  Filled 2019-02-17 (×4): qty 100

## 2019-02-17 MED ORDER — MAGNESIUM SULFATE 2 GM/50ML IV SOLN
2.0000 g | Freq: Once | INTRAVENOUS | Status: AC
  Administered 2019-02-17: 08:00:00 2 g via INTRAVENOUS
  Filled 2019-02-17: qty 50

## 2019-02-17 MED ORDER — KCL IN DEXTROSE-NACL 40-5-0.45 MEQ/L-%-% IV SOLN
INTRAVENOUS | Status: DC
  Administered 2019-02-17 – 2019-02-19 (×4): via INTRAVENOUS
  Filled 2019-02-17: qty 1000

## 2019-02-17 MED ORDER — PANTOPRAZOLE SODIUM 40 MG IV SOLR
40.0000 mg | INTRAVENOUS | Status: DC
  Administered 2019-02-17 – 2019-02-19 (×3): 40 mg via INTRAVENOUS
  Filled 2019-02-17 (×3): qty 40

## 2019-02-17 NOTE — Progress Notes (Signed)
Subjective: Dementia and resting calmly when I enter, with awakening and asking questions she began moaning; Not answering questions today. Seems to cringe/'moan to indicate pain when her abdomen is palpated, however she did the same when touching her arms or legs. No answering questions today.  Objective: Vital signs in last 24 hours: Temp:  [97.7 F (36.5 C)-100.4 F (38 C)] 97.7 F (36.5 C) (06/24 0636) Pulse Rate:  [81-129] 129 (06/24 0636) Resp:  [16-18] 18 (06/24 0636) BP: (125-155)/(54-63) 155/63 (06/24 0636) SpO2:  [92 %-99 %] 92 % (06/24 0636) Last BM Date: 02/14/19 General:   Confused, not answering questions. Moaning in the bed when I enter but was resting quietly when holding her hand. Head:  Normocephalic and atraumatic. Eyes:  No icterus, sclera clear. Conjuctiva pink.  Heart:  S1, S2 present, no murmurs noted.  Lungs: Difficult to auscultate due to moaning. Audibly wheezing; on oxygen per nasal cannula.  Abdomen:  Bowel sounds present, soft, non-tender, non-distended. No HSM or hernias noted. No rebound or guarding. No masses appreciated  Msk:  Symmetrical without gross deformities. Pulses:  Normal bilateral pulses noted. Extremities:  Without clubbing or edema. Neurologic:  Significant confusion consistent with dementia. Psych:  Confused, not verbally responsive to questions, moaning.  Intake/Output from previous day: 06/23 0701 - 06/24 0700 In: 418.2 [I.V.:418.2] Out: -  Intake/Output this shift: No intake/output data recorded.  Lab Results: Recent Labs    02/15/19 0416 02/16/19 0551 02/17/19 0619  WBC 8.3 8.1 12.9*  HGB 11.8* 10.5* 11.3*  HCT 37.8 34.7* 39.3  PLT 110* 88* 105*   BMET Recent Labs    02/15/19 0416 02/16/19 0551 02/17/19 0619  NA 143 147* 146*  K 3.2* 3.3* 2.5*  CL 105 113* 113*  CO2 27 28 23   GLUCOSE 187* 104* 126*  BUN 25* 15 13  CREATININE 0.69 0.60 0.53  CALCIUM 8.3* 7.7* 7.7*   LFT Recent Labs    02/15/19 0416  02/16/19 0551 02/17/19 0619  PROT 5.5* 5.2* 5.5*  ALBUMIN 3.2* 2.8* 2.8*  AST 406* 122* 44*  ALT 432* 238* 144*  ALKPHOS 133* 114 116  BILITOT 1.0 1.5* 1.8*   PT/INR Recent Labs    02/14/19 2004  LABPROT 14.1  INR 1.1   Hepatitis Panel Recent Labs    02/15/19 0416  HEPBSAG Negative  HCVAB <0.1  HEPAIGM Negative  HEPBIGM Negative     Studies/Results: Dg Chest Port 1 View  Result Date: 02/16/2019 CLINICAL DATA:  Fever, hypertension, diabetes mellitus EXAM: PORTABLE CHEST 1 VIEW COMPARISON:  Portable exam 0748 hours compared to 02/15/2019 FINDINGS: Normal heart size, mediastinal contours, and pulmonary vascularity for technique. Bibasilar atelectasis. No definite acute infiltrate, pleural effusion or pneumothorax. BILATERAL glenohumeral degenerative changes and old posttraumatic deformities of the proximal humeri. IMPRESSION: Bibasilar atelectasis. Electronically Signed   By: Ulyses SouthwardMark  Boles M.D.   On: 02/16/2019 09:23    Assessment: Acute Gallstone Pancreatitis: Suspect biliary etiology. US with confirmed cholelithiasis and likely acute cholecystitis; noted sludge. US and CT without CBD dilation. Pancreatitis improving. LFTs continue to improve: AST/ALT at 44/144, alkaline phosphatase normal at 116, bilirubin stable/slightly elevated at 1.8 (likely bilirubin lag after transaminases as is typical).  Lipase yesterday significantly improved at 97 from 1537 two days prior.  The patient did have mild fever yesterday with darkened urine and urinalysis pending although she was already on Zosyn.  WBC normal limits yesterday but increased to 12.9 today.  Creatinine normal/stable today.  There was an  attempt to advance her diet to liquid yesterday and query possible aspiration pneumonia.  So she was made n.p.o. until speech language pathology could evaluate her.  Chest x-ray yesterday with bibasilar atelectasis.  Surgery has evaluated the patient and deemed she is not a surgical candidate at  this time.  Today she seems to moan in pain with abdominal palpation, but she responds similarly when extremities are palpated as well; difficult to discern if she is truly having abdominal pain. She is audibly wheezing and on oxygen.  Overall likely resolving gallstone pancreatitis with improvement in LFTs. Not a surgical candidate. Possible aspiration pneumonia, SLP evaluation pending.  Plan: 1. Continue n.p.o. until SLP evaluation; further recommendations per hospitalist 2. Continue IV fluids 3. Trend LFTs to normal 4. Supportive measures 5. We will follow peripherally   Thank you for allowing [place] to participate in the care of Avian Anglemyer  Walden Field, DNP, AGNP-C Adult & Gerontological Nurse Practitioner [facility]     LOS: 3 days    02/17/2019, 10:45 AM

## 2019-02-17 NOTE — Progress Notes (Signed)
CRITICAL VALUE ALERT  Critical Value:  Potassium 2.5  Date & Time Notied:  02/17/2019 0740  Provider Notified: Dr. Denton Brick  Orders Received/Actions taken: no orders given at this time

## 2019-02-17 NOTE — Progress Notes (Signed)
Pt noted removing IVs, attempting to get out of bed and yelling out despite verbal redirections, MD notified.

## 2019-02-17 NOTE — Progress Notes (Signed)
Subjective: Patient was noted to have difficulty swallowing clear liquids.  She is back to n.p.o.  There is a question of whether she is aspirating.  She has audible expiratory wheezing.  Objective: Vital signs in last 24 hours: Temp:  [97.7 F (36.5 C)-100.4 F (38 C)] 97.7 F (36.5 C) (06/24 0636) Pulse Rate:  [81-129] 129 (06/24 0636) Resp:  [16-18] 18 (06/24 0636) BP: (125-155)/(54-63) 155/63 (06/24 0636) SpO2:  [92 %-99 %] 92 % (06/24 0636) Last BM Date: 02/14/19  Intake/Output from previous day: 06/23 0701 - 06/24 0700 In: 418.2 [I.V.:418.2] Out: -  Intake/Output this shift: No intake/output data recorded.  General appearance: appears stated age and slowed mentation Resp: Bilateral expiratory wheezing noted.  Seems somewhat tachypneic. GI: soft, non-tender; bowel sounds normal; no masses,  no organomegaly  Lab Results:  Recent Labs    02/16/19 0551 02/17/19 0619  WBC 8.1 12.9*  HGB 10.5* 11.3*  HCT 34.7* 39.3  PLT 88* 105*   BMET Recent Labs    02/16/19 0551 02/17/19 0619  NA 147* 146*  K 3.3* 2.5*  CL 113* 113*  CO2 28 23  GLUCOSE 104* 126*  BUN 15 13  CREATININE 0.60 0.53  CALCIUM 7.7* 7.7*   PT/INR Recent Labs    02/14/19 2004  LABPROT 14.1  INR 1.1    Studies/Results: Ct Head Wo Contrast  Result Date: 02/15/2019 CLINICAL DATA:  Altered mental status. EXAM: CT HEAD WITHOUT CONTRAST TECHNIQUE: Contiguous axial images were obtained from the base of the skull through the vertex without intravenous contrast. COMPARISON:  12/06/2018. FINDINGS: Brain: No evidence for acute infarction, hemorrhage, mass lesion, hydrocephalus, or extra-axial fluid. Generalized atrophy. Hypoattenuation of white matter, likely small vessel disease. Vascular: Calcification of the cavernous internal carotid arteries and distal vertebral arteries, consistent with cerebrovascular atherosclerotic disease. No signs of intracranial large vessel occlusion. Skull: Calvarium  intact. Sinuses/Orbits: Chronic RIGHT maxillary sinusitis. Negative orbits. Other: None. IMPRESSION: Atrophy and small vessel disease. No acute intracranial findings. Electronically Signed   By: Staci Righter M.D.   On: 02/15/2019 10:04   Ct Angio Chest Pe W Or Wo Contrast  Result Date: 02/15/2019 CLINICAL DATA:  Tachycardia, hypoxia EXAM: CT ANGIOGRAPHY CHEST WITH CONTRAST TECHNIQUE: Multidetector CT imaging of the chest was performed using the standard protocol during bolus administration of intravenous contrast. Multiplanar CT image reconstructions and MIPs were obtained to evaluate the vascular anatomy. CONTRAST:  65mL OMNIPAQUE IOHEXOL 350 MG/ML SOLN COMPARISON:  None. FINDINGS: Cardiovascular: Satisfactory opacification of the pulmonary arteries to the segmental level. No evidence of pulmonary embolism. Normal heart size. No pericardial effusion. Thoracic aortic atherosclerosis. Multi vessel coronary artery atherosclerosis. Mediastinum/Nodes: No enlarged mediastinal, hilar, or axillary lymph nodes. Thyroid gland, trachea, and esophagus demonstrate no significant findings. Lungs/Pleura: Bibasilar at airspace disease likely reflecting atelectasis. No pleural effusion or pneumothorax. No other focal parenchymal airspace disease. Upper Abdomen: No acute abnormality. Musculoskeletal: No acute osseous abnormality. No aggressive osseous lesion. T9 vertebral body sclerotic bone lesion most consistent with a bone island. Chronic L1 vertebral body compression fracture. Mild osteoarthritis of bilateral glenohumeral joints. Review of the MIP images confirms the above findings. IMPRESSION: 1. No evidence of pulmonary embolus. 2. Bibasilar atelectasis. 3.  Aortic Atherosclerosis (ICD10-I70.0). Electronically Signed   By: Kathreen Devoid   On: 02/15/2019 10:07   Dg Chest Port 1 View  Result Date: 02/16/2019 CLINICAL DATA:  Fever, hypertension, diabetes mellitus EXAM: PORTABLE CHEST 1 VIEW COMPARISON:  Portable exam 0748  hours compared to 02/15/2019  FINDINGS: Normal heart size, mediastinal contours, and pulmonary vascularity for technique. Bibasilar atelectasis. No definite acute infiltrate, pleural effusion or pneumothorax. BILATERAL glenohumeral degenerative changes and old posttraumatic deformities of the proximal humeri. IMPRESSION: Bibasilar atelectasis. Electronically Signed   By: Ulyses SouthwardMark  Boles M.D.   On: 02/16/2019 09:23   [place] Abdomen Limited Ruq  Result Date: 02/15/2019 CLINICAL DATA:  Abnormal liver function test. EXAM: ULTRASOUND ABDOMEN LIMITED RIGHT UPPER QUADRANT COMPARISON:  CT scan of February 14, 2019. FINDINGS: Gallbladder: Multiple gallstones are noted with the largest measuring 1.7 cm. Sludge is noted. Gallbladder wall is significantly thickened at 8 mm. Mild pericholecystic fluid is noted. No sonographic Murphy's sign is noted. Common bile duct: Diameter: 4 mm which is within normal limits. Liver: No focal lesion identified. Within normal limits in parenchymal echogenicity. Portal vein is patent on color Doppler imaging with normal direction of blood flow towards the liver. IMPRESSION: Cholelithiasis and sludge is noted within gallbladder lumen with significant gallbladder wall thickening and pericholecystic fluid concerning for acute cholecystitis. Clinical correlation is recommended. Electronically Signed   By: Lupita RaiderJames  Green Jr M.D.   On: 02/15/2019 09:19    Anti-infectives: Anti-infectives (From admission, onward)   Start     Dose/Rate Route Frequency Ordered Stop   02/15/19 0800  piperacillin-tazobactam (ZOSYN) IVPB 3.375 g     3.375 g 12.5 mL/hr over 240 Minutes Intravenous Every 8 hours 02/15/19 0116     02/14/19 2230  piperacillin-tazobactam (ZOSYN) IVPB 3.375 g     3.375 g 100 mL/hr over 30 Minutes Intravenous  Once 02/14/19 2223 02/14/19 2350      Assessment/Plan: Impression: Resolving gallstone pancreatitis.  Hypokalemia.  LFTs are normalizing.  She does not have clinical evidence of  pancreatitis.  I am concerned that she may have aspirated when starting clear liquid diet.  Work-up is pending.  Will continue conservative treatment at this point.  Patient is not a surgical candidate at this time.  Dr. Marisa Severinourage is aware.  LOS: 3 days    Franky MachoMark Ndrew Creason 02/17/2019

## 2019-02-17 NOTE — Care Management Important Message (Signed)
Important Message  Patient Details  Name: Kristin Grant MRN: [id number] Date of Birth: 1941-08-24   Medicare Important Message Given:  Yes     Tommy Medal 02/17/2019, 1:29 PM

## 2019-02-17 NOTE — Evaluation (Signed)
Clinical/Bedside Swallow Evaluation Patient Details  Name: Kristin Grant MRN: [id number] Date of Birth: 1941-06-30  Today's Date: 02/17/2019 Time: SLP Start Time (ACUTE ONLY): 1120 SLP Stop Time (ACUTE ONLY): 1151 SLP Time Calculation (min) (ACUTE ONLY): 31 min  Past Medical History:  Past Medical History:  Diagnosis Date  . Alzheimer disease (HCC)   . Arthritis   . Coronary artery disease   . Diabetes mellitus   . Difficulty walking   . Hypertension   . Reflux   . Stroke [facility](HCC)    Past Surgical History:  Past Surgical History:  Procedure Laterality Date  . APPENDECTOMY    . FINGER DEBRIDEMENT    . INTRAMEDULLARY (IM) NAIL INTERTROCHANTERIC Right 08/03/2018   Procedure: INTRAMEDULLARY (IM) NAIL INTERTROCHANTRIC;  Surgeon: Eldred MangesYates, Mark C, MD;  Location: [facility] OR;  Service: Orthopedics;  Laterality: Right;  . INTRAMEDULLARY (IM) NAIL INTERTROCHANTERIC Left 12/04/2018   Procedure: INTRAMEDULLARY (IM) NAIL INTERTROCHANTRIC;  Surgeon: Eldred MangesYates, Mark C, MD;  Location: [facility] OR;  Service: Orthopedics;  Laterality: Left;   HPI:  Pt. 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 BSE requested as Pt noted to cough with clear liquids yesterday. [facility]   Assessment / Plan / Recommendation Clinical Impression  Patient presents with increased risk for aspiration in the setting of altered mentation, compromised respiratory status, and lethargy. Pt without gross facial asymmetry/cranial nerve deficits. Oral care completed due to significant xerostomia. Pt presents with signs of reduced airway protection at this time with delay in swallow initiation and delayed strong coughing and wheezing following thin liquid trials. Recommend NPO; allow ice chips after oral care if pt alert  and cooperative. If oral medications are necessary, present crushed in puree as able. SLP will follow.   SLP Visit Diagnosis: Dysphagia, oropharyngeal phase (R13.12)    Aspiration Risk  Moderate aspiration risk;Risk for inadequate nutrition/hydration    Diet Recommendation NPO;NPO except meds;Ice chips PRN after oral care   Medication Administration: Crushed with puree Supervision: Staff to assist with self feeding;Full supervision/cueing for compensatory strategies Compensations: Small sips/bites Postural Changes: Seated upright at 90 degrees;Remain upright for at least 30 minutes after po intake    Other  Recommendations Oral Care Recommendations: Oral care prior to ice chip/H20;Staff/trained caregiver to provide oral care   Follow up Recommendations 24 hour supervision/assistance;Skilled Nursing facility      Frequency and Duration min 2x/week  1 week       Prognosis Prognosis for Safe Diet Advancement: Guarded Barriers to Reach Goals: Cognitive deficits;Severity of deficits      Swallow Study   General Date of Onset: 02/14/19 HPI: Pt. 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 BSE requested as Pt noted to cough with clear liquids yesterday. [facility]. Type of Study: Bedside Swallow Evaluation [facility] on D3/thin Diet Prior to this Study: NPO Temperature Spikes Noted: No Respiratory Status: Nasal cannula History of Recent Intubation: No Behavior/Cognition: Alert;Cooperative;Requires cueing Oral Cavity Assessment: Dry;Dried secretions Oral Care Completed by SLP: Yes Oral Cavity - Dentition: Edentulous Vision: Functional  for self-feeding Self-Feeding Abilities: Total assist Patient  Positioning: Upright in bed Baseline Vocal Quality: Normal;Low vocal intensity Volitional Cough: Weak Volitional Swallow: Unable to elicit    Oral/Motor/Sensory Function Overall Oral Motor/Sensory Function: Generalized oral weakness   Ice Chips Ice chips: Impaired Presentation: Spoon Oral Phase Impairments: Reduced lingual movement/coordination Oral Phase Functional Implications: Prolonged oral transit Pharyngeal Phase Impairments: Suspected delayed Swallow;Unable to trigger swallow   Thin Liquid Thin Liquid: Impaired Presentation: Cup;Spoon Oral Phase Impairments: Reduced labial seal;Reduced lingual movement/coordination Pharyngeal  Phase Impairments: Suspected delayed Swallow;Cough - Delayed    Nectar Thick Nectar Thick Liquid: Not tested   Honey Thick Honey Thick Liquid: Not tested   Puree Puree: Impaired Presentation: Spoon Oral Phase Impairments: Reduced lingual movement/coordination Oral Phase Functional Implications: Prolonged oral transit;Oral residue   Solid     Solid: Not tested     Thank you,  Genene Churn, [place]  PORTER,DABNEY 02/17/2019,12:01 PM

## 2019-02-17 NOTE — Progress Notes (Signed)
Patient Demographics:    Kristin Grant, is a 78 y.o. female, DOB - June 28, 1941, ZOX:[id number]  Admit date - 02/14/2019   Admitting Physician Pearson GrippeJames Kim, MD  Outpatient Primary MD for the patient is [place], Velna HatchetKawanta F, MD  LOS - 3   Chief Complaint  Patient presents with   Emesis        Subjective:    Kristin Grant today has no fevers, no emesis,  No chest pain,     Assessment  & Plan :    Principal Problem:   Acute pancreatitis Active Problems:   Hypertension   Hypothyroidism   Diabetes mellitus, type II (HCC)   Abnormal liver function  Brief Summary Pt. 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 Currently further plan is continue conservative measures.  A/p  Acute cholecystitis  Tmax 101.3 , T current 98.3 Discussed with general surgeon Dr. Lovell SheehanJenkins advised conservative management, patient is a very high surgical risk patient  ----continue morphine for pain control, and Zofran for nausea vomiting, IV Zosyn as ordered Discussed with GI service, LFTs trending down no GI intervention planned   Abnormal liver function RUQ ultrasound shows no evidence of CBD obstruction. acute hepatitis panel pending STOP Mirtazapine as this can affect liver function  Type 2 diabetes mellitus, Uncontrolled with hyperglycemia.  No complication. STOP Tradjenta => associated with pancreatitis STOP Lantus, Novolog fsbs q4h, ISS Currently on D5 until oral intake is reliable  Hpokalemia/Hypomagnesemia/HypoNatremia--replace and recheck-- change IVF to d5 1/2 NS with KCL  H/o CVA Cont aspirin HOLD Plavix for now,   Dementia (baseline ambulates with walker) Acute metabolic encephalopathy with  delirium STOP mirtazepine as above Close monitoring for now.  Hypothyroidism Cont Levothyroxine 50 micrograms po qday  Hypertension STOP Lisinopril STOP Propranolol Metoprolol 2.5mg  iv q6h (hold sbp <100, hr <60  Moderate protein calorie malnutrition Please address after pancreatitis resolves  Severe compression fracture L1 Please obtain bone density test as outpatient Will need calcium and vitamin D and antiresorptive agent Would avoid Bisphosphonate due to hx of GERD, probably would benefit from Prolia 60mg  [place] q6 months   Right Lower Lung 6mm nodule on CT scan Please repeat CT chest noncontrast in 6-12 months  Splenic artery aneurysm Please curbside vascular regarding whether needs follow up in AM  Dysphagia. Speech therapy consulted.  Currently n.p.o.  Diet: NPO  DVT Prophylaxis: SCD, pharmacological prophylaxis contraindicated due to Pending procedure  Advance goals of care discussion: Full code  Family Communication: no family was present at bedside, at the time of interview.  Discussed with family on 02/15/2019 as well as 02/16/2019.  Disposition:  Discharge to Home .  Consultants: gastroenterology General surgery   Disposition/Need for in-Hospital Stay- patient unable to be discharged at this time due to IV antibiotics for acute cholecystitis also needs replacement of electrolyte imbalances IV and IV fluids due to lack of oral intake  Code Status : Full     DVT Prophylaxis  :   - SCDs   Lab Results  Component Value Date   PLT 105 (L) 02/17/2019  Inpatient Medications  Scheduled Meds:  insulin aspart  0-9 Units Subcutaneous Q4H   metoprolol tartrate  2.5 mg Intravenous Q6H   Continuous Infusions:  sodium chloride 100 mL/hr at 02/17/19 0423   famotidine (PEPCID) IV     magnesium sulfate bolus IVPB     piperacillin-tazobactam (ZOSYN)  IV 3.375 g (02/17/19 0003)   potassium chloride     PRN Meds:.albuterol, morphine injection,  ondansetron (ZOFRAN) IV    Anti-infectives (From admission, onward)   Start     Dose/Rate Route Frequency Ordered Stop   02/15/19 0800  piperacillin-tazobactam (ZOSYN) IVPB 3.375 g     3.375 g 12.5 mL/hr over 240 Minutes Intravenous Every 8 hours 02/15/19 0116     02/14/19 2230  piperacillin-tazobactam (ZOSYN) IVPB 3.375 g     3.375 g 100 mL/hr over 30 Minutes Intravenous  Once 02/14/19 2223 02/14/19 2350        Objective:   Vitals:   02/16/19 1310 02/16/19 1705 02/16/19 2058 02/17/19 0636  BP: (!) 129/56 (!) 144/57 (!) 125/54 (!) 155/63  Pulse: 93 89 81 (!) 129  Resp: Temp: (!) 100.4 F (38 C) 98.3 F (36.8 C) 98.5 F (36.9 C) 97.7 F (36.5 C)  TempSrc: Oral Oral Oral Oral  SpO2: 97% 99% 96% 92%  Weight:      Height:        Wt Readings from Last 3 Encounters:  02/15/19 58.1 kg  01/22/19 59.9 kg  12/24/18 61.7 kg     Intake/Output Summary (Last 24 hours) at 02/17/2019 0756 Last data filed at 02/16/2019 1500 Gross per 24 hour  Intake 418.17 ml  Output --  Net 418.17 ml     Physical Exam  Gen:- Awake Alert,  In no apparent distress  HEENT:- [place].AT, No sclera icterus Neck-Supple Neck,No JVD,.  Lungs-  CTAB , fair symmetrical air movement CV- S1, S2 normal, regular  Abd-  +ve B.Sounds, Abd Soft, periumbilical and right upper quadrant tenderness,    Extremity/Skin:- No  edema, pedal pulses present  Psych-cognitive deficits neuro-generalized weakness , no new focal deficits, no tremors   Data Review:   Micro Results Recent Results (from the past 240 hour(s))  SARS Coronavirus 2 (CEPHEID - Performed in [facility] hospital lab), Hosp Order     Status: None   Collection Time: 02/14/19 10:20 PM   Specimen: Nasopharyngeal Swab  Result Value Ref Range Status   SARS Coronavirus 2 NEGATIVE NEGATIVE Final    Comment: (NOTE) If result is NEGATIVE SARS-CoV-2 target nucleic acids are NOT DETECTED. The SARS-CoV-2 RNA is generally detectable in upper and  lower  respiratory specimens during the acute phase of infection. The lowest  concentration of SARS-CoV-2 viral copies this assay can detect is 250  copies / mL. A negative result does not preclude SARS-CoV-2 infection  and should not be used as the sole basis for treatment or other  patient management decisions.  A negative result may occur with  improper specimen collection / handling, submission of specimen other  than nasopharyngeal swab, presence of viral mutation(s) within the  areas targeted by this assay, and inadequate number of viral copies  (<250 copies / mL). A negative result must be combined with clinical  observations, patient history, and epidemiological information. If result is POSITIVE SARS-CoV-2 target nucleic acids are DETECTED. The SARS-CoV-2 RNA is generally detectable in upper and lower  respiratory specimens dur ing the acute phase of infection.  Positive  results are  indicative of active infection with SARS-CoV-2.  Clinical  correlation with patient history and other diagnostic information is  necessary to determine patient infection status.  Positive results do  not rule out bacterial infection or co-infection with other viruses. If result is PRESUMPTIVE POSTIVE SARS-CoV-2 nucleic acids MAY BE PRESENT.   A presumptive positive result was obtained on the submitted specimen  and confirmed on repeat testing.  While 2019 novel coronavirus  (SARS-CoV-2) nucleic acids may be present in the submitted sample  additional confirmatory testing may be necessary for epidemiological  and / or clinical management purposes  to differentiate between  SARS-CoV-2 and other Sarbecovirus currently known to infect humans.  If clinically indicated additional testing with an alternate test  methodology [phone]) is advised. The SARS-CoV-2 RNA is generally  detectable in upper and lower respiratory sp ecimens during the acute  phase of infection. The expected result is  Negative. Fact Sheet for Patients:  StrictlyIdeas.no Fact Sheet for Healthcare Providers: BankingDealers.co.za This test is not yet approved or cleared by the [place] FDA and has been authorized for detection and/or diagnosis of SARS-CoV-2 by FDA under an Emergency Use Authorization (EUA).  This EUA will remain in effect (meaning this test can be used) for the duration of the COVID-19 declaration under Section 564(b)(1) of the Act, 21 U.S.C. section 360bbb-3(b)(1), unless the authorization is terminated or revoked sooner. Performed at [facility], [address] [place], [place] [address]   Culture, blood (Routine X 2) w Reflex to ID Panel     Status: None (Preliminary result)   Collection Time: 02/16/19  5:51 AM   Specimen: BLOOD RIGHT ARM  Result Value Ref Range Status   Specimen Description BLOOD RIGHT ARM  Final   Special Requests   Final    BOTTLES DRAWN AEROBIC AND ANAEROBIC Blood Culture results may not be optimal due to an excessive volume of blood received in culture bottles   Culture   Final    NO GROWTH 1 DAY Performed at [facility], [address], [place], [place] [address]    Report Status PENDING  Incomplete  Culture, blood (Routine X 2) w Reflex to ID Panel     Status: None (Preliminary result)   Collection Time: 02/16/19  5:53 AM   Specimen: BLOOD LEFT HAND  Result Value Ref Range Status   Specimen Description BLOOD LEFT HAND  Final   Special Requests   Final    BOTTLES DRAWN AEROBIC AND ANAEROBIC Blood Culture adequate volume   Culture   Final    NO GROWTH 1 DAY Performed at [facility], [address] [place], [place] [address]    Report Status PENDING  Incomplete    Radiology Reports Ct Head Wo Contrast  Result Date: 02/15/2019 CLINICAL DATA:  Altered mental status. EXAM: CT HEAD WITHOUT CONTRAST TECHNIQUE: Contiguous axial images were obtained from the base of the skull through the vertex without  intravenous contrast. COMPARISON:  12/06/2018. FINDINGS: Brain: No evidence for acute infarction, hemorrhage, mass lesion, hydrocephalus, or extra-axial fluid. Generalized atrophy. Hypoattenuation of white matter, likely small vessel disease. Vascular: Calcification of the cavernous internal carotid arteries and distal vertebral arteries, consistent with cerebrovascular atherosclerotic disease. No signs of intracranial large vessel occlusion. Skull: Calvarium intact. Sinuses/Orbits: Chronic RIGHT maxillary sinusitis. Negative orbits. Other: None. IMPRESSION: Atrophy and small vessel disease. No acute intracranial findings. Electronically Signed   By: Staci Righter M.D.   On: 02/15/2019 10:04   Ct Angio Chest Pe W Or  Wo Contrast  Result Date: 02/15/2019 CLINICAL DATA:  Tachycardia, hypoxia EXAM: CT ANGIOGRAPHY CHEST WITH CONTRAST TECHNIQUE: Multidetector CT imaging of the chest was performed using the standard protocol during bolus administration of intravenous contrast. Multiplanar CT image reconstructions and MIPs were obtained to evaluate the vascular anatomy. CONTRAST:  75mL OMNIPAQUE IOHEXOL 350 MG/ML SOLN COMPARISON:  None. FINDINGS: Cardiovascular: Satisfactory opacification of the pulmonary arteries to the segmental level. No evidence of pulmonary embolism. Normal heart size. No pericardial effusion. Thoracic aortic atherosclerosis. Multi vessel coronary artery atherosclerosis. Mediastinum/Nodes: No enlarged mediastinal, hilar, or axillary lymph nodes. Thyroid gland, trachea, and esophagus demonstrate no significant findings. Lungs/Pleura: Bibasilar at airspace disease likely reflecting atelectasis. No pleural effusion or pneumothorax. No other focal parenchymal airspace disease. Upper Abdomen: No acute abnormality. Musculoskeletal: No acute osseous abnormality. No aggressive osseous lesion. T9 vertebral body sclerotic bone lesion most consistent with a bone island. Chronic L1 vertebral body compression  fracture. Mild osteoarthritis of bilateral glenohumeral joints. Review of the MIP images confirms the above findings. IMPRESSION: 1. No evidence of pulmonary embolus. 2. Bibasilar atelectasis. 3.  Aortic Atherosclerosis (ICD10-I70.0). Electronically Signed   By: Elige KoHetal  Patel   On: 02/15/2019 10:07   Ct Abdomen Pelvis W Contrast  Result Date: 02/14/2019 CLINICAL DATA:  Vomiting. Confusion. Upper abdominal tenderness. EXAM: CT ABDOMEN AND PELVIS WITH CONTRAST TECHNIQUE: Multidetector CT imaging of the abdomen and pelvis was performed using the standard protocol following bolus administration of intravenous contrast. CONTRAST:  100mL OMNIPAQUE IOHEXOL 300 MG/ML  SOLN COMPARISON:  None. FINDINGS: Lower chest: 6 mm perifissural nodule in the right lower lobe. Bilateral lower lobe atelectasis, left greater than right. There are coronary artery calcifications. Mitral annulus calcifications. Hepatobiliary: No focal hepatic abnormality. Mild periportal edema. Distended gallbladder. Pericholecystic haziness with possible gallbladder wall thickening no calcified gallstone. Common bile duct measures 7 mm. Pancreas: Mild peripancreatic edema about the head versus motion artifact. No pancreatic ductal dilatation. Spleen: Elongated spanning 15 cm cranial caudal. 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 Questionable additional splenic artery aneurysm is inferiorly as well as adjacent to the larger lesion. No suspicious splenic lesion. Adrenals/Urinary Tract: Normal adrenal glands. No hydronephrosis or perinephric edema. Homogeneous renal enhancement with symmetric excretion on delayed phase imaging. Mild lobular renal contours bilaterally. Urinary bladder is partially distended, there is mild bladder wall thickening. Stomach/Bowel: Nondistended stomach. No small bowel obstruction or inflammation. Appendix not visualized, history of appendectomy. Diverticulosis in sigmoid colon without  diverticulitis. Stool within the rectum with mild rectal wall thickening, small volume of colonic stool elsewhere. Vascular/Lymphatic: Aorto bi-iliac atherosclerosis. No aneurysm. No adenopathy. Reproductive: Uterus and bilateral adnexa are unremarkable. Other: No free air, free fluid, or intra-abdominal fluid collection. Musculoskeletal: Severe L1 compression fracture with posterior cortical buckling, chronic based on lumbar spine radiographs from 2019. Remote bilateral superior and inferior pubic rami fractures. Findings suspicious for remote right possibly left sacral insufficiency fractures. Bones are significantly under mineralized. Surgical fixation of bilateral proximal femurs. IMPRESSION: 1. Distended gallbladder with pericholecystic haziness and possible gallbladder wall thickening. Findings suspicious for acute cholecystitis. Recommend further evaluation with right upper quadrant ultrasound. 2. Mild peripancreatic edema about the head of the pancreas versus motion artifact. Recommend correlation with pancreatic enzymes to exclude acute pancreatitis. 3. 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 Recommend annual cross-sectional imaging surveillance, with consideration given to patient's age and demographics. This recommendation follows ACR consensus guidelines: White Paper of the ACR Incidental Findings Committee II on Vascular Findings. J Am  Coll Radiol 2013;10:789-794. 4. Mild urinary bladder wall thickening, recommend correlation with urinalysis to exclude urinary tract infection. 5. Sigmoid colonic diverticulosis without diverticulitis. 6. Right lower lobe 6 mm perifissural nodule. Non-contrast chest CT at 6-12 months is recommended. If the nodule is stable at time of repeat CT, then future CT at 18-24 months (from today's scan) is considered optional for low-risk patients, but is recommended for high-risk patients. This recommendation follows the consensus statement: Guidelines for Management  of Incidental Pulmonary Nodules Detected on CT Images: From the Fleischner Society 2017; Radiology 2017; 284:228-243. 7.  Aortic Atherosclerosis (ICD10-I70.0). Electronically Signed   By: Narda RutherfordMelanie  Sanford M.D.   On: 02/14/2019 22:04   Dg Chest Port 1 View  Result Date: 02/16/2019 CLINICAL DATA:  Fever, hypertension, diabetes mellitus EXAM: PORTABLE CHEST 1 VIEW COMPARISON:  Portable exam 0748 hours compared to 02/15/2019 FINDINGS: Normal heart size, mediastinal contours, and pulmonary vascularity for technique. Bibasilar atelectasis. No definite acute infiltrate, pleural effusion or pneumothorax. BILATERAL glenohumeral degenerative changes and old posttraumatic deformities of the proximal humeri. IMPRESSION: Bibasilar atelectasis. Electronically Signed   By: Ulyses SouthwardMark  Boles M.D.   On: 02/16/2019 09:23   Dg Chest Port 1 View  Result Date: 02/15/2019 CLINICAL DATA:  Hypoxia. EXAM: PORTABLE CHEST 1 VIEW COMPARISON:  Radiograph February 14, 2019. FINDINGS: The heart size and mediastinal contours are within normal limits. No pneumothorax is noted. Increased right basilar atelectasis or infiltrate is noted. Probable minimal bilateral pleural effusions are noted. The visualized skeletal structures are unremarkable. IMPRESSION: Increased right basilar subsegmental atelectasis or infiltrate. Probable minimal pleural effusions. Electronically Signed   By: Lupita RaiderJames  Green Jr M.D.   On: 02/15/2019 07:14   Dg Chest Portable 1 View  Result Date: 02/14/2019 CLINICAL DATA:  Shortness of breath, emesis. EXAM: PORTABLE CHEST 1 VIEW COMPARISON:  12/05/2018 FINDINGS: Cardiomediastinal silhouette is normal. Mediastinal contours appear intact. Calcific atherosclerotic disease of the aorta. There is no evidence of focal airspace consolidation, pleural effusion or pneumothorax. Osseous structures are without acute abnormality. Soft tissues are grossly normal. IMPRESSION: No active disease. Electronically Signed   By: Ted Mcalpineobrinka  Dimitrova  M.D.   On: 02/14/2019 20:34   [place] Abdomen Limited Ruq  Result Date: 02/15/2019 CLINICAL DATA:  Abnormal liver function test. EXAM: ULTRASOUND ABDOMEN LIMITED RIGHT UPPER QUADRANT COMPARISON:  CT scan of February 14, 2019. FINDINGS: Gallbladder: Multiple gallstones are noted with the largest measuring 1.7 cm. Sludge is noted. Gallbladder wall is significantly thickened at 8 mm. Mild pericholecystic fluid is noted. No sonographic Murphy's sign is noted. Common bile duct: Diameter: 4 mm which is within normal limits. Liver: No focal lesion identified. Within normal limits in parenchymal echogenicity. Portal vein is patent on color Doppler imaging with normal direction of blood flow towards the liver. IMPRESSION: Cholelithiasis and sludge is noted within gallbladder lumen with significant gallbladder wall thickening and pericholecystic fluid concerning for acute cholecystitis. Clinical correlation is recommended. Electronically Signed   By: Lupita RaiderJames  Green Jr M.D.   On: 02/15/2019 09:19     CBC Recent Labs  Lab 02/14/19 2004 02/15/19 0416 02/16/19 0551 02/17/19 0619  WBC 16.3* 8.3 8.1 12.9*  HGB 12.7 11.8* 10.5* 11.3*  HCT 41.8 37.8 34.7* 39.3  PLT 139* 110* 88* 105*  MCV 88.4 87.9 89.9 93.3  MCH 26.8 27.4 27.2 26.8  MCHC 30.4 31.2 30.3 28.8*  RDW 15.1 15.3 15.3 15.4  LYMPHSABS  --   --   --  0.7  MONOABS  --   --   --  0.8  EOSABS  --   --   --  0.0  BASOSABS  --   --   --  0.0    Chemistries  Recent Labs  Lab 02/14/19 2004 02/15/19 0416 02/16/19 0551 02/17/19 0619  NA 139 143 147* 146*  K 3.5 3.2* 3.3* 2.5*  CL 105 105 113* 113*  CO2 GLUCOSE 227* 187* 104* 126*  BUN 25* 25* 15 13  CREATININE 0.73 0.69 0.60 0.53  CALCIUM 8.4* 8.3* 7.7* 7.7*  MG  --   --   --  1.7  AST 384* 406* 122* 44*  ALT 276* 432* 238* 144*  ALKPHOS 114 133* 114 116  BILITOT 1.2 1.0 1.5* 1.8*    ------------------------------------------------------------------------------------------------------------------ Recent Labs    02/15/19 0416  CHOL 96  HDL 42  LDLCALC 47  TRIG 36  CHOLHDL 2.3    Lab Results  Component Value Date   HGBA1C 6.0 (H) 01/22/2019   ------------------------------------------------------------------------------------------------------------------ No results for input(s): TSH, T4TOTAL, T3FREE, THYROIDAB in the last 72 hours.  Invalid input(s): FREET3 ------------------------------------------------------------------------------------------------------------------ No results for input(s): VITAMINB12, FOLATE, FERRITIN, TIBC, IRON, RETICCTPCT in the last 72 hours.  Coagulation profile Recent Labs  Lab 02/14/19 2004  INR 1.1    Recent Labs    02/15/19 0416  DDIMER 1.26*    Cardiac Enzymes Recent Labs  Lab 02/15/19 0416 02/15/19 1123 02/15/19 1643  CKMB 0.5  --   --   TROPONINI <0.03 <0.03 <0.03   ------------------------------------------------------------------------------------------------------------------ No results found for: BNP   Shon Hale M.D on 02/17/2019 at 7:56 AM  Go to [email or web address] - for contact info  [facility] - Office  [phone]

## 2019-02-18 DIAGNOSIS — Z515 Encounter for palliative care: Secondary | ICD-10-CM

## 2019-02-18 DIAGNOSIS — F0150 Vascular dementia without behavioral disturbance: Secondary | ICD-10-CM

## 2019-02-18 DIAGNOSIS — Z7189 Other specified counseling: Secondary | ICD-10-CM

## 2019-02-18 LAB — GLUCOSE, CAPILLARY
Glucose-Capillary: 121 mg/dL — ABNORMAL HIGH (ref 70–99)
Glucose-Capillary: 123 mg/dL — ABNORMAL HIGH (ref 70–99)
Glucose-Capillary: 125 mg/dL — ABNORMAL HIGH (ref 70–99)
Glucose-Capillary: 139 mg/dL — ABNORMAL HIGH (ref 70–99)
Glucose-Capillary: 144 mg/dL — ABNORMAL HIGH (ref 70–99)
Glucose-Capillary: 156 mg/dL — ABNORMAL HIGH (ref 70–99)
Glucose-Capillary: 177 mg/dL — ABNORMAL HIGH (ref 70–99)

## 2019-02-18 LAB — COMPREHENSIVE METABOLIC PANEL
ALT: 92 U/L — ABNORMAL HIGH (ref 0–44)
AST: 27 U/L (ref 15–41)
Albumin: 2.4 g/dL — ABNORMAL LOW (ref 3.5–5.0)
Alkaline Phosphatase: 104 U/L (ref 38–126)
Anion gap: 6 (ref 5–15)
BUN: 14 mg/dL (ref 8–23)
CO2: 25 mmol/L (ref 22–32)
Calcium: 7.9 mg/dL — ABNORMAL LOW (ref 8.9–10.3)
Chloride: 114 mmol/L — ABNORMAL HIGH (ref 98–111)
Creatinine, Ser: 0.48 mg/dL (ref 0.44–1.00)
GFR calc Af Amer: >60 mL/min (ref >60)
GFR calc non Af Amer: >60 mL/min (ref >60)
Glucose, Bld: 162 mg/dL — ABNORMAL HIGH (ref 70–99)
Potassium: 3.3 mmol/L — ABNORMAL LOW (ref 3.5–5.1)
Sodium: 145 mmol/L (ref 135–145)
Total Bilirubin: 2 mg/dL — ABNORMAL HIGH (ref 0.3–1.2)
Total Protein: 5 g/dL — ABNORMAL LOW (ref 6.5–8.1)

## 2019-02-18 LAB — CBC
HCT: 38.3 % (ref 36.0–46.0)
Hemoglobin: 11.6 g/dL — ABNORMAL LOW (ref 12.0–15.0)
MCH: 26.8 pg (ref 26.0–34.0)
MCHC: 30.3 g/dL (ref 30.0–36.0)
MCV: 88.5 fL (ref 80.0–100.0)
Platelets: 117 10*3/uL — ABNORMAL LOW (ref 150–400)
RBC: 4.33 MIL/uL (ref 3.87–5.11)
RDW: 15.1 % (ref 11.5–15.5)
WBC: 12.6 10*3/uL — ABNORMAL HIGH (ref 4.0–10.5)
nRBC: 0 % (ref 0.0–0.2)

## 2019-02-18 MED ORDER — METOPROLOL TARTRATE 5 MG/5ML IV SOLN
5.0000 mg | Freq: Four times a day (QID) | INTRAVENOUS | Status: DC
  Administered 2019-02-18 – 2019-02-19 (×4): 5 mg via INTRAVENOUS
  Filled 2019-02-18 (×4): qty 5

## 2019-02-18 MED ORDER — HYDROMORPHONE HCL 1 MG/ML IJ SOLN
0.5000 mg | Freq: Once | INTRAMUSCULAR | Status: AC
  Administered 2019-02-18: 08:00:00 0.5 mg via INTRAVENOUS
  Filled 2019-02-18: qty 0.5

## 2019-02-18 MED ORDER — POTASSIUM CHLORIDE 10 MEQ/100ML IV SOLN
10.0000 meq | INTRAVENOUS | Status: AC
  Administered 2019-02-18 (×4): 10 meq via INTRAVENOUS
  Filled 2019-02-18 (×4): qty 100

## 2019-02-18 MED ORDER — HYDROMORPHONE HCL 1 MG/ML IJ SOLN
0.5000 mg | Freq: Once | INTRAMUSCULAR | Status: AC
  Administered 2019-02-18: 23:00:00 0.5 mg via INTRAVENOUS
  Filled 2019-02-18: qty 0.5

## 2019-02-18 NOTE — Progress Notes (Signed)
Pharmacy Antibiotic Note  Kristin Grant is a 78 y.o. female admitted on 02/14/2019 with cholecystitis.  Pharmacy has been consulted for Zosyn dosing.  Plan: Continue Zosyn 3.375g IV every 8 hours. Monitor labs, c/s, and patient improvement.   Height: 5\' 6"  (167.6 cm) Weight: 140 lb 6.9 oz (63.7 kg) IBW/kg (Calculated) : 59.3  Temp (24hrs), Avg:98.3 F (36.8 C), Min:98.2 F (36.8 C), Max:98.4 F (36.9 C)  Recent Labs  Lab 02/14/19 2004 02/14/19 2154 02/15/19 0416 02/16/19 0551 02/17/19 0619 02/18/19 0452  WBC 16.3*  --  8.3 8.1 12.9* 12.6*  CREATININE 0.73  --  0.69 0.60 0.53 0.48  LATICACIDVEN 1.1 1.8  --   --   --   --     Estimated Creatinine Clearance: 52.5 mL/min (by C-G formula based on SCr of 0.48 mg/dL).    No Known Allergies  Antimicrobials this admission: Zosyn 6/21 >>     Dose adjustments this admission: N/A  Microbiology results: 6/23 BCx: ngtd  Thank you for allowing pharmacy to be a part of this patient's care.  Ramond Craver 02/18/2019 9:05 AM

## 2019-02-18 NOTE — Progress Notes (Signed)
Patient Demographics:    Kristin Grant, is a 78 y.o. female, DOB - 1941/03/02, UVO:[id number]  Admit date - 02/14/2019   Admitting Physician Jani Gravel, MD  Outpatient Primary MD for the patient is [facility], [place]ena Nunnery, MD  LOS - 4   Chief Complaint  Patient presents with  . Emesis        Subjective:    Kristin Grant today has no fevers, no emesis,  No chest pain,     Assessment  & Plan :    Principal Problem:   Acute pancreatitis Active Problems:   Hypertension   Hypothyroidism   Diabetes mellitus, type II ([place])   Abnormal liver function  Brief Summary Pt. 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 Currently further plan is continue conservative measures.  A/p  1)Acute Cholecystitis----- Pt is now afebrile, Discussed with general surgeon Dr. Arnoldo Morale advised conservative management, patient is a very high surgical risk patient , ----continue morphine for pain control, and Zofran for nausea vomiting, IV Zosyn as ordered, WBC is 12.6 from 12.1 , discussed with GI service, LFTs trending down (AST is down to 92 from a peak of 332, alkaline phosphatase is down to 104 from a peak of 133, AST is down to 27 from a peak of 4 6 , T bili remains elevated at 2.0 ), right upper quadrant ultrasound without evidence of CBD obstruction at this time, hepatitis profile negative... No GI intervention planned --- continue IV fluids as patient is n.p.o.   2)Type 2 diabetes mellitus, Uncontrolled with hyperglycemia.  STOP Tradjenta => associated with pancreatitis STOP Lantus, Novolog fsbs q4h, ISS Currently on D5 until oral intake is reliable (currently n.p.o. due to aspiration concerns)   3)Hpokalemia/Hypomagnesemia/HypoNatremia--replace and recheck--continue IVF to d5 1/2 NS with KCL while n.p.o.  4)Dysphagia/protein caloric malnutrition --- n.p.o. except meds crushed in pure and ice chips,  speech evaluation appreciated,, patient currently n.p.o. due to aspiration risk... Discussed with patient's son/POA Bary Richard... family does not want PEG tube placement, they may consider comfort feeding with aspiration risk palliative consult pending  4)H/o Prior CVA --- Now with dysphasia as above #4, cont aspirin HOLD Plavix for now,   5)Dementia (baseline ambulates with walker)/Acute metabolic encephalopathy with delirium STOPped mirtazepine as above   6)Hypothyroidism Cont Levothyroxine 50 micrograms po qday  7)Hypertension/Tachycardia--- patient was on propanolol PTA, BP and heart rate elevated since stopping propanolol, okay to use  metoprolol 5 mg iv every 6 hours with parameters STOP Lisinopril STOP Propranolol  8)Severe compression fracture L1 Please obtain bone density test as outpatient Will need calcium and vitamin D and antiresorptive agent Would avoid Bisphosphonate due to hx of GERD, probably would benefit from Prolia 60mg  [place] q6 months   9)Right Lower Lung 68mm nodule on CT scan Please repeat CT chest noncontrast in 6-12 months  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  11)Social/Ethics--- Discussed with family (son/POA Delfino Lovett and daughter in [place] on 02/18/2019 ----they want Patient to be a  DNI /DNR, they do not want to pursue PEG tube placement  Diet: NPO  DVT Prophylaxis: SCD/heparin  Advance goals of care discussion: Full code  Family Communication:   Discussed with family (son/POA Richard and daughter in Social worker [place] on 02/18/2019   Disposition:  TBD  Consultants: gastroenterology/ General surgery    Disposition/Need for in-Hospital Stay- patient unable to be discharged at this time due to IV antibiotics for acute cholecystitis also needs replacement of electrolyte imbalances IV and IV fluids due to lack of oral intake  Code Status : Full     DVT Prophylaxis  :   - SCDs   Lab Results  Component Value Date   PLT 117 (L) 02/18/2019    Inpatient Medications  Scheduled Meds: . insulin aspart  0-9 Units Subcutaneous Q4H  . metoprolol tartrate  5 mg Intravenous Q6H  . pantoprazole (PROTONIX) IV  40 mg Intravenous Q24H   Continuous Infusions: . dextrose 5 % and 0.45 % NaCl with KCl 40 mEq/L 75 mL/hr at 02/18/19 0300  . piperacillin-tazobactam (ZOSYN)  IV 3.375 g (02/18/19 0952)  . potassium chloride 10 mEq (02/18/19 1107)   PRN Meds:.albuterol, morphine injection, ondansetron (ZOFRAN) IV    Anti-infectives (From admission, onward)   Start     Dose/Rate Route Frequency Ordered Stop   02/15/19 0800  piperacillin-tazobactam (ZOSYN) IVPB 3.375 g     3.375 g 12.5 mL/hr over 240 Minutes Intravenous Every 8 hours 02/15/19 0116     02/14/19 2230  piperacillin-tazobactam (ZOSYN) IVPB 3.375 g     3.375 g 100 mL/hr over 30 Minutes Intravenous  Once 02/14/19 2223 02/14/19 2350        Objective:   Vitals:   02/17/19 1400 02/17/19 2151 02/18/19 0459 02/18/19 0648  BP: (!) 144/62 121/90 127/87 (!) 145/97  Pulse: (!) 110 (!) 124 (!) 125 (!) 127  Resp:  (!) 32 (!) 24   Temp: 98.4 F (36.9 C) 98.2 F (36.8 C) 98.2 F (36.8 C)   TempSrc: Axillary Oral Oral   SpO2: 95% 100% 95% 96%  Weight:    63.7 kg  Height:        Wt Readings from Last 3 Encounters:  02/18/19 63.7 kg  01/22/19 59.9 kg  12/24/18 61.7 kg     Intake/Output Summary (Last 24 hours) at 02/18/2019 1111 Last data filed at 02/18/2019 0700 Gross per 24 hour  Intake 1589.61 ml  Output 1350 ml  Net 239.61 ml     Physical Exam  Gen:- Awake Alert, chronically ill-appearing HEENT:- [place].AT, No sclera icterus  Neck-Supple Neck,No JVD,.  Lungs-  CTAB , fair symmetrical air movement CV- S1, S2 normal, regular , tachycardic heart rate 120 Abd-  +ve B.Sounds, Abd Soft, periumbilical and right upper quadrant tenderness without rebound or guarding,    Extremity/Skin:- No  edema, pedal pulses present  Psych-cognitive deficits, disoriented  neuro-generalized weakness , no new focal deficits, no tremors   Data Review:   Micro Results Recent Results (from the past 240 hour(s))  SARS Coronavirus 2 (CEPHEID - Performed in [facility] hospital lab), Hosp Order     Status: None   Collection Time: 02/14/19 10:20 PM   Specimen: Nasopharyngeal Swab  Result Value Ref Range Status   SARS Coronavirus 2 NEGATIVE NEGATIVE Final    Comment: (NOTE) If result is NEGATIVE SARS-CoV-2 target nucleic acids are NOT DETECTED. The SARS-CoV-2 RNA is generally detectable in upper and lower  respiratory specimens during the acute phase of infection. The  lowest  concentration of SARS-CoV-2 viral copies this assay can detect is 250  copies / mL. A negative result does not preclude SARS-CoV-2 infection  and should not be used as the sole basis for treatment or other  patient management decisions.  A negative result may occur with  improper specimen collection / handling, submission of specimen other  than nasopharyngeal swab, presence of viral mutation(s) within the  areas targeted by this assay, and inadequate number of viral copies  (<250 copies / mL). A negative result must be combined with clinical  observations, patient history, and epidemiological information. If result is POSITIVE SARS-CoV-2 target nucleic acids are DETECTED. The SARS-CoV-2 RNA is generally detectable in upper and lower  respiratory specimens dur ing the acute phase of infection.  Positive  results are indicative of active infection with SARS-CoV-2.  Clinical  correlation with patient history and other diagnostic information is  necessary to  determine patient infection status.  Positive results do  not rule out bacterial infection or co-infection with other viruses. If result is PRESUMPTIVE POSTIVE SARS-CoV-2 nucleic acids MAY BE PRESENT.   A presumptive positive result was obtained on the submitted specimen  and confirmed on repeat testing.  While 2019 novel coronavirus  (SARS-CoV-2) nucleic acids may be present in the submitted sample  additional confirmatory testing may be necessary for epidemiological  and / or clinical management purposes  to differentiate between  SARS-CoV-2 and other Sarbecovirus currently known to infect humans.  If clinically indicated additional testing with an alternate test  methodology [phone]([id number]) is advised. The SARS-CoV-2 RNA is generally  detectable in upper and lower respiratory sp ecimens during the acute  phase of infection. The expected result is Negative. Fact Sheet for Patients:  BoilerBrush.com.cyhttps://www.fda.gov/media/136312/download Fact Sheet for Healthcare Providers: https://pope.com/https://www.fda.gov/media/136313/download This test is not yet approved or cleared by the [place] FDA and has been authorized for detection and/or diagnosis of SARS-CoV-2 by FDA under an Emergency Use Authorization (EUA).  This EUA will remain in effect (meaning this test can be used) for the duration of the COVID-19 declaration under Section 564(b)(1) of the Act, 21 U.S.C. section 360bbb-3(b)(1), unless the authorization is terminated or revoked sooner. Performed at [facility], [address] [place], [place] [address]   Culture, blood (Routine X 2) w Reflex to ID Panel     Status: None (Preliminary result)   Collection Time: 02/16/19  5:51 AM   Specimen: BLOOD RIGHT ARM  Result Value Ref Range Status   Specimen Description BLOOD RIGHT ARM  Final   Special Requests   Final    BOTTLES DRAWN AEROBIC AND ANAEROBIC Blood Culture results may not be optimal due to an excessive volume of blood received in culture bottles    Culture   Final    NO GROWTH 2 DAYS Performed at [facility], [address] [place], [place] [address]    Report Status PENDING  Incomplete  Culture, blood (Routine X 2) w Reflex to ID Panel     Status: None (Preliminary result)   Collection Time: 02/16/19  5:53 AM   Specimen: BLOOD LEFT HAND  Result Value Ref Range Status   Specimen Description BLOOD LEFT HAND  Final   Special Requests   Final    BOTTLES DRAWN AEROBIC AND ANAEROBIC Blood Culture adequate volume   Culture   Final    NO GROWTH 2 DAYS Performed at [facility], [address] [place], [place] [address]    Report Status PENDING  Incomplete  Radiology Reports Ct Head Wo Contrast  Result Date: 02/15/2019 CLINICAL DATA:  Altered mental status. EXAM: CT HEAD WITHOUT CONTRAST TECHNIQUE: Contiguous axial images were obtained from the base of the skull through the vertex without intravenous contrast. COMPARISON:  12/06/2018. FINDINGS: Brain: No evidence for acute infarction, hemorrhage, mass lesion, hydrocephalus, or extra-axial fluid. Generalized atrophy. Hypoattenuation of white matter, likely small vessel disease. Vascular: Calcification of the cavernous internal carotid arteries and distal vertebral arteries, consistent with cerebrovascular atherosclerotic disease. No signs of intracranial large vessel occlusion. Skull: Calvarium intact. Sinuses/Orbits: Chronic RIGHT maxillary sinusitis. Negative orbits. Other: None. IMPRESSION: Atrophy and small vessel disease. No acute intracranial findings. Electronically Signed   By: Elsie Stain M.D.   On: 02/15/2019 10:04   Ct Angio Chest Pe W Or Wo Contrast  Result Date: 02/15/2019 CLINICAL DATA:  Tachycardia, hypoxia EXAM: CT ANGIOGRAPHY CHEST WITH CONTRAST TECHNIQUE: Multidetector CT imaging of the chest was performed using the standard protocol during bolus administration of intravenous contrast. Multiplanar CT image reconstructions and MIPs were obtained to evaluate the vascular  anatomy. CONTRAST:  75mL OMNIPAQUE IOHEXOL 350 MG/ML SOLN COMPARISON:  None. FINDINGS: Cardiovascular: Satisfactory opacification of the pulmonary arteries to the segmental level. No evidence of pulmonary embolism. Normal heart size. No pericardial effusion. Thoracic aortic atherosclerosis. Multi vessel coronary artery atherosclerosis. Mediastinum/Nodes: No enlarged mediastinal, hilar, or axillary lymph nodes. Thyroid gland, trachea, and esophagus demonstrate no significant findings. Lungs/Pleura: Bibasilar at airspace disease likely reflecting atelectasis. No pleural effusion or pneumothorax. No other focal parenchymal airspace disease. Upper Abdomen: No acute abnormality. Musculoskeletal: No acute osseous abnormality. No aggressive osseous lesion. T9 vertebral body sclerotic bone lesion most consistent with a bone island. Chronic L1 vertebral body compression fracture. Mild osteoarthritis of bilateral glenohumeral joints. Review of the MIP images confirms the above findings. IMPRESSION: 1. No evidence of pulmonary embolus. 2. Bibasilar atelectasis. 3.  Aortic Atherosclerosis (ICD10-I70.0). Electronically Signed   By: Elige Ko   On: 02/15/2019 10:07   Ct Abdomen Pelvis W Contrast  Result Date: 02/14/2019 CLINICAL DATA:  Vomiting. Confusion. Upper abdominal tenderness. EXAM: CT ABDOMEN AND PELVIS WITH CONTRAST TECHNIQUE: Multidetector CT imaging of the abdomen and pelvis was performed using the standard protocol following bolus administration of intravenous contrast. CONTRAST:  OMNIPAQUE IOHEXOL 300 MG/ML  SOLN COMPARISON:  None. FINDINGS: Lower chest: 6 mm perifissural nodule in the right lower lobe. Bilateral lower lobe atelectasis, left greater than right. There are coronary artery calcifications. Mitral annulus calcifications. Hepatobiliary: No focal hepatic abnormality. Mild periportal edema. Distended gallbladder. Pericholecystic haziness with possible gallbladder wall thickening no calcified  gallstone. Common bile duct measures 7 mm. Pancreas: Mild peripancreatic edema about the head versus motion artifact. No pancreatic ductal dilatation. Spleen: Elongated spanning 15 cm cranial caudal. 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 Questionable additional splenic artery aneurysm is inferiorly as well as adjacent to the larger lesion. No suspicious splenic lesion. Adrenals/Urinary Tract: Normal adrenal glands. No hydronephrosis or perinephric edema. Homogeneous renal enhancement with symmetric excretion on delayed phase imaging. Mild lobular renal contours bilaterally. Urinary bladder is partially distended, there is mild bladder wall thickening. Stomach/Bowel: Nondistended stomach. No small bowel obstruction or inflammation. Appendix not visualized, history of appendectomy. Diverticulosis in sigmoid colon without diverticulitis. Stool within the rectum with mild rectal wall thickening, small volume of colonic stool elsewhere. Vascular/Lymphatic: Aorto bi-iliac atherosclerosis. No aneurysm. No adenopathy. Reproductive: Uterus and bilateral adnexa are unremarkable. Other: No free air, free fluid, or intra-abdominal fluid collection.  Musculoskeletal: Severe L1 compression fracture with posterior cortical buckling, chronic based on lumbar spine radiographs from 2019. Remote bilateral superior and inferior pubic rami fractures. Findings suspicious for remote right possibly left sacral insufficiency fractures. Bones are significantly under mineralized. Surgical fixation of bilateral proximal femurs. IMPRESSION: 1. Distended gallbladder with pericholecystic haziness and possible gallbladder wall thickening. Findings suspicious for acute cholecystitis. Recommend further evaluation with right upper quadrant ultrasound. 2. Mild peripancreatic edema about the head of the pancreas versus motion artifact. Recommend correlation with pancreatic enzymes to exclude acute pancreatitis. 3.  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 Recommend annual cross-sectional imaging surveillance, with consideration given to patient's age and demographics. This recommendation follows ACR consensus guidelines: White Paper of the ACR Incidental Findings Committee II on Vascular Findings. J Am Coll Radiol 2013;10:789-794. 4. Mild urinary bladder wall thickening, recommend correlation with urinalysis to exclude urinary tract infection. 5. Sigmoid colonic diverticulosis without diverticulitis. 6. Right lower lobe 6 mm perifissural nodule. Non-contrast chest CT at 6-12 months is recommended. If the nodule is stable at time of repeat CT, then future CT at 18-24 months (from today's scan) is considered optional for low-risk patients, but is recommended for high-risk patients. This recommendation follows the consensus statement: Guidelines for Management of Incidental Pulmonary Nodules Detected on CT Images: From the Fleischner Society 2017; Radiology 2017; 284:228-243. 7.  Aortic Atherosclerosis (ICD10-I70.0). Electronically Signed   By: Narda RutherfordMelanie  Sanford M.D.   On: 02/14/2019 22:04   Dg Chest Port 1 View  Result Date: 02/16/2019 CLINICAL DATA:  Fever, hypertension, diabetes mellitus EXAM: PORTABLE CHEST 1 VIEW COMPARISON:  Portable exam 0748 hours compared to 02/15/2019 FINDINGS: Normal heart size, mediastinal contours, and pulmonary vascularity for technique. Bibasilar atelectasis. No definite acute infiltrate, pleural effusion or pneumothorax. BILATERAL glenohumeral degenerative changes and old posttraumatic deformities of the proximal humeri. IMPRESSION: Bibasilar atelectasis. Electronically Signed   By: Ulyses SouthwardMark  Boles M.D.   On: 02/16/2019 09:23   Dg Chest Port 1 View  Result Date: 02/15/2019 CLINICAL DATA:  Hypoxia. EXAM: PORTABLE CHEST 1 VIEW COMPARISON:  Radiograph February 14, 2019. FINDINGS: The heart size and mediastinal contours are within normal limits. No pneumothorax is noted. Increased right  basilar atelectasis or infiltrate is noted. Probable minimal bilateral pleural effusions are noted. The visualized skeletal structures are unremarkable. IMPRESSION: Increased right basilar subsegmental atelectasis or infiltrate. Probable minimal pleural effusions. Electronically Signed   By: Lupita RaiderJames  Green Jr M.D.   On: 02/15/2019 07:14   Dg Chest Portable 1 View  Result Date: 02/14/2019 CLINICAL DATA:  Shortness of breath, emesis. EXAM: PORTABLE CHEST 1 VIEW COMPARISON:  12/05/2018 FINDINGS: Cardiomediastinal silhouette is normal. Mediastinal contours appear intact. Calcific atherosclerotic disease of the aorta. There is no evidence of focal airspace consolidation, pleural effusion or pneumothorax. Osseous structures are without acute abnormality. Soft tissues are grossly normal. IMPRESSION: No active disease. Electronically Signed   By: Ted Mcalpineobrinka  Dimitrova M.D.   On: 02/14/2019 20:34   [place] Abdomen Limited Ruq  Result Date: 02/15/2019 CLINICAL DATA:  Abnormal liver function test. EXAM: ULTRASOUND ABDOMEN LIMITED RIGHT UPPER QUADRANT COMPARISON:  CT scan of February 14, 2019. FINDINGS: Gallbladder: Multiple gallstones are noted with the largest measuring 1.7 cm. Sludge is noted. Gallbladder wall is significantly thickened at 8 mm. Mild pericholecystic fluid is noted. No sonographic Murphy's sign is noted. Common bile duct: Diameter: 4 mm which is within normal limits. Liver: No focal lesion identified. Within normal limits in parenchymal echogenicity. Portal vein is patent on color Doppler imaging  with normal direction of blood flow towards the liver. IMPRESSION: Cholelithiasis and sludge is noted within gallbladder lumen with significant gallbladder wall thickening and pericholecystic fluid concerning for acute cholecystitis. Clinical correlation is recommended. Electronically Signed   By: Lupita RaiderJames  Green Jr M.D.   On: 02/15/2019 09:19     CBC Recent Labs  Lab 02/14/19 2004 02/15/19 0416 02/16/19 0551  02/17/19 0619 02/18/19 0452  WBC 16.3* 8.3 8.1 12.9* 12.6*  HGB 12.7 11.8* 10.5* 11.3* 11.6*  HCT 41.8 37.8 34.7* 39.3 38.3  PLT 139* 110* 88* 105* 117*  MCV 88.4 87.9 89.9 93.3 88.5  MCH 26.8 27.4 27.2 26.8 26.8  MCHC 30.4 31.2 30.3 28.8* 30.3  RDW 15.1 15.3 15.3 15.4 15.1  LYMPHSABS  --   --   --  0.7  --   MONOABS  --   --   --  0.8  --   EOSABS  --   --   --  0.0  --   BASOSABS  --   --   --  0.0  --     Chemistries  Recent Labs  Lab 02/14/19 2004 02/15/19 0416 02/16/19 0551 02/17/19 0619 02/18/19 0452  NA 139 143 147* 146* 145  K 3.5 3.2* 3.3* 2.5* 3.3*  CL 105 105 113* 113* 114*  CO2 25 27 28 23 25   GLUCOSE 227* 187* 104* 126* 162*  BUN 25* 25* 15 13 14   CREATININE 0.73 0.69 0.60 0.53 0.48  CALCIUM 8.4* 8.3* 7.7* 7.7* 7.9*  MG  --   --   --  1.7  --   AST 384* 406* 122* 44* 27  ALT 276* 432* 238* 144* 92*  ALKPHOS 114 133* 114 116 104  BILITOT 1.2 1.0 1.5* 1.8* 2.0*   ------------------------------------------------------------------------------------------------------------------ No results for input(s): CHOL, HDL, LDLCALC, TRIG, CHOLHDL, LDLDIRECT in the last 72 hours.  Lab Results  Component Value Date   HGBA1C 6.0 (H) 01/22/2019   ------------------------------------------------------------------------------------------------------------------ No results for input(s): TSH, T4TOTAL, T3FREE, THYROIDAB in the last 72 hours.  Invalid input(s): FREET3 ------------------------------------------------------------------------------------------------------------------ No results for input(s): VITAMINB12, FOLATE, FERRITIN, TIBC, IRON, RETICCTPCT in the last 72 hours.  Coagulation profile Recent Labs  Lab 02/14/19 2004  INR 1.1    No results for input(s): DDIMER in the last 72 hours.  Cardiac Enzymes Recent Labs  Lab 02/15/19 0416 02/15/19 1123 02/15/19 1643  CKMB 0.5  --   --   TROPONINI <0.03 <0.03 <0.03    ------------------------------------------------------------------------------------------------------------------ No results found for: BNP   Shon Haleourage Sakib Noguez M.D on 02/18/2019 at 11:11 AM  Go to [email or web address] - for contact info  [facility] - Office  [phone]

## 2019-02-18 NOTE — Progress Notes (Signed)
Sat in with call with Dr. Roxan Hockey with patient's son Calyn Sivils and daughter in law Jeyli Zwicker, and son made patient a DNR/DNI.  Family to meet with Dr. in the morning to further discuss plan of care.

## 2019-02-18 NOTE — Consult Note (Signed)
Consultation Note Date: 02/18/2019   Patient Name: Kristin Grant  DOB: 12-14-1940  MRN: [id number]  Age / Sex: 78 y.o., female  PCP: Salley Scarleturham, Kawanta F, MD Referring Physician: Shon HaleEmokpae, Courage, MD  Reason for Consultation: Establishing goals of care  HPI/Patient Profile: 78 y.o. female  with past medical history of dementia, CVA, recent L hip fx s/p ORIF, DM2, admitted on 02/14/2019 with nausea, vomiting, workup acute cholecystitis, as well as severe dysphagia and likely aspiration. Per discussion with attending team family has decided to make patient DNR and has stated no artificial feeding. Palliative medicine consulted for further GOC.  Clinical Assessment and Goals of Care: Reviewed chart thoroughly and discussed patient with Dr. Mariea ClontsEmokpae. Patient in bed s/p SLP session. Per SLP she enjoyed ice chips and oral care, however, could not swallow any po. Patient unable to verbalize any answers to questions. Sitting up in bed- appears SOB. Grimacing intermittently. SLP requesting pain medication from RN.  I called and spoke with patient's son - Virgel BouquetRichard Grant.  Patient's current illness state, comorbidities and possible illness trajectories were reviewed.  Options for continued aggressive medical care (IV fluids, IV abx, continued hospitalization) vs transition to comfort care (stopping measures that are intended to be life prolonging, giving medications for comfort and allowing for natural death) were discussed. Kristin states he has felt since his mother's last hips fracture that she was soon coming to EOL. He and his sister are en route to hospital. He is feeling it is time to let his Mom go peacefully. [facility] and would like to meet and discuss with patient's attending MD.  Primary Decision Maker NEXT OF KIN- patient's son and daughter    SUMMARY OF RECOMMENDATIONS -Patient's  son arriving tomorrow- he states he is likely to transition Mom to full comfort care- he would like to meet as planned with Dr. Mariea ClontsEmokpae -Answered son's questions re: Hospice -Would recommend residential Hospice House for  Discharge if transitioned to comfort care -If transitioned to comfort care- would recommend stopping IV fluids, labs, antibiotics, and starting scheduled morphine- possible continuous morphine infusion if patient continues to be short of breath -Unfortunately PMT will be off service until Monday  6/29 at this location- but please feel free to reach out to PMT providers on service if symptom management assistance is needed by phone at [phone]   Code Status/Advance Care Planning:  DNR  Prognosis:    Unable to determine- would be less than 2 weeks if transitioned to full comfort measures  Discharge Planning: To Be Determined  Primary Diagnoses: Present on Admission: . Acute pancreatitis . Hypertension . Hypothyroidism   I have reviewed the medical record, interviewed the patient and family, and examined the patient. The following aspects are pertinent.  Past Medical History:  Diagnosis Date  . Alzheimer disease (HCC)   . Arthritis   . Coronary artery disease   . Diabetes mellitus   . Difficulty walking   . Hypertension   . Reflux   . Stroke (  [place])    Social History   Socioeconomic History  . Marital status: Widowed    Spouse name: Not on file  . Number of children: Not on file  . Years of education: Not on file  . Highest education level: Not on file  Occupational History  . Not on file  Social Needs  . Financial resource strain: Not on file  . Food insecurity    Worry: Not on file    Inability: Not on file  . Transportation needs    Medical: Not on file    Non-medical: Not on file  Tobacco Use  . Smoking status: Never Smoker  . Smokeless tobacco: Never Used  Substance and Sexual Activity  . Alcohol use: No  . Drug use: No  . Sexual  activity: Not on file  Lifestyle  . Physical activity    Days per week: Not on file    Minutes per session: Not on file  . Stress: Not on file  Relationships  . Social Herbalist on phone: Not on file    Gets together: Not on file    Attends religious service: Not on file    Active member of club or organization: Not on file    Attends meetings of clubs or organizations: Not on file    Relationship status: Not on file  Other Topics Concern  . Not on file  Social History Narrative  . Not on file   Family History  Problem Relation Age of Onset  . Hypertension Mother   . Heart disease Mother   . Hypertension Father   . Heart disease Father   . Hypertension Sister   . Heart disease Sister   . Cancer Sister        BREAST   . Hypertension Brother   . Heart disease Brother    Scheduled Meds: . insulin aspart  0-9 Units Subcutaneous Q4H  . metoprolol tartrate  5 mg Intravenous Q6H  . pantoprazole (PROTONIX) IV  40 mg Intravenous Q24H   Continuous Infusions: . dextrose 5 % and 0.45 % NaCl with KCl 40 mEq/L 75 mL/hr at 02/18/19 0300  . piperacillin-tazobactam (ZOSYN)  IV 3.375 g (02/18/19 0952)   PRN Meds:.albuterol, morphine injection, ondansetron (ZOFRAN) IV Medications Prior to Admission:  Prior to Admission medications   Medication Sig Start Date End Date Taking? Authorizing Provider  albuterol (VENTOLIN HFA) 108 (90 Base) MCG/ACT inhaler Inhale 2 puffs into the lungs every 6 (six) hours as needed for wheezing or shortness of breath. 02/05/19  Yes [place], Modena Nunnery, MD  aspirin EC 81 MG tablet Take 1 tablet (81 mg total) by mouth daily. Patient taking differently: Take 325 mg by mouth daily.  02/05/19  Yes [place], Modena Nunnery, MD  clopidogrel (PLAVIX) 75 MG tablet TAKE 1 TABLET (75mg ) BY MOUTH ONCE DAILY. Patient taking differently: Take 75 mg by mouth daily.  02/05/19  Yes [place], Modena Nunnery, MD  Ferrous Sulfate (IRON) 325 (65 Fe) MG TABS Take 1 tablet (325 mg total)  by mouth daily. 02/05/19  Yes [place], Modena Nunnery, MD  insulin glargine (LANTUS) 100 UNIT/ML injection Inject 14 Units into the skin at bedtime.   Yes [provider]  insulin lispro (HUMALOG KWIKPEN) 100 UNIT/ML KwikPen Inject 0.06 mLs (6 Units total) into the skin 2 (two) times daily with a meal. Lunch and dinner. 02/05/19  Yes [place], Modena Nunnery, MD  levothyroxine (SYNTHROID) 50 MCG tablet Take 1 tablet (50 mcg total)  by mouth daily. 02/05/19  Yes [place], Velna HatchetKawanta F, MD  linagliptin (TRADJENTA) 5 MG TABS tablet Take 1 tablet (5 mg total) by mouth daily. 02/05/19  Yes [place], Velna HatchetKawanta F, MD  lisinopril (ZESTRIL) 40 MG tablet Take 1 tablet (40 mg total) by mouth daily. 02/05/19  Yes [place], Velna HatchetKawanta F, MD  mirtazapine (REMERON) 7.5 MG tablet Take 1 tablet (7.5 mg total) by mouth at bedtime. 02/05/19  Yes [place], Velna HatchetKawanta F, MD  Multiple Vitamin (DAILY VITE) TABS Take 1 tablet by mouth daily. 02/05/19  Yes [place], Velna HatchetKawanta F, MD  propranolol (INDERAL) 40 MG tablet Take 1 tablet (40 mg total) by mouth 2 (two) times daily. 02/05/19  Yes [place], Velna HatchetKawanta F, MD  NOVOFINE AUTOCOVER 30G X 8 MM MISC USE AS DIRECTED FOR INSULIN ADMINISTRATION 3 TIMES DAILY. Patient not taking: Reported on 02/14/2019 11/23/18   Salley Scarleturham, Kawanta F, MD  traMADol (ULTRAM) 50 MG tablet Take 1 tablet (50 mg total) by mouth every 6 (six) hours as needed for moderate pain. Patient not taking: Reported on 02/14/2019 02/05/19   Salley Scarleturham, Kawanta F, MD   No Known Allergies Review of Systems  Unable to perform ROS   Physical Exam Vitals signs and nursing note reviewed.  Constitutional:      Appearance: She is ill-appearing.  HENT:     Head: Normocephalic.  Cardiovascular:     Rate and Rhythm: Tachycardia present.  Pulmonary:     Comments: Increased RR Skin:    Coloration: Skin is pale.  Neurological:     Mental Status: She is disoriented.     Comments: Unintelligible verbalizations     Vital Signs: BP (!) 145/97   Pulse (!) 127    Temp 98.2 F (36.8 C) (Oral)   Resp (!) 24   Ht 5\' 6"  (1.676 m)   Wt 63.7 kg   SpO2 96%   BMI 22.67 kg/m  Pain Scale: PAINAD   Pain Score: 0-No pain   SpO2: SpO2: 96 % O2 Device:SpO2: 96 % O2 Flow Rate: .O2 Flow Rate (L/min): 1.5 L/min  IO: Intake/output summary:   Intake/Output Summary (Last 24 hours) at 02/18/2019 1308 Last data filed at 02/18/2019 0700 Gross per 24 hour  Intake 1589.61 ml  Output 1350 ml  Net 239.61 ml    LBM: Last BM Date: 02/14/19 Baseline Weight: Weight: 58.1 kg Most recent weight: Weight: 63.7 kg     Palliative Assessment/Data: PPS: 10%     Thank you for this consult. Palliative medicine will continue to follow and assist as needed.   Time In: 1200 Time Out: 1315 Time Total: 75 minutes Greater than 50%  of this time was spent counseling and coordinating care related to the above assessment and plan.  Signed by: Ocie BobKasie Brigham Cobbins, AGNP-C Palliative Medicine    Please contact Palliative Medicine Team phone at [phone] for questions and concerns.  For individual provider: See Loretha StaplerAmion

## 2019-02-18 NOTE — Progress Notes (Signed)
Patient's heart rate in the 120s-130s. Midlevel paged. Midlevel ordered Lopressor 5mg  once. Patient given Lopressor 5mg . HR decreased to the 100s. Will continue to monitor paitent.

## 2019-02-18 NOTE — Consult Note (Signed)
Speech Language Pathology Dysphagia Treatment Patient Details Name: Kristin Grant MRN: [id number] DOB: 03-10-1941 Today's Date: 02/18/2019 Time: 3570-1779 SLP Time Calculation (min) (ACUTE ONLY): 18 min  Assessment / Plan / Recommendation Clinical Impression   Pt with overt s/s of aspiration including respiratory changes (increased expiratory wheezing), wet vocal quality and multiple swallows with presbyphagia yielding a delayed swallow response as well; pt's swallow initiated, but airway compromise is likely from symptoms noted; aggressive oral care administered prior to ice chip administration d/t xerostomia with pt stating "that feels good" during session; she answered yes/no questions and followed simple commands during session with mod verbal/tactile cues from SLP; recommend continue NPO status d/t severe risk for aspiration and ST will f/u pending palliative GOC/family wishes.    Diet Recommendation    NPO; ice chips for pleasure if requesting   SLP Plan Other (Comment)(pending palliative consult/intervention/ GOC)   Pertinent Vitals/Pain Elevated HR,BP and febrile during session   Swallowing Goals     General Behavior/Cognition: Cooperative;Lethargic/Drowsy;Requires cueing Patient Positioning: Upright in bed Oral care provided: Yes HPI: Pt. 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 BSE requested as Pt noted to cough with clear liquids yesterday. Pt has been seen by SLP in the past at [facility].  Oral Cavity - Oral Hygiene     Dysphagia Treatment Treatment Methods: Skilled observation;Differential diagnosis;Upgraded PO texture trial Patient observed directly with PO's: Yes Type of PO's observed: Ice chips Feeding: Total assist Oral Phase Signs & Symptoms: Prolonged bolus  formation;Oral holding Pharyngeal Phase Signs & Symptoms: Multiple swallows;Suspected delayed swallow initiation;Delayed throat clear;Changes in respirations Type of cueing: Verbal;Tactile;Visual Amount of cueing: Moderate        Elvina Sidle, M.S., CCC-SLP 02/18/2019, 2:42 PM

## 2019-02-19 ENCOUNTER — Encounter (HOSPITAL_COMMUNITY): Payer: Self-pay | Admitting: Gastroenterology

## 2019-02-19 LAB — COMPREHENSIVE METABOLIC PANEL
ALT: 54 U/L — ABNORMAL HIGH (ref 0–44)
AST: 28 U/L (ref 15–41)
Albumin: 1.9 g/dL — ABNORMAL LOW (ref 3.5–5.0)
Alkaline Phosphatase: 95 U/L (ref 38–126)
Anion gap: 6 (ref 5–15)
BUN: 15 mg/dL (ref 8–23)
CO2: 21 mmol/L — ABNORMAL LOW (ref 22–32)
Calcium: 7.6 mg/dL — ABNORMAL LOW (ref 8.9–10.3)
Chloride: 115 mmol/L — ABNORMAL HIGH (ref 98–111)
Creatinine, Ser: 0.54 mg/dL (ref 0.44–1.00)
GFR calc Af Amer: >60 mL/min (ref >60)
GFR calc non Af Amer: >60 mL/min (ref >60)
Glucose, Bld: 153 mg/dL — ABNORMAL HIGH (ref 70–99)
Potassium: 4.9 mmol/L (ref 3.5–5.1)
Sodium: 142 mmol/L (ref 135–145)
Total Bilirubin: 1.9 mg/dL — ABNORMAL HIGH (ref 0.3–1.2)
Total Protein: 4 g/dL — ABNORMAL LOW (ref 6.5–8.1)

## 2019-02-19 LAB — CBC
HCT: 35.3 % — ABNORMAL LOW (ref 36.0–46.0)
Hemoglobin: 10.6 g/dL — ABNORMAL LOW (ref 12.0–15.0)
MCH: 26.3 pg (ref 26.0–34.0)
MCHC: 30 g/dL (ref 30.0–36.0)
MCV: 87.6 fL (ref 80.0–100.0)
Platelets: 121 10*3/uL — ABNORMAL LOW (ref 150–400)
RBC: 4.03 MIL/uL (ref 3.87–5.11)
RDW: 15.2 % (ref 11.5–15.5)
WBC: 9.4 10*3/uL (ref 4.0–10.5)
nRBC: 0 % (ref 0.0–0.2)

## 2019-02-19 LAB — GLUCOSE, CAPILLARY
Glucose-Capillary: 116 mg/dL — ABNORMAL HIGH (ref 70–99)
Glucose-Capillary: 128 mg/dL — ABNORMAL HIGH (ref 70–99)
Glucose-Capillary: 144 mg/dL — ABNORMAL HIGH (ref 70–99)
Glucose-Capillary: 147 mg/dL — ABNORMAL HIGH (ref 70–99)

## 2019-02-19 MED ORDER — POLYVINYL ALCOHOL 1.4 % OP SOLN: 1.0000 [drp] | Freq: Four times a day (QID) | OPHTHALMIC | 0 refills | Status: AC | PRN

## 2019-02-19 MED ORDER — SODIUM CHLORIDE 0.9 % IV SOLN: 250.0000 mL | INTRAVENOUS | Status: DC | PRN

## 2019-02-19 MED ORDER — BIOTENE DRY MOUTH MT LIQD: 15.0000 mL | OROMUCOSAL | 0 refills | Status: AC | PRN

## 2019-02-19 MED ORDER — SODIUM CHLORIDE 0.9% FLUSH: 3.0000 mL | INTRAVENOUS | Status: DC | PRN

## 2019-02-19 MED ORDER — MORPHINE SULFATE (PF) 2 MG/ML IV SOLN
2.0000 mg | INTRAVENOUS | Status: DC | PRN
  Administered 2019-02-19: 2 mg via INTRAVENOUS
  Filled 2019-02-19: qty 1

## 2019-02-19 MED ORDER — ALBUTEROL SULFATE (2.5 MG/3ML) 0.083% IN NEBU: 2.5000 mg | INHALATION_SOLUTION | RESPIRATORY_TRACT | 12 refills | Status: AC | PRN

## 2019-02-19 MED ORDER — ACETAMINOPHEN 325 MG PO TABS: 650.0000 mg | ORAL_TABLET | Freq: Four times a day (QID) | ORAL | 0 refills | Status: AC | PRN

## 2019-02-19 MED ORDER — DIPHENHYDRAMINE HCL 50 MG/ML IJ SOLN: 12.5000 mg | INTRAMUSCULAR | Status: DC | PRN

## 2019-02-19 MED ORDER — OLANZAPINE 5 MG PO TBDP: 5.0000 mg | ORAL_TABLET | Freq: Every day | ORAL | 0 refills | Status: AC

## 2019-02-19 MED ORDER — BISACODYL 10 MG RE SUPP: 10.0000 mg | Freq: Every day | RECTAL | Status: DC | PRN

## 2019-02-19 MED ORDER — POLYVINYL ALCOHOL 1.4 % OP SOLN: 1.0000 [drp] | Freq: Four times a day (QID) | OPHTHALMIC | Status: DC | PRN

## 2019-02-19 MED ORDER — SODIUM CHLORIDE 0.9% FLUSH: 3.0000 mL | Freq: Two times a day (BID) | INTRAVENOUS | Status: DC

## 2019-02-19 MED ORDER — BIOTENE DRY MOUTH MT LIQD: 15.0000 mL | OROMUCOSAL | Status: DC | PRN

## 2019-02-19 MED ORDER — ACETAMINOPHEN 325 MG PO TABS: 650.0000 mg | ORAL_TABLET | Freq: Four times a day (QID) | ORAL | Status: DC | PRN

## 2019-02-19 MED ORDER — ACETAMINOPHEN 650 MG RE SUPP: 650.0000 mg | Freq: Four times a day (QID) | RECTAL | Status: DC | PRN

## 2019-02-19 MED ORDER — LORAZEPAM 1 MG PO TABS: 1.0000 mg | ORAL_TABLET | ORAL | Status: DC | PRN

## 2019-02-19 MED ORDER — ONDANSETRON HCL 4 MG/2ML IJ SOLN: 4.0000 mg | INTRAMUSCULAR | Status: DC | PRN

## 2019-02-19 MED ORDER — LORAZEPAM 1 MG PO TABS: 1.0000 mg | ORAL_TABLET | ORAL | 0 refills | Status: AC | PRN

## 2019-02-19 MED ORDER — SODIUM CHLORIDE 0.9 % IV SOLN
12.5000 mg | Freq: Four times a day (QID) | INTRAVENOUS | Status: DC | PRN
  Filled 2019-02-19: qty 0.5

## 2019-02-19 MED ORDER — BISACODYL 10 MG RE SUPP: 10.0000 mg | Freq: Every day | RECTAL | 0 refills | Status: AC | PRN

## 2019-02-19 MED ORDER — LORAZEPAM 2 MG/ML IJ SOLN: 1.0000 mg | INTRAMUSCULAR | Status: DC | PRN

## 2019-02-19 MED ORDER — LORAZEPAM 2 MG/ML PO CONC: 1.0000 mg | ORAL | Status: DC | PRN

## 2019-02-19 MED ORDER — ALBUTEROL SULFATE (2.5 MG/3ML) 0.083% IN NEBU: 2.5000 mg | INHALATION_SOLUTION | RESPIRATORY_TRACT | Status: DC | PRN

## 2019-02-19 MED ORDER — OLANZAPINE 5 MG PO TBDP
5.0000 mg | ORAL_TABLET | Freq: Every day | ORAL | Status: DC
  Administered 2019-02-19: 14:00:00 5 mg via ORAL
  Filled 2019-02-19 (×4): qty 1

## 2019-02-19 NOTE — Progress Notes (Signed)
Report called to [employer], Therapist, sports at [facility]. IV removed, family informed. Case mgmt notified to arrange transport. Family remains at bedside. Will continue to monitor.

## 2019-02-19 NOTE — Discharge Summary (Signed)
Kristin Grant, is a 78 y.o. female  DOB 1940-10-22  MRN [id number].  Admission date:  02/14/2019  Admitting Physician  Jani Gravel, MD  Discharge Date:  02/19/2019   Primary MD  [name], MD  Recommendations for primary care physician for things to follow:   Comfort care----transfer to residential hospice  Admission Diagnosis  Acute cholecystitis [K81.0] Abnormal liver function [R94.5] Acute biliary pancreatitis, unspecified complication status [N23.55] Acute pancreatitis [K85.90]   Discharge Diagnosis  Acute cholecystitis [K81.0] Abnormal liver function [R94.5] Acute biliary pancreatitis, unspecified complication status [D32.20] Acute pancreatitis [K85.90]    Principal Problem:   Acute pancreatitis Active Problems:   Hypertension   Hypothyroidism   Diabetes mellitus, type II ([place])   Abnormal liver function   Advanced care planning/counseling discussion   Goals of care, counseling/discussion   Palliative care by specialist      Past Medical History:  Diagnosis Date   Alzheimer disease ([place])    Arthritis    Coronary artery disease    Diabetes mellitus    Difficulty walking    Hypertension    Reflux    Stroke [facility])     Past Surgical History:  Procedure Laterality Date   APPENDECTOMY     FINGER DEBRIDEMENT     INTRAMEDULLARY (IM) NAIL INTERTROCHANTERIC Right 08/03/2018   Procedure: INTRAMEDULLARY (IM) NAIL INTERTROCHANTRIC;  Surgeon: Marybelle Killings, MD;  Location: [place];  Service: Orthopedics;  Laterality: Right;   INTRAMEDULLARY (IM) NAIL INTERTROCHANTERIC Left 12/04/2018   Procedure: INTRAMEDULLARY (IM) NAIL INTERTROCHANTRIC;  Surgeon: Marybelle Killings, MD;  Location: [place];  Service: Orthopedics;  Laterality: Left;       HPI  from the history and physical done on the day of admission:    Kristin Grant  is a 78 y.o. 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  [name],  Not in any obvious pain at this time.  No note of fever, chills, cough, cp, palp, diarrhea, brbpr, dysuria, hematuria.   In ED,  T 97.9  P 91  R 17  Bp 102/42  Pox 98%   covid 19 negative  Wbc 16.3, hgb 12.7, Plt 139 Na 139, K 3.5, Bun 25, Creatinine 0.73 Trop <0.03 Lactic acid 1.1  Ast 384, Alt 276, Alk phos 114, T. Bili 1.2 LDH 457 Lipase 3,121  Pt was given zosyn 3.375gm iv x1 in the ED,   ED spoke with Dr. Arnoldo Morale who will consult on the patient, appreciate input.   Pt will be admitted for pancreatitis,  ? Gallstone.     Hospital Course:   Brief Summary Pt. 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 Currently further plan is continue conservative measures. As per family request patient is now full comfort measures and transitioning to hospice  A/p  1)Acute Cholecystitis----- Pt is now afebrile, Discussed with general surgeon Dr. Arnoldo Morale advised conservative management, patient is a very high surgical risk patient , ----treated with morphine for pain control, and Zofran for nausea vomiting, treated with IV Zosyn , discussed with GI service, LFTs noted, right upper quadrant ultrasound without evidence of CBD obstruction at this time, hepatitis profile negative... No GI intervention planned --- patient was kept n.p.o. and given IV fluids --Overall condition continued to decline despite above measures, pain control remains very challenging ---- As per family request patient is now full comfort measures and transitioning to hospice  2)Type 2 diabetes mellitus,Uncontrolled  with hyperglycemia.  STOP Tradjenta => associated with pancreatitis STOP Lantus, Novolog Patient received iv D5 while n.p.o. due to aspiration concern  3)Hpokalemia/Hypomagnesemia/HypoNatremia--replaced and rechecked  4)Dysphagia/protein caloric malnutrition --- n.p.o. except meds crushed in pure and ice chips,  speech evaluation appreciated,, patient currently n.p.o. due to aspiration risk... Discussed with patient's son/POA Bary Richard... family does not want PEG tube placement-- --As per family request patient is now full comfort measures and transitioning to hospice  4)H/o Prior CVA --- Now with dysphasia as above #4, cont aspirin -As per family request patient is now full comfort measures and transitioning to hospice   5)Dementia (baseline ambulates with walker)/Acute metabolic encephalopathy with delirium STOPped mirtazepine as above   6)Hypothyroidism She was on levothyroxine  7)Hypertension/Tachycardia--- patient was on propanolol PTA, BP and heart rate elevated --she was treated with IV metoprolol, STOP Lisinopril STOP Propranolol  8)Severe compression fracture L1 -As per family request patient is now full comfort measures and transitioning to hospice  9)Right Lower Lung 74m nodule on CT scan -As per family request patient is now full comfort measures and transitioning to hospice  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 -As per family request patient is now full comfort measures and transitioning to hospice  11)Social/Ethics--- Discussed with family (son/POA Richard and daughter in [place] 02/18[place] ----they want Patient to be a DNI /DNR, they do not want to pursue PEG tube placement -As per family request patient is now full comfort measures and transitioning to hospice  Diet: NPO   Advance goals of care discussion:DNR  Family Communication: Discussed with family (son/POA Richard and daughter in [place] 02/18/2019  --- As per family request  patient is now full comfort measures and transitioning to hospice  Disposition: ----As per family request patient is now full comfort measures and transitioning to hospice  Consultants: Gastroenterology/ General surgery   Discharge Condition: ----As per family request patient is now full comfort measures and transitioning to hospice  Follow UP  Follow-up Information    DAlycia Rossetti MD Follow up on 02/25/2019.   Specialty: Family Medicine Why: Thursday at 11:45 with the Dr for your hospital follow up apointment Contact information: [address] [place] [phone]          Diet and Activity recommendation:  As advised  Discharge Instructions    Discharge Instructions    Bed rest   Complete by: As directed    Call MD for:  persistant nausea and vomiting   Complete by: As directed    Call MD for:  severe uncontrolled pain   Complete by: As directed    Call MD for:  temperature >100.4   Complete by: As directed  Discharge Medications     Allergies as of 02/19/2019   No Known Allergies     Medication List    STOP taking these medications   albuterol 108 (90 Base) MCG/ACT inhaler Commonly known as: VENTOLIN HFA Replaced by: albuterol (2.5 MG/3ML) 0.083% nebulizer solution   aspirin EC 81 MG tablet   clopidogrel 75 MG tablet Commonly known as: PLAVIX   Daily Vite Tabs   insulin glargine 100 UNIT/ML injection Commonly known as: LANTUS   insulin lispro 100 UNIT/ML KwikPen Commonly known as: HumaLOG KwikPen   Iron 325 (65 Fe) MG Tabs   levothyroxine 50 MCG tablet Commonly known as: SYNTHROID   linagliptin 5 MG Tabs tablet Commonly known as: Tradjenta   lisinopril 40 MG tablet Commonly known as: ZESTRIL   mirtazapine 7.5 MG tablet Commonly known as: REMERON   NovoFine Autocover 30G X 8 MM Misc Generic drug: Insulin Pen Needle   propranolol 40 MG tablet Commonly known as: INDERAL   traMADol 50 MG  tablet Commonly known as: ULTRAM     TAKE these medications   acetaminophen 325 MG tablet Commonly known as: TYLENOL Take 2 tablets (650 mg total) by mouth every 6 (six) hours as needed for mild pain (or Fever >/= 101).   albuterol (2.5 MG/3ML) 0.083% nebulizer solution Commonly known as: PROVENTIL Take 3 mLs (2.5 mg total) by nebulization every 2 (two) hours as needed for wheezing. Replaces: albuterol 108 (90 Base) MCG/ACT inhaler   antiseptic oral rinse Liqd Apply 15 mLs topically as needed for dry mouth.   bisacodyl 10 MG suppository Commonly known as: DULCOLAX Place 1 suppository (10 mg total) rectally daily as needed for moderate constipation.   LORazepam 1 MG tablet Commonly known as: ATIVAN Take 1 tablet (1 mg total) by mouth every 4 (four) hours as needed for anxiety.   OLANZapine zydis 5 MG disintegrating tablet Commonly known as: ZYPREXA Take 1 tablet (5 mg total) by mouth daily. Start taking on: February 20, 2019   polyvinyl alcohol 1.4 % ophthalmic solution Commonly known as: LIQUIFILM TEARS Place 1 drop into both eyes 4 (four) times daily as needed for dry eyes.       Major procedures and Radiology Reports - PLEASE review detailed and final reports for all details, in brief -   Ct Head Wo Contrast  Result Date: 02/15/2019 CLINICAL DATA:  Altered mental status. EXAM: CT HEAD WITHOUT CONTRAST TECHNIQUE: Contiguous axial images were obtained from the base of the skull through the vertex without intravenous contrast. COMPARISON:  12/06/2018. FINDINGS: Brain: No evidence for acute infarction, hemorrhage, mass lesion, hydrocephalus, or extra-axial fluid. Generalized atrophy. Hypoattenuation of white matter, likely small vessel disease. Vascular: Calcification of the cavernous internal carotid arteries and distal vertebral arteries, consistent with cerebrovascular atherosclerotic disease. No signs of intracranial large vessel occlusion. Skull: Calvarium intact.  Sinuses/Orbits: Chronic RIGHT maxillary sinusitis. Negative orbits. Other: None. IMPRESSION: Atrophy and small vessel disease. No acute intracranial findings. Electronically Signed   By: Staci Righter M.D.   On: 02/15/2019 10:04   Ct Angio Chest Pe W Or Wo Contrast  Result Date: 02/15/2019 CLINICAL DATA:  Tachycardia, hypoxia EXAM: CT ANGIOGRAPHY CHEST WITH CONTRAST TECHNIQUE: Multidetector CT imaging of the chest was performed using the standard protocol during bolus administration of intravenous contrast. Multiplanar CT image reconstructions and MIPs were obtained to evaluate the vascular anatomy. CONTRAST:  18m OMNIPAQUE IOHEXOL 350 MG/ML SOLN COMPARISON:  None. FINDINGS: Cardiovascular: Satisfactory opacification of the pulmonary arteries to the segmental  level. No evidence of pulmonary embolism. Normal heart size. No pericardial effusion. Thoracic aortic atherosclerosis. Multi vessel coronary artery atherosclerosis. Mediastinum/Nodes: No enlarged mediastinal, hilar, or axillary lymph nodes. Thyroid gland, trachea, and esophagus demonstrate no significant findings. Lungs/Pleura: Bibasilar at airspace disease likely reflecting atelectasis. No pleural effusion or pneumothorax. No other focal parenchymal airspace disease. Upper Abdomen: No acute abnormality. Musculoskeletal: No acute osseous abnormality. No aggressive osseous lesion. T9 vertebral body sclerotic bone lesion most consistent with a bone island. Chronic L1 vertebral body compression fracture. Mild osteoarthritis of bilateral glenohumeral joints. Review of the MIP images confirms the above findings. IMPRESSION: 1. No evidence of pulmonary embolus. 2. Bibasilar atelectasis. 3.  Aortic Atherosclerosis (ICD10-I70.0). Electronically Signed   By: Kathreen Devoid   On: 02/15/2019 10:07   Ct Abdomen Pelvis W Contrast  Result Date: 02/14/2019 CLINICAL DATA:  Vomiting. Confusion. Upper abdominal tenderness. EXAM: CT ABDOMEN AND PELVIS WITH CONTRAST  TECHNIQUE: Multidetector CT imaging of the abdomen and pelvis was performed using the standard protocol following bolus administration of intravenous contrast. CONTRAST:  145m OMNIPAQUE IOHEXOL 300 MG/ML  SOLN COMPARISON:  None. FINDINGS: Lower chest: 6 mm perifissural nodule in the right lower lobe. Bilateral lower lobe atelectasis, left greater than right. There are coronary artery calcifications. Mitral annulus calcifications. Hepatobiliary: No focal hepatic abnormality. Mild periportal edema. Distended gallbladder. Pericholecystic haziness with possible gallbladder wall thickening no calcified gallstone. Common bile duct measures 7 mm. Pancreas: Mild peripancreatic edema about the head versus motion artifact. No pancreatic ductal dilatation. Spleen: Elongated spanning 15 cm cranial caudal. 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 Questionable additional splenic artery aneurysm is inferiorly as well as adjacent to the larger lesion. No suspicious splenic lesion. Adrenals/Urinary Tract: Normal adrenal glands. No hydronephrosis or perinephric edema. Homogeneous renal enhancement with symmetric excretion on delayed phase imaging. Mild lobular renal contours bilaterally. Urinary bladder is partially distended, there is mild bladder wall thickening. Stomach/Bowel: Nondistended stomach. No small bowel obstruction or inflammation. Appendix not visualized, history of appendectomy. Diverticulosis in sigmoid colon without diverticulitis. Stool within the rectum with mild rectal wall thickening, small volume of colonic stool elsewhere. Vascular/Lymphatic: Aorto bi-iliac atherosclerosis. No aneurysm. No adenopathy. Reproductive: Uterus and bilateral adnexa are unremarkable. Other: No free air, free fluid, or intra-abdominal fluid collection. Musculoskeletal: Severe L1 compression fracture with posterior cortical buckling, chronic based on lumbar spine radiographs from 2019. Remote  bilateral superior and inferior pubic rami fractures. Findings suspicious for remote right possibly left sacral insufficiency fractures. Bones are significantly under mineralized. Surgical fixation of bilateral proximal femurs. IMPRESSION: 1. Distended gallbladder with pericholecystic haziness and possible gallbladder wall thickening. Findings suspicious for acute cholecystitis. Recommend further evaluation with right upper quadrant ultrasound. 2. Mild peripancreatic edema about the head of the pancreas versus motion artifact. Recommend correlation with pancreatic enzymes to exclude acute pancreatitis. 3. 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 Recommend annual cross-sectional imaging surveillance, with consideration given to patient's age and demographics. This recommendation follows ACR consensus guidelines: White Paper of the ACR Incidental Findings Committee II on Vascular Findings. J Am Coll Radiol 2013;10:789-794. 4. Mild urinary bladder wall thickening, recommend correlation with urinalysis to exclude urinary tract infection. 5. Sigmoid colonic diverticulosis without diverticulitis. 6. Right lower lobe 6 mm perifissural nodule. Non-contrast chest CT at 6-12 months is recommended. If the nodule is stable at time of repeat CT, then future CT at 18-24 months (from today's scan) is considered optional for low-risk patients, but is recommended for high-risk patients.  This recommendation follows the consensus statement: Guidelines for Management of Incidental Pulmonary Nodules Detected on CT Images: From the Fleischner Society 2017; Radiology 2017; 284:228-243. 7.  Aortic Atherosclerosis (ICD10-I70.0). Electronically Signed   By: Keith Rake M.D.   On: 02/14/2019 22:04   Dg Chest Port 1 View  Result Date: 02/16/2019 CLINICAL DATA:  Fever, hypertension, diabetes mellitus EXAM: PORTABLE CHEST 1 VIEW COMPARISON:  Portable exam 0748 hours compared to 02/15/2019 FINDINGS: Normal heart size,  mediastinal contours, and pulmonary vascularity for technique. Bibasilar atelectasis. No definite acute infiltrate, pleural effusion or pneumothorax. BILATERAL glenohumeral degenerative changes and old posttraumatic deformities of the proximal humeri. IMPRESSION: Bibasilar atelectasis. Electronically Signed   By: Lavonia Dana M.D.   On: 02/16/2019 09:23   Dg Chest Port 1 View  Result Date: 02/15/2019 CLINICAL DATA:  Hypoxia. EXAM: PORTABLE CHEST 1 VIEW COMPARISON:  Radiograph February 14, 2019. FINDINGS: The heart size and mediastinal contours are within normal limits. No pneumothorax is noted. Increased right basilar atelectasis or infiltrate is noted. Probable minimal bilateral pleural effusions are noted. The visualized skeletal structures are unremarkable. IMPRESSION: Increased right basilar subsegmental atelectasis or infiltrate. Probable minimal pleural effusions. Electronically Signed   By: Marijo Conception M.D.   On: 02/15/2019 07:14   Dg Chest Portable 1 View  Result Date: 02/14/2019 CLINICAL DATA:  Shortness of breath, emesis. EXAM: PORTABLE CHEST 1 VIEW COMPARISON:  12/05/2018 FINDINGS: Cardiomediastinal silhouette is normal. Mediastinal contours appear intact. Calcific atherosclerotic disease of the aorta. There is no evidence of focal airspace consolidation, pleural effusion or pneumothorax. Osseous structures are without acute abnormality. Soft tissues are grossly normal. IMPRESSION: No active disease. Electronically Signed   By: Fidela Salisbury M.D.   On: 02/14/2019 20:34   US Abdomen Limited Ruq  Result Date: 02/15/2019 CLINICAL DATA:  Abnormal liver function test. EXAM: ULTRASOUND ABDOMEN LIMITED RIGHT UPPER QUADRANT COMPARISON:  CT scan of February 14, 2019. FINDINGS: Gallbladder: Multiple gallstones are noted with the largest measuring 1.7 cm. Sludge is noted. Gallbladder wall is significantly thickened at 8 mm. Mild pericholecystic fluid is noted. No sonographic Murphy's sign is noted.  Common bile duct: Diameter: 4 mm which is within normal limits. Liver: No focal lesion identified. Within normal limits in parenchymal echogenicity. Portal vein is patent on color Doppler imaging with normal direction of blood flow towards the liver. IMPRESSION: Cholelithiasis and sludge is noted within gallbladder lumen with significant gallbladder wall thickening and pericholecystic fluid concerning for acute cholecystitis. Clinical correlation is recommended. Electronically Signed   By: Marijo Conception M.D.   On: 02/15/2019 09:19    Micro Results    Recent Results (from the past 240 hour(s))  SARS Coronavirus 2 (CEPHEID - Performed in [place] hospital lab), Hosp Order     Status: None   Collection Time: 02/14/19 10:20 PM   Specimen: Nasopharyngeal Swab  Result Value Ref Range Status   SARS Coronavirus 2 NEGATIVE NEGATIVE Final    Comment: (NOTE) If result is NEGATIVE SARS-CoV-2 target nucleic acids are NOT DETECTED. The SARS-CoV-2 RNA is generally detectable in upper and lower  respiratory specimens during the acute phase of infection. The lowest  concentration of SARS-CoV-2 viral copies this assay can detect is 250  copies / mL. A negative result does not preclude SARS-CoV-2 infection  and should not be used as the sole basis for treatment or other  patient management decisions.  A negative result may occur with  improper specimen collection / handling, submission of specimen  other  than nasopharyngeal swab, presence of viral mutation(s) within the  areas targeted by this assay, and inadequate number of viral copies  (<250 copies / mL). A negative result must be combined with clinical  observations, patient history, and epidemiological information. If result is POSITIVE SARS-CoV-2 target nucleic acids are DETECTED. The SARS-CoV-2 RNA is generally detectable in upper and lower  respiratory specimens dur ing the acute phase of infection.  Positive  results are indicative of  active infection with SARS-CoV-2.  Clinical  correlation with patient history and other diagnostic information is  necessary to determine patient infection status.  Positive results do  not rule out bacterial infection or co-infection with other viruses. If result is PRESUMPTIVE POSTIVE SARS-CoV-2 nucleic acids MAY BE PRESENT.   A presumptive positive result was obtained on the submitted specimen  and confirmed on repeat testing.  While 2019 novel coronavirus  (SARS-CoV-2) nucleic acids may be present in the submitted sample  additional confirmatory testing may be necessary for epidemiological  and / or clinical management purposes  to differentiate between  SARS-CoV-2 and other Sarbecovirus currently known to infect humans.  If clinically indicated additional testing with an alternate test  methodology [phone]) is advised. The SARS-CoV-2 RNA is generally  detectable in upper and lower respiratory sp ecimens during the acute  phase of infection. The expected result is Negative. Fact Sheet for Patients:  StrictlyIdeas.no Fact Sheet for Healthcare Providers: BankingDealers.co.za This test is not yet approved or cleared by the [place] FDA and has been authorized for detection and/or diagnosis of SARS-CoV-2 by FDA under an Emergency Use Authorization (EUA).  This EUA will remain in effect (meaning this test can be used) for the duration of the COVID-19 declaration under Section 564(b)(1) of the Act, 21 U.S.C. section 360bbb-3(b)(1), unless the authorization is terminated or revoked sooner. Performed at [facility], [address], [place], [place] [address]   Culture, blood (Routine X 2) w Reflex to ID Panel     Status: None (Preliminary result)   Collection Time: 02/16/19  5:51 AM   Specimen: BLOOD RIGHT ARM  Result Value Ref Range Status   Specimen Description BLOOD RIGHT ARM  Final   Special Requests   Final    BOTTLES DRAWN  AEROBIC AND ANAEROBIC Blood Culture results may not be optimal due to an excessive volume of blood received in culture bottles   Culture   Final    NO GROWTH 3 DAYS Performed at [facility], [address], [place], [place] [address]    Report Status PENDING  Incomplete  Culture, blood (Routine X 2) w Reflex to ID Panel     Status: None (Preliminary result)   Collection Time: 02/16/19  5:53 AM   Specimen: BLOOD LEFT HAND  Result Value Ref Range Status   Specimen Description BLOOD LEFT HAND  Final   Special Requests   Final    BOTTLES DRAWN AEROBIC AND ANAEROBIC Blood Culture adequate volume   Culture   Final    NO GROWTH 3 DAYS Performed at [facility], [address] [place], [place] [address]    Report Status PENDING  Incomplete   Today   Subjective    Kristin Grant today ----- As per family request patient is now full comfort measures and transitioning to hospice      Patient has been seen and examined prior to discharge   Objective   Blood pressure 134/76, pulse (!) 105, temperature 98.2 F (36.8 C), resp. rate  17, height 5' 6" (1.676 m), weight 63.9 kg, SpO2 93 %.   Intake/Output Summary (Last 24 hours) at 02/19/2019 1527 Last data filed at 02/18/2019 1700 Gross per 24 hour  Intake 0 ml  Output --  Net 0 ml    Exam Gen:- Awake Alert, chronically ill-appearing HEENT:- [place].AT, No sclera icterus Nose- [place] 2 L/min Neck-Supple Neck,No JVD,.  Lungs-  CTAB , fair symmetrical air movement CV- S1, S2 normal, regular , tachycardic heart rate 120 Abd-  +ve B.Sounds, Abd Soft, periumbilical and right upper quadrant tenderness without rebound or guarding,    Extremity/Skin:- No  edema, pedal pulses present  Psych-cognitive deficits, disoriented  neuro-generalized weakness , no new focal deficits, no tremors   Data Review   CBC w Diff:  Lab Results  Component Value Date   WBC 9.4 02/19/2019   HGB 10.6 (L) 02/19/2019   HCT 35.3 (L) 02/19/2019   PLT 121 (L) 02/19/2019    LYMPHOPCT 5 02/17/2019   MONOPCT 6 02/17/2019   EOSPCT 0 02/17/2019   BASOPCT 0 02/17/2019    CMP:  Lab Results  Component Value Date   NA 142 02/19/2019   K 4.9 02/19/2019   CL 115 (H) 02/19/2019   CO2 21 (L) 02/19/2019   BUN 15 02/19/2019   CREATININE 0.54 02/19/2019   CREATININE 0.67 01/22/2019   PROT 4.0 (L) 02/19/2019   ALBUMIN 1.9 (L) 02/19/2019   BILITOT 1.9 (H) 02/19/2019   ALKPHOS 95 02/19/2019   AST 28 02/19/2019   ALT 54 (H) 02/19/2019  .   Total Discharge time is about 33 minutes  Roxan Hockey M.D on 02/19/2019 at 3:27 PM  Go to [email or web address] -  for contact info  [facility] - Office  [phone]

## 2019-02-19 NOTE — Progress Notes (Signed)
   Family conference with patient's granddaughter who flew in from [place], patient's granddaughter who works within the [employer], patient's son and POA Kristin Grant, and patient's daughter Kristin Grant from [place]----- ---- As per family request patient is now full comfort measures and transitioning to hospice  Transfer to residential hospice

## 2019-02-19 NOTE — Discharge Instructions (Signed)
Comfort care----transfer to residential hospice

## 2019-02-19 NOTE — Progress Notes (Signed)
Spoke with hospitalist. Palliative care consult planned. Non-surgical candidate for likely gallstone pancreatitis with improving LFTs. History of demential. Nothing further to offer at this point. We will sign off for now. Please contact us if we can be of further assistance.  Thank you for allowing [place] to participate in the care of Lona Pokorski  Walden Field, DNP, AGNP-C Adult & Gerontological Nurse Practitioner [facility]

## 2019-02-19 NOTE — Care Management (Signed)
Patient Information  Patient Name  Kristin Grant, Genia ([id number]) Sex  Female DOB  Apr 24, 1941  Room Bed  A315 A315-01  Patient Demographics  Address  [facility]  [address]   [place] [id number] Phone  [phone] (Home)  Patient Ethnicity & Race  Ethnic Group Patient Race  Not Hispanic or Latino White or Caucasian  Emergency Contact(s)  Name Relation Home Work Mobile  GarrettGoins,Richard Son   [phone]  Augustin CoupeGoins, Kimberly Daughter   [phone]  Lynwood DawleyZorn, Ann Daughter   [phone]  Yaw,Betty Daughter [phone]  [phone]  Documents on File   Status Date Received Description  Documents for the Patient  [place] HIPAA NOTICE OF PRIVACY - Scanned Received 12/09/11   [employer] E-Signature HIPAA Notice of Privacy Received 11/21/11   [place] E-Signature HIPAA Notice of Privacy Spanish Not Received    Driver's License Received 11/21/11   Insurance Card Received 11/21/11   Advance Directives/Living Will/HCPOA/POA Not Received    Financial Application Not Received    Driver's License Not Received    Insurance Card Not Received    Insurance Card Received 12/09/11   Insurance Card Not Received    Release of Information Not Received    Release of Information Not Received    AMB HH/NH/Hospice Not Received  04/13 order [facility]  AMB Correspondence Not Received  04/13 ORDER [facility]  AMB Provider Completed Forms Not Received  10/13 PCS referral [place] DHHS  Advanced Beneficiary Notice (ABN) Not Received    AMB Provider Completed Forms Not Received  FL2  AMB Correspondence Not Received  11/14 Rx [facility]   AMB Correspondence Not Received  11/14 FL2 [place] Medicaid   AMB Correspondence Not Received  11/14 Fam Med [facility]  AMB Provider Completed Forms Not Received  12/14 Pub [place] DHHS  AMB HH/NH/Hospice Not Received  12/14 POC [facility]   AMB HH/NH/Hospice  08/21/13 12/14 accident report [facility]  AMB HH/NH/Hospice   09/07/13 PASRR [facility]  AMB HH/NH/Hospice  09/24/13 01/15 order [facility]  AMB Correspondence  09/24/13 01/15 pt rx hx rpt [place] [id number]  AMB Provider Completed Forms  09/24/13 01/15 order [facility]  AMB Correspondence  10/11/13 11/14 office note Cassie FreerHall Jr. MD,  AMB Provider Completed Forms  11/16/13 03/15 phys repsonse/rx [facility]  AMB Provider Completed Forms  12/08/13 04/15 Order [facility]  AMB Correspondence  12/16/13 03/15 ORDER [facility]  AMB HH/NH/Hospice  12/27/13 ORDER [facility]  AMB HH/NH/Hospice  12/27/13 04/15 ORDER [facility]  AMB HH/NH/Hospice  01/13/14 BLOOD GLUCOSE READINGS [facility]  AMB HH/NH/Hospice  02/28/14 RX PHYS ORDER SHEET [facility]  E-Signature AOB Spanish Not Received    AMB HH/NH/Hospice  06/08/14 DIET ORDERS [facility]  AMB Provider Completed Forms  06/08/14 CARE INSTRUCTIONS [facility]  AMB HH/NH/Hospice  06/17/14 PHYSICIAN ORDER SHEET [facility]  AMB Provider Completed Forms  06/30/14 PERSONAL CARE SERVICES  DHHS  AMB Outside Consult Note  06/23/14 RUNYON PHARMD ,C  AMB Provider Completed Forms  07/18/14 ORDER [facility]  AMB Correspondence  07/19/14 ORDERS [facility]  AMB Correspondence  04/15/14 RX [facility]  AMB Provider Completed Forms  08/30/14 ORDER [facility]  AMB Provider Completed Forms  08/30/14 ORDER [facility]  AMB Provider Completed Forms  10/11/14 ORDER [facility]  AMB Provider Completed Forms  09/23/14 CONSULTANT PHARMACIST COMMUNICATION MD [facility]  AMB Provider Completed Forms  10/19/14 MD ORDER [facility]  AMB Provider Completed Forms  12/20/14 CARE INSTRUCTIONS INSULIN DEPT [place] MD,K  AMB HH/NH/Hospice  12/05/14 [facility]   Other Photo ID Not Received    AMB Provider Completed Forms  12/20/14 ORDER [facility]  AMB HH/NH/Hospice  01/05/15 ORDER [facility]  AMB HH/NH/Hospice  06/29/14 ORDER [facility]  AMB Correspondence  03/06/15 PHYSICIANS ORDER SHEET RX CARE  AMB Provider Completed Forms  05/29/15 ORDER SHEET  AMB Patient Logs/Info  06/25/15   AMB Provider Completed Forms  06/15/15 REQUEST FOR PCS [facility]  AMB Correspondence  06/16/15 ORDER [facility]  AMB Correspondence  09/14/15 NOTICE OF DENIAL DHHS  AMB Correspondence  09/13/15 DECISION NOTES [facility]  AMB Correspondence  09/15/15 ORDERS [facility]  AMB Patient Logs/Info  11/01/15   AMB Correspondence  11/16/15 CMN CSC/[place] TRACKS Magdalene Molly/ORDER [facility]  AMB Correspondence  08/29/15 PHYSICIANS ORDER SHEET [facility]  AMB Correspondence  03/19/16 PHYSCIAN ORDER SHEET [facility]  AMB Provider Completed Forms  03/15/16 COMMUNICATION TO PHYSICIANS [facility] [place] MD, K  AMB Correspondence  06/17/16 PHYSICIAIN ORDER SHEET [facility]  AMB Provider Completed Forms  06/24/16 DIET ORDER / CARE  [facility]  AMB Correspondence  06/28/16 LETTER [facility]  AMB Correspondence  08/25/16 ORDER [facility]  AMB Correspondence  09/17/16 PHYSICIAN ORDER [facility]  AMB Correspondence  08/30/16 REFILL AUTHORIZATION REQUEST [facility]  AMB Correspondence  09/04/16 ORDERS [facility]  AMB HH/NH/Hospice  09/16/16 ACCIDENT/INCIDENT REPORT [facility]  AMB Correspondence  08/30/16 REFILL AUTHORIZATION REQUEST [facility]  AMB Correspondence  09/10/16 RX [facility]  AMB HH/NH/Hospice  09/06/16 MESSAGE [facility]  Insurance Card Received 10/02/16 Medicare/Medicaid/ROSM  AMB HH/NH/Hospice  10/04/16 ORDERS [facility]  AMB Correspondence  11/14/16 CMN CSC/[place] TRACKS  AMB Provider Completed Forms  10/16/16 ORDERS [facility]  AMB Provider Completed Forms  11/07/16 ORDER [facility]  AMB Provider Completed Forms  11/27/16 DIET ORDERS  AMB HH/NH/Hospice  11/27/16 PHYSICIANS ORDER SHEET [facility]  AMB Provider Completed Forms  12/12/16 CARE INSTRUCTIONS FOR INSULIN DEPENDANT RESIDENT  AMB Correspondence  11/14/16 PHYS ORDER SHEET [facility]  AMB Provider Completed Forms  12/12/16 DIET ORDERS  AMB Provider Completed Forms  12/11/16 MESSAGE [facility]  AMB Provider Completed Forms  12/26/16 CARE INSTRUCTIONS FOR INSULIN DEPENDENT RESIDENT  AMB Provider Completed Forms  12/19/16 COMMUNICATION TO PHYS [facility]  AMB Provider Completed Forms  12/20/16 [facility]  Release of Information   dpr 2018  AMB HH/NH/Hospice  11/22/16 CERTIFICATION/POC [facility]  AMB Provider Completed Forms  12/11/16 REPORT OF HEALTH SERVICES TO RESIDENTS [facility]  AMB HH/NH/Hospice  01/30/17 05/18 DIABETIC MONITORING [facility]  AMB Provider Completed Forms  03/14/17 07/18 PHYS ORDER SHEET RX CARE [facility]  AMB Correspondence  05/13/17 ORDER [facility]  AMB Correspondence Received 06/20/17 CONSULTANT PHARMACIST COMMUNICATION PHYSICIAN  AMB Provider Completed Forms Received 06/17/17 ORDERS [facility]  AMB Patient Logs/Info Received 06/20/17   AMB Provider Completed Forms Received 06/26/17 CARE INSTRUCTION FOR INSULIN DEP  AMB Provider Completed Forms Received 06/26/17 10/18 PHYSICIANS ORDER SHEEET [facility]  AMB Patient Logs/Info Received 07/11/17   AMB Correspondence  Received 06/30/17 ORDER [facility]  AMB Correspondence Received 07/11/17 ORDER [facility]  AMB Correspondence Received 07/23/17 SUMMARY OF BENEFITS AM[facility]  AMB Provider Completed Forms Received 07/23/17 Maida Sale [facility]  AMB HH/NH/Hospice Received 08/06/17 Vira Browns MD , K  AMB  Provider Completed Forms Received 08/29/17 ORDER [place] [facility]  AMB Correspondence Received 08/20/17 PHYS ORDER SHEET [facility]  AMB Patient Logs/Info Received 09/05/17   AMB Correspondence Received 09/05/17 ORDERS [facility]  AMB HH/NH/Hospice Received 08/12/17 CERTIFICATION/POC [facility]  Insurance Card Received 09/22/17 ROSM/Medicare and Medicaid  AMB HH/NH/Hospice Received 08/12/17 EPISODE DETAIL REPORT [facility]  AMB HH/NH/Hospice Received 09/27/17 ACCIDENT/INCIDENT REPORT [facility]  AMB Correspondence Received 09/24/17 ORDERS [facility]  AMB Patient Logs/Info Received 10/03/17   AMB Correspondence Received 09/22/17 ORDER  AMB Provider Completed Forms Received 10/20/17 [facility]  AMB HH/NH/Hospice Received 10/21/17 ORDER [facility]  AMB Correspondence Received 11/13/17 CMN CSC/ [place] TRACKS  AMB Provider Completed Forms Received 10/17/17 REPORT OF HEALTH SERVICES [place] DSS  AMB Provider Completed Forms Received 11/11/17 CARE INSTRUCTIONS [facility]  AMB Provider Completed Forms Received 11/18/17 [facility]  AMB Correspondence Received 11/21/17 ORDERS [facility]  AMB Correspondence Received 12/10/17 ORDER [facility]  AMB Provider Completed Forms Received 12/11/17 LETTER CONSULTANT PHARMACIST COMMUNICATION PHYSIC  [place] E-Signature HIPAA Notice of Privacy Signed 12/24/17 [facility] 2019  Release of Information Received 12/24/17 [facility]  AMB Correspondence Received 12/19/17 ORDERS [facility]  AMB Patient Logs/Info Received 01/01/18   AMB Correspondence Received 12/31/17 ORDERS [facility]  AMB Correspondence Received 12/31/17 ORDERS [facility]  AMB Patient Logs/Info Received 01/16/18   AMB Correspondence Received 02/09/18 SUMMARY OF BENEFITS  AMGEN  AMB Patient Logs/Info Received 02/17/18   AMB HH/NH/Hospice Received 02/18/18 ORDER [facility]  AMB Correspondence Received 03/05/18 ORDERS [facility]  Insurance Card Received 03/26/18 gna  AMB Provider Completed Forms Received 03/26/18 PHARMACIST COMMUNICATION TO PHYS [facility]  AMB Correspondence Received 03/23/18 ORDER TO [facility]  Release of Information Received 05/07/18   AMB Correspondence Received 05/21/18 ORDER TO [facility]  AMB HH/NH/Hospice Received 05/20/18 ACCIDENT/INCIDENT REPORT [facility]  AMB HH/NH/Hospice Received 05/30/18 DIET ORDERS [facility]  AMB Provider Completed Forms Received 06/08/18 CARE I NSTRUCTIONS FOR INSULIN DEPENDENT RESIDENT  AMB HH/NH/Hospice Received 06/09/18 ORDERS DIABETIC [place] [facility]  AMB Correspondence Received 06/19/18 EVALUATION [place] PT  AMB Correspondence Received 06/15/18 EVALUATION [place] PHYSICAL THERAPY  AMB HH/NH/Hospice Received 08/02/18 ACCIDENT/INCIDENT REPORT [facility]  AMB Patient Logs/Info Received 08/10/18   AMB Correspondence Received 08/31/18 ACKNOWLEDGEMENT [facility] ASSIST  AMB Provider Completed Forms Received 09/08/18 CARE INSTRUCTIONS FOR INSULIN [facility]  AMB HH/NH/Hospice Received 09/17/18 ORDER [facility]  AMB Provider Completed Forms Received 09/18/18 FL2/PHYSICIAN Desiree Hane MD, A  AMB Patient Logs/Info Received 09/21/18   [place] E-Signature HIPAA Notice of Privacy Signed 09/28/18 [facility]  AMB HH/NH/Hospice Received 16/10/96 HH CERT & POC [facility]  AMB HH/NH/Hospice Received 10/12/18 MISSED VISIT REPORT [facility]  AMB HH/NH/Hospice Received 10/14/18 REPORT OF BLOOD GLUCOSE READINGS REQUIRING PHYS NO  AMB HH/NH/Hospice Received 10/14/18 [facility]  AMB HH/NH/Hospice Received 10/20/18 ORDER [facility] [place]  AMB  HH/NH/Hospice Received  10/29/18 [facility]DERS [facility] [place]  AMB HH/NH/Hospice Received 10/21/18 ORDER FOR INCONTINENCE SUPPLIES [facility]  AMB HH/NH/Hospice Received 09/08/18 ORDER [facility]  AMB HH/NH/Hospice Received 11/03/18 [facility]  AMB HH/NH/Hospice Received 96/11/5401/28/20 HH CERT &PLAN OF CARE [facility]  AMB Patient Logs/Info Received 11/26/18   Insurance Card Received 12/04/18 Medicaid & New Medicare card 2020  AMB HH/NH/Hospice Received 12/10/18 D/S-TRANSFER SUMMARY REPORT [facility]  AMB Provider Completed Forms Received 12/24/18 DIET ORDERS/CARE INSTRUCTIONS FOR INSULIN DEPENDEN  AMB HH/NH/Hospice Received 12/24/18 PHYSICIANS ORDER SHEET [facility]  AMB Provider Completed Forms Received 12/17/18 CARE INSTRUCTION FOR INSULIN DEPENDENT RESIDENT  HIM ROI Authorization  12/30/18   AMB Outside Hospital Record Received 01/20/19 [facility]  AMB HH/NH/Hospice Received 02/05/19 HH CERTIFICATION & POC [facility]  AMB HH/NH/Hospice Received 02/05/19 ORDERS [facility]  AMB Provider Completed Forms Received 02/11/19 RX CARE  AMB Correspondence Not Received (Deleted)  10/13 referral [place] [facility]  AMB HH/NH/Hospice (Deleted) 08/21/13   AMB HH/NH/Hospice (Deleted) 09/07/13 [facility] PASRR Level 1 [facility]  AMB Provider Completed Forms (Deleted) 12/08/13 04/15 Order [facility]  AMB Correspondence (Deleted) 07/18/14 ORDER [facility]  AMB HH/NH/Hospice (Deleted) 09/23/14 CONSULTANT PHARMACIST COMMUNICATION MD [facility]  AMB Correspondence (Deleted) 12/17/14 ORDER [facility]  AMB Correspondence (Deleted) 01/05/15 ORDER [facility]  AMB Provider Completed Forms (Deleted) 01/05/15 ORDER [facility]  AMB HH/NH/Hospice (Deleted) 06/29/14 ORDER [place] DEPT. OF HUMAN RESOURCES DIVISION FACILIT  AMB Correspondence (Deleted) 11/16/15 CSC CMN/[place] TRACKS [facility]dalene Molly/[facility]  AMB  Provider Completed Forms (Deleted) 09/04/16 ORDER [facility]  AMB HH/NH/Hospice (Deleted) 09/16/16 ACCIDENT/INCIDENT REPORT [facility]  AMB Correspondence (Deleted) 10/16/16 ORDERS [place] [facility]  AMB Correspondence (Deleted) 11/07/16 ORDER  [facility]  AMB Correspondence (Deleted) 11/14/16 PHYSICIANS ORDER SHEET [facility]  AMB Correspondence (Deleted) 12/12/16 DIET ORDERS  AMB Provider Completed Forms (Deleted) 03/14/17 07/18 PHYS ORDER SHEET RX C[facility]  AMB Correspondence Received (Deleted) 06/26/17 10/18 PHYSICIANS ORDER SHEEET [facility]  AMB Correspondence Received (Deleted) 07/11/17 [facility]  HIM Release of Information Output (Deleted) 12/30/18 Requested records  Documents for the Encounter  AOB (Assignment of Insurance Benefits) Received 02/14/19 unable to enter room due to national emergency  E-signature AOB     MEDICARE RIGHTS Received 02/14/19 unable to enter room due to national emergency  E-signature Medicare Rights     ED Patient Billing Extract   ED PB Billing Extract  Cardiac Monitoring Strip Received 02/15/19   Cardiac Monitoring Strip Shift Summary Received 02/15/19   Ultrasound Received 02/15/19   HIM Release of Information Output   [id number]  EKG Received 02/15/19   Admission Information  Current Information  Attending Provider Admitting Provider Admission Type Admission Status  Shon HaleEmokpae, Courage, MD Pearson GrippeKim, James, MD Emergency Admission (Confirmed)       Admission Date/Time Discharge Date Hospital Service Auth/Cert Status  02/14/19 07:44 PM  Internal Medicine Incomplete       Hospital Area [facility]   [facility] A315/A315-01        Admission  Complaint  Emesis   Hospital Account  Name Acct ID Class Status Primary Coverage  Kristin Grant, [place] [id number] Inpatient Open MEDICARE - MEDICARE PART A AND B      Guarantor Account (for Hospital Account  [id number])  Name Relation to Pt Service Area Active? Acct Type  Wintle, Catalaya Self CHSA Yes Personal/Family  Address Phone  [facility]  [address]  [place], [place] [address] [phone])        Coverage Information (for Hospital Account [id number])  1. MEDICARE/MEDICARE PART A AND B  F/O Payor/Plan Precert #  MEDICARE/MEDICARE PART A AND B   Subscriber Subscriber #  Darnella, Zeiter [id number]  Address Phone  [address] [place], [place] [address]   2. MEDICAID [place]/MEDICAID [place] ACCESS  F/O Payor/Plan Precert #  MEDICAID [place]/MEDICAID [place] ACCESS   Subscriber Subscriber #  Maricela, [place] [id number]  Address Phone  [address]  [place], [place] [address] [phone]       Care Everywhere ID:  [id number]    SS # [phone]

## 2019-02-19 NOTE — TOC Transition Note (Signed)
Transition of Care [facility]) - CM/SW Discharge Note   Patient Details  Name: Kristin Grant MRN: [id number] Date of Birth: [birth year]  Transition of [facility]) CM/SW Contact:  Boneta Lucks, RN Phone Number: 02/19/2019, 3:44 PM   Clinical Narrative:   Patient was [facility] by [facility]. MD has discussed with Family. Report has been called, EMS arranged and Facility notified.    Final next level of care: Home w Hospice Care Barriers to Discharge: No Barriers Identified   Patient Goals and CMS Choice Patient states their goals for this hospitalization and ongoing recovery are:: Family decided on Hospice for comfort care. CMS Medicare.gov Compare Post Acute Care list provided to:: Patient Represenative (must comment)    Discharge Placement                Patient to be transferred to facility by: EMS Name of family member notified: SON Patient and family notified of of transfer: 02/19/19  Discharge Plan and Services   Discharge Planning Services: CM Consult   Readmission Risk Interventions Readmission Risk Prevention Plan 02/19/2019  Transportation Screening Complete  PCP or Specialist Appt within 3-5 Days Complete  Not Complete comments pt will be comfort care  [place] or [place] Not Complete  Social Work Consult for [place] Planning/Counseling Complete  Palliative Care Screening Complete  Medication Review Press photographer) Complete  Some recent data might be hidden

## 2019-02-21 LAB — CULTURE, BLOOD (ROUTINE X 2)
Culture: NO GROWTH
Culture: NO GROWTH
Special Requests: ADEQUATE

## 2019-02-25 ENCOUNTER — Inpatient Hospital Stay: Payer: Medicare Other | Admitting: Family Medicine

## 2019-03-27 DEATH — deceased

## 2019-03-29 ENCOUNTER — Other Ambulatory Visit: Payer: Self-pay

## 2020-09-19 IMAGING — DX DG LUMBAR SPINE COMPLETE 4+V
5 series · 5 of 5 positions shown · non-contrast
Comparison: 08/13/2013

CLINICAL DATA: Fall onto buttocks.  Hip pain.

EXAM:
LUMBAR SPINE - COMPLETE 4+ VIEW

[l-spine ap]
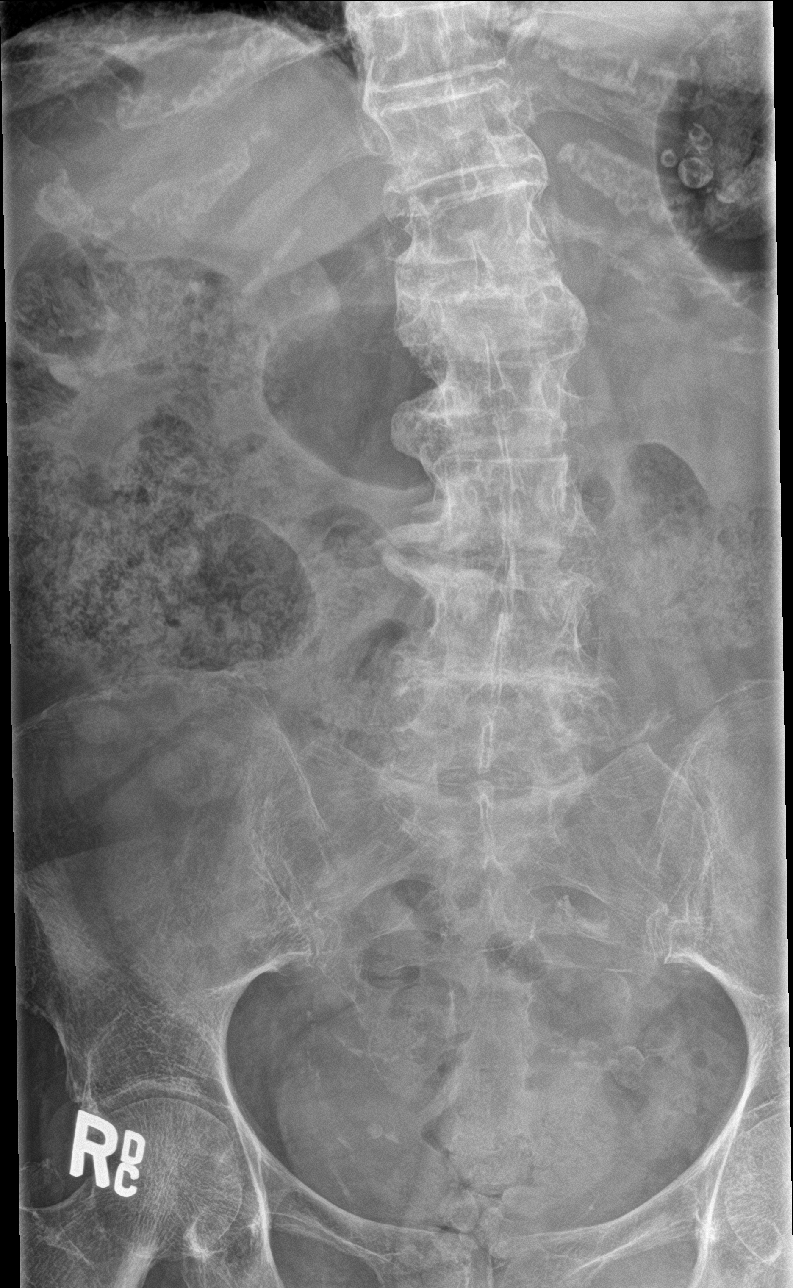

[l-spine obl (1 of 2)]
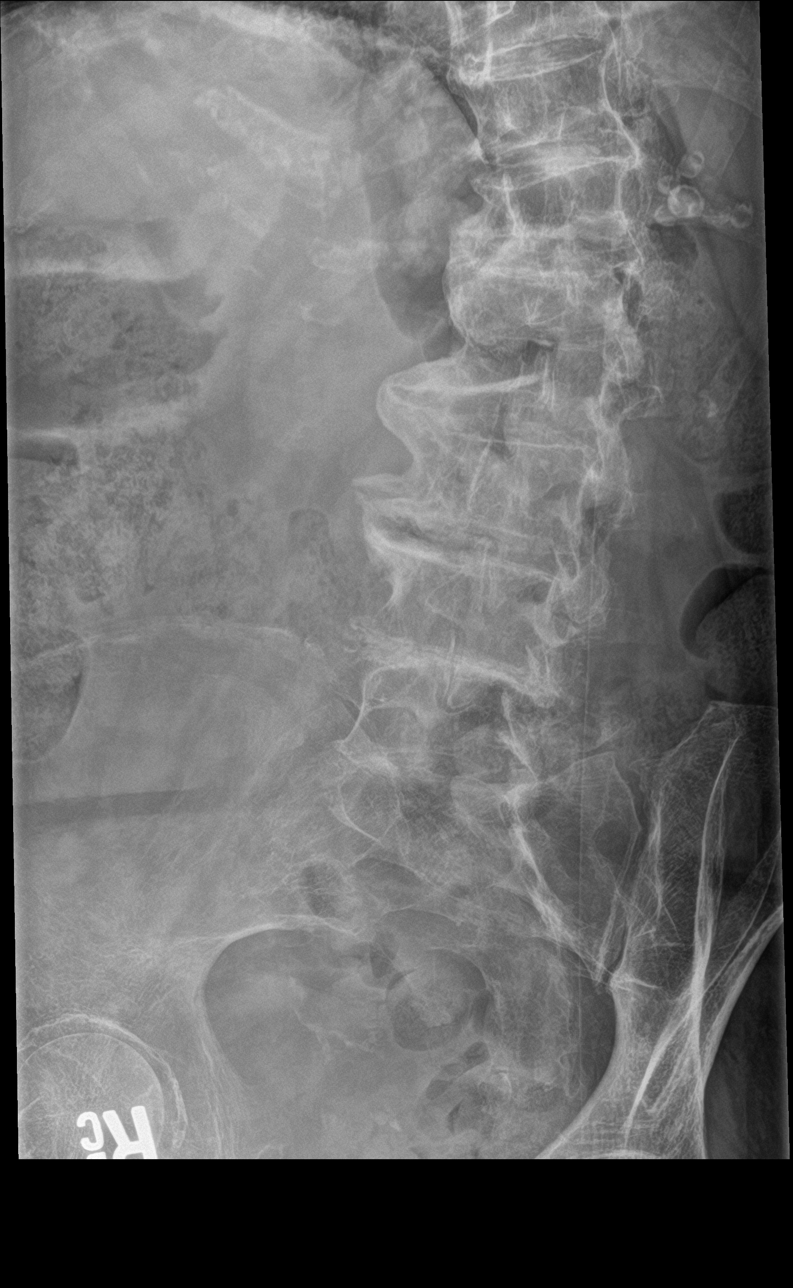

[l-spine obl (2 of 2)]
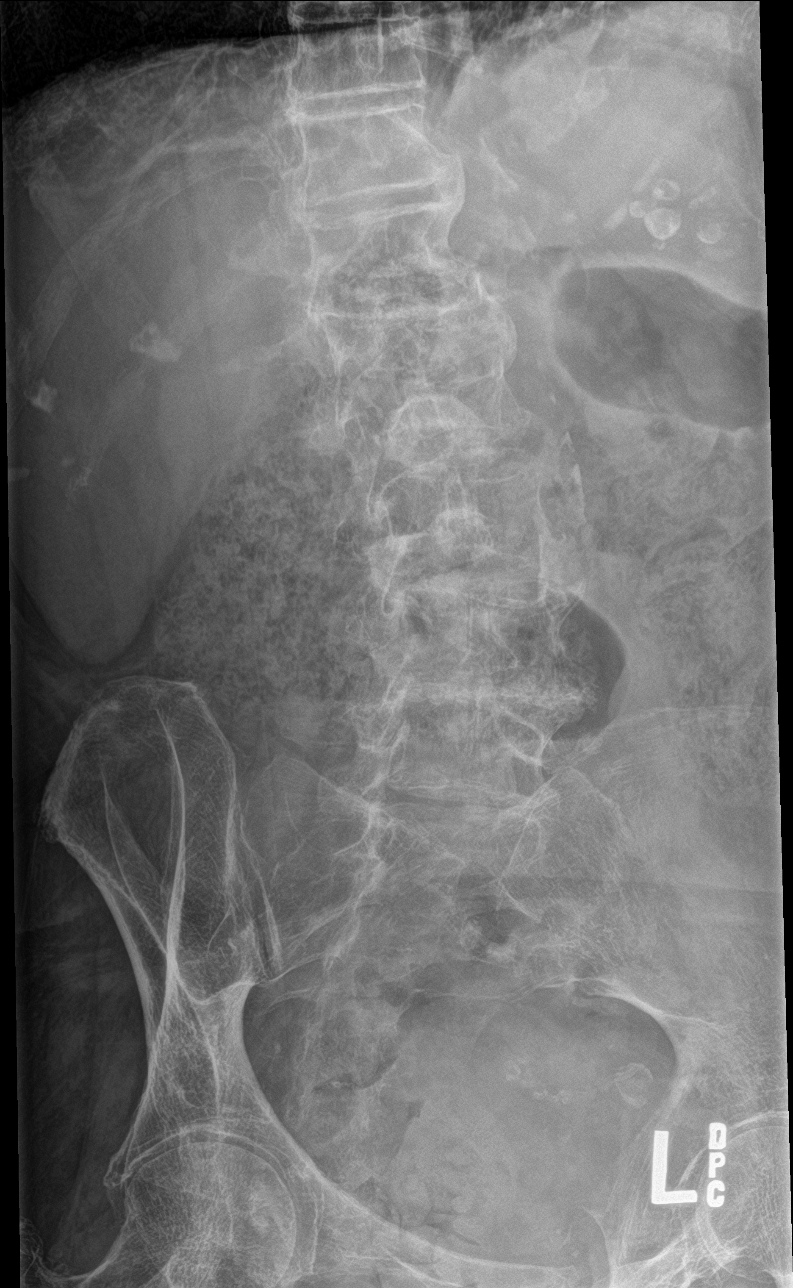

[l-spine lat]
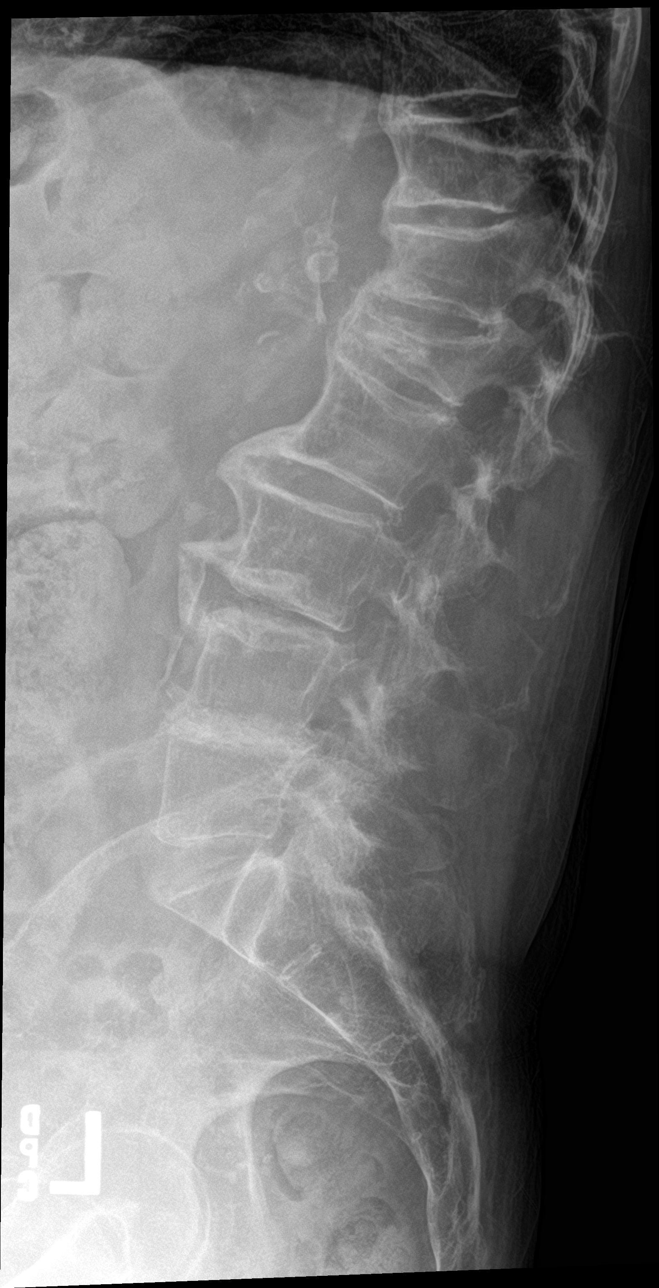

[l-spine spot]
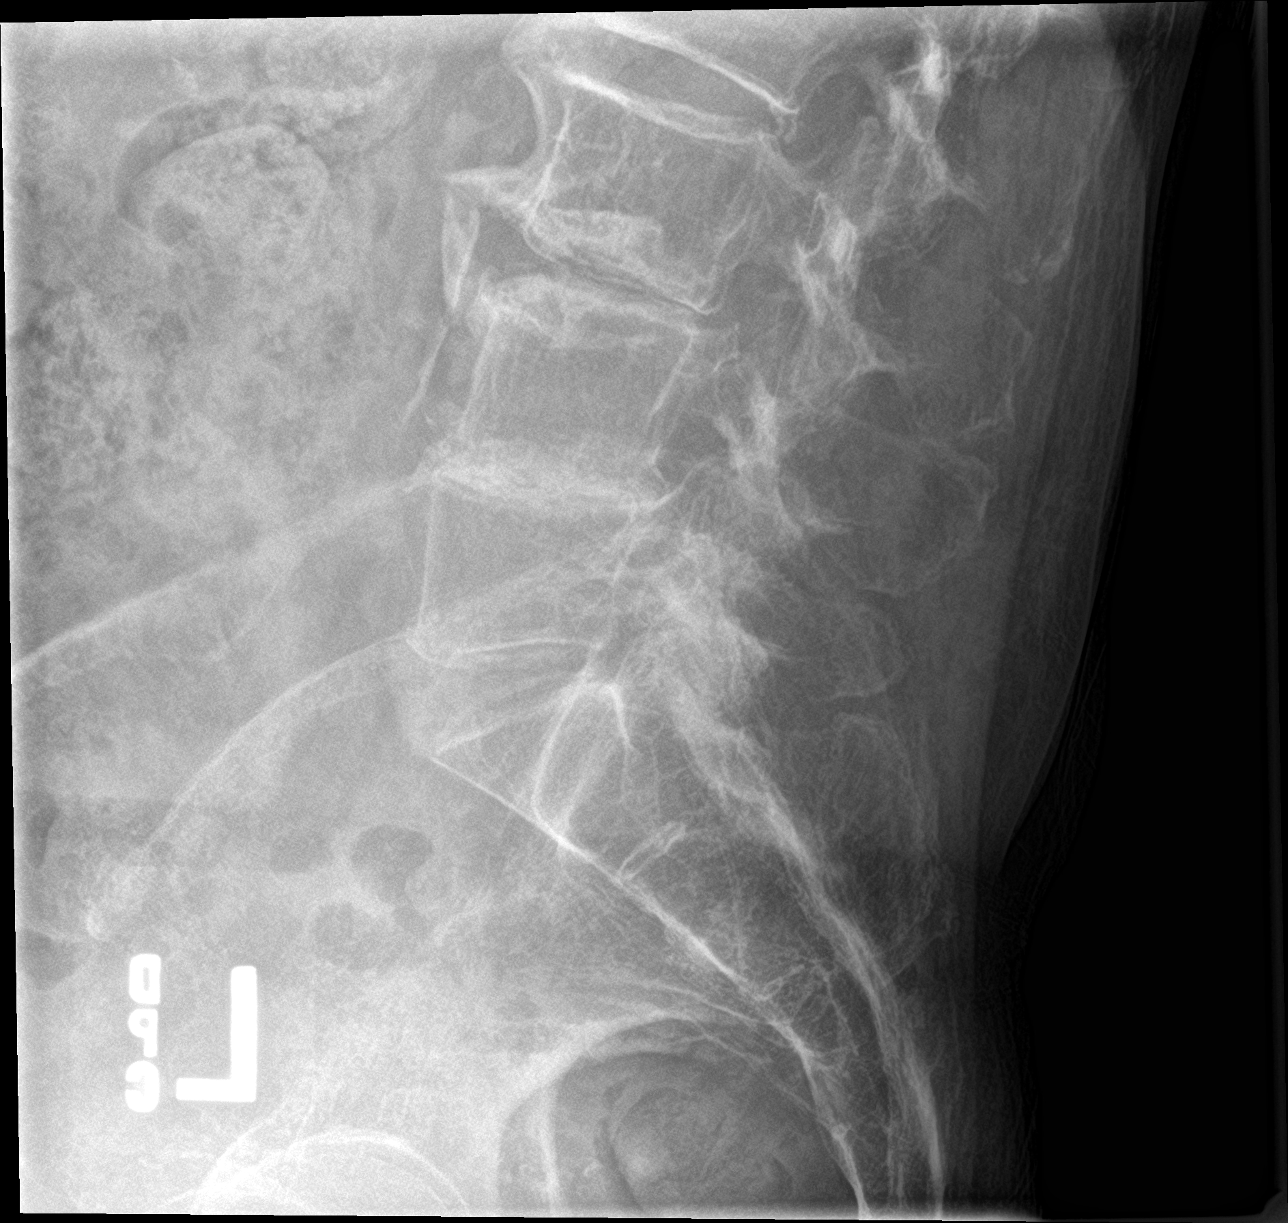

[5 of 5 positions shown; findings below may reference images not displayed]

FINDINGS: Diffuse advanced degenerative facet disease and disc disease
throughout the lumbar spine. Moderate L1 compression fracture is new
since [AGE] indeterminate. SI joints symmetric and unremarkable.
IMPRESSION: Moderate L1 compression fracture, age indeterminate, new since [age].

Advanced diffuse degenerative disc and facet disease.
